# Patient Record
Sex: Male | Born: 1945 | Race: White | Hispanic: No | Marital: Married | Smoking: Never smoker
Health system: Southern US, Community
[De-identification: ages and names within clinical notes are randomized; demographics above are authoritative.]

## PROBLEM LIST (undated history)

## (undated) DIAGNOSIS — G4730 Sleep apnea, unspecified: Secondary | ICD-10-CM

## (undated) DIAGNOSIS — J61 Pneumoconiosis due to asbestos and other mineral fibers: Secondary | ICD-10-CM

## (undated) DIAGNOSIS — I2510 Atherosclerotic heart disease of native coronary artery without angina pectoris: Secondary | ICD-10-CM

## (undated) DIAGNOSIS — M1990 Unspecified osteoarthritis, unspecified site: Secondary | ICD-10-CM

## (undated) DIAGNOSIS — I10 Essential (primary) hypertension: Secondary | ICD-10-CM

## (undated) DIAGNOSIS — N400 Enlarged prostate without lower urinary tract symptoms: Secondary | ICD-10-CM

## (undated) DIAGNOSIS — K219 Gastro-esophageal reflux disease without esophagitis: Secondary | ICD-10-CM

## (undated) DIAGNOSIS — C4359 Malignant melanoma of other part of trunk: Secondary | ICD-10-CM

## (undated) HISTORY — PX: KNEE CARTILAGE SURGERY: SHX688

## (undated) HISTORY — PX: MELANOMA EXCISION: SHX5266

## (undated) HISTORY — DX: Atherosclerotic heart disease of native coronary artery without angina pectoris: I25.10

## (undated) HISTORY — PX: ANTERIOR CERVICAL DECOMP/DISCECTOMY FUSION: SHX1161

## (undated) HISTORY — PX: KNEE ARTHROSCOPY: SHX127

## (undated) HISTORY — DX: Essential (primary) hypertension: I10

## (undated) HISTORY — DX: Benign prostatic hyperplasia without lower urinary tract symptoms: N40.0

## (undated) HISTORY — PX: SHOULDER OPEN ROTATOR CUFF REPAIR: SHX2407

---

## 2001-08-25 ENCOUNTER — Encounter: Payer: Self-pay | Admitting: Neurological Surgery

## 2001-08-25 ENCOUNTER — Ambulatory Visit (HOSPITAL_COMMUNITY): Admission: RE | Admit: 2001-08-25 | Discharge: 2001-08-25 | Payer: Self-pay | Admitting: Neurological Surgery

## 2002-01-30 ENCOUNTER — Encounter: Payer: Self-pay | Admitting: Neurological Surgery

## 2002-02-03 ENCOUNTER — Encounter: Payer: Self-pay | Admitting: Neurological Surgery

## 2002-02-03 ENCOUNTER — Inpatient Hospital Stay (HOSPITAL_COMMUNITY): Admission: RE | Admit: 2002-02-03 | Discharge: 2002-02-04 | Payer: Self-pay | Admitting: Neurological Surgery

## 2004-03-28 ENCOUNTER — Ambulatory Visit (HOSPITAL_COMMUNITY): Admission: RE | Admit: 2004-03-28 | Discharge: 2004-03-28 | Payer: Self-pay | Admitting: Family Medicine

## 2010-11-18 ENCOUNTER — Ambulatory Visit (HOSPITAL_COMMUNITY)
Admission: RE | Admit: 2010-11-18 | Discharge: 2010-11-18 | Payer: Self-pay | Source: Home / Self Care | Attending: Internal Medicine | Admitting: Internal Medicine

## 2010-12-02 ENCOUNTER — Ambulatory Visit (HOSPITAL_COMMUNITY)
Admission: RE | Admit: 2010-12-02 | Discharge: 2010-12-02 | Disposition: A | Discharge status: Home / Self Care | Payer: 59 | Source: Ambulatory Visit | Attending: Internal Medicine | Admitting: Internal Medicine

## 2010-12-02 DIAGNOSIS — R0989 Other specified symptoms and signs involving the circulatory and respiratory systems: Secondary | ICD-10-CM | POA: Insufficient documentation

## 2010-12-02 DIAGNOSIS — R0609 Other forms of dyspnea: Secondary | ICD-10-CM | POA: Insufficient documentation

## 2010-12-02 DIAGNOSIS — J61 Pneumoconiosis due to asbestos and other mineral fibers: Secondary | ICD-10-CM | POA: Insufficient documentation

## 2010-12-02 LAB — BLOOD GAS, ARTERIAL
Acid-Base Excess: 2.9 mmol/L — ABNORMAL HIGH (ref 0.0–2.0)
Bicarbonate: 27.3 mEq/L — ABNORMAL HIGH (ref 20.0–24.0)
O2 Saturation: 96.3 %
Patient temperature: 37
TCO2: 23.3 mmol/L (ref 0–100)
pCO2 arterial: 45.1 mmHg — ABNORMAL HIGH (ref 35.0–45.0)
pH, Arterial: 7.399 (ref 7.350–7.450)
pO2, Arterial: 83.4 mmHg (ref 80.0–100.0)

## 2010-12-30 ENCOUNTER — Ambulatory Visit (INDEPENDENT_AMBULATORY_CARE_PROVIDER_SITE_OTHER): Payer: 59 | Admitting: Urology

## 2010-12-30 DIAGNOSIS — E291 Testicular hypofunction: Secondary | ICD-10-CM

## 2010-12-30 DIAGNOSIS — N401 Enlarged prostate with lower urinary tract symptoms: Secondary | ICD-10-CM

## 2010-12-30 DIAGNOSIS — R609 Edema, unspecified: Secondary | ICD-10-CM

## 2011-02-17 ENCOUNTER — Ambulatory Visit (INDEPENDENT_AMBULATORY_CARE_PROVIDER_SITE_OTHER): Payer: 59 | Admitting: Urology

## 2011-02-17 DIAGNOSIS — N401 Enlarged prostate with lower urinary tract symptoms: Secondary | ICD-10-CM

## 2011-02-17 DIAGNOSIS — N529 Male erectile dysfunction, unspecified: Secondary | ICD-10-CM

## 2011-03-17 NOTE — Op Note (Signed)
[place]  Patient:    Devin [name], Devin [name] Visit Number: [id number] MRN: [id number]          Service Type: SUR Location: [facility] [id number] Attending Physician:  Jonne Ply Dictated by:   Stefani Dama, M.D. Proc. 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  OPERATION PERFORMED:  Anterior cervical diskectomy and arthrodesis, C3-4 and C4-5, structural allograft, Synthes fixation.  SURGEON:  Stefani Dama, M.D.  ASSISTANT:  Hewitt Shorts, M.D.  ANESTHESIA:  General endotracheal.  INDICATIONS FOR PROCEDURE:  The patient is a 65 year old individual who has had significant neck, shoulder and arm pain.  He has both Lhermittes phenomenon and dysesthetic sensation into the right shoulder and forearm.  He was found to have a soft disk herniation at C4-5 and severe spondylitic disease with bone ridge across the left side at C3-4.  He was advised regarding surgical decompression.  DESCRIPTION OF PROCEDURE:  The patient was brought to the operating room and placed on the table in supine position.  After smooth induction of general endotracheal anesthesia, he was placed in five pounds of halter traction.  The neck was prepped and draped with DuraPrep and then transverse incision was made in the superior, left-sided portion of the neck and carried down through the platysma.  The plane between the sternocleidomastoid and the strap muscles was dissected bluntly until the prevertebral space was reached.  The first identifiable disk space was noted to be that of C5-6 on a radiograph.  The dissection was then carried cephalad to expose C4-5 and C3-4.  The longus coli muscle was stripped on either side and the  self-retaining retractor caspar retractor was placed in the wound.  C3-4 was then opened ventrally removing some ventral osteophytic material.  The disk space was entered and a significant diskectomy was performed to the region of the posterior longitudinal ligament.  Here on the left side there was noted to be a large osteophytic growth both from the uncinate spur and from the inferior margin of the body of C3.  This was drilled down with a 2.3 mm dissecting tool and a high speed air drill.  On the right side a similar drilling was accomplished relieving an uncinate spur.  In the end, the posterior longitudinal ligament remained intact but the lateral recesses were widely dissected open. Hemostasis from some epidural veins which had poked through the lateral recesses was obtained with bipolar cautery and some small pledgets of Gelfoam soaked thrombin.  These were layer irrigated away and a 7 mm tricortical graft was placed with the cortical surface facing dorsally.  At C4-5 similar procedure was carried out; however, on the right side there was noted to be a fragment of disk that was herniated into the subligamentous space.  Removal of this disk fragment was accompanied by a significant epidural venous bleeding. This was controlled with pledgets of Gelfoam soaked in thrombin, again later irrigated away.  In the end decompression was ____________ to both lateral extremes.  7 mm tricuspid regurgitation cortical graft was placed in a similar fashion with a cortical surface facing dorsally.  The wound was irrigated copiously.  Traction was removed. The  neck was placed in slight flexion and then a 40 mm standard Synthes plate was contoured to the vertebral bodies anteriorly and affixed with six locking 4 x 14 mm screws.  Confirmation of the position of the plate and screws was obtained radiographically.  The wound was then irrigated copiously again and hemostasis in the soft tissues  was meticulously obtained.  The platysma was closed with 3-0 Vicryl in interrupted fashion.  3-0 Vicryl was used to close the subcuticular tissues.  The patient tolerated the procedure well and was returned to the recovery room in stable condition.Dictated by:   Stefani Dama, M.D. Attending Physician:  Jonne Ply DD:  02/03/02 TD:  02/03/02 Job: [id number]

## 2011-05-19 ENCOUNTER — Ambulatory Visit (INDEPENDENT_AMBULATORY_CARE_PROVIDER_SITE_OTHER): Payer: 59 | Admitting: Urology

## 2011-05-19 DIAGNOSIS — N529 Male erectile dysfunction, unspecified: Secondary | ICD-10-CM

## 2011-05-19 DIAGNOSIS — N401 Enlarged prostate with lower urinary tract symptoms: Secondary | ICD-10-CM

## 2011-05-19 DIAGNOSIS — E291 Testicular hypofunction: Secondary | ICD-10-CM

## 2011-08-25 ENCOUNTER — Ambulatory Visit (INDEPENDENT_AMBULATORY_CARE_PROVIDER_SITE_OTHER): Payer: Medicare Other | Admitting: Urology

## 2011-08-25 DIAGNOSIS — N401 Enlarged prostate with lower urinary tract symptoms: Secondary | ICD-10-CM

## 2011-11-24 ENCOUNTER — Ambulatory Visit (INDEPENDENT_AMBULATORY_CARE_PROVIDER_SITE_OTHER): Payer: Medicare Other | Admitting: Urology

## 2011-11-24 DIAGNOSIS — E291 Testicular hypofunction: Secondary | ICD-10-CM | POA: Diagnosis not present

## 2011-11-24 DIAGNOSIS — R609 Edema, unspecified: Secondary | ICD-10-CM | POA: Diagnosis not present

## 2011-11-24 DIAGNOSIS — N401 Enlarged prostate with lower urinary tract symptoms: Secondary | ICD-10-CM | POA: Diagnosis not present

## 2011-11-24 DIAGNOSIS — N529 Male erectile dysfunction, unspecified: Secondary | ICD-10-CM | POA: Diagnosis not present

## 2011-12-08 DIAGNOSIS — E291 Testicular hypofunction: Secondary | ICD-10-CM | POA: Diagnosis not present

## 2011-12-22 DIAGNOSIS — E291 Testicular hypofunction: Secondary | ICD-10-CM | POA: Diagnosis not present

## 2012-01-05 DIAGNOSIS — E291 Testicular hypofunction: Secondary | ICD-10-CM | POA: Diagnosis not present

## 2012-01-19 DIAGNOSIS — E291 Testicular hypofunction: Secondary | ICD-10-CM | POA: Diagnosis not present

## 2012-02-01 DIAGNOSIS — I10 Essential (primary) hypertension: Secondary | ICD-10-CM | POA: Diagnosis not present

## 2012-02-01 DIAGNOSIS — G5600 Carpal tunnel syndrome, unspecified upper limb: Secondary | ICD-10-CM | POA: Diagnosis not present

## 2012-02-09 DIAGNOSIS — E291 Testicular hypofunction: Secondary | ICD-10-CM | POA: Diagnosis not present

## 2012-02-23 DIAGNOSIS — E291 Testicular hypofunction: Secondary | ICD-10-CM | POA: Diagnosis not present

## 2012-03-04 DIAGNOSIS — J209 Acute bronchitis, unspecified: Secondary | ICD-10-CM | POA: Diagnosis not present

## 2012-03-04 DIAGNOSIS — R05 Cough: Secondary | ICD-10-CM | POA: Diagnosis not present

## 2012-03-04 DIAGNOSIS — Z6839 Body mass index (BMI) 39.0-39.9, adult: Secondary | ICD-10-CM | POA: Diagnosis not present

## 2012-03-04 DIAGNOSIS — Z719 Counseling, unspecified: Secondary | ICD-10-CM | POA: Diagnosis not present

## 2012-03-08 DIAGNOSIS — E291 Testicular hypofunction: Secondary | ICD-10-CM | POA: Diagnosis not present

## 2012-03-15 DIAGNOSIS — Z79899 Other long term (current) drug therapy: Secondary | ICD-10-CM | POA: Diagnosis not present

## 2012-03-15 DIAGNOSIS — I10 Essential (primary) hypertension: Secondary | ICD-10-CM | POA: Diagnosis not present

## 2012-03-20 DIAGNOSIS — I10 Essential (primary) hypertension: Secondary | ICD-10-CM | POA: Diagnosis not present

## 2012-03-22 DIAGNOSIS — E290 Testicular hyperfunction: Secondary | ICD-10-CM | POA: Diagnosis not present

## 2012-04-05 DIAGNOSIS — E291 Testicular hypofunction: Secondary | ICD-10-CM | POA: Diagnosis not present

## 2012-04-29 DIAGNOSIS — N529 Male erectile dysfunction, unspecified: Secondary | ICD-10-CM | POA: Diagnosis not present

## 2012-05-10 DIAGNOSIS — N529 Male erectile dysfunction, unspecified: Secondary | ICD-10-CM | POA: Diagnosis not present

## 2012-05-15 DIAGNOSIS — E291 Testicular hypofunction: Secondary | ICD-10-CM | POA: Diagnosis not present

## 2012-05-24 ENCOUNTER — Ambulatory Visit (INDEPENDENT_AMBULATORY_CARE_PROVIDER_SITE_OTHER): Payer: Medicare Other | Admitting: Urology

## 2012-05-24 DIAGNOSIS — N401 Enlarged prostate with lower urinary tract symptoms: Secondary | ICD-10-CM

## 2012-05-24 DIAGNOSIS — E291 Testicular hypofunction: Secondary | ICD-10-CM

## 2012-05-24 DIAGNOSIS — N529 Male erectile dysfunction, unspecified: Secondary | ICD-10-CM | POA: Diagnosis not present

## 2012-05-31 DIAGNOSIS — N529 Male erectile dysfunction, unspecified: Secondary | ICD-10-CM | POA: Diagnosis not present

## 2012-06-17 DIAGNOSIS — N529 Male erectile dysfunction, unspecified: Secondary | ICD-10-CM | POA: Diagnosis not present

## 2012-07-02 DIAGNOSIS — N529 Male erectile dysfunction, unspecified: Secondary | ICD-10-CM | POA: Diagnosis not present

## 2012-07-16 DIAGNOSIS — N529 Male erectile dysfunction, unspecified: Secondary | ICD-10-CM | POA: Diagnosis not present

## 2012-07-25 DIAGNOSIS — I10 Essential (primary) hypertension: Secondary | ICD-10-CM | POA: Diagnosis not present

## 2012-07-25 DIAGNOSIS — J209 Acute bronchitis, unspecified: Secondary | ICD-10-CM | POA: Diagnosis not present

## 2012-07-25 DIAGNOSIS — Z23 Encounter for immunization: Secondary | ICD-10-CM | POA: Diagnosis not present

## 2012-07-30 DIAGNOSIS — N529 Male erectile dysfunction, unspecified: Secondary | ICD-10-CM | POA: Diagnosis not present

## 2012-08-21 DIAGNOSIS — N529 Male erectile dysfunction, unspecified: Secondary | ICD-10-CM | POA: Diagnosis not present

## 2012-09-04 DIAGNOSIS — Z79899 Other long term (current) drug therapy: Secondary | ICD-10-CM | POA: Diagnosis not present

## 2012-09-11 DIAGNOSIS — J209 Acute bronchitis, unspecified: Secondary | ICD-10-CM | POA: Diagnosis not present

## 2012-09-19 DIAGNOSIS — N529 Male erectile dysfunction, unspecified: Secondary | ICD-10-CM | POA: Diagnosis not present

## 2012-10-03 DIAGNOSIS — N529 Male erectile dysfunction, unspecified: Secondary | ICD-10-CM | POA: Diagnosis not present

## 2012-10-17 DIAGNOSIS — N529 Male erectile dysfunction, unspecified: Secondary | ICD-10-CM | POA: Diagnosis not present

## 2012-10-31 DIAGNOSIS — H66009 Acute suppurative otitis media without spontaneous rupture of ear drum, unspecified ear: Secondary | ICD-10-CM | POA: Diagnosis not present

## 2012-11-06 DIAGNOSIS — E291 Testicular hypofunction: Secondary | ICD-10-CM | POA: Diagnosis not present

## 2012-11-07 DIAGNOSIS — N529 Male erectile dysfunction, unspecified: Secondary | ICD-10-CM | POA: Diagnosis not present

## 2012-11-21 DIAGNOSIS — N529 Male erectile dysfunction, unspecified: Secondary | ICD-10-CM | POA: Diagnosis not present

## 2012-11-25 DIAGNOSIS — I10 Essential (primary) hypertension: Secondary | ICD-10-CM | POA: Diagnosis not present

## 2012-11-25 DIAGNOSIS — Z79899 Other long term (current) drug therapy: Secondary | ICD-10-CM | POA: Diagnosis not present

## 2012-11-25 DIAGNOSIS — E559 Vitamin D deficiency, unspecified: Secondary | ICD-10-CM | POA: Diagnosis not present

## 2012-12-02 DIAGNOSIS — I10 Essential (primary) hypertension: Secondary | ICD-10-CM | POA: Diagnosis not present

## 2012-12-02 DIAGNOSIS — G4700 Insomnia, unspecified: Secondary | ICD-10-CM | POA: Diagnosis not present

## 2012-12-05 DIAGNOSIS — N529 Male erectile dysfunction, unspecified: Secondary | ICD-10-CM | POA: Diagnosis not present

## 2012-12-19 DIAGNOSIS — N529 Male erectile dysfunction, unspecified: Secondary | ICD-10-CM | POA: Diagnosis not present

## 2012-12-27 ENCOUNTER — Ambulatory Visit (INDEPENDENT_AMBULATORY_CARE_PROVIDER_SITE_OTHER): Payer: Medicare Other | Admitting: Urology

## 2012-12-27 DIAGNOSIS — N529 Male erectile dysfunction, unspecified: Secondary | ICD-10-CM

## 2012-12-27 DIAGNOSIS — N401 Enlarged prostate with lower urinary tract symptoms: Secondary | ICD-10-CM | POA: Diagnosis not present

## 2012-12-27 DIAGNOSIS — E291 Testicular hypofunction: Secondary | ICD-10-CM

## 2013-01-02 DIAGNOSIS — N529 Male erectile dysfunction, unspecified: Secondary | ICD-10-CM | POA: Diagnosis not present

## 2013-01-06 DIAGNOSIS — N529 Male erectile dysfunction, unspecified: Secondary | ICD-10-CM | POA: Diagnosis not present

## 2013-01-20 DIAGNOSIS — N529 Male erectile dysfunction, unspecified: Secondary | ICD-10-CM | POA: Diagnosis not present

## 2013-02-03 DIAGNOSIS — N529 Male erectile dysfunction, unspecified: Secondary | ICD-10-CM | POA: Diagnosis not present

## 2013-02-17 DIAGNOSIS — N529 Male erectile dysfunction, unspecified: Secondary | ICD-10-CM | POA: Diagnosis not present

## 2013-03-03 DIAGNOSIS — N529 Male erectile dysfunction, unspecified: Secondary | ICD-10-CM | POA: Diagnosis not present

## 2013-03-17 DIAGNOSIS — N529 Male erectile dysfunction, unspecified: Secondary | ICD-10-CM | POA: Diagnosis not present

## 2013-04-04 DIAGNOSIS — J209 Acute bronchitis, unspecified: Secondary | ICD-10-CM | POA: Diagnosis not present

## 2013-04-04 DIAGNOSIS — I10 Essential (primary) hypertension: Secondary | ICD-10-CM | POA: Diagnosis not present

## 2013-04-04 DIAGNOSIS — N529 Male erectile dysfunction, unspecified: Secondary | ICD-10-CM | POA: Diagnosis not present

## 2013-04-04 DIAGNOSIS — R609 Edema, unspecified: Secondary | ICD-10-CM | POA: Diagnosis not present

## 2013-04-18 DIAGNOSIS — N529 Male erectile dysfunction, unspecified: Secondary | ICD-10-CM | POA: Diagnosis not present

## 2013-05-26 DIAGNOSIS — N529 Male erectile dysfunction, unspecified: Secondary | ICD-10-CM | POA: Diagnosis not present

## 2013-06-09 DIAGNOSIS — N529 Male erectile dysfunction, unspecified: Secondary | ICD-10-CM | POA: Diagnosis not present

## 2013-06-13 DIAGNOSIS — N401 Enlarged prostate with lower urinary tract symptoms: Secondary | ICD-10-CM | POA: Diagnosis not present

## 2013-06-13 DIAGNOSIS — E291 Testicular hypofunction: Secondary | ICD-10-CM | POA: Diagnosis not present

## 2013-06-13 DIAGNOSIS — N529 Male erectile dysfunction, unspecified: Secondary | ICD-10-CM | POA: Diagnosis not present

## 2013-06-23 DIAGNOSIS — N401 Enlarged prostate with lower urinary tract symptoms: Secondary | ICD-10-CM | POA: Diagnosis not present

## 2013-06-23 DIAGNOSIS — N529 Male erectile dysfunction, unspecified: Secondary | ICD-10-CM | POA: Diagnosis not present

## 2013-06-27 ENCOUNTER — Ambulatory Visit (INDEPENDENT_AMBULATORY_CARE_PROVIDER_SITE_OTHER): Payer: Medicare Other | Admitting: Urology

## 2013-06-27 DIAGNOSIS — E291 Testicular hypofunction: Secondary | ICD-10-CM

## 2013-06-27 DIAGNOSIS — R6882 Decreased libido: Secondary | ICD-10-CM

## 2013-06-27 DIAGNOSIS — N529 Male erectile dysfunction, unspecified: Secondary | ICD-10-CM | POA: Diagnosis not present

## 2013-06-27 DIAGNOSIS — N401 Enlarged prostate with lower urinary tract symptoms: Secondary | ICD-10-CM | POA: Diagnosis not present

## 2013-07-07 DIAGNOSIS — N529 Male erectile dysfunction, unspecified: Secondary | ICD-10-CM | POA: Diagnosis not present

## 2013-07-21 DIAGNOSIS — N529 Male erectile dysfunction, unspecified: Secondary | ICD-10-CM | POA: Diagnosis not present

## 2013-08-08 DIAGNOSIS — N529 Male erectile dysfunction, unspecified: Secondary | ICD-10-CM | POA: Diagnosis not present

## 2013-08-08 DIAGNOSIS — I10 Essential (primary) hypertension: Secondary | ICD-10-CM | POA: Diagnosis not present

## 2013-08-08 DIAGNOSIS — R609 Edema, unspecified: Secondary | ICD-10-CM | POA: Diagnosis not present

## 2013-08-29 DIAGNOSIS — N529 Male erectile dysfunction, unspecified: Secondary | ICD-10-CM | POA: Diagnosis not present

## 2013-09-15 DIAGNOSIS — N529 Male erectile dysfunction, unspecified: Secondary | ICD-10-CM | POA: Diagnosis not present

## 2013-09-29 DIAGNOSIS — N529 Male erectile dysfunction, unspecified: Secondary | ICD-10-CM | POA: Diagnosis not present

## 2013-10-02 DIAGNOSIS — J441 Chronic obstructive pulmonary disease with (acute) exacerbation: Secondary | ICD-10-CM | POA: Diagnosis not present

## 2013-10-13 DIAGNOSIS — N529 Male erectile dysfunction, unspecified: Secondary | ICD-10-CM | POA: Diagnosis not present

## 2013-10-28 DIAGNOSIS — N529 Male erectile dysfunction, unspecified: Secondary | ICD-10-CM | POA: Diagnosis not present

## 2013-11-11 DIAGNOSIS — N529 Male erectile dysfunction, unspecified: Secondary | ICD-10-CM | POA: Diagnosis not present

## 2013-11-25 DIAGNOSIS — N529 Male erectile dysfunction, unspecified: Secondary | ICD-10-CM | POA: Diagnosis not present

## 2013-12-05 DIAGNOSIS — Z79899 Other long term (current) drug therapy: Secondary | ICD-10-CM | POA: Diagnosis not present

## 2013-12-05 DIAGNOSIS — E785 Hyperlipidemia, unspecified: Secondary | ICD-10-CM | POA: Diagnosis not present

## 2013-12-05 DIAGNOSIS — I10 Essential (primary) hypertension: Secondary | ICD-10-CM | POA: Diagnosis not present

## 2013-12-09 DIAGNOSIS — N529 Male erectile dysfunction, unspecified: Secondary | ICD-10-CM | POA: Diagnosis not present

## 2013-12-12 DIAGNOSIS — I10 Essential (primary) hypertension: Secondary | ICD-10-CM | POA: Diagnosis not present

## 2013-12-12 DIAGNOSIS — E785 Hyperlipidemia, unspecified: Secondary | ICD-10-CM | POA: Diagnosis not present

## 2013-12-12 DIAGNOSIS — J209 Acute bronchitis, unspecified: Secondary | ICD-10-CM | POA: Diagnosis not present

## 2013-12-23 DIAGNOSIS — N529 Male erectile dysfunction, unspecified: Secondary | ICD-10-CM | POA: Diagnosis not present

## 2014-01-08 DIAGNOSIS — N529 Male erectile dysfunction, unspecified: Secondary | ICD-10-CM | POA: Diagnosis not present

## 2014-01-22 DIAGNOSIS — N529 Male erectile dysfunction, unspecified: Secondary | ICD-10-CM | POA: Diagnosis not present

## 2014-02-05 DIAGNOSIS — N529 Male erectile dysfunction, unspecified: Secondary | ICD-10-CM | POA: Diagnosis not present

## 2014-02-23 DIAGNOSIS — N529 Male erectile dysfunction, unspecified: Secondary | ICD-10-CM | POA: Diagnosis not present

## 2014-03-16 DIAGNOSIS — N529 Male erectile dysfunction, unspecified: Secondary | ICD-10-CM | POA: Diagnosis not present

## 2014-03-30 DIAGNOSIS — N529 Male erectile dysfunction, unspecified: Secondary | ICD-10-CM | POA: Diagnosis not present

## 2014-04-13 DIAGNOSIS — N529 Male erectile dysfunction, unspecified: Secondary | ICD-10-CM | POA: Diagnosis not present

## 2014-04-14 DIAGNOSIS — J61 Pneumoconiosis due to asbestos and other mineral fibers: Secondary | ICD-10-CM | POA: Diagnosis not present

## 2014-04-14 DIAGNOSIS — I10 Essential (primary) hypertension: Secondary | ICD-10-CM | POA: Diagnosis not present

## 2014-04-14 DIAGNOSIS — Z23 Encounter for immunization: Secondary | ICD-10-CM | POA: Diagnosis not present

## 2014-04-27 DIAGNOSIS — N529 Male erectile dysfunction, unspecified: Secondary | ICD-10-CM | POA: Diagnosis not present

## 2014-05-12 DIAGNOSIS — N529 Male erectile dysfunction, unspecified: Secondary | ICD-10-CM | POA: Diagnosis not present

## 2014-05-26 DIAGNOSIS — N529 Male erectile dysfunction, unspecified: Secondary | ICD-10-CM | POA: Diagnosis not present

## 2014-06-09 DIAGNOSIS — R972 Elevated prostate specific antigen [PSA]: Secondary | ICD-10-CM | POA: Diagnosis not present

## 2014-06-09 DIAGNOSIS — E291 Testicular hypofunction: Secondary | ICD-10-CM | POA: Diagnosis not present

## 2014-06-09 DIAGNOSIS — N529 Male erectile dysfunction, unspecified: Secondary | ICD-10-CM | POA: Diagnosis not present

## 2014-06-12 ENCOUNTER — Ambulatory Visit (INDEPENDENT_AMBULATORY_CARE_PROVIDER_SITE_OTHER): Payer: Medicare Other | Admitting: Urology

## 2014-06-12 DIAGNOSIS — E291 Testicular hypofunction: Secondary | ICD-10-CM

## 2014-06-12 DIAGNOSIS — N529 Male erectile dysfunction, unspecified: Secondary | ICD-10-CM

## 2014-06-12 DIAGNOSIS — N401 Enlarged prostate with lower urinary tract symptoms: Secondary | ICD-10-CM

## 2014-06-23 DIAGNOSIS — N529 Male erectile dysfunction, unspecified: Secondary | ICD-10-CM | POA: Diagnosis not present

## 2014-07-07 DIAGNOSIS — N529 Male erectile dysfunction, unspecified: Secondary | ICD-10-CM | POA: Diagnosis not present

## 2014-07-21 DIAGNOSIS — N529 Male erectile dysfunction, unspecified: Secondary | ICD-10-CM | POA: Diagnosis not present

## 2014-08-10 DIAGNOSIS — N529 Male erectile dysfunction, unspecified: Secondary | ICD-10-CM | POA: Diagnosis not present

## 2014-08-10 DIAGNOSIS — J209 Acute bronchitis, unspecified: Secondary | ICD-10-CM | POA: Diagnosis not present

## 2014-08-10 DIAGNOSIS — Z23 Encounter for immunization: Secondary | ICD-10-CM | POA: Diagnosis not present

## 2014-08-14 ENCOUNTER — Ambulatory Visit: Payer: Medicare Other | Admitting: Urology

## 2014-08-21 DIAGNOSIS — I10 Essential (primary) hypertension: Secondary | ICD-10-CM | POA: Diagnosis not present

## 2014-08-24 DIAGNOSIS — N529 Male erectile dysfunction, unspecified: Secondary | ICD-10-CM | POA: Diagnosis not present

## 2014-09-10 DIAGNOSIS — N529 Male erectile dysfunction, unspecified: Secondary | ICD-10-CM | POA: Diagnosis not present

## 2014-09-28 DIAGNOSIS — N529 Male erectile dysfunction, unspecified: Secondary | ICD-10-CM | POA: Diagnosis not present

## 2014-10-02 DIAGNOSIS — E291 Testicular hypofunction: Secondary | ICD-10-CM | POA: Diagnosis not present

## 2014-10-09 ENCOUNTER — Ambulatory Visit (INDEPENDENT_AMBULATORY_CARE_PROVIDER_SITE_OTHER): Payer: Medicare Other | Admitting: Urology

## 2014-10-09 DIAGNOSIS — N5201 Erectile dysfunction due to arterial insufficiency: Secondary | ICD-10-CM | POA: Diagnosis not present

## 2014-10-09 DIAGNOSIS — N401 Enlarged prostate with lower urinary tract symptoms: Secondary | ICD-10-CM

## 2014-10-09 DIAGNOSIS — E291 Testicular hypofunction: Secondary | ICD-10-CM

## 2014-10-09 DIAGNOSIS — R3912 Poor urinary stream: Secondary | ICD-10-CM

## 2014-10-12 DIAGNOSIS — N529 Male erectile dysfunction, unspecified: Secondary | ICD-10-CM | POA: Diagnosis not present

## 2014-10-26 DIAGNOSIS — N529 Male erectile dysfunction, unspecified: Secondary | ICD-10-CM | POA: Diagnosis not present

## 2014-11-09 DIAGNOSIS — N529 Male erectile dysfunction, unspecified: Secondary | ICD-10-CM | POA: Diagnosis not present

## 2014-11-23 DIAGNOSIS — N529 Male erectile dysfunction, unspecified: Secondary | ICD-10-CM | POA: Diagnosis not present

## 2014-12-07 DIAGNOSIS — N529 Male erectile dysfunction, unspecified: Secondary | ICD-10-CM | POA: Diagnosis not present

## 2014-12-21 DIAGNOSIS — N529 Male erectile dysfunction, unspecified: Secondary | ICD-10-CM | POA: Diagnosis not present

## 2014-12-25 ENCOUNTER — Inpatient Hospital Stay (HOSPITAL_COMMUNITY)
Admission: EM | Admit: 2014-12-25 | Discharge: 2015-01-03 | DRG: 234 | Disposition: A | Discharge status: Home / Self Care | Payer: Medicare Other | Attending: Cardiothoracic Surgery | Admitting: Cardiothoracic Surgery

## 2014-12-25 ENCOUNTER — Encounter (HOSPITAL_COMMUNITY): Payer: Self-pay | Admitting: Emergency Medicine

## 2014-12-25 ENCOUNTER — Emergency Department (HOSPITAL_COMMUNITY): Payer: Medicare Other

## 2014-12-25 DIAGNOSIS — E8770 Fluid overload, unspecified: Secondary | ICD-10-CM | POA: Diagnosis present

## 2014-12-25 DIAGNOSIS — I2111 ST elevation (STEMI) myocardial infarction involving right coronary artery: Secondary | ICD-10-CM | POA: Diagnosis not present

## 2014-12-25 DIAGNOSIS — Z951 Presence of aortocoronary bypass graft: Secondary | ICD-10-CM

## 2014-12-25 DIAGNOSIS — I517 Cardiomegaly: Secondary | ICD-10-CM | POA: Diagnosis not present

## 2014-12-25 DIAGNOSIS — J90 Pleural effusion, not elsewhere classified: Secondary | ICD-10-CM | POA: Diagnosis not present

## 2014-12-25 DIAGNOSIS — E785 Hyperlipidemia, unspecified: Secondary | ICD-10-CM | POA: Diagnosis present

## 2014-12-25 DIAGNOSIS — M1990 Unspecified osteoarthritis, unspecified site: Secondary | ICD-10-CM | POA: Diagnosis present

## 2014-12-25 DIAGNOSIS — G4730 Sleep apnea, unspecified: Secondary | ICD-10-CM | POA: Diagnosis present

## 2014-12-25 DIAGNOSIS — Z79899 Other long term (current) drug therapy: Secondary | ICD-10-CM

## 2014-12-25 DIAGNOSIS — Z981 Arthrodesis status: Secondary | ICD-10-CM

## 2014-12-25 DIAGNOSIS — Z77090 Contact with and (suspected) exposure to asbestos: Secondary | ICD-10-CM | POA: Diagnosis present

## 2014-12-25 DIAGNOSIS — I214 Non-ST elevation (NSTEMI) myocardial infarction: Secondary | ICD-10-CM | POA: Diagnosis not present

## 2014-12-25 DIAGNOSIS — I4510 Unspecified right bundle-branch block: Secondary | ICD-10-CM | POA: Diagnosis present

## 2014-12-25 DIAGNOSIS — L259 Unspecified contact dermatitis, unspecified cause: Secondary | ICD-10-CM | POA: Diagnosis not present

## 2014-12-25 DIAGNOSIS — I25110 Atherosclerotic heart disease of native coronary artery with unstable angina pectoris: Secondary | ICD-10-CM | POA: Diagnosis present

## 2014-12-25 DIAGNOSIS — E669 Obesity, unspecified: Secondary | ICD-10-CM | POA: Diagnosis present

## 2014-12-25 DIAGNOSIS — I10 Essential (primary) hypertension: Secondary | ICD-10-CM | POA: Diagnosis present

## 2014-12-25 DIAGNOSIS — K219 Gastro-esophageal reflux disease without esophagitis: Secondary | ICD-10-CM | POA: Diagnosis present

## 2014-12-25 DIAGNOSIS — I2510 Atherosclerotic heart disease of native coronary artery without angina pectoris: Secondary | ICD-10-CM | POA: Diagnosis not present

## 2014-12-25 DIAGNOSIS — R079 Chest pain, unspecified: Secondary | ICD-10-CM | POA: Diagnosis not present

## 2014-12-25 DIAGNOSIS — J984 Other disorders of lung: Secondary | ICD-10-CM | POA: Diagnosis present

## 2014-12-25 DIAGNOSIS — Z4682 Encounter for fitting and adjustment of non-vascular catheter: Secondary | ICD-10-CM | POA: Diagnosis not present

## 2014-12-25 DIAGNOSIS — R0902 Hypoxemia: Secondary | ICD-10-CM | POA: Diagnosis not present

## 2014-12-25 DIAGNOSIS — R0989 Other specified symptoms and signs involving the circulatory and respiratory systems: Secondary | ICD-10-CM | POA: Diagnosis not present

## 2014-12-25 DIAGNOSIS — J9811 Atelectasis: Secondary | ICD-10-CM | POA: Diagnosis not present

## 2014-12-25 DIAGNOSIS — R072 Precordial pain: Secondary | ICD-10-CM | POA: Diagnosis not present

## 2014-12-25 DIAGNOSIS — I200 Unstable angina: Secondary | ICD-10-CM | POA: Diagnosis not present

## 2014-12-25 HISTORY — DX: Pneumoconiosis due to asbestos and other mineral fibers: J61

## 2014-12-25 HISTORY — DX: Gastro-esophageal reflux disease without esophagitis: K21.9

## 2014-12-25 HISTORY — DX: Sleep apnea, unspecified: G47.30

## 2014-12-25 HISTORY — DX: Malignant melanoma of other part of trunk: C43.59

## 2014-12-25 HISTORY — DX: Unspecified osteoarthritis, unspecified site: M19.90

## 2014-12-25 LAB — CBC WITH DIFFERENTIAL/PLATELET
BASOS PCT: 0 % (ref 0–1)
Basophils Absolute: 0 10*3/uL (ref 0.0–0.1)
EOS PCT: 3 % (ref 0–5)
Eosinophils Absolute: 0.2 10*3/uL (ref 0.0–0.7)
HCT: 47.9 % (ref 39.0–52.0)
Hemoglobin: 15.7 g/dL (ref 13.0–17.0)
LYMPHS ABS: 1.2 10*3/uL (ref 0.7–4.0)
Lymphocytes Relative: 24 % (ref 12–46)
MCH: 31.9 pg (ref 26.0–34.0)
MCHC: 32.8 g/dL (ref 30.0–36.0)
MCV: 97.4 fL (ref 78.0–100.0)
MONOS PCT: 8 % (ref 3–12)
Monocytes Absolute: 0.4 10*3/uL (ref 0.1–1.0)
Neutro Abs: 3.4 10*3/uL (ref 1.7–7.7)
Neutrophils Relative %: 65 % (ref 43–77)
Platelets: 157 10*3/uL (ref 150–400)
RBC: 4.92 MIL/uL (ref 4.22–5.81)
RDW: 13.5 % (ref 11.5–15.5)
WBC: 5.2 10*3/uL (ref 4.0–10.5)

## 2014-12-25 LAB — COMPREHENSIVE METABOLIC PANEL
ALT: 19 U/L (ref 0–53)
ANION GAP: 3 — AB (ref 5–15)
AST: 18 U/L (ref 0–37)
Albumin: 4.2 g/dL (ref 3.5–5.2)
Alkaline Phosphatase: 51 U/L (ref 39–117)
BUN: 16 mg/dL (ref 6–23)
CALCIUM: 8.9 mg/dL (ref 8.4–10.5)
CO2: 29 mmol/L (ref 19–32)
CREATININE: 1.01 mg/dL (ref 0.50–1.35)
Chloride: 105 mmol/L (ref 96–112)
GFR, EST AFRICAN AMERICAN: 86 mL/min — AB (ref >90)
GFR, EST NON AFRICAN AMERICAN: 74 mL/min — AB (ref >90)
GLUCOSE: 100 mg/dL — AB (ref 70–99)
Potassium: 3.9 mmol/L (ref 3.5–5.1)
Sodium: 137 mmol/L (ref 135–145)
TOTAL PROTEIN: 7.1 g/dL (ref 6.0–8.3)
Total Bilirubin: 0.7 mg/dL (ref 0.3–1.2)

## 2014-12-25 LAB — URINALYSIS, ROUTINE W REFLEX MICROSCOPIC
BILIRUBIN URINE: NEGATIVE
Glucose, UA: NEGATIVE mg/dL
Hgb urine dipstick: NEGATIVE
KETONES UR: NEGATIVE mg/dL
Leukocytes, UA: NEGATIVE
NITRITE: NEGATIVE
PH: 7.5 (ref 5.0–8.0)
Protein, ur: NEGATIVE mg/dL
Specific Gravity, Urine: 1.02 (ref 1.005–1.030)
Urobilinogen, UA: 0.2 mg/dL (ref 0.0–1.0)

## 2014-12-25 LAB — TROPONIN I
TROPONIN I: 0.07 ng/mL — AB (ref <0.031)
TROPONIN I: 0.28 ng/mL — AB (ref <0.031)
Troponin I: 1.16 ng/mL (ref <0.031)

## 2014-12-25 LAB — PROTIME-INR
INR: 1.01 (ref 0.00–1.49)
PROTHROMBIN TIME: 13.4 s (ref 11.6–15.2)

## 2014-12-25 LAB — APTT: APTT: 25 s (ref 24–37)

## 2014-12-25 LAB — D-DIMER, QUANTITATIVE: D-Dimer, Quant: 0.41 ug/mL-FEU (ref 0.00–0.48)

## 2014-12-25 MED ORDER — NITROGLYCERIN IN D5W 200-5 MCG/ML-% IV SOLN
5.0000 ug/min | Freq: Once | INTRAVENOUS | Status: DC
  Filled 2014-12-25: qty 250

## 2014-12-25 MED ORDER — HEPARIN (PORCINE) IN NACL 100-0.45 UNIT/ML-% IJ SOLN
1700.0000 [IU]/h | INTRAMUSCULAR | Status: DC
  Administered 2014-12-26: 1700 [IU]/h via INTRAVENOUS
  Administered 2014-12-26: 1250 [IU]/h via INTRAVENOUS
  Filled 2014-12-25 (×2): qty 250

## 2014-12-25 MED ORDER — ASPIRIN 81 MG PO CHEW
324.0000 mg | CHEWABLE_TABLET | Freq: Once | ORAL | Status: AC
  Administered 2014-12-25: 324 mg via ORAL
  Filled 2014-12-25: qty 4

## 2014-12-25 MED ORDER — NITROGLYCERIN 0.4 MG SL SUBL
0.4000 mg | SUBLINGUAL_TABLET | SUBLINGUAL | Status: DC | PRN
  Administered 2014-12-25: 0.4 mg via SUBLINGUAL
  Filled 2014-12-25: qty 1

## 2014-12-25 MED ORDER — HEPARIN BOLUS VIA INFUSION
4000.0000 [IU] | Freq: Once | INTRAVENOUS | Status: AC
  Administered 2014-12-25: 4000 [IU] via INTRAVENOUS

## 2014-12-25 NOTE — ED Notes (Signed)
Notifed carellink and EDP of troponin 1.16 prior to pt leaving for [facility].

## 2014-12-25 NOTE — ED Notes (Signed)
EDP back in to re eval

## 2014-12-25 NOTE — ED Notes (Signed)
Pt reports cp since last night. Pt reports intermittent nausea. nad noted. Pt reports sob at baseline. Mild dyspnea noted with exertion.

## 2014-12-25 NOTE — ED Provider Notes (Signed)
CSN: [id number]     Arrival date & time 12/25/14  1521 History   First MD Initiated Contact with Patient 12/25/14 1539     Chief Complaint  Patient presents with  . Chest Pain     (Consider location/radiation/quality/duration/timing/severity/associated sxs/prior Treatment) HPI Comments: History reports intermittent substernal chest pain since last night. It radiates to bilateral shoulders and neck. Associated with nausea and shortness of breath. 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 Episode last night improved with sitting up and worse with laying down. Last about one hour. Patient thought it was indigestion. Today had another episode while working on his tractor and exerting himself with worsening dizziness, lightheadedness, nausea and shortness of breath. Symptoms improved after about one hour. Reports negative stress test last in 2009. Denies previous coronary artery disease. Denies diabetes. History of hypertension, enlarged prostate and anxiety. No smoking. Pain has improved upon arrival to the ED.  The history is provided by the patient and the spouse.    Past Medical History  Diagnosis Date  . Hypertension   . Anxiety   . Enlarged prostate    Past Surgical History  Procedure Laterality Date  . Neck surgery    . Shoulder surgery    . Knee surgery Bilateral    History reviewed. No pertinent family history. History  Substance Use Topics  . Smoking status: Never Smoker   . Smokeless tobacco: Not on file  . Alcohol Use: No    Review of Systems  Constitutional: Negative for fever, activity change and appetite change.  HENT: Negative for congestion and rhinorrhea.   Eyes: Negative for visual disturbance.  Respiratory: Positive for chest tightness and shortness of breath.   Cardiovascular: Positive for chest pain.  Gastrointestinal: Positive for nausea. Negative for vomiting and abdominal pain.  Genitourinary: Negative for dysuria and  hematuria.  Musculoskeletal: Negative for myalgias and arthralgias.  Skin: Negative for wound.  Neurological: Positive for dizziness and light-headedness. Negative for weakness and headaches.  A complete 10 system review of systems was obtained and all systems are negative except as noted in the HPI and PMH.      Allergies  Review of patient's allergies indicates no known allergies.  Home Medications   Prior to Admission medications   Medication Sig Start Date End Date Taking? Authorizing Provider  albuterol (PROVENTIL HFA;VENTOLIN HFA) 108 (90 BASE) MCG/ACT inhaler Inhale 1-2 puffs into the lungs every 6 (six) hours as needed for wheezing or shortness of breath.   Yes Historical Provider, MD  amLODipine (NORVASC) 5 MG tablet Take 5 mg by mouth every evening. 11/12/14  Yes Historical Provider, MD  clonazePAM (KLONOPIN) 0.5 MG tablet Take 0.5 mg by mouth at bedtime. 11/12/14  Yes Historical Provider, MD  doxazosin (CARDURA) 4 MG tablet Take 4 mg by mouth every morning.   Yes Historical Provider, MD  losartan (COZAAR) 100 MG tablet Take 100 mg by mouth every evening. 11/14/14  Yes Historical Provider, MD  naproxen sodium (ANAPROX) 220 MG tablet Take 220 mg by mouth daily as needed (FOR PAIN).   Yes Historical Provider, MD  Testosterone Cypionate 200 MG/ML KIT Inject 400 mg into the muscle every 14 (fourteen) days.   Yes Historical Provider, MD   BP 173/98 mmHg  Pulse 88  Temp(Src) 98 F (36.7 C) (Oral)  Resp 15  Ht 6' (1.829 m)  Wt 270 lb (122.471 kg)  BMI 36.61 kg/m2  SpO2 95% Physical Exam  Constitutional: He is oriented to  person, place, and time. He appears well-developed and well-nourished. No distress.  HENT:  Head: Normocephalic and atraumatic.  Mouth/Throat: Oropharynx is clear and moist. No oropharyngeal exudate.  Eyes: Conjunctivae and EOM are normal. Pupils are equal, round, and reactive to light.  Neck: Normal range of motion. Neck supple.  No meningismus.   Cardiovascular: Normal rate, regular rhythm, normal heart sounds and intact distal pulses.   No murmur heard. Pulmonary/Chest: Effort normal and breath sounds normal. No respiratory distress.  Abdominal: Soft. There is no tenderness. There is no rebound and no guarding.  Musculoskeletal: Normal range of motion. He exhibits no edema or tenderness.  Neurological: He is alert and oriented to person, place, and time. No cranial nerve deficit. He exhibits normal muscle tone. Coordination normal.  No ataxia on finger to nose bilaterally. No pronator drift. 5/5 strength throughout. CN 2-12 intact. Negative Romberg. Equal grip strength. Sensation intact. Gait is normal.   Skin: Skin is warm.  Psychiatric: He has a normal mood and affect. His behavior is normal.  Nursing note and vitals reviewed.   ED Course  Procedures (including critical care time) Labs Review Labs Reviewed  COMPREHENSIVE METABOLIC PANEL - Abnormal; Notable for the following:    Glucose, Bld 100 (*)    GFR calc non Af Amer 74 (*)    GFR calc Af Amer 86 (*)    Anion gap 3 (*)    All other components within normal limits  TROPONIN I - Abnormal; Notable for the following:    Troponin I 0.07 (*)    All other components within normal limits  TROPONIN I - Abnormal; Notable for the following:    Troponin I 0.28 (*)    All other components within normal limits  CBC WITH DIFFERENTIAL/PLATELET  URINALYSIS, ROUTINE W REFLEX MICROSCOPIC  APTT  PROTIME-INR  D-DIMER, QUANTITATIVE  HEPARIN LEVEL (UNFRACTIONATED)  HEPARIN LEVEL (UNFRACTIONATED)  CBC    Imaging Review Dg Chest 2 View  12/25/2014   CLINICAL DATA:  Chest pain cough and congestion since last night  EXAM: CHEST  2 VIEW  COMPARISON:  CT scan of the chest of November 18, 2010  FINDINGS: The lungs are adequately inflated. The interstitial markings are increased bilaterally. There is no alveolar infiltrate or pleural effusion. The heart and pulmonary vascularity are normal.  The mediastinum is normal in width. The bony thorax exhibits no acute abnormality. There are degenerative changes of the left shoulder.  IMPRESSION: Increased pulmonary interstitial markings bilaterally. Given the patient's acute symptoms in this may reflect pneumonitis. Follow-up radiographs following anticipated antibiotic therapy are recommended to assure clearing.   Electronically Signed   By: David  Martinique   On: 12/25/2014 16:40     EKG Interpretation   Date/Time:  Friday December 25 2014 15:35:36 EST Ventricular Rate:  95 PR Interval:  174 QRS Duration: 148 QT Interval:  370 QTC Calculation: 465 R Axis:   -5 Text Interpretation:  Sinus rhythm Probable left atrial enlargement Right  bundle branch block Baseline wander in lead(s) II aVR V3 new RBBB  Confirmed by Wyvonnia Dusky  MD, Joshuwa Vecchio ([id number]) on 12/25/2014 3:58:34 PM      MDM   Final diagnoses:  NSTEMI (non-ST elevated myocardial infarction)   Intermittent chest pain since last night with SOB, nausea, dizziness.  Exertional component.  EKG with new right bundle branch block. Patient will be given aspirin, labs obtained, chest x-ray  Troponin 0.07. Patient already received aspirin. Will start heparin drip.  discuss with cardiology.  D/w Dr. Haroldine Laws who recommend d-dimer and repeat troponin and calling cardiology back.  Second troponin 0.28. D-dimer negative. Patient reports this chest tightness has improved.  Discussed with cardiology again. Patient will need transfer to [facility]. D/w Dr. Bronson Ing who accepts patient.  CRITICAL CARE Performed by: Ezequiel Essex Total critical care time: 35 Critical care time was exclusive of separately billable procedures and treating other patients. Critical care was necessary to treat or prevent imminent or life-threatening deterioration. Critical care was time spent personally by me on the following activities: development of treatment plan with patient and/or surrogate  as well as nursing, discussions with consultants, evaluation of patient's response to treatment, examination of patient, obtaining history from patient or surrogate, ordering and performing treatments and interventions, ordering and review of laboratory studies, ordering and review of radiographic studies, pulse oximetry and re-evaluation of patient's condition.     Ezequiel Essex, MD 12/25/14 [phone]

## 2014-12-25 NOTE — ED Notes (Signed)
EDP at bedside  

## 2014-12-25 NOTE — Progress Notes (Signed)
ANTICOAGULATION CONSULT NOTE - Initial Consult  Pharmacy Consult for Heparin Indication: chest pain/ACS  No Known Allergies  Patient Measurements: Height: 6' (182.9 cm) Weight: 270 lb (122.471 kg) IBW/kg (Calculated) : 77.6  Vital Signs: Temp: 98.5 F (36.9 C) (02/26 1537) Temp Source: Oral (02/26 1537) BP: 148/92 mmHg (02/26 1700) Pulse Rate: 83 (02/26 1700)  Labs:  Recent Labs  12/25/14 1543  HGB 15.7  HCT 47.9  PLT 157  CREATININE 1.01  TROPONINI 0.07*   Estimated Creatinine Clearance: 94.7 mL/min (by C-G formula based on Cr of 1.01).  Medical History: Past Medical History  Diagnosis Date  . Hypertension   . Anxiety   . Enlarged prostate    Assessment: 69yo morbidly obese male with c/o CP.  Asked to initiate Heparin for ACS.  Goal of Therapy:  Heparin level 0.3-0.7 units/ml w/in 24 hrs of initiation of Heparin Monitor platelets by anticoagulation protocol: Yes   Plan:  Heparin 4000 units IV bolus now Heparin infusion at 1250 units/hr Heparin level in 6-8 hrs then daily CBC daily while on Heparin  [place] Crane, Clella Mckeel A 12/25/2014,5:19 PM

## 2014-12-25 NOTE — ED Notes (Signed)
CRITICAL VALUE ALERT  Critical value received:  Trop 1.16  Date of notification:  12/25/14  Time of notification:  2318  Critical value read back:Yes.    Nurse who received alert:  Carolan Shiver  MD notified (1st page):  Lacinda Axon  Time of first page:  2318  MD notified (2nd page):  Time of second page:  Responding MD:  Lacinda Axon  Time MD responded:  2318

## 2014-12-25 NOTE — ED Notes (Signed)
Pt sitting on the side of bed. States he can breath better like that

## 2014-12-25 NOTE — ED Notes (Signed)
Pt ambulated to bathroom for BM against nurse advise. Pt refused to use bedpan or bedside comode. Pt stated he would "disconnect" himself from monitors and IV's if nurse did not do it for him to ambulate to the bathroom. Informed pt of risks and he was disconnected to ambulate to bathroom.  Pt then returned to room and was reconnected to monitors.

## 2014-12-26 ENCOUNTER — Encounter (HOSPITAL_COMMUNITY): Payer: Self-pay | Admitting: General Practice

## 2014-12-26 DIAGNOSIS — I200 Unstable angina: Secondary | ICD-10-CM

## 2014-12-26 DIAGNOSIS — I2510 Atherosclerotic heart disease of native coronary artery without angina pectoris: Secondary | ICD-10-CM | POA: Diagnosis present

## 2014-12-26 LAB — BASIC METABOLIC PANEL
Anion gap: 4 — ABNORMAL LOW (ref 5–15)
BUN: 12 mg/dL (ref 6–23)
CALCIUM: 8.8 mg/dL (ref 8.4–10.5)
CO2: 32 mmol/L (ref 19–32)
CREATININE: 0.98 mg/dL (ref 0.50–1.35)
Chloride: 101 mmol/L (ref 96–112)
GFR calc non Af Amer: 83 mL/min — ABNORMAL LOW (ref >90)
GLUCOSE: 92 mg/dL (ref 70–99)
POTASSIUM: 4.2 mmol/L (ref 3.5–5.1)
SODIUM: 137 mmol/L (ref 135–145)

## 2014-12-26 LAB — HEPARIN LEVEL (UNFRACTIONATED)
HEPARIN UNFRACTIONATED: 0.24 [IU]/mL — AB (ref 0.30–0.70)
HEPARIN UNFRACTIONATED: 0.51 [IU]/mL (ref 0.30–0.70)
Heparin Unfractionated: <0.1 IU/mL — ABNORMAL LOW (ref 0.30–0.70)

## 2014-12-26 LAB — MRSA PCR SCREENING: MRSA by PCR: NEGATIVE

## 2014-12-26 LAB — TSH: TSH: 5.443 u[IU]/mL — AB (ref 0.350–4.500)

## 2014-12-26 LAB — CBC
HCT: 46.2 % (ref 39.0–52.0)
Hemoglobin: 15.1 g/dL (ref 13.0–17.0)
MCH: 31.5 pg (ref 26.0–34.0)
MCHC: 32.7 g/dL (ref 30.0–36.0)
MCV: 96.5 fL (ref 78.0–100.0)
Platelets: 156 10*3/uL (ref 150–400)
RBC: 4.79 MIL/uL (ref 4.22–5.81)
RDW: 13.7 % (ref 11.5–15.5)
WBC: 5.6 10*3/uL (ref 4.0–10.5)

## 2014-12-26 LAB — LIPID PANEL
Cholesterol: 171 mg/dL (ref 0–200)
HDL: 31 mg/dL — AB (ref >39)
LDL CALC: 116 mg/dL — AB (ref 0–99)
Total CHOL/HDL Ratio: 5.5 RATIO
Triglycerides: 120 mg/dL (ref <150)
VLDL: 24 mg/dL (ref 0–40)

## 2014-12-26 LAB — TROPONIN I
Troponin I: 1.19 ng/mL (ref <0.031)
Troponin I: 1.01 ng/mL (ref <0.031)
Troponin I: 0.72 ng/mL (ref <0.031)

## 2014-12-26 MED ORDER — ATORVASTATIN CALCIUM 40 MG PO TABS
40.0000 mg | ORAL_TABLET | Freq: Every day | ORAL | Status: DC
  Administered 2014-12-26 – 2014-12-27 (×2): 40 mg via ORAL
  Filled 2014-12-26 (×2): qty 1

## 2014-12-26 MED ORDER — CLONAZEPAM 0.5 MG PO TABS
0.5000 mg | ORAL_TABLET | Freq: Every day | ORAL | Status: DC
  Administered 2014-12-26 – 2015-01-02 (×8): 0.5 mg via ORAL
  Filled 2014-12-26 (×8): qty 1

## 2014-12-26 MED ORDER — NITROGLYCERIN IN D5W 200-5 MCG/ML-% IV SOLN
3.0000 ug/min | INTRAVENOUS | Status: DC
  Administered 2014-12-26: 10 ug/min via INTRAVENOUS

## 2014-12-26 MED ORDER — LOSARTAN POTASSIUM 50 MG PO TABS
50.0000 mg | ORAL_TABLET | Freq: Every evening | ORAL | Status: DC
  Administered 2014-12-26 – 2014-12-30 (×4): 50 mg via ORAL
  Filled 2014-12-26 (×6): qty 1

## 2014-12-26 MED ORDER — HEPARIN BOLUS VIA INFUSION
3000.0000 [IU] | Freq: Once | INTRAVENOUS | Status: AC
  Administered 2014-12-26: 3000 [IU] via INTRAVENOUS
  Filled 2014-12-26: qty 3000

## 2014-12-26 MED ORDER — METOPROLOL TARTRATE 12.5 MG HALF TABLET: 12.5000 mg | ORAL_TABLET | Freq: Two times a day (BID) | ORAL | Status: DC

## 2014-12-26 MED ORDER — ALUM & MAG HYDROXIDE-SIMETH 200-200-20 MG/5ML PO SUSP
30.0000 mL | Freq: Once | ORAL | Status: AC
  Administered 2014-12-26: 30 mL via ORAL
  Filled 2014-12-26: qty 30

## 2014-12-26 MED ORDER — HEPARIN (PORCINE) IN NACL 100-0.45 UNIT/ML-% IJ SOLN
1900.0000 [IU]/h | INTRAMUSCULAR | Status: DC
  Administered 2014-12-26 (×2): 1900 [IU]/h via INTRAVENOUS
  Filled 2014-12-26: qty 250

## 2014-12-26 MED ORDER — ASPIRIN EC 81 MG PO TBEC
81.0000 mg | DELAYED_RELEASE_TABLET | Freq: Every day | ORAL | Status: DC
  Administered 2014-12-27: 81 mg via ORAL
  Filled 2014-12-26: qty 1

## 2014-12-26 MED ORDER — ASPIRIN 300 MG RE SUPP: 300.0000 mg | RECTAL | Status: DC

## 2014-12-26 MED ORDER — DOXAZOSIN MESYLATE 4 MG PO TABS
4.0000 mg | ORAL_TABLET | Freq: Every morning | ORAL | Status: DC
  Administered 2014-12-26 – 2015-01-03 (×8): 4 mg via ORAL
  Filled 2014-12-26 (×10): qty 1

## 2014-12-26 MED ORDER — SODIUM CHLORIDE 0.9 % IV SOLN: 250.0000 mL | INTRAVENOUS | Status: DC | PRN

## 2014-12-26 MED ORDER — AMLODIPINE BESYLATE 5 MG PO TABS: 5.0000 mg | ORAL_TABLET | Freq: Every evening | ORAL | Status: DC

## 2014-12-26 MED ORDER — METOPROLOL TARTRATE 1 MG/ML IV SOLN
5.0000 mg | Freq: Once | INTRAVENOUS | Status: AC
  Administered 2014-12-26: 5 mg via INTRAVENOUS
  Filled 2014-12-26: qty 5

## 2014-12-26 MED ORDER — ONDANSETRON HCL 4 MG/2ML IJ SOLN: 4.0000 mg | Freq: Four times a day (QID) | INTRAMUSCULAR | Status: DC | PRN

## 2014-12-26 MED ORDER — LOSARTAN POTASSIUM 50 MG PO TABS: 100.0000 mg | ORAL_TABLET | Freq: Every evening | ORAL | Status: DC

## 2014-12-26 MED ORDER — ASPIRIN 81 MG PO CHEW: 324.0000 mg | CHEWABLE_TABLET | ORAL | Status: DC

## 2014-12-26 MED ORDER — CETYLPYRIDINIUM CHLORIDE 0.05 % MT LIQD
7.0000 mL | Freq: Two times a day (BID) | OROMUCOSAL | Status: DC
  Administered 2014-12-26 – 2015-01-01 (×8): 7 mL via OROMUCOSAL

## 2014-12-26 MED ORDER — ALBUTEROL SULFATE (2.5 MG/3ML) 0.083% IN NEBU: 3.0000 mL | INHALATION_SOLUTION | Freq: Four times a day (QID) | RESPIRATORY_TRACT | Status: DC | PRN

## 2014-12-26 MED ORDER — SODIUM CHLORIDE 0.9 % IJ SOLN: 3.0000 mL | INTRAMUSCULAR | Status: DC | PRN

## 2014-12-26 MED ORDER — SODIUM CHLORIDE 0.9 % IJ SOLN
3.0000 mL | Freq: Two times a day (BID) | INTRAMUSCULAR | Status: DC
  Administered 2014-12-29 – 2014-12-30 (×2): 3 mL via INTRAVENOUS

## 2014-12-26 MED ORDER — ACETAMINOPHEN 325 MG PO TABS
650.0000 mg | ORAL_TABLET | ORAL | Status: DC | PRN
  Administered 2014-12-27 – 2014-12-28 (×2): 650 mg via ORAL
  Filled 2014-12-26 (×3): qty 2

## 2014-12-26 NOTE — Progress Notes (Signed)
RN called because pt BP has been soft and BB was stopped. He is to get Norvasc 5 mg, Cozaar 100 mg for BP, and Cardura 4 mg (for his prostate) at 10 am. His NTG is at 5 mcg/min, unable to go up due to BP issues.   Will d/c the Norvasc for now, decrease the Cozaar, follow BP, may have to decrease the Cozaar further in order to get BB on board.  Rosaria Ferries, PA-C 12/26/2014 9:07 AM Beeper [phone]

## 2014-12-26 NOTE — Progress Notes (Signed)
Patient was unable to tolerate lopressor IV push. Patient reported "I'm going to pass out" medication was immediately stopped. Vital signs remained stable. Dr. Percival Spanish was notified and orders received to D/C beta blocker. Will continue to monitor.

## 2014-12-26 NOTE — Progress Notes (Signed)
ANTICOAGULATION CONSULT NOTE - Follow Up Consult  Pharmacy Consult for heparin Indication: USAP   Labs:  Recent Labs  12/25/14 1543 12/25/14 1755 12/25/14 2155 12/26/14 0209  HGB 15.7  --   --   --   HCT 47.9  --   --   --   PLT 157  --   --   --   APTT 25  --   --   --   LABPROT 13.4  --   --   --   INR 1.01  --   --   --   HEPARINUNFRC  --   --   --  <0.10*  CREATININE 1.01  --   --   --   TROPONINI 0.07* 0.28* 1.16*  --      Assessment: 69yo male undetectable on heparin with initial dosing for USAP.  Goal of Therapy:  Heparin level 0.3-0.7 units/ml   Plan:  Will rebolus with heparin 3000 units and increase gtt by 4 units/kg/hr to 1700 units/hr and check level in 6hr.  Wynona Neat, PharmD, BCPS  12/26/2014,4:08 AM

## 2014-12-26 NOTE — Progress Notes (Addendum)
ANTICOAGULATION CONSULT NOTE - Follow Up Consult  Pharmacy Consult for Heparin Indication: Unstable angina  No Known Allergies  Patient Measurements: Height: 6' (182.9 cm) Weight: 284 lb 6.4 oz (129.003 kg) IBW/kg (Calculated) : 77.6 Heparin Dosing Weight: 107 kg  Vital Signs: Temp: 98.3 F (36.8 C) (02/27 0530) Temp Source: Oral (02/27 0530) BP: 124/79 mmHg (02/27 1007) Pulse Rate: 82 (02/27 0800)  Labs:  Recent Labs  12/25/14 1543 12/25/14 1755 12/25/14 2155 12/26/14 0209 12/26/14 0245 12/26/14 1130  HGB 15.7  --   --   --   --  15.1  HCT 47.9  --   --   --   --  46.2  PLT 157  --   --   --   --  156  APTT 25  --   --   --   --   --   LABPROT 13.4  --   --   --   --   --   INR 1.01  --   --   --   --   --   HEPARINUNFRC  --   --   --  <0.10*  --  0.24*  CREATININE 1.01  --   --   --   --   --   TROPONINI 0.07* 0.28* 1.16*  --  1.19*  --     Estimated Creatinine Clearance: 97.2 mL/min (by C-G formula based on Cr of 1.01).   Medications:  Scheduled:  . antiseptic oral rinse  7 mL Mouth Rinse BID  . aspirin  324 mg Oral NOW   Or  . aspirin  300 mg Rectal NOW  . [START ON 12/27/2014] aspirin EC  81 mg Oral Daily  . atorvastatin  40 mg Oral q1800  . clonazePAM  0.5 mg Oral QHS  . doxazosin  4 mg Oral q morning - 10a  . losartan  50 mg Oral QPM  . nitroGLYCERIN  5-200 mcg/min Intravenous Once  . sodium chloride  3 mL Intravenous Q12H   Infusions:  . heparin 1,700 Units/hr (12/26/14 1007)  . nitroGLYCERIN 5 mcg/min (12/26/14 0222)    Assessment: 68yo morbidly obese male transferred from [facility] on 12/25/2014 with c/o CP. Continues on heparin gtt for UA. HL remains SUBtherapeutic but has trended up to 0.24 after re-bolus and rate increase this am. Heparin bag ran dry for about 5 mins per nurse, not likely to have a great effect on current level. H/H remain wnl and stable with no reported significant s/s bleeding.   Goal of Therapy:  Heparin level 0.3-0.7  units/ml Monitor platelets by anticoagulation protocol: Yes   Plan:  - Increase heparin gtt to 1900 u/hr - F/u 6 hour HL - Daily HL/CBC - F/u plans for cath  [facility] K. Velva Harman, PharmD, [place] Clinical Pharmacist - Resident Pager: [phone] Pharmacy: [phone] 12/26/2014 12:22 PM   =========================================   Addendum: - HL 0.51, therapeutic - no bleeding reported   Plan: - Continue heparin gtt at 1900 units/hr - F/U AM labs    Charlette Hennings D. Mina Marble, PharmD, BCPS Pager:  [phone] 12/26/2014, 6:52 PM

## 2014-12-26 NOTE — H&P (Signed)
CARDIOLOGY ADMISSION NOTE  Patient ID: Devin [name] MRN: [id number] DOB/AGE: [birth year] 69 y.o.  Admit date: 12/25/2014 Primary Physician   Dr. Willey Blade Primary Cardiologist   New (lives in near [place]) Chief Complaint    Chest pain  HPI:  The patient has a history of chest pain that he has been told is related to his lung disease.  However, he had chest pain starting last night.  It was worse today when he was riding his tractor.  It is similar to other pain but much more severe.  It has been substernal and 10/10.  It is gripping or heavy.  There are not associated symptoms.  He has not had N/V, increased SOB or diaphoresis.   He has chronic DOE.  He does feel like he has some stomach upset which he gets at times.  He went to [facility].  In the ED his troponin was 1.16 trending up.  He had pain relief after morphine.   EKG shows a RBBB. There are no old EKGs to compare .     Past Medical History  Diagnosis Date  . Hypertension   . Enlarged prostate   . Melanoma of back   . Complication of anesthesia     "had a colonoscopy; whatever they gave me to knock me out didn't knock me out; I talked to them during it"  . Heart murmur     "valves are leaking"  . Asbestosis   . Sleep apnea     "can't wear masks" (12/26/2014)  . GERD (gastroesophageal reflux disease)   . Arthritis     "hands, knees" (12/26/2014)    Past Surgical History  Procedure Laterality Date  . Anterior cervical decomp/discectomy fusion    . Shoulder open rotator cuff repair Left   . Knee arthroscopy Right   . Knee cartilage surgery Left   . Melanoma excision      "back"    No Known Allergies No current facility-administered medications on file prior to encounter.   No current outpatient prescriptions on file prior to encounter.   Prior to Admission medications   Medication Sig Start Date End Date Taking? Authorizing Provider  albuterol (PROVENTIL HFA;VENTOLIN HFA) 108 (90 BASE) MCG/ACT inhaler Inhale 1-2 puffs  into the lungs every 6 (six) hours as needed for wheezing or shortness of breath.   Yes Historical Provider, MD  amLODipine (NORVASC) 5 MG tablet Take 5 mg by mouth every evening. 11/12/14  Yes Historical Provider, MD  clonazePAM (KLONOPIN) 0.5 MG tablet Take 0.5 mg by mouth at bedtime. 11/12/14  Yes Historical Provider, MD  doxazosin (CARDURA) 4 MG tablet Take 4 mg by mouth every morning.   Yes Historical Provider, MD  losartan (COZAAR) 100 MG tablet Take 100 mg by mouth every evening. 11/14/14  Yes Historical Provider, MD  naproxen sodium (ANAPROX) 220 MG tablet Take 220 mg by mouth daily as needed (FOR PAIN).   Yes Historical Provider, MD  Testosterone Cypionate 200 MG/ML KIT Inject 400 mg into the muscle every 14 (fourteen) days.   Yes Historical Provider, MD     History   Social History  . Marital Status: Married    Spouse Name: N/A  . Number of Children: 7  . Years of Education: N/A   Occupational History  . Not on file.   Social History Main Topics  . Smoking status: Never Smoker   . Smokeless tobacco: Never Used  . Alcohol Use: Yes     Comment:  12/26/2014 "I'll take a drink a couple times/yr"  . Drug Use: No  . Sexual Activity: Yes   Other Topics Concern  . Not on file   Social History Narrative   Lives at home with wife.  They have seven children.      Family History  Problem Relation Age of Onset  . Stroke Mother   . Hypertension Mother   . Heart disease Sister     Died from complications of RHD  . Heart disease Brother     Heart failure related to EtOH and smoking  . Stomach cancer Sister      ROS:  Constipation.  Otherwise as stated in the HPI and negative for all other systems.  Physical Exam: Blood pressure 133/87, pulse 84, temperature 98.3 F (36.8 C), temperature source Oral, resp. rate 18, height 6' (1.829 m), weight 284 lb 6.4 oz (129.003 kg), SpO2 95 %.  GENERAL:  Well appearing HEENT:  Pupils equal round and reactive, fundi not visualized, oral  mucosa unremarkable NECK:  No jugular venous distention, waveform within normal limits, carotid upstroke brisk and symmetric, no bruits, no thyromegaly LYMPHATICS:  No cervical, inguinal adenopathy LUNGS:  Clear to auscultation bilaterally BACK:  No CVA tenderness CHEST:  Unremarkable HEART:  PMI not displaced or sustained,S1 and S2 within normal limits, no S3, no S4, no clicks, no rubs, no murmurs ABD:  Flat, positive bowel sounds normal in frequency in pitch, no bruits, no rebound, no guarding, no midline pulsatile mass, no hepatomegaly, no splenomegaly, obese EXT:  2 plus pulses throughout, mild ankle edema, no cyanosis no clubbing SKIN:  No rashes no nodules NEURO:  Cranial nerves II through XII grossly intact, motor grossly intact throughout PSYCH:  Cognitively intact, oriented to person place and time  Labs: Lab Results  Component Value Date   BUN 16 12/25/2014   Lab Results  Component Value Date   CREATININE 1.01 12/25/2014   Lab Results  Component Value Date   NA 137 12/25/2014   K 3.9 12/25/2014   CL 105 12/25/2014   CO2 29 12/25/2014   Lab Results  Component Value Date   TROPONINI 1.16* 12/25/2014   Lab Results  Component Value Date   WBC 5.2 12/25/2014   HGB 15.7 12/25/2014   HCT 47.9 12/25/2014   MCV 97.4 12/25/2014   PLT 157 12/25/2014   No results found for: CHOL, HDL, LDLCALC, LDLDIRECT, TRIG, CHOLHDL Lab Results  Component Value Date   ALT 19 12/25/2014   AST 18 12/25/2014   ALKPHOS 51 12/25/2014   BILITOT 0.7 12/25/2014      Radiology:  CXR:  Increased pulmonary interstitial markings bilaterally. Given the patient's acute symptoms in this may reflect pneumonitis. Follow-up radiographs following anticipated antibiotic therapy are recommended to assure clearing.  EKG:  NSR, rate 95, axis WNL, no acute ST T wave changes.  12/26/2014  ASSESSMENT AND PLAN:    UNSTABLE ANGINA:  Plan cardiac cath.  The patient understands that risks included but  are not limited to stroke (1 in 1000), death (1 in 93), kidney failure [usually temporary] (1 in 500), bleeding (1 in 200), allergic reaction [possibly serious] (1 in 200).  The patient understands and agrees to proceed.     ABNORMAL CXR:  I do not suspect pneumonia.  I would defer follow up to Dr. Willey Blade.  He does have a history of chronic lung disease.    SignedBond Grieshop 12/26/2014, 1:12 AM

## 2014-12-27 DIAGNOSIS — I214 Non-ST elevation (NSTEMI) myocardial infarction: Secondary | ICD-10-CM

## 2014-12-27 LAB — HEPARIN LEVEL (UNFRACTIONATED)
HEPARIN UNFRACTIONATED: 0.77 [IU]/mL — AB (ref 0.30–0.70)
HEPARIN UNFRACTIONATED: 0.63 [IU]/mL (ref 0.30–0.70)
HEPARIN UNFRACTIONATED: 0.63 [IU]/mL (ref 0.30–0.70)

## 2014-12-27 LAB — CBC
HCT: 47.7 % (ref 39.0–52.0)
Hemoglobin: 15.7 g/dL (ref 13.0–17.0)
MCH: 32.2 pg (ref 26.0–34.0)
MCHC: 32.9 g/dL (ref 30.0–36.0)
MCV: 97.9 fL (ref 78.0–100.0)
PLATELETS: 138 10*3/uL — AB (ref 150–400)
RBC: 4.87 MIL/uL (ref 4.22–5.81)
RDW: 13.7 % (ref 11.5–15.5)
WBC: 4.4 10*3/uL (ref 4.0–10.5)

## 2014-12-27 MED ORDER — ASPIRIN 81 MG PO CHEW: 81.0000 mg | CHEWABLE_TABLET | ORAL | Status: DC

## 2014-12-27 MED ORDER — SODIUM CHLORIDE 0.9 % IV SOLN: 250.0000 mL | INTRAVENOUS | Status: DC | PRN

## 2014-12-27 MED ORDER — SODIUM CHLORIDE 0.9 % IJ SOLN: 3.0000 mL | INTRAMUSCULAR | Status: DC | PRN

## 2014-12-27 MED ORDER — ASPIRIN 81 MG PO CHEW
81.0000 mg | CHEWABLE_TABLET | ORAL | Status: AC
  Administered 2014-12-28: 81 mg via ORAL
  Filled 2014-12-27: qty 1

## 2014-12-27 MED ORDER — SODIUM CHLORIDE 0.9 % IV SOLN: 1.0000 mL/kg/h | INTRAVENOUS | Status: DC

## 2014-12-27 MED ORDER — SODIUM CHLORIDE 0.9 % IJ SOLN: 3.0000 mL | Freq: Two times a day (BID) | INTRAMUSCULAR | Status: DC

## 2014-12-27 MED ORDER — ASPIRIN EC 81 MG PO TBEC: 81.0000 mg | DELAYED_RELEASE_TABLET | Freq: Every day | ORAL | Status: DC

## 2014-12-27 MED ORDER — SODIUM CHLORIDE 0.9 % IV SOLN
INTRAVENOUS | Status: DC
  Administered 2014-12-28: 04:00:00 via INTRAVENOUS

## 2014-12-27 MED ORDER — METOPROLOL TARTRATE 25 MG PO TABS
25.0000 mg | ORAL_TABLET | Freq: Two times a day (BID) | ORAL | Status: DC
  Administered 2014-12-27 – 2014-12-28 (×4): 25 mg via ORAL
  Filled 2014-12-27 (×5): qty 1

## 2014-12-27 MED ORDER — HEPARIN (PORCINE) IN NACL 100-0.45 UNIT/ML-% IJ SOLN
1750.0000 [IU]/h | INTRAMUSCULAR | Status: DC
  Administered 2014-12-27: 1750 [IU]/h via INTRAVENOUS
  Filled 2014-12-27 (×2): qty 250

## 2014-12-27 NOTE — Progress Notes (Addendum)
ANTICOAGULATION CONSULT NOTE - Follow Up Consult  Pharmacy Consult for Heparin Indication: Unstable Angina  No Known Allergies  Patient Measurements: Height: 6' (182.9 cm) Weight: 280 lb 6.4 oz (127.189 kg) IBW/kg (Calculated) : 77.6 Heparin Dosing Weight: 106 kg  Vital Signs: Temp: 98.6 F (37 C) (02/28 1307) Temp Source: Oral (02/28 1307) BP: 117/71 mmHg (02/28 1307) Pulse Rate: 73 (02/28 1307)  Labs:  Recent Labs  12/25/14 1543  12/26/14 0245 12/26/14 1130 12/26/14 1824 12/27/14 0615 12/27/14 1520  HGB 15.7  --   --  15.1  --  15.7  --   HCT 47.9  --   --  46.2  --  47.7  --   PLT 157  --   --  156  --  138*  --   APTT 25  --   --   --   --   --   --   LABPROT 13.4  --   --   --   --   --   --   INR 1.01  --   --   --   --   --   --   HEPARINUNFRC  --   < >  --  0.24* 0.51 0.77* 0.63  CREATININE 1.01  --   --  0.98  --   --   --   TROPONINI 0.07*  < > 1.19* 1.01* 0.72*  --   --   < > = values in this interval not displayed.  Estimated Creatinine Clearance: 99.4 mL/min (by C-G formula based on Cr of 0.98).   Medications:  Heparin @ 1750 units/hr (17.5 ml/hr)  Assessment: 68 YOM transferred from [facility] on 12/25/2014 with c/o CP. The patient continues on heparin for unstable angina with a therapeutic heparin level this evening after a rate reduction earlier today (0.63 << 0.77, goal of 0.3-0.7). Note plans for cath on Monday, 2/29. No bleeding or issues with the drip noted per discussion with the RN.   Goal of Therapy:  Heparin level 0.3-0.7 units/ml Monitor platelets by anticoagulation protocol: Yes   Plan:  1. Continue heparin at the current rate of 1750 units/hr (17.5 ml/hr) 2. Will continue to monitor for any signs/symptoms of bleeding and will follow up with heparin level in 6 hours to confirm therapeutic  Alycia Rossetti, PharmD, BCPS Clinical Pharmacist Pager: [phone] 12/27/2014 4:42 PM

## 2014-12-27 NOTE — Progress Notes (Signed)
ANTICOAGULATION CONSULT NOTE - Follow Up Consult  Pharmacy Consult for Heparin Indication: Unstable angina  No Known Allergies  Patient Measurements: Height: 6' (182.9 cm) Weight: 280 lb 6.4 oz (127.189 kg) IBW/kg (Calculated) : 77.6 Heparin Dosing Weight: 107 kg  Vital Signs: Temp: 98.3 F (36.8 C) (02/28 0500) BP: 124/72 mmHg (02/28 0600) Pulse Rate: 75 (02/28 0600)  Labs:  Recent Labs  12/25/14 1543  12/26/14 0245 12/26/14 1130 12/26/14 1824 12/27/14 0615  HGB 15.7  --   --  15.1  --  15.7  HCT 47.9  --   --  46.2  --  47.7  PLT 157  --   --  156  --  138*  APTT 25  --   --   --   --   --   LABPROT 13.4  --   --   --   --   --   INR 1.01  --   --   --   --   --   HEPARINUNFRC  --   < >  --  0.24* 0.51 0.77*  CREATININE 1.01  --   --  0.98  --   --   TROPONINI 0.07*  < > 1.19* 1.01* 0.72*  --   < > = values in this interval not displayed.  Estimated Creatinine Clearance: 99.4 mL/min (by C-G formula based on Cr of 0.98).   Medications:  Scheduled:  . antiseptic oral rinse  7 mL Mouth Rinse BID  . aspirin EC  81 mg Oral Daily  . atorvastatin  40 mg Oral q1800  . clonazePAM  0.5 mg Oral QHS  . doxazosin  4 mg Oral q morning - 10a  . losartan  50 mg Oral QPM  . sodium chloride  3 mL Intravenous Q12H   Infusions:  . heparin    . nitroGLYCERIN 5 mcg/min (12/26/14 0222)    Assessment: 68yo morbidly obese male transferred from [facility] on 12/25/2014 with c/o CP. Continues on heparin gtt for UA. HL is now SUPRAtherapeutic after 1 therapeutic level. H/H remain wnl and stable with no reported significant s/s bleeding per nurse.   Goal of Therapy:  Heparin level 0.3-0.7 units/ml Monitor platelets by anticoagulation protocol: Yes   Plan:  - Decrease heparin gtt at 1750 u/hr - F/u 6 hour HL - Daily HL/CBC - F/u plans for cath  [facility] K. Velva Harman, PharmD, [place] Clinical Pharmacist - Resident Pager: [phone] Pharmacy: [phone] 12/27/2014 8:48 AM

## 2014-12-27 NOTE — Progress Notes (Signed)
ANTICOAGULATION CONSULT NOTE - Follow Up Consult  Pharmacy Consult for heparin Indication: USAP  Labs:  Recent Labs  12/25/14 1543  12/26/14 0245 12/26/14 1130 12/26/14 1824 12/27/14 0615 12/27/14 1520 12/27/14 2200  HGB 15.7  --   --  15.1  --  15.7  --   --   HCT 47.9  --   --  46.2  --  47.7  --   --   PLT 157  --   --  156  --  138*  --   --   APTT 25  --   --   --   --   --   --   --   LABPROT 13.4  --   --   --   --   --   --   --   INR 1.01  --   --   --   --   --   --   --   HEPARINUNFRC  --   < >  --  0.24* 0.51 0.77* 0.63 0.63  CREATININE 1.01  --   --  0.98  --   --   --   --   TROPONINI 0.07*  < > 1.19* 1.01* 0.72*  --   --   --   < > = values in this interval not displayed.   Assessment/Plan:  69yo male remains therapeutic on heparin. Will continue gtt at current rate and confirm stable with am labs.   Wynona Neat, PharmD, BCPS  12/27/2014,10:56 PM

## 2014-12-27 NOTE — Progress Notes (Signed)
Subjective:  No CP, no SOB. On IV NTG, Hep  Objective:  Vital Signs in the last 24 hours: Temp:  [97.9 F (36.6 C)-98.3 F (36.8 C)] 98.3 F (36.8 C) (02/28 0500) Pulse Rate:  [67-87] 75 (02/28 0600) Resp:  [6-21] 16 (02/28 0600) BP: (94-139)/(45-75) 124/72 mmHg (02/28 0600) SpO2:  [94 %-97 %] 94 % (02/28 0600) Weight:  [280 lb 6.4 oz (127.189 kg)] 280 lb 6.4 oz (127.189 kg) (02/28 0500)  Intake/Output from previous day: 02/27 0701 - 02/28 0700 In: 1139.5 [P.O.:900; I.V.:239.5] Out: 2475 [Urine:2475]   Physical Exam: General: Well developed, well nourished, in no acute distress. Head:  Normocephalic and atraumatic. Lungs: Clear to auscultation and percussion. Heart: Normal S1 and S2.  No murmur, rubs or gallops.  Abdomen: soft, non-tender, positive bowel sounds. Extremities: No clubbing or cyanosis. No edema. Neurologic: Alert and oriented x 3.    Lab Results:  Recent Labs  12/26/14 1130 12/27/14 0615  WBC 5.6 4.4  HGB 15.1 15.7  PLT 156 138*    Recent Labs  12/25/14 1543 12/26/14 1130  NA 137 137  K 3.9 4.2  CL 105 101  CO2 29 32  GLUCOSE 100* 92  BUN 16 12  CREATININE 1.01 0.98    Recent Labs  12/26/14 1130 12/26/14 1824  TROPONINI 1.01* 0.72*   Hepatic Function Panel  Recent Labs  12/25/14 1543  PROT 7.1  ALBUMIN 4.2  AST 18  ALT 19  ALKPHOS 51  BILITOT 0.7    Recent Labs  12/26/14 0245  CHOL 171   No results for input(s): PROTIME in the last 72 hours.  Imaging: Dg Chest 2 View  12/25/2014   CLINICAL DATA:  Chest pain cough and congestion since last night  EXAM: CHEST  2 VIEW  COMPARISON:  CT scan of the chest of November 18, 2010  FINDINGS: The lungs are adequately inflated. The interstitial markings are increased bilaterally. There is no alveolar infiltrate or pleural effusion. The heart and pulmonary vascularity are normal. The mediastinum is normal in width. The bony thorax exhibits no acute abnormality. There are  degenerative changes of the left shoulder.  IMPRESSION: Increased pulmonary interstitial markings bilaterally. Given the patient's acute symptoms in this may reflect pneumonitis. Follow-up radiographs following anticipated antibiotic therapy are recommended to assure clearing.   Electronically Signed   By: David  Martinique   On: 12/25/2014 16:40   Personally viewed.   Telemetry: No adverse arrhythmia Personally viewed.  Cardiac Studies:  Cath P  Scheduled Meds: . antiseptic oral rinse  7 mL Mouth Rinse BID  . aspirin EC  81 mg Oral Daily  . atorvastatin  40 mg Oral q1800  . clonazePAM  0.5 mg Oral QHS  . doxazosin  4 mg Oral q morning - 10a  . losartan  50 mg Oral QPM  . sodium chloride  3 mL Intravenous Q12H   Continuous Infusions: . heparin 1,750 Units/hr (12/27/14 0956)  . nitroGLYCERIN 5 mcg/min (12/26/14 0222)   PRN Meds:.sodium chloride, acetaminophen, albuterol, nitroGLYCERIN, ondansetron (ZOFRAN) IV, sodium chloride  Assessment/Plan:  Active Problems:   NSTEMI (non-ST elevated myocardial infarction)   Unstable angina  69 year old male, testosterone injections, with NSTEMI, chronic lung disease.  NSTMEI  - cath Monday  - Npo  - IV hydration  - IV NTG, IV Hep  - Will add low dose Bb.  - Continue high dose statin    SKAINS, MARK 12/27/2014, 10:19 AM

## 2014-12-28 ENCOUNTER — Encounter (HOSPITAL_COMMUNITY)
Admission: EM | Disposition: A | Discharge status: Home / Self Care | Payer: Self-pay | Source: Home / Self Care | Attending: Cardiothoracic Surgery

## 2014-12-28 ENCOUNTER — Other Ambulatory Visit: Payer: Self-pay | Admitting: *Deleted

## 2014-12-28 ENCOUNTER — Encounter (HOSPITAL_COMMUNITY): Payer: Self-pay | Admitting: Cardiothoracic Surgery

## 2014-12-28 ENCOUNTER — Inpatient Hospital Stay (HOSPITAL_COMMUNITY): Payer: Medicare Other

## 2014-12-28 DIAGNOSIS — I25110 Atherosclerotic heart disease of native coronary artery with unstable angina pectoris: Secondary | ICD-10-CM

## 2014-12-28 DIAGNOSIS — I214 Non-ST elevation (NSTEMI) myocardial infarction: Secondary | ICD-10-CM

## 2014-12-28 DIAGNOSIS — E785 Hyperlipidemia, unspecified: Secondary | ICD-10-CM

## 2014-12-28 DIAGNOSIS — I2510 Atherosclerotic heart disease of native coronary artery without angina pectoris: Secondary | ICD-10-CM

## 2014-12-28 HISTORY — PX: LEFT HEART CATHETERIZATION WITH CORONARY ANGIOGRAM: SHX5451

## 2014-12-28 LAB — PULMONARY FUNCTION TEST
DL/VA % pred: 153 %
DL/VA: 7.15 ml/min/mmHg/L
DLCO cor % pred: 125 %
DLCO cor: 42.28 ml/min/mmHg
DLCO unc % pred: 132 %
DLCO unc: 44.59 ml/min/mmHg
FEF 25-75 Post: 3.09 L/sec
FEF 25-75 Pre: 2.87 L/sec
FEF2575-%Change-Post: 7 %
FEF2575-%Pred-Post: 118 %
FEF2575-%Pred-Pre: 109 %
FEV1-%Change-Post: 1 %
FEV1-%Pred-Post: 86 %
FEV1-%Pred-Pre: 85 %
FEV1-Post: 2.97 L
FEV1-Pre: 2.94 L
FEV1FVC-%Change-Post: 7 %
FEV1FVC-%Pred-Pre: 108 %
FEV6-%Change-Post: -5 %
FEV6-%Pred-Post: 78 %
FEV6-%Pred-Pre: 82 %
FEV6-Post: 3.43 L
FEV6-Pre: 3.62 L
FEV6FVC-%Change-Post: 0 %
FEV6FVC-%Pred-Post: 105 %
FEV6FVC-%Pred-Pre: 104 %
FVC-%Change-Post: -5 %
FVC-%Pred-Post: 73 %
FVC-%Pred-Pre: 78 %
FVC-Post: 3.43 L
FVC-Pre: 3.65 L
Post FEV1/FVC ratio: 86 %
Post FEV6/FVC ratio: 100 %
Pre FEV1/FVC ratio: 80 %
Pre FEV6/FVC Ratio: 99 %
RV % pred: 128 %
RV: 3.19 L
TLC % pred: 94 %
TLC: 6.83 L

## 2014-12-28 LAB — BLOOD GAS, ARTERIAL
Acid-Base Excess: 1.8 mmol/L (ref 0.0–2.0)
Bicarbonate: 26 mEq/L — ABNORMAL HIGH (ref 20.0–24.0)
Drawn by: 24231
FIO2: 0.21 %
O2 Saturation: 94.4 %
Patient temperature: 98.6
TCO2: 27.3 mmol/L (ref 0–100)
pCO2 arterial: 41.8 mmHg (ref 35.0–45.0)
pH, Arterial: 7.41 (ref 7.350–7.450)
pO2, Arterial: 71.8 mmHg — ABNORMAL LOW (ref 80.0–100.0)

## 2014-12-28 LAB — TYPE AND SCREEN
ABO/RH(D): A POS
Antibody Screen: NEGATIVE

## 2014-12-28 LAB — COMPREHENSIVE METABOLIC PANEL
ALT: 27 U/L (ref 0–53)
AST: 25 U/L (ref 0–37)
Albumin: 3.6 g/dL (ref 3.5–5.2)
Alkaline Phosphatase: 45 U/L (ref 39–117)
Anion gap: 8 (ref 5–15)
BUN: 10 mg/dL (ref 6–23)
CO2: 28 mmol/L (ref 19–32)
Calcium: 8.5 mg/dL (ref 8.4–10.5)
Chloride: 100 mmol/L (ref 96–112)
Creatinine, Ser: 1.27 mg/dL (ref 0.50–1.35)
GFR calc Af Amer: 65 mL/min — ABNORMAL LOW (ref >90)
GFR calc non Af Amer: 56 mL/min — ABNORMAL LOW (ref >90)
Glucose, Bld: 200 mg/dL — ABNORMAL HIGH (ref 70–99)
Potassium: 3.8 mmol/L (ref 3.5–5.1)
Sodium: 136 mmol/L (ref 135–145)
Total Bilirubin: 0.8 mg/dL (ref 0.3–1.2)
Total Protein: 6.8 g/dL (ref 6.0–8.3)

## 2014-12-28 LAB — CBC
HCT: 46.7 % (ref 39.0–52.0)
Hemoglobin: 15.4 g/dL (ref 13.0–17.0)
MCH: 32.3 pg (ref 26.0–34.0)
MCHC: 33 g/dL (ref 30.0–36.0)
MCV: 97.9 fL (ref 78.0–100.0)
PLATELETS: 153 10*3/uL (ref 150–400)
RBC: 4.77 MIL/uL (ref 4.22–5.81)
RDW: 13.6 % (ref 11.5–15.5)
WBC: 5 10*3/uL (ref 4.0–10.5)

## 2014-12-28 LAB — CK TOTAL AND CKMB (NOT AT ARMC)
CK TOTAL: 150 U/L (ref 7–232)
CK, MB: 3.7 ng/mL (ref 0.3–4.0)
Relative Index: 2.5 (ref 0.0–2.5)

## 2014-12-28 LAB — ABO/RH: ABO/RH(D): A POS

## 2014-12-28 LAB — HEPARIN LEVEL (UNFRACTIONATED)
HEPARIN UNFRACTIONATED: 0.63 [IU]/mL (ref 0.30–0.70)
Heparin Unfractionated: 0.15 IU/mL — ABNORMAL LOW (ref 0.30–0.70)

## 2014-12-28 LAB — SURGICAL PCR SCREEN
MRSA, PCR: NEGATIVE
Staphylococcus aureus: NEGATIVE

## 2014-12-28 LAB — PROTIME-INR
INR: 1.07 (ref 0.00–1.49)
Prothrombin Time: 14 seconds (ref 11.6–15.2)

## 2014-12-28 LAB — APTT: aPTT: 37 seconds (ref 24–37)

## 2014-12-28 SURGERY — LEFT HEART CATHETERIZATION WITH CORONARY ANGIOGRAM
Anesthesia: LOCAL

## 2014-12-28 MED ORDER — LIDOCAINE HCL (PF) 1 % IJ SOLN
INTRAMUSCULAR | Status: AC
  Filled 2014-12-28: qty 30

## 2014-12-28 MED ORDER — CEFUROXIME SODIUM 1.5 G IJ SOLR
1.5000 g | INTRAMUSCULAR | Status: AC
  Administered 2014-12-29: .75 g via INTRAVENOUS
  Administered 2014-12-29: 1.5 g via INTRAVENOUS
  Filled 2014-12-28 (×2): qty 1.5

## 2014-12-28 MED ORDER — FENTANYL CITRATE 0.05 MG/ML IJ SOLN
INTRAMUSCULAR | Status: AC
  Filled 2014-12-28: qty 2

## 2014-12-28 MED ORDER — ONDANSETRON HCL 4 MG/2ML IJ SOLN: 4.0000 mg | Freq: Four times a day (QID) | INTRAMUSCULAR | Status: DC | PRN

## 2014-12-28 MED ORDER — PLASMA-LYTE 148 IV SOLN
INTRAVENOUS | Status: DC
  Filled 2014-12-28: qty 2.5

## 2014-12-28 MED ORDER — TEMAZEPAM 15 MG PO CAPS: 15.0000 mg | ORAL_CAPSULE | Freq: Once | ORAL | Status: AC | PRN

## 2014-12-28 MED ORDER — DEXTROSE 5 % IV SOLN
750.0000 mg | INTRAVENOUS | Status: DC
  Filled 2014-12-28: qty 750

## 2014-12-28 MED ORDER — MAGNESIUM SULFATE 50 % IJ SOLN
40.0000 meq | INTRAMUSCULAR | Status: DC
  Filled 2014-12-28: qty 10

## 2014-12-28 MED ORDER — DIAZEPAM 5 MG PO TABS
5.0000 mg | ORAL_TABLET | Freq: Once | ORAL | Status: AC
  Administered 2014-12-29: 5 mg via ORAL
  Filled 2014-12-28: qty 1

## 2014-12-28 MED ORDER — DOPAMINE-DEXTROSE 3.2-5 MG/ML-% IV SOLN
0.0000 ug/kg/min | INTRAVENOUS | Status: DC
  Filled 2014-12-28: qty 250

## 2014-12-28 MED ORDER — VANCOMYCIN HCL 10 G IV SOLR: 1250.0000 mg | INTRAVENOUS | Status: DC

## 2014-12-28 MED ORDER — CHLORHEXIDINE GLUCONATE 4 % EX LIQD: 60.0000 mL | Freq: Once | CUTANEOUS | Status: DC

## 2014-12-28 MED ORDER — MORPHINE SULFATE 2 MG/ML IJ SOLN: 2.0000 mg | INTRAMUSCULAR | Status: DC | PRN

## 2014-12-28 MED ORDER — ALPRAZOLAM 0.25 MG PO TABS: 0.2500 mg | ORAL_TABLET | ORAL | Status: DC | PRN

## 2014-12-28 MED ORDER — ALBUTEROL SULFATE (2.5 MG/3ML) 0.083% IN NEBU
2.5000 mg | INHALATION_SOLUTION | Freq: Once | RESPIRATORY_TRACT | Status: AC
  Administered 2014-12-28: 2.5 mg via RESPIRATORY_TRACT

## 2014-12-28 MED ORDER — DEXMEDETOMIDINE HCL IN NACL 400 MCG/100ML IV SOLN
0.1000 ug/kg/h | INTRAVENOUS | Status: DC
  Filled 2014-12-28: qty 100

## 2014-12-28 MED ORDER — ATORVASTATIN CALCIUM 80 MG PO TABS
80.0000 mg | ORAL_TABLET | Freq: Every day | ORAL | Status: DC
  Administered 2014-12-28 – 2015-01-02 (×5): 80 mg via ORAL
  Filled 2014-12-28 (×7): qty 1

## 2014-12-28 MED ORDER — DEXTROSE 5 % IV SOLN
0.0000 ug/min | INTRAVENOUS | Status: DC
  Filled 2014-12-28: qty 4

## 2014-12-28 MED ORDER — NITROGLYCERIN IN D5W 200-5 MCG/ML-% IV SOLN
2.0000 ug/min | INTRAVENOUS | Status: DC
  Filled 2014-12-28: qty 250

## 2014-12-28 MED ORDER — VERAPAMIL HCL 2.5 MG/ML IV SOLN
INTRAVENOUS | Status: AC
  Filled 2014-12-28: qty 2

## 2014-12-28 MED ORDER — PHENYLEPHRINE HCL 10 MG/ML IJ SOLN
30.0000 ug/min | INTRAVENOUS | Status: DC
  Filled 2014-12-28: qty 2

## 2014-12-28 MED ORDER — ASPIRIN 81 MG PO CHEW: 81.0000 mg | CHEWABLE_TABLET | Freq: Every day | ORAL | Status: DC

## 2014-12-28 MED ORDER — SODIUM CHLORIDE 0.9 % IV SOLN
INTRAVENOUS | Status: DC
  Filled 2014-12-28: qty 30

## 2014-12-28 MED ORDER — PAPAVERINE HCL 30 MG/ML IJ SOLN
INTRAMUSCULAR | Status: AC
  Administered 2014-12-29: 500 mL
  Filled 2014-12-28: qty 2.5

## 2014-12-28 MED ORDER — BISACODYL 5 MG PO TBEC
5.0000 mg | DELAYED_RELEASE_TABLET | Freq: Once | ORAL | Status: AC
  Administered 2014-12-28: 5 mg via ORAL
  Filled 2014-12-28: qty 1

## 2014-12-28 MED ORDER — EPINEPHRINE HCL 1 MG/ML IJ SOLN
0.0000 ug/min | INTRAVENOUS | Status: DC
  Filled 2014-12-28: qty 4

## 2014-12-28 MED ORDER — SODIUM CHLORIDE 0.9 % IV SOLN
INTRAVENOUS | Status: DC
  Filled 2014-12-28: qty 2.5

## 2014-12-28 MED ORDER — ACETAMINOPHEN 325 MG PO TABS: 650.0000 mg | ORAL_TABLET | ORAL | Status: DC | PRN

## 2014-12-28 MED ORDER — DEXTROSE 5 % IV SOLN: 1.5000 g | INTRAVENOUS | Status: DC

## 2014-12-28 MED ORDER — HEPARIN SODIUM (PORCINE) 1000 UNIT/ML IJ SOLN
INTRAMUSCULAR | Status: AC
  Filled 2014-12-28: qty 1

## 2014-12-28 MED ORDER — POTASSIUM CHLORIDE 2 MEQ/ML IV SOLN
80.0000 meq | INTRAVENOUS | Status: DC
  Filled 2014-12-28: qty 40

## 2014-12-28 MED ORDER — PHENYLEPHRINE HCL 10 MG/ML IJ SOLN
30.0000 ug/min | INTRAVENOUS | Status: DC
  Administered 2014-12-29: 15 ug/min via INTRAVENOUS
  Filled 2014-12-28: qty 2

## 2014-12-28 MED ORDER — CHLORHEXIDINE GLUCONATE 4 % EX LIQD
60.0000 mL | Freq: Once | CUTANEOUS | Status: AC
  Administered 2014-12-28: 4 via TOPICAL
  Filled 2014-12-28: qty 60

## 2014-12-28 MED ORDER — CEFUROXIME SODIUM 750 MG IJ SOLR
750.0000 mg | INTRAMUSCULAR | Status: DC
  Filled 2014-12-28 (×2): qty 750

## 2014-12-28 MED ORDER — NITROGLYCERIN 1 MG/10 ML FOR IR/CATH LAB
INTRA_ARTERIAL | Status: AC
  Filled 2014-12-28: qty 10

## 2014-12-28 MED ORDER — MIDAZOLAM HCL 2 MG/2ML IJ SOLN
INTRAMUSCULAR | Status: AC
  Filled 2014-12-28: qty 2

## 2014-12-28 MED ORDER — SODIUM CHLORIDE 0.9 % IV SOLN
INTRAVENOUS | Status: DC
  Filled 2014-12-28: qty 40

## 2014-12-28 MED ORDER — METOPROLOL TARTRATE 12.5 MG HALF TABLET
12.5000 mg | ORAL_TABLET | Freq: Once | ORAL | Status: AC
  Administered 2014-12-29: 12.5 mg via ORAL
  Filled 2014-12-28: qty 1

## 2014-12-28 MED ORDER — HEPARIN (PORCINE) IN NACL 100-0.45 UNIT/ML-% IJ SOLN
1700.0000 [IU]/h | INTRAMUSCULAR | Status: DC
  Administered 2014-12-28: 1700 [IU]/h via INTRAVENOUS
  Filled 2014-12-28 (×2): qty 250

## 2014-12-28 MED ORDER — VANCOMYCIN HCL 10 G IV SOLR
1500.0000 mg | INTRAVENOUS | Status: AC
  Administered 2014-12-29: 1500 mg via INTRAVENOUS
  Filled 2014-12-28 (×2): qty 1500

## 2014-12-28 MED ORDER — NITROGLYCERIN IN D5W 200-5 MCG/ML-% IV SOLN: 2.0000 ug/min | INTRAVENOUS | Status: DC

## 2014-12-28 MED ORDER — DOPAMINE-DEXTROSE 3.2-5 MG/ML-% IV SOLN: 0.0000 ug/kg/min | INTRAVENOUS | Status: DC

## 2014-12-28 MED ORDER — SODIUM CHLORIDE 0.9 % IV SOLN
INTRAVENOUS | Status: AC
  Administered 2014-12-28: 18:00:00 via INTRAVENOUS

## 2014-12-28 MED ORDER — HEPARIN (PORCINE) IN NACL 2-0.9 UNIT/ML-% IJ SOLN
INTRAMUSCULAR | Status: AC
  Filled 2014-12-28: qty 1500

## 2014-12-28 NOTE — Progress Notes (Signed)
Patient Profile: 69 y/o male transferred from [facility] for NSTEMI.   Subjective: No complaints. Currently CP free. No dyspnea.   Objective: Vital signs in last 24 hours: Temp:  [97.5 F (36.4 C)-98.7 F (37.1 C)] 98.7 F (37.1 C) (02/29 0732) Pulse Rate:  [71-79] 77 (02/29 0755) Resp:  [14-18] 18 (02/29 0732) BP: (117-130)/(68-76) 130/76 mmHg (02/29 0755) SpO2:  [94 %-97 %] 94 % (02/29 0732) Weight:  [280 lb (127.007 kg)] 280 lb (127.007 kg) (02/29 0500) Last BM Date: 12/27/14  Intake/Output from previous day: 02/28 0701 - 02/29 0700 In: 960 [P.O.:960] Out: 1425 [Urine:1425] Intake/Output this shift:    Medications Current Facility-Administered Medications  Medication Dose Route Frequency Provider Last Rate Last Dose  . 0.9 %  sodium chloride infusion  250 mL Intravenous PRN Minus Breeding, MD      . 0.9 %  sodium chloride infusion  250 mL Intravenous PRN Minus Breeding, MD      . 0.9 %  sodium chloride infusion  1 mL/kg/hr Intravenous Continuous Minus Breeding, MD   0 mL/kg/hr at 12/28/14 0602  . 0.9 %  sodium chloride infusion  250 mL Intravenous PRN Candee Furbish, MD      . 0.9 %  sodium chloride infusion   Intravenous Continuous Candee Furbish, MD 100 mL/hr at 12/28/14 0555    . acetaminophen (TYLENOL) tablet 650 mg  650 mg Oral Q4H PRN Minus Breeding, MD   650 mg at 12/27/14 0126  . albuterol (PROVENTIL) (2.5 MG/3ML) 0.083% nebulizer solution 3 mL  3 mL Inhalation Q6H PRN Minus Breeding, MD      . antiseptic oral rinse (CPC / CETYLPYRIDINIUM CHLORIDE 0.05%) solution 7 mL  7 mL Mouth Rinse BID Minus Breeding, MD   7 mL at 12/26/14 2111  . [START ON 12/29/2014] aspirin EC tablet 81 mg  81 mg Oral Daily Minus Breeding, MD      . atorvastatin (LIPITOR) tablet 40 mg  40 mg Oral q1800 Candee Furbish, MD   40 mg at 12/27/14 1718  . clonazePAM (KLONOPIN) tablet 0.5 mg  0.5 mg Oral QHS Minus Breeding, MD   0.5 mg at 12/27/14 2154  . doxazosin (CARDURA) tablet 4 mg  4 mg Oral q morning - 10a  Minus Breeding, MD   4 mg at 12/27/14 1021  . heparin ADULT infusion 100 units/mL (25000 units/250 mL)  1,750 Units/hr Intravenous Continuous Adora Fridge, RPH 17.5 mL/hr at 12/27/14 1107 1,750 Units/hr at 12/27/14 1107  . losartan (COZAAR) tablet 50 mg  50 mg Oral QPM Rhonda G Barrett, PA-C   50 mg at 12/27/14 1718  . metoprolol tartrate (LOPRESSOR) tablet 25 mg  25 mg Oral BID Candee Furbish, MD   25 mg at 12/28/14 0755  . nitroGLYCERIN (NITROSTAT) SL tablet 0.4 mg  0.4 mg Sublingual Q5 min PRN Ezequiel Essex, MD   0.4 mg at 12/25/14 1729  . nitroGLYCERIN 50 mg in dextrose 5 % 250 mL (0.2 mg/mL) infusion  3-30 mcg/min Intravenous Titrated Minus Breeding, MD 1.5 mL/hr at 12/26/14 0222 5 mcg/min at 12/26/14 0222  . ondansetron (ZOFRAN) injection 4 mg  4 mg Intravenous Q6H PRN Minus Breeding, MD      . sodium chloride 0.9 % injection 3 mL  3 mL Intravenous Q12H Minus Breeding, MD   3 mL at 12/26/14 0142  . sodium chloride 0.9 % injection 3 mL  3 mL Intravenous PRN Minus Breeding, MD      . sodium chloride 0.9 %  injection 3 mL  3 mL Intravenous Q12H Minus Breeding, MD   0 mL at 12/27/14 1145  . sodium chloride 0.9 % injection 3 mL  3 mL Intravenous PRN Minus Breeding, MD      . sodium chloride 0.9 % injection 3 mL  3 mL Intravenous Q12H Candee Furbish, MD   0 mL at 12/27/14 1145  . sodium chloride 0.9 % injection 3 mL  3 mL Intravenous PRN Candee Furbish, MD        PE: General appearance: alert, cooperative, no distress and moderately obese Neck: no carotid bruit and no JVD Lungs: clear to auscultation bilaterally Heart: regular rate and rhythm, S1, S2 normal, no murmur, click, rub or gallop Extremities: trace bilateral LEE Pulses: 2+ and symmetric Skin: warm and dry Neurologic: Grossly normal  Lab Results:   Recent Labs  12/26/14 1130 12/27/14 0615 12/28/14 0332  WBC 5.6 4.4 5.0  HGB 15.1 15.7 15.4  HCT 46.2 47.7 46.7  PLT 156 138* 153   BMET  Recent Labs  12/25/14 1543  12/26/14 1130  NA 137 137  K 3.9 4.2  CL 105 101  CO2 29 32  GLUCOSE 100* 92  BUN 16 12  CREATININE 1.01 0.98  CALCIUM 8.9 8.8   PT/INR  Recent Labs  12/25/14 1543  LABPROT 13.4  INR 1.01   Lipid Panel     Component Value Date/Time   CHOL 171 12/26/2014 0245   TRIG 120 12/26/2014 0245   HDL 31* 12/26/2014 0245   CHOLHDL 5.5 12/26/2014 0245   VLDL 24 12/26/2014 0245   LDLCALC 116* 12/26/2014 0245     Cardiac Enzymes Cardiac Panel (last 3 results)  Recent Labs  12/26/14 0245 12/26/14 1130 12/26/14 1824  TROPONINI 1.19* 1.01* 0.72*    Assessment/Plan  Active Problems:   NSTEMI (non-ST elevated myocardial infarction)   Unstable angina  1. NSTEMI: Troponins trending downward 1.19-->1.01 -->0.72. CP free. Plan for [facility] +/- PCI today. Continue ASA, statin, BB and ARB.  2. HLD: LDL elevated at 116. Continue statin therapy with lipitor. Recheck fasting lipid and LFTs in 6 weeks.    LOS: 3 days    Ewelina Naves M. Ladoris Gene 12/28/2014 8:44 AM

## 2014-12-28 NOTE — Progress Notes (Signed)
  Echocardiogram 2D Echocardiogram has been performed.  Devin [name] 12/28/2014, 4:21 PM

## 2014-12-28 NOTE — Progress Notes (Signed)
Pre-op Cardiac Surgery  Carotid Findings:  Findings suggest 1-39% internal carotid artery stenosis bilaterally. Vertebral arteries are patent with antegrade flow.  Upper Extremity Right Left  Brachial Pressures Triphasic-unable to obtain pressure due to recent catheterization 140-Triphasic  Radial Waveforms Triphasic Triphasic  Ulnar Waveforms Triphasic Triphasic  Palmar Arch (Allen's Test) Unable to assess due to recent catheterization Signal decreases >50% with radial compression, is unaffected with ulnar compression.    12/28/2014 3:35 PM Maudry Mayhew, RVT, RDCS, RDMS

## 2014-12-28 NOTE — Research (Signed)
BIOFLOW V  Informed Consent   Subject Name: Devin [name]  Subject met inclusion and exclusion criteria.  The informed consent form, study requirements and expectations were reviewed with the subject and questions and concerns were addressed prior to the signing of the consent form.  The subject verbalized understanding of the trail requirements.  The subject agreed to participate in the [place] and signed the informed consent on 12/28/14 at 0855.  The informed consent was obtained prior to performance of any protocol-specific procedures for the subject.  A copy of the signed informed consent was given to the subject and a copy was placed in the subject's medical record.  Blossom Hoops 12/28/2014, 9:14 AM

## 2014-12-28 NOTE — Progress Notes (Signed)
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, awaiting TCTS consult  No Known Allergies  Patient Measurements: Height: 6' (182.9 cm) Weight: 280 lb (127.007 kg) IBW/kg (Calculated) : 77.6 Heparin Dosing Weight: 106 kg  Vital Signs: Temp: 98.3 F (36.8 C) (02/29 1109) Temp Source: Axillary (02/29 1109) BP: 130/60 mmHg (02/29 1305) Pulse Rate: 74 (02/29 1305)  Labs:  Recent Labs  12/25/14 1543  12/26/14 0245 12/26/14 1130 12/26/14 1824 12/27/14 0615 12/27/14 1520 12/27/14 2200 12/28/14 0332 12/28/14 0945  HGB 15.7  --   --  15.1  --  15.7  --   --  15.4  --   HCT 47.9  --   --  46.2  --  47.7  --   --  46.7  --   PLT 157  --   --  156  --  138*  --   --  153  --   APTT 25  --   --   --   --   --   --   --   --   --   LABPROT 13.4  --   --   --   --   --   --   --   --   --   INR 1.01  --   --   --   --   --   --   --   --   --   HEPARINUNFRC  --   < >  --  0.24* 0.51 0.77* 0.63 0.63 0.63  --   CREATININE 1.01  --   --  0.98  --   --   --   --   --   --   CKTOTAL  --   --   --   --   --   --   --   --   --  150  CKMB  --   --   --   --   --   --   --   --   --  3.7  TROPONINI 0.07*  < > 1.19* 1.01* 0.72*  --   --   --   --   --   < > = values in this interval not displayed.  Estimated Creatinine Clearance: 99.4 mL/min (by C-G formula based on Cr of 0.98).   Assessment: Devin [name] transferred from [facility] on 12/25/2014 with c/o CP and started on IV heparin. 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 Pharmacy consulted to resume IV heparin 3 hrs post TR band removal - removed at 13:15. No bleeding noted, CBC is stable. Will decrease heparin drip rate slightly as previous levels were at the upper end of normal.  Goal of Therapy:  Heparin level 0.3-0.7 units/ml Monitor platelets by anticoagulation protocol: Yes   Plan:  - Resume heparin drip at 1700 units/hr with no bolus at  16:15 - 6 hr heparin level - Daily heparin level and CBC - Monitor for s/sx of bleeding  [facility], [place].D., BCPS Clinical Pharmacist Pager: [phone] 12/28/2014 1:17 PM

## 2014-12-28 NOTE — H&P (View-Only) (Signed)
Subjective:  No CP, no SOB. On IV NTG, Hep  Objective:  Vital Signs in the last 24 hours: Temp:  [97.9 F (36.6 C)-98.3 F (36.8 C)] 98.3 F (36.8 C) (02/28 0500) Pulse Rate:  [67-87] 75 (02/28 0600) Resp:  [6-21] 16 (02/28 0600) BP: (94-139)/(45-75) 124/72 mmHg (02/28 0600) SpO2:  [94 %-97 %] 94 % (02/28 0600) Weight:  [280 lb 6.4 oz (127.189 kg)] 280 lb 6.4 oz (127.189 kg) (02/28 0500)  Intake/Output from previous day: 02/27 0701 - 02/28 0700 In: 1139.5 [P.O.:900; I.V.:239.5] Out: 2475 [Urine:2475]   Physical Exam: General: Well developed, well nourished, in no acute distress. Head:  Normocephalic and atraumatic. Lungs: Clear to auscultation and percussion. Heart: Normal S1 and S2.  No murmur, rubs or gallops.  Abdomen: soft, non-tender, positive bowel sounds. Extremities: No clubbing or cyanosis. No edema. Neurologic: Alert and oriented x 3.    Lab Results:  Recent Labs  12/26/14 1130 12/27/14 0615  WBC 5.6 4.4  HGB 15.1 15.7  PLT 156 138*    Recent Labs  12/25/14 1543 12/26/14 1130  NA 137 137  K 3.9 4.2  CL 105 101  CO2 29 32  GLUCOSE 100* 92  BUN 16 12  CREATININE 1.01 0.98    Recent Labs  12/26/14 1130 12/26/14 1824  TROPONINI 1.01* 0.72*   Hepatic Function Panel  Recent Labs  12/25/14 1543  PROT 7.1  ALBUMIN 4.2  AST 18  ALT 19  ALKPHOS 51  BILITOT 0.7    Recent Labs  12/26/14 0245  CHOL 171   No results for input(s): PROTIME in the last 72 hours.  Imaging: Dg Chest 2 View  12/25/2014   CLINICAL DATA:  Chest pain cough and congestion since last night  EXAM: CHEST  2 VIEW  COMPARISON:  CT scan of the chest of November 18, 2010  FINDINGS: The lungs are adequately inflated. The interstitial markings are increased bilaterally. There is no alveolar infiltrate or pleural effusion. The heart and pulmonary vascularity are normal. The mediastinum is normal in width. The bony thorax exhibits no acute abnormality. There are  degenerative changes of the left shoulder.  IMPRESSION: Increased pulmonary interstitial markings bilaterally. Given the patient's acute symptoms in this may reflect pneumonitis. Follow-up radiographs following anticipated antibiotic therapy are recommended to assure clearing.   Electronically Signed   By: David  Martinique   On: 12/25/2014 16:40   Personally viewed.   Telemetry: No adverse arrhythmia Personally viewed.  Cardiac Studies:  Cath P  Scheduled Meds: . antiseptic oral rinse  7 mL Mouth Rinse BID  . aspirin EC  81 mg Oral Daily  . atorvastatin  40 mg Oral q1800  . clonazePAM  0.5 mg Oral QHS  . doxazosin  4 mg Oral q morning - 10a  . losartan  50 mg Oral QPM  . sodium chloride  3 mL Intravenous Q12H   Continuous Infusions: . heparin 1,750 Units/hr (12/27/14 0956)  . nitroGLYCERIN 5 mcg/min (12/26/14 0222)   PRN Meds:.sodium chloride, acetaminophen, albuterol, nitroGLYCERIN, ondansetron (ZOFRAN) IV, sodium chloride  Assessment/Plan:  Active Problems:   NSTEMI (non-ST elevated myocardial infarction)   Unstable angina  69 year old male, testosterone injections, with NSTEMI, chronic lung disease.  NSTMEI  - cath Monday  - Npo  - IV hydration  - IV NTG, IV Hep  - Will add low dose Bb.  - Continue high dose statin    Devin [name] 12/27/2014, 10:19 AM

## 2014-12-28 NOTE — Interval H&P Note (Signed)
Cath Lab Visit (complete for each Cath Lab visit)  Clinical Evaluation Leading to the Procedure:   ACS: Yes.    Non-ACS:    Anginal Classification: CCS IV  Anti-ischemic medical therapy: No Therapy  Non-Invasive Test Results: No non-invasive testing performed  Prior CABG: No previous CABG      History and Physical Interval Note:  12/28/2014 9:43 AM  Flonnie Hailstone  has presented today for surgery, with the diagnosis of unstable angina  The various methods of treatment have been discussed with the patient and family. After consideration of risks, benefits and other options for treatment, the patient has consented to  Procedure(s): LEFT HEART CATHETERIZATION WITH CORONARY ANGIOGRAM (N/A) as a surgical intervention .  The patient's history has been reviewed, patient examined, no change in status, stable for surgery.  I have reviewed the patient's chart and labs.  Questions were answered to the patient's satisfaction.     Lorretta Harp

## 2014-12-28 NOTE — Care Management (Signed)
Medicare Important Message given? YES   (If response is "NO", the following Medicare IM given date fields will be blank)   Date Medicare IM given:   Medicare IM given by: Graves-Bigelow, Kittie Krizan  

## 2014-12-28 NOTE — CV Procedure (Signed)
Devin [name] is a 69 y.o. male    [id number] LOCATION:  FACILITY: [facility]  PHYSICIAN: Quay Burow, M.D. 28-Dec-1945   DATE OF PROCEDURE:  12/28/2014  DATE OF DISCHARGE:     CARDIAC CATHETERIZATION     History obtained from chart review.Mr. Murdaugh is a 69 year old Caucasian male admitted on 12/25/14 with acute coronary syndrome. He had minimally elevated troponins. He presents now for diagnostic cardiac catheterization to define his anatomy.   PROCEDURE DESCRIPTION:   The patient was brought to the second floor [facility] cath lab in the postabsorptive state. He waspremedicated with Valium 5 mg by mouth, IV Versed and fentanyl. His right wristwas prepped and shaved in usual sterile fashion. Xylocaine 1% was used for local anesthesia. A 6 French sheath was inserted into the right radial artery using standard Seldinger technique. The patient received 6000 units  of heparin  intravenously.  A 5 [place] TIG catheter and pigtail catheters were used for selective coronary angiography and left ventriculography respectively. Visipaque dye was used for the entirety of the case. Retrograde aortic, left ventricular end pullback pressures were recorded.    HEMODYNAMICS:    AO SYSTOLIC/AO DIASTOLIC: 025/42   LV SYSTOLIC/LV DIASTOLIC: 706/2  ANGIOGRAPHIC RESULTS:   1. Left main; normal  2. LAD; 75% segmental tapered proximal, 95% tandem mid 3. Left circumflex; 75% distal AV groove.  4. Right coronary artery; this was a dominant vessel and was the infarct-related artery. There was a 95% mid stenosis as well as a 90% ostial PDA stenosis 7. Left ventriculography; RAO left ventriculogram was performed using  25 mL of Visipaque dye at 12 mL/second. The overall LVEF estimated  60 %  Without wall motion abnormalities  IMPRESSION:Mr. Okane has three-vessel disease with preserved LV function. He has significant segmental proximal LAD disease in addition to tandem mid LAD stenoses. His infarct  related artery was the RCA with a high-grade mid RCA stenosis. I believe the best therapy would be complete revascularization by coronary artery bypass grafting. The sheath was removed and a TR band was placed on the right wrist to achieve. Hemostasis. The patient left the lab in stable condition. TCTS was notified. Heparin will be restarted by pharmacy 3 hours after sheath removal.  Lorretta Harp. MD, [facility] 12/28/2014 10:30 AM

## 2014-12-28 NOTE — Progress Notes (Signed)
ANTICOAGULATION CONSULT NOTE - Follow Up Consult  Pharmacy Consult for heparin Indication: CAD awaiting CABG  Labs:  Recent Labs  12/26/14 0245  12/26/14 1130 12/26/14 1824 12/27/14 0615  12/27/14 2200 12/28/14 0332 12/28/14 0945 12/28/14 1929 12/28/14 2215  HGB  --   < > 15.1  --  15.7  --   --  15.4  --   --   --   HCT  --   --  46.2  --  47.7  --   --  46.7  --   --   --   PLT  --   --  156  --  138*  --   --  153  --   --   --   APTT  --   --   --   --   --   --   --   --   --  37  --   LABPROT  --   --   --   --   --   --   --   --   --  14.0  --   INR  --   --   --   --   --   --   --   --   --  1.07  --   HEPARINUNFRC  --   --  0.24* 0.51 0.77*  < > 0.63 0.63  --   --  0.15*  CREATININE  --   --  0.98  --   --   --   --   --   --  1.27  --   CKTOTAL  --   --   --   --   --   --   --   --  150  --   --   CKMB  --   --   --   --   --   --   --   --  3.7  --   --   TROPONINI 1.19*  --  1.01* 0.72*  --   --   --   --   --   --   --   < > = values in this interval not displayed.   Assessment/Plan:  69yo male subtherapeutic on heparin after resumed post-cath though was started late and lab represents only four hours on heparin gtt. Will continue gtt at current rate and recheck with am labs; plan for CABG in am.   Wynona Neat, PharmD, BCPS  12/28/2014,11:02 PM

## 2014-12-28 NOTE — Consult Note (Signed)
[address]       [place] [address]             [phone]        Cort E Antu Ingram Medical Record #[id number] Date of Birth: 12/28/1945  Referring: No ref. provider found Primary Care: [name], MD  Chief Complaint:    Chief Complaint  Patient presents with  . Chest Pain   patient examined, cardiac catheterization and echocardiogram reviewed  History of Present Illness:     I was asked to evaluate this 69 year old Caucasian nondiabetic obese male who presented to the emergency department with chest pain and was found to have positive cardiac enzymes. EKG showed nonspecific changes. The patient was treated conservatively and underwent diagnostic cardiac catheterization today via right radial artery. He was found have normal LV systolic function with severe multivessel coronary artery disease including high-grade greater than 90% stenosis of the RCA and the LAD. The patient's angina responded to IV heparin and nitroglycerin. Because of the patient's severe multivessel CAD surgical coronary revascularization was recommended. Echocardiogram showed good LV function without significant valvular disease. Final report is pending.  Current Activity/ Functional Status: Patient is retired. He worked over 37 years for [employer]. He states he was exposed to asbestos. He has chronic dyspnea on exertion, mild. He works on his farm part-time. He lives with his wife. He does not drink. He does not smoke tobacco.   Zubrod Score: At the time of surgery this patient's most appropriate activity status/level should be described as: []     0    Normal activity, no symptoms [x]     1    Restricted in physical strenuous activity but ambulatory, able to do out light work []     2    Ambulatory and capable of self care, unable to do work activities, up and about                 more than 50%  Of the time                            []     3    Only limited self care, in bed greater  than 50% of waking hours []     4    Completely disabled, no self care, confined to bed or chair []     5    Moribund  Past Medical History  Diagnosis Date  . Hypertension   . Enlarged prostate   . Melanoma of back   . Complication of anesthesia     "had a colonoscopy; whatever they gave me to knock me out didn't knock me out; I talked to them during it"  . Heart murmur     "valves are leaking"  . Asbestosis   . Sleep apnea     "can't wear masks" (12/26/2014)  . GERD (gastroesophageal reflux disease)   . Arthritis     "hands, knees" (12/26/2014)    Past Surgical History  Procedure Laterality Date  . Anterior cervical decomp/discectomy fusion    . Shoulder open rotator cuff repair Left   . Knee arthroscopy Right   . Knee cartilage surgery Left   . Melanoma excision      "back"    History  Smoking status  . Never Smoker   Smokeless tobacco  . Never Used    History  Alcohol Use  . Yes  Comment: 12/26/2014 "I'll take a drink a couple times/yr"    History   Social History  . Marital Status: Married    Spouse Name: N/A  . Number of Children: 7  . Years of Education: N/A   Occupational History  . Not on file.   Social History Main Topics  . Smoking status: Never Smoker   . Smokeless tobacco: Never Used  . Alcohol Use: Yes     Comment: 12/26/2014 "I'll take a drink a couple times/yr"  . Drug Use: No  . Sexual Activity: Yes   Other Topics Concern  . Not on file   Social History Narrative   Lives at home with wife.  They have seven children.      No Known Allergies  Current Facility-Administered Medications  Medication Dose Route Frequency Provider Last Rate Last Dose  . 0.9 %  sodium chloride infusion  250 mL Intravenous PRN Minus Breeding, MD      . 0.9 %  sodium chloride infusion   Intravenous Continuous Lorretta Harp, MD 75 mL/hr at 12/28/14 1041    . acetaminophen (TYLENOL) tablet 650 mg  650 mg Oral Q4H PRN Minus Breeding, MD   650 mg at 12/27/14  0126  . [COMPLETED] albuterol (PROVENTIL) (2.5 MG/3ML) 0.083% nebulizer solution 2.5 mg  2.5 mg Nebulization Once Ivin Poot, MD   2.5 mg at 12/28/14 1715  . albuterol (PROVENTIL) (2.5 MG/3ML) 0.083% nebulizer solution 3 mL  3 mL Inhalation Q6H PRN Minus Breeding, MD      . ALPRAZolam Duanne Moron) tablet 0.25-0.5 mg  0.25-0.5 mg Oral Q4H PRN Ivin Poot, MD      . Derrill Memo ON 12/29/2014] aminocaproic acid (AMICAR) 10 g in sodium chloride 0.9 % 100 mL infusion   Intravenous To OR Minus Breeding, MD      . antiseptic oral rinse (CPC / CETYLPYRIDINIUM CHLORIDE 0.05%) solution 7 mL  7 mL Mouth Rinse BID Minus Breeding, MD   7 mL at 12/26/14 2111  . [START ON 12/29/2014] aspirin EC tablet 81 mg  81 mg Oral Daily Minus Breeding, MD      . atorvastatin (LIPITOR) tablet 80 mg  80 mg Oral q1800 Lorretta Harp, MD      . bisacodyl (DULCOLAX) EC tablet 5 mg  5 mg Oral Once Ivin Poot, MD      . Derrill Memo ON 12/29/2014] cefUROXime (ZINACEF) 1.5 g in dextrose 5 % 50 mL IVPB  1.5 g Intravenous To OR Minus Breeding, MD      . Derrill Memo ON 12/29/2014] cefUROXime (ZINACEF) 750 mg in dextrose 5 % 50 mL IVPB  750 mg Intravenous To OR Minus Breeding, MD      . chlorhexidine (HIBICLENS) 4 % liquid 4 application  60 mL Topical Once Ivin Poot, MD       And  . Derrill Memo ON 12/29/2014] chlorhexidine (HIBICLENS) 4 % liquid 4 application  60 mL Topical Once Ivin Poot, MD      . clonazePAM Bobbye Charleston) tablet 0.5 mg  0.5 mg Oral QHS Minus Breeding, MD   0.5 mg at 12/27/14 2154  . [START ON 12/29/2014] dexmedetomidine (PRECEDEX) 400 MCG/100ML (4 mcg/mL) infusion  0.1-0.7 mcg/kg/hr Intravenous To OR Minus Breeding, MD      . Derrill Memo ON 12/29/2014] diazepam (VALIUM) tablet 5 mg  5 mg Oral Once Ivin Poot, MD      . Derrill Memo ON 12/29/2014] DOPamine (INTROPIN) 800 mg in dextrose 5 % 250 mL (  3.2 mg/mL) infusion  0-10 mcg/kg/min Intravenous To OR Minus Breeding, MD      . doxazosin (CARDURA) tablet 4 mg  4 mg Oral q morning - 10a Minus Breeding, MD   4 mg at 12/28/14 1050  . [START ON 12/29/2014] EPINEPHrine (ADRENALIN) 4 mg in dextrose 5 % 250 mL (0.016 mg/mL) infusion  0-10 mcg/min Intravenous To OR Minus Breeding, MD      . Derrill Memo ON 12/29/2014] heparin 2,500 Units, papaverine 30 mg in electrolyte-148 (PLASMALYTE-148) 500 mL irrigation   Irrigation To OR Minus Breeding, MD      . Derrill Memo ON 12/29/2014] heparin 30,000 units/NS 1000 mL solution for CELLSAVER   Other To OR Minus Breeding, MD      . heparin ADULT infusion 100 units/mL (25000 units/250 mL)  1,700 Units/hr Intravenous Continuous [place]      . [START ON 12/29/2014] insulin regular (NOVOLIN R,HUMULIN R) 250 Units in sodium chloride 0.9 % 250 mL (1 Units/mL) infusion   Intravenous To OR Minus Breeding, MD      . losartan (COZAAR) tablet 50 mg  50 mg Oral QPM Rhonda G Barrett, PA-C   50 mg at 12/27/14 1718  . [START ON 12/29/2014] magnesium sulfate (IV Push/IM) injection 40 mEq  40 mEq Other To OR Minus Breeding, MD      . Derrill Memo ON 12/29/2014] metoprolol tartrate (LOPRESSOR) tablet 12.5 mg  12.5 mg Oral Once Ivin Poot, MD      . metoprolol tartrate (LOPRESSOR) tablet 25 mg  25 mg Oral BID Candee Furbish, MD   25 mg at 12/28/14 0755  . morphine 2 MG/ML injection 2 mg  2 mg Intravenous Q1H PRN Lorretta Harp, MD      . nitroGLYCERIN (NITROSTAT) SL tablet 0.4 mg  0.4 mg Sublingual Q5 min PRN Ezequiel Essex, MD   0.4 mg at 12/25/14 1729  . nitroGLYCERIN 50 mg in dextrose 5 % 250 mL (0.2 mg/mL) infusion  3-30 mcg/min Intravenous Titrated Minus Breeding, MD 1.5 mL/hr at 12/26/14 0222 5 mcg/min at 12/26/14 0222  . [START ON 12/29/2014] nitroGLYCERIN 50 mg in dextrose 5 % 250 mL (0.2 mg/mL) infusion  2-200 mcg/min Intravenous To OR Minus Breeding, MD      . ondansetron [facility]) injection 4 mg  4 mg Intravenous Q6H PRN Minus Breeding, MD      . Derrill Memo ON 12/29/2014] phenylephrine (NEO-SYNEPHRINE) 20 mg in dextrose 5 % 250 mL (0.08 mg/mL) infusion  30-200 mcg/min  Intravenous To OR Minus Breeding, MD      . Derrill Memo ON 12/29/2014] potassium chloride injection 80 mEq  80 mEq Other To OR Minus Breeding, MD      . sodium chloride 0.9 % injection 3 mL  3 mL Intravenous Q12H Minus Breeding, MD   3 mL at 12/26/14 0142  . sodium chloride 0.9 % injection 3 mL  3 mL Intravenous PRN Minus Breeding, MD      . temazepam (RESTORIL) capsule 15 mg  15 mg Oral Once PRN Ivin Poot, MD      . Derrill Memo ON 12/29/2014] vancomycin (VANCOCIN) 1,500 mg in sodium chloride 0.9 % 250 mL IVPB  1,500 mg Intravenous To OR Minus Breeding, MD        Prescriptions prior to admission  Medication Sig Dispense Refill Last Dose  . albuterol (PROVENTIL HFA;VENTOLIN HFA) 108 (90 BASE) MCG/ACT inhaler Inhale 1-2 puffs into the lungs every 6 (six) hours as needed for wheezing or shortness  of breath.     Marland Kitchen amLODipine (NORVASC) 5 MG tablet Take 5 mg by mouth every evening.   12/24/2014 at Unknown time  . clonazePAM (KLONOPIN) 0.5 MG tablet Take 0.5 mg by mouth at bedtime.   12/24/2014 at Unknown time  . doxazosin (CARDURA) 4 MG tablet Take 4 mg by mouth every morning.   12/25/2014 at Unknown time  . losartan (COZAAR) 100 MG tablet Take 100 mg by mouth every evening.   12/24/2014 at Unknown time  . naproxen sodium (ANAPROX) 220 MG tablet Take 220 mg by mouth daily as needed (FOR PAIN).   12/25/2014 at Unknown time  . Testosterone Cypionate 200 MG/ML KIT Inject 400 mg into the muscle every 14 (fourteen) days.   12/21/2014    Family History  Problem Relation Age of Onset  . Stroke Mother   . Hypertension Mother   . Heart disease Sister     Died from complications of RHD  . Heart disease Brother     Heart failure related to EtOH and smoking  . Stomach cancer Sister      Review of Systems:  Patient is right-hand dominant He has had multiple orthopedic procedures in his knees and right shoulder without anesthetic problem He denies any bleeding diaphysis He denies known rib fractures or pneumothorax      Cardiac Review of Systems: Y or N  Chest Pain [ Y  ]  Resting SOB [ N  ] Exertional SOB  [ Y ]  Orthopnea [ no ]   Pedal Edema [ no]    Palpitations [no  ] Syncope  [no  ]   Presyncope [ no  ]  General Review of Systems: [Y] = yes [  ]=no Constitional: recent weight change [no  ]; anorexia [  ]; fatigue [  ]; nausea [  ]; night sweats [  ]; fever [  ]; or chills [  ]                                                               Dental: poor dentition[  ]; Last Dentist visit: 6 months  Eye : blurred vision [  ]; diplopia [   ]; vision changes [  ];  Amaurosis fugax[  ]; Resp: cough [  ];  wheezing[  ];  hemoptysis[  ]; shortness of breath[yes  ]; paroxysmal nocturnal dyspnea[  ]; dyspnea on exertion[  yes]; or orthopnea[  ];  GI:  gallstones[  ], vomiting[  ];  dysphagia[  ]; melena[  ];  hematochezia [  ]; heartburn[  ];   Hx of  Colonoscopy[yes ]; GU: kidney stones [  ]; hematuria[  ];   dysuria [  ];  nocturia[  ];  history of     obstruction [  ]; urinary frequency [  ]             Skin: rash, swelling[  ];, hair loss[  ];  peripheral edema[  ];  or itching[  ]; Musculosketetal: myalgias[  ];  joint swelling[  ];  joint erythema[  ];  joint pain[  ];  back pain[  ];  Heme/Lymph: bruising[  ];  bleeding[  ];  anemia[  ];  Neuro: TIA[  ];  headaches[  ];  stroke[no  ];  vertigo[  ];  seizures[  ];   paresthesias[  ];  difficulty walking[  ];  Psych:depression[  ]; anxiety[  ];  Endocrine: diabetes[no  ];  thyroid dysfunction[  ];  Immunizations: Flu [  ]; Pneumococcal[  ];  Other: No history of blood transfusions in the past  Physical Exam: BP 130/60 mmHg  Pulse 74  Temp(Src) 98.3 F (36.8 C) (Axillary)  Resp 22  Ht 6' (1.829 m)  Wt 280 lb (127.007 kg)  BMI 37.97 kg/m2  SpO2 90%    General: Obese Caucasian male no acute distress HEENT: Normocephalic pupils equal , dentition adequate Neck: Supple without JVD, adenopathy, or bruit Chest: Clear to auscultation, symmetrical  breath sounds, no rhonchi, no tenderness             or deformity Cardiovascular: Regular rate and rhythm, no murmur, no gallop, peripheral pulses             palpable in all extremities Abdomen:  Soft, obese, nontender, no palpable mass or organomegaly Extremities: Warm, well-perfused, no clubbing cyanosis edema or tenderness, compression dressing over right radial artery without hematoma              no venous stasis changes of the legs Rectal/GU: Deferred Neuro: Grossly non--focal and symmetrical throughout Skin: Clean and dry without rash or ulceration     Diagnostic Studies & Laboratory data:   Coronary angiogram and echocardiogram personally reviewed  Recent Radiology Findings:  Chest x-ray personally reviewed showing some interstitial coarse lung markings consistent with prior history of asbestos exposure  I have independently reviewed the above radiologic studies.  Recent Lab Findings: Lab Results  Component Value Date   WBC 5.0 12/28/2014   HGB 15.4 12/28/2014   HCT 46.7 12/28/2014   PLT 153 12/28/2014   GLUCOSE 92 12/26/2014   CHOL 171 12/26/2014   TRIG 120 12/26/2014   HDL 31* 12/26/2014   LDLCALC 116* 12/26/2014   ALT 19 12/25/2014   AST 18 12/25/2014   NA 137 12/26/2014   K 4.2 12/26/2014   CL 101 12/26/2014   CREATININE 0.98 12/26/2014   BUN 12 12/26/2014   CO2 32 12/26/2014   TSH 5.443* 12/26/2014   INR 1.01 12/25/2014      Assessment / Plan:     Severe three-vessel coronary artery disease Non-ST elevation MI with unstable angina Obesity Hypertension   69 year old obese hypertensive male with severe multivessel coronary disease unstable angina and a recent slight non-ST elevation MI. The best long-term therapy for his severe coronary disease would be multivessel coronary artery bypass grafting. I discussed the procedure in detail the patient and his wife. I've discussed the indications benefits alternatives and risks. He understands and agrees to  proceed with surgery in a.m.    @ME1 @ 12/28/2014 5:12 PM

## 2014-12-29 ENCOUNTER — Encounter (HOSPITAL_COMMUNITY)
Admission: EM | Disposition: A | Discharge status: Home / Self Care | Payer: Medicare Other | Source: Home / Self Care | Attending: Cardiothoracic Surgery

## 2014-12-29 ENCOUNTER — Inpatient Hospital Stay (HOSPITAL_COMMUNITY): Payer: Medicare Other | Admitting: Anesthesiology

## 2014-12-29 ENCOUNTER — Inpatient Hospital Stay (HOSPITAL_COMMUNITY): Payer: Medicare Other

## 2014-12-29 DIAGNOSIS — I25110 Atherosclerotic heart disease of native coronary artery with unstable angina pectoris: Secondary | ICD-10-CM

## 2014-12-29 DIAGNOSIS — Z951 Presence of aortocoronary bypass graft: Secondary | ICD-10-CM

## 2014-12-29 HISTORY — PX: CORONARY ARTERY BYPASS GRAFT: SHX141

## 2014-12-29 HISTORY — PX: TEE WITHOUT CARDIOVERSION: SHX5443

## 2014-12-29 LAB — HEMOGLOBIN AND HEMATOCRIT, BLOOD
HCT: 36.3 % — ABNORMAL LOW (ref 39.0–52.0)
Hemoglobin: 12.3 g/dL — ABNORMAL LOW (ref 13.0–17.0)

## 2014-12-29 LAB — POCT I-STAT, CHEM 8
BUN: 12 mg/dL (ref 6–23)
BUN: 10 mg/dL (ref 6–23)
BUN: 11 mg/dL (ref 6–23)
BUN: 11 mg/dL (ref 6–23)
BUN: 10 mg/dL (ref 6–23)
CALCIUM ION: 1.01 mmol/L — AB (ref 1.13–1.30)
CALCIUM ION: 1.09 mmol/L — AB (ref 1.13–1.30)
CHLORIDE: 101 mmol/L (ref 96–112)
CREATININE: 0.8 mg/dL (ref 0.50–1.35)
Calcium, Ion: 1.14 mmol/L (ref 1.13–1.30)
Calcium, Ion: 1.16 mmol/L (ref 1.13–1.30)
Calcium, Ion: 1.08 mmol/L — ABNORMAL LOW (ref 1.13–1.30)
Chloride: 101 mmol/L (ref 96–112)
Chloride: 96 mmol/L (ref 96–112)
Chloride: 100 mmol/L (ref 96–112)
Chloride: 98 mmol/L (ref 96–112)
Creatinine, Ser: 1 mg/dL (ref 0.50–1.35)
Creatinine, Ser: 0.9 mg/dL (ref 0.50–1.35)
Creatinine, Ser: 1 mg/dL (ref 0.50–1.35)
Creatinine, Ser: 0.9 mg/dL (ref 0.50–1.35)
GLUCOSE: 111 mg/dL — AB (ref 70–99)
Glucose, Bld: 114 mg/dL — ABNORMAL HIGH (ref 70–99)
Glucose, Bld: 116 mg/dL — ABNORMAL HIGH (ref 70–99)
Glucose, Bld: 152 mg/dL — ABNORMAL HIGH (ref 70–99)
Glucose, Bld: 165 mg/dL — ABNORMAL HIGH (ref 70–99)
HCT: 34 % — ABNORMAL LOW (ref 39.0–52.0)
HCT: 36 % — ABNORMAL LOW (ref 39.0–52.0)
HEMATOCRIT: 45 % (ref 39.0–52.0)
HEMATOCRIT: 42 % (ref 39.0–52.0)
HEMATOCRIT: 35 % — AB (ref 39.0–52.0)
HEMOGLOBIN: 15.3 g/dL (ref 13.0–17.0)
HEMOGLOBIN: 12.2 g/dL — AB (ref 13.0–17.0)
Hemoglobin: 11.6 g/dL — ABNORMAL LOW (ref 13.0–17.0)
Hemoglobin: 14.3 g/dL (ref 13.0–17.0)
Hemoglobin: 11.9 g/dL — ABNORMAL LOW (ref 13.0–17.0)
POTASSIUM: 4.1 mmol/L (ref 3.5–5.1)
Potassium: 4.3 mmol/L (ref 3.5–5.1)
Potassium: 4.2 mmol/L (ref 3.5–5.1)
Potassium: 5.1 mmol/L (ref 3.5–5.1)
Potassium: 4.4 mmol/L (ref 3.5–5.1)
SODIUM: 136 mmol/L (ref 135–145)
SODIUM: 138 mmol/L (ref 135–145)
SODIUM: 136 mmol/L (ref 135–145)
Sodium: 138 mmol/L (ref 135–145)
Sodium: 135 mmol/L (ref 135–145)
TCO2: 24 mmol/L (ref 0–100)
TCO2: 23 mmol/L (ref 0–100)
TCO2: 24 mmol/L (ref 0–100)
TCO2: 21 mmol/L (ref 0–100)
TCO2: 21 mmol/L (ref 0–100)

## 2014-12-29 LAB — POCT I-STAT GLUCOSE
Glucose, Bld: 143 mg/dL — ABNORMAL HIGH (ref 70–99)
OPERATOR ID: 3406

## 2014-12-29 LAB — CBC
HCT: 43 % (ref 39.0–52.0)
HCT: 42.6 % (ref 39.0–52.0)
HCT: 41.8 % (ref 39.0–52.0)
Hemoglobin: 14.3 g/dL (ref 13.0–17.0)
Hemoglobin: 14 g/dL (ref 13.0–17.0)
Hemoglobin: 13.9 g/dL (ref 13.0–17.0)
MCH: 31.9 pg (ref 26.0–34.0)
MCH: 31.8 pg (ref 26.0–34.0)
MCH: 32.1 pg (ref 26.0–34.0)
MCHC: 33.3 g/dL (ref 30.0–36.0)
MCHC: 32.9 g/dL (ref 30.0–36.0)
MCHC: 33.3 g/dL (ref 30.0–36.0)
MCV: 96 fL (ref 78.0–100.0)
MCV: 96.8 fL (ref 78.0–100.0)
MCV: 96.5 fL (ref 78.0–100.0)
Platelets: 158 10*3/uL (ref 150–400)
Platelets: 111 10*3/uL — ABNORMAL LOW (ref 150–400)
Platelets: 136 10*3/uL — ABNORMAL LOW (ref 150–400)
RBC: 4.48 MIL/uL (ref 4.22–5.81)
RBC: 4.4 MIL/uL (ref 4.22–5.81)
RBC: 4.33 MIL/uL (ref 4.22–5.81)
RDW: 13.7 % (ref 11.5–15.5)
RDW: 13.4 % (ref 11.5–15.5)
RDW: 13.5 % (ref 11.5–15.5)
WBC: 6.3 10*3/uL (ref 4.0–10.5)
WBC: 11 10*3/uL — ABNORMAL HIGH (ref 4.0–10.5)
WBC: 13.8 10*3/uL — ABNORMAL HIGH (ref 4.0–10.5)

## 2014-12-29 LAB — BASIC METABOLIC PANEL
Anion gap: 10 (ref 5–15)
BUN: 12 mg/dL (ref 6–23)
CO2: 27 mmol/L (ref 19–32)
Calcium: 8.5 mg/dL (ref 8.4–10.5)
Chloride: 102 mmol/L (ref 96–112)
Creatinine, Ser: 1.15 mg/dL (ref 0.50–1.35)
GFR calc Af Amer: 74 mL/min — ABNORMAL LOW (ref >90)
GFR calc non Af Amer: 64 mL/min — ABNORMAL LOW (ref >90)
Glucose, Bld: 91 mg/dL (ref 70–99)
Potassium: 3.9 mmol/L (ref 3.5–5.1)
Sodium: 139 mmol/L (ref 135–145)

## 2014-12-29 LAB — POCT I-STAT 3, ART BLOOD GAS (G3+)
ACID-BASE EXCESS: 2 mmol/L (ref 0.0–2.0)
Acid-base deficit: 1 mmol/L (ref 0.0–2.0)
Acid-base deficit: 1 mmol/L (ref 0.0–2.0)
BICARBONATE: 27.4 meq/L — AB (ref 20.0–24.0)
BICARBONATE: 24.3 meq/L — AB (ref 20.0–24.0)
Bicarbonate: 25.4 mEq/L — ABNORMAL HIGH (ref 20.0–24.0)
O2 Saturation: 100 %
O2 Saturation: 100 %
O2 Saturation: 99 %
PCO2 ART: 39.9 mmHg (ref 35.0–45.0)
PH ART: 7.373 (ref 7.350–7.450)
PH ART: 7.332 — AB (ref 7.350–7.450)
PO2 ART: 326 mmHg — AB (ref 80.0–100.0)
PO2 ART: 176 mmHg — AB (ref 80.0–100.0)
TCO2: 29 mmol/L (ref 0–100)
TCO2: 26 mmol/L (ref 0–100)
TCO2: 27 mmol/L (ref 0–100)
pCO2 arterial: 47 mmHg — ABNORMAL HIGH (ref 35.0–45.0)
pCO2 arterial: 47.6 mmHg — ABNORMAL HIGH (ref 35.0–45.0)
pH, Arterial: 7.393 (ref 7.350–7.450)
pO2, Arterial: 127 mmHg — ABNORMAL HIGH (ref 80.0–100.0)

## 2014-12-29 LAB — CREATININE, SERUM
Creatinine, Ser: 1.05 mg/dL (ref 0.50–1.35)
GFR calc Af Amer: 82 mL/min — ABNORMAL LOW (ref >90)
GFR calc non Af Amer: 71 mL/min — ABNORMAL LOW (ref >90)

## 2014-12-29 LAB — APTT: aPTT: 29 seconds (ref 24–37)

## 2014-12-29 LAB — PLATELET COUNT: Platelets: 117 10*3/uL — ABNORMAL LOW (ref 150–400)

## 2014-12-29 LAB — POCT I-STAT 4, (NA,K, GLUC, HGB,HCT)
Glucose, Bld: 113 mg/dL — ABNORMAL HIGH (ref 70–99)
HEMATOCRIT: 43 % (ref 39.0–52.0)
Hemoglobin: 14.6 g/dL (ref 13.0–17.0)
POTASSIUM: 4 mmol/L (ref 3.5–5.1)
SODIUM: 138 mmol/L (ref 135–145)

## 2014-12-29 LAB — MAGNESIUM: Magnesium: 2.8 mg/dL — ABNORMAL HIGH (ref 1.5–2.5)

## 2014-12-29 LAB — PROTIME-INR
INR: 1.33 (ref 0.00–1.49)
PROTHROMBIN TIME: 16.6 s — AB (ref 11.6–15.2)

## 2014-12-29 LAB — HEPARIN LEVEL (UNFRACTIONATED): HEPARIN UNFRACTIONATED: 0.3 [IU]/mL (ref 0.30–0.70)

## 2014-12-29 SURGERY — CORONARY ARTERY BYPASS GRAFTING (CABG)
Anesthesia: General | Site: Chest

## 2014-12-29 MED ORDER — DEXMEDETOMIDINE HCL IN NACL 200 MCG/50ML IV SOLN
0.0000 ug/kg/h | INTRAVENOUS | Status: DC
  Administered 2014-12-29: 0.016 ug/kg/h via INTRAVENOUS

## 2014-12-29 MED ORDER — SODIUM CHLORIDE 0.9 % IJ SOLN: 3.0000 mL | INTRAMUSCULAR | Status: DC | PRN

## 2014-12-29 MED ORDER — LACTATED RINGERS IV SOLN
INTRAVENOUS | Status: DC | PRN
  Administered 2014-12-29: 08:00:00 via INTRAVENOUS

## 2014-12-29 MED ORDER — VANCOMYCIN HCL IN DEXTROSE 1-5 GM/200ML-% IV SOLN
1000.0000 mg | Freq: Two times a day (BID) | INTRAVENOUS | Status: AC
  Administered 2014-12-29 – 2014-12-30 (×3): 1000 mg via INTRAVENOUS
  Filled 2014-12-29 (×3): qty 200

## 2014-12-29 MED ORDER — ONDANSETRON HCL 4 MG/2ML IJ SOLN
4.0000 mg | Freq: Four times a day (QID) | INTRAMUSCULAR | Status: DC | PRN
  Administered 2014-12-30 – 2014-12-31 (×2): 4 mg via INTRAVENOUS
  Filled 2014-12-29 (×4): qty 2

## 2014-12-29 MED ORDER — PHENYLEPHRINE HCL 10 MG/ML IJ SOLN
10.0000 mg | INTRAVENOUS | Status: DC | PRN
  Administered 2014-12-29: 40 ug/min via INTRAVENOUS

## 2014-12-29 MED ORDER — METOPROLOL TARTRATE 25 MG/10 ML ORAL SUSPENSION
12.5000 mg | Freq: Two times a day (BID) | ORAL | Status: DC
  Filled 2014-12-29 (×7): qty 5

## 2014-12-29 MED ORDER — BISACODYL 10 MG RE SUPP: 10.0000 mg | Freq: Every day | RECTAL | Status: DC

## 2014-12-29 MED ORDER — PANTOPRAZOLE SODIUM 40 MG PO TBEC
40.0000 mg | DELAYED_RELEASE_TABLET | Freq: Every day | ORAL | Status: DC
  Administered 2014-12-31 – 2015-01-03 (×4): 40 mg via ORAL
  Filled 2014-12-29 (×4): qty 1

## 2014-12-29 MED ORDER — ROCURONIUM BROMIDE 100 MG/10ML IV SOLN
INTRAVENOUS | Status: DC | PRN
  Administered 2014-12-29 (×7): 50 mg via INTRAVENOUS

## 2014-12-29 MED ORDER — FENTANYL CITRATE 0.05 MG/ML IJ SOLN
INTRAMUSCULAR | Status: AC
Start: 2014-12-29 — End: 2014-12-29
  Filled 2014-12-29: qty 5

## 2014-12-29 MED ORDER — FENTANYL CITRATE 0.05 MG/ML IJ SOLN
INTRAMUSCULAR | Status: AC
  Filled 2014-12-29: qty 5

## 2014-12-29 MED ORDER — ACETAMINOPHEN 500 MG PO TABS
1000.0000 mg | ORAL_TABLET | Freq: Four times a day (QID) | ORAL | Status: DC
Start: 2014-12-30 — End: 2015-01-03
  Administered 2014-12-29 – 2015-01-02 (×12): 1000 mg via ORAL
  Filled 2014-12-29 (×20): qty 2

## 2014-12-29 MED ORDER — SODIUM CHLORIDE 0.9 % IV SOLN: 250.0000 mL | INTRAVENOUS | Status: DC

## 2014-12-29 MED ORDER — TRAMADOL HCL 50 MG PO TABS
50.0000 mg | ORAL_TABLET | ORAL | Status: DC | PRN
  Administered 2015-01-01 (×2): 100 mg via ORAL
  Filled 2014-12-29 (×2): qty 2
  Filled 2014-12-29: qty 1

## 2014-12-29 MED ORDER — ACETAMINOPHEN 160 MG/5ML PO SOLN
1000.0000 mg | Freq: Four times a day (QID) | ORAL | Status: DC
Start: 2014-12-30 — End: 2015-01-03
  Filled 2014-12-29: qty 40

## 2014-12-29 MED ORDER — SODIUM CHLORIDE 0.9 % IJ SOLN
INTRAMUSCULAR | Status: DC | PRN
  Administered 2014-12-29 (×3): 4 mL via TOPICAL

## 2014-12-29 MED ORDER — HEPARIN SODIUM (PORCINE) 1000 UNIT/ML IJ SOLN
INTRAMUSCULAR | Status: DC | PRN
  Administered 2014-12-29 (×2): 2 mL via INTRAVENOUS
  Administered 2014-12-29: 29 mL via INTRAVENOUS
  Administered 2014-12-29: 5 mL via INTRAVENOUS

## 2014-12-29 MED ORDER — METOPROLOL TARTRATE 12.5 MG HALF TABLET
12.5000 mg | ORAL_TABLET | Freq: Two times a day (BID) | ORAL | Status: DC
  Filled 2014-12-29 (×7): qty 1

## 2014-12-29 MED ORDER — 0.9 % SODIUM CHLORIDE (POUR BTL) OPTIME
TOPICAL | Status: DC | PRN
  Administered 2014-12-29: 6000 mL

## 2014-12-29 MED ORDER — ASPIRIN EC 325 MG PO TBEC
325.0000 mg | DELAYED_RELEASE_TABLET | Freq: Every day | ORAL | Status: DC
  Administered 2014-12-30 – 2015-01-03 (×5): 325 mg via ORAL
  Filled 2014-12-29 (×5): qty 1

## 2014-12-29 MED ORDER — PROTAMINE SULFATE 10 MG/ML IV SOLN
INTRAVENOUS | Status: DC | PRN
  Administered 2014-12-29: 290 mg via INTRAVENOUS
  Administered 2014-12-29: 10 mg via INTRAVENOUS

## 2014-12-29 MED ORDER — PROMETHAZINE HCL 25 MG/ML IJ SOLN
6.2500 mg | INTRAMUSCULAR | Status: DC | PRN
Start: 2014-12-29 — End: 2014-12-31

## 2014-12-29 MED ORDER — OXYCODONE HCL 5 MG PO TABS
5.0000 mg | ORAL_TABLET | ORAL | Status: DC | PRN
  Administered 2014-12-30 – 2015-01-02 (×7): 10 mg via ORAL
  Filled 2014-12-29 (×7): qty 2

## 2014-12-29 MED ORDER — ROCURONIUM BROMIDE 50 MG/5ML IV SOLN
INTRAVENOUS | Status: AC
  Filled 2014-12-29: qty 2

## 2014-12-29 MED ORDER — SODIUM CHLORIDE 0.45 % IV SOLN: INTRAVENOUS | Status: DC

## 2014-12-29 MED ORDER — LIDOCAINE HCL (CARDIAC) 20 MG/ML IV SOLN
INTRAVENOUS | Status: AC
  Filled 2014-12-29: qty 5

## 2014-12-29 MED ORDER — VANCOMYCIN HCL IN DEXTROSE 1-5 GM/200ML-% IV SOLN
1000.0000 mg | Freq: Once | INTRAVENOUS | Status: DC
Start: 2014-12-29 — End: 2014-12-29
  Filled 2014-12-29: qty 200

## 2014-12-29 MED ORDER — FENTANYL CITRATE 0.05 MG/ML IJ SOLN
INTRAMUSCULAR | Status: DC | PRN
  Administered 2014-12-29: 50 ug via INTRAVENOUS
  Administered 2014-12-29 (×2): 250 ug via INTRAVENOUS
  Administered 2014-12-29: 50 ug via INTRAVENOUS
  Administered 2014-12-29: 150 ug via INTRAVENOUS
  Administered 2014-12-29 (×2): 250 ug via INTRAVENOUS

## 2014-12-29 MED ORDER — MORPHINE SULFATE 2 MG/ML IJ SOLN: 1.0000 mg | INTRAMUSCULAR | Status: DC | PRN

## 2014-12-29 MED ORDER — HYDROMORPHONE HCL 1 MG/ML IJ SOLN: 0.2500 mg | INTRAMUSCULAR | Status: DC | PRN

## 2014-12-29 MED ORDER — LACTATED RINGERS IV SOLN: 500.0000 mL | Freq: Once | INTRAVENOUS | Status: DC | PRN

## 2014-12-29 MED ORDER — MIDAZOLAM HCL 2 MG/2ML IJ SOLN
INTRAMUSCULAR | Status: AC
  Filled 2014-12-29: qty 2

## 2014-12-29 MED ORDER — ALBUMIN HUMAN 5 % IV SOLN
250.0000 mL | INTRAVENOUS | Status: AC | PRN
  Administered 2014-12-29 – 2014-12-30 (×3): 250 mL via INTRAVENOUS
  Filled 2014-12-29 (×3): qty 250

## 2014-12-29 MED ORDER — SODIUM CHLORIDE 0.9 % IJ SOLN
3.0000 mL | Freq: Two times a day (BID) | INTRAMUSCULAR | Status: DC
  Administered 2014-12-30 – 2014-12-31 (×3): 3 mL via INTRAVENOUS

## 2014-12-29 MED ORDER — PROPOFOL 10 MG/ML IV BOLUS
INTRAVENOUS | Status: AC
  Filled 2014-12-29: qty 20

## 2014-12-29 MED ORDER — NITROGLYCERIN IN D5W 200-5 MCG/ML-% IV SOLN: 0.0000 ug/min | INTRAVENOUS | Status: DC

## 2014-12-29 MED ORDER — PROTAMINE SULFATE 10 MG/ML IV SOLN
INTRAVENOUS | Status: AC
  Filled 2014-12-29: qty 25

## 2014-12-29 MED ORDER — MIDAZOLAM HCL 2 MG/2ML IJ SOLN: 2.0000 mg | INTRAMUSCULAR | Status: DC | PRN

## 2014-12-29 MED ORDER — INSULIN REGULAR HUMAN 100 UNIT/ML IJ SOLN
250.0000 [IU] | INTRAMUSCULAR | Status: DC | PRN
  Administered 2014-12-29: 1.5 [IU]/h via INTRAVENOUS

## 2014-12-29 MED ORDER — BISACODYL 5 MG PO TBEC
10.0000 mg | DELAYED_RELEASE_TABLET | Freq: Every day | ORAL | Status: DC
  Administered 2014-12-30 – 2015-01-02 (×4): 10 mg via ORAL
  Filled 2014-12-29 (×5): qty 2

## 2014-12-29 MED ORDER — DEXTROSE 5 % IV SOLN
1.5000 g | Freq: Two times a day (BID) | INTRAVENOUS | Status: AC
  Administered 2014-12-29 – 2014-12-31 (×4): 1.5 g via INTRAVENOUS
  Filled 2014-12-29 (×4): qty 1.5

## 2014-12-29 MED ORDER — ROCURONIUM BROMIDE 50 MG/5ML IV SOLN
INTRAVENOUS | Status: AC
  Filled 2014-12-29: qty 1

## 2014-12-29 MED ORDER — SODIUM CHLORIDE 0.9 % IV SOLN: INTRAVENOUS | Status: DC

## 2014-12-29 MED ORDER — SODIUM CHLORIDE 0.9 % IV SOLN
10.0000 g | INTRAVENOUS | Status: DC | PRN
  Administered 2014-12-29: 5 g/h via INTRAVENOUS

## 2014-12-29 MED ORDER — MIDAZOLAM HCL 5 MG/5ML IJ SOLN
INTRAMUSCULAR | Status: DC | PRN
  Administered 2014-12-29: 1 mg via INTRAVENOUS
  Administered 2014-12-29: 2 mg via INTRAVENOUS
  Administered 2014-12-29: 1 mg via INTRAVENOUS
  Administered 2014-12-29 (×2): 4 mg via INTRAVENOUS
  Administered 2014-12-29 (×2): 1 mg via INTRAVENOUS

## 2014-12-29 MED ORDER — PROTAMINE SULFATE 10 MG/ML IV SOLN
INTRAVENOUS | Status: AC
  Filled 2014-12-29: qty 5

## 2014-12-29 MED ORDER — ALBUMIN HUMAN 5 % IV SOLN: 250.0000 mL | INTRAVENOUS | Status: DC | PRN

## 2014-12-29 MED ORDER — LIDOCAINE HCL (CARDIAC) 20 MG/ML IV SOLN
INTRAVENOUS | Status: DC | PRN
  Administered 2014-12-29: 100 mg via INTRAVENOUS

## 2014-12-29 MED ORDER — ACETAMINOPHEN 650 MG RE SUPP: 650.0000 mg | Freq: Once | RECTAL | Status: DC

## 2014-12-29 MED ORDER — PROPOFOL 10 MG/ML IV BOLUS
INTRAVENOUS | Status: DC | PRN
  Administered 2014-12-29: 200 mg via INTRAVENOUS

## 2014-12-29 MED ORDER — SODIUM CHLORIDE 0.9 % IV SOLN
INTRAVENOUS | Status: DC
  Administered 2014-12-30: 01:00:00 via INTRAVENOUS
  Filled 2014-12-29 (×2): qty 2.5

## 2014-12-29 MED ORDER — DOCUSATE SODIUM 100 MG PO CAPS
200.0000 mg | ORAL_CAPSULE | Freq: Every day | ORAL | Status: DC
  Administered 2014-12-30 – 2015-01-03 (×5): 200 mg via ORAL
  Filled 2014-12-29 (×5): qty 2

## 2014-12-29 MED ORDER — HEPARIN SODIUM (PORCINE) 1000 UNIT/ML IJ SOLN
INTRAMUSCULAR | Status: AC
  Filled 2014-12-29: qty 1

## 2014-12-29 MED ORDER — SODIUM CHLORIDE 0.9 % IV SOLN
200.0000 ug | INTRAVENOUS | Status: DC | PRN
  Administered 2014-12-29: 0.3 ug/kg/h via INTRAVENOUS

## 2014-12-29 MED ORDER — ASPIRIN 81 MG PO CHEW
324.0000 mg | CHEWABLE_TABLET | Freq: Every day | ORAL | Status: DC
  Filled 2014-12-29: qty 4

## 2014-12-29 MED ORDER — ACETAMINOPHEN 160 MG/5ML PO SOLN: 650.0000 mg | Freq: Once | ORAL | Status: DC

## 2014-12-29 MED ORDER — MORPHINE SULFATE 2 MG/ML IJ SOLN
2.0000 mg | INTRAMUSCULAR | Status: DC | PRN
  Administered 2014-12-29 – 2014-12-30 (×4): 2 mg via INTRAVENOUS
  Filled 2014-12-29 (×4): qty 1

## 2014-12-29 MED ORDER — METOPROLOL TARTRATE 1 MG/ML IV SOLN: 2.5000 mg | INTRAVENOUS | Status: DC | PRN

## 2014-12-29 MED ORDER — LACTATED RINGERS IV SOLN
INTRAVENOUS | Status: DC | PRN
Start: 2014-12-29 — End: 2014-12-29
  Administered 2014-12-29: 07:00:00 via INTRAVENOUS

## 2014-12-29 MED ORDER — DEXTROSE 5 % IV SOLN
0.0000 ug/min | INTRAVENOUS | Status: DC
  Administered 2014-12-30: 100 ug/min via INTRAVENOUS
  Filled 2014-12-29 (×3): qty 2

## 2014-12-29 MED ORDER — POTASSIUM CHLORIDE 10 MEQ/50ML IV SOLN: 10.0000 meq | INTRAVENOUS | Status: AC

## 2014-12-29 MED ORDER — MIDAZOLAM HCL 10 MG/2ML IJ SOLN
INTRAMUSCULAR | Status: AC
  Filled 2014-12-29: qty 2

## 2014-12-29 MED ORDER — ALBUMIN HUMAN 5 % IV SOLN
INTRAVENOUS | Status: DC | PRN
  Administered 2014-12-29: 13:00:00 via INTRAVENOUS

## 2014-12-29 MED ORDER — EPHEDRINE SULFATE 50 MG/ML IJ SOLN
INTRAMUSCULAR | Status: DC | PRN
  Administered 2014-12-29: 10 mg via INTRAVENOUS
  Administered 2014-12-29: 15 mg via INTRAVENOUS

## 2014-12-29 MED ORDER — LACTATED RINGERS IV SOLN: INTRAVENOUS | Status: DC

## 2014-12-29 MED ORDER — INSULIN REGULAR BOLUS VIA INFUSION
0.0000 [IU] | Freq: Three times a day (TID) | INTRAVENOUS | Status: DC
  Administered 2014-12-30: 0.1 [IU] via INTRAVENOUS
  Filled 2014-12-29: qty 10

## 2014-12-29 MED ORDER — NITROGLYCERIN IN D5W 200-5 MCG/ML-% IV SOLN
INTRAVENOUS | Status: DC | PRN
  Administered 2014-12-29: 10 ug/min via INTRAVENOUS

## 2014-12-29 MED ORDER — FAMOTIDINE IN NACL 20-0.9 MG/50ML-% IV SOLN
20.0000 mg | Freq: Two times a day (BID) | INTRAVENOUS | Status: AC
  Administered 2014-12-29: 20 mg via INTRAVENOUS

## 2014-12-29 MED ORDER — MAGNESIUM SULFATE 4 GM/100ML IV SOLN
4.0000 g | Freq: Once | INTRAVENOUS | Status: DC
  Filled 2014-12-29: qty 100

## 2014-12-29 MED ORDER — HEMOSTATIC AGENTS (NO CHARGE) OPTIME
TOPICAL | Status: DC | PRN
  Administered 2014-12-29: 1 via TOPICAL

## 2014-12-29 MED ORDER — KETOROLAC TROMETHAMINE 15 MG/ML IJ SOLN
15.0000 mg | Freq: Four times a day (QID) | INTRAMUSCULAR | Status: AC
  Administered 2014-12-29 – 2014-12-30 (×2): 15 mg via INTRAVENOUS
  Filled 2014-12-29 (×2): qty 1

## 2014-12-29 MED ORDER — SODIUM CHLORIDE 0.9 % IJ SOLN
INTRAMUSCULAR | Status: AC
  Filled 2014-12-29: qty 10

## 2014-12-29 MED FILL — Electrolyte-R (PH 7.4) Solution: INTRAVENOUS | Qty: 4000 | Status: AC

## 2014-12-29 MED FILL — Sodium Chloride IV Soln 0.9%: INTRAVENOUS | Qty: 3000 | Status: AC

## 2014-12-29 MED FILL — Mannitol IV Soln 20%: INTRAVENOUS | Qty: 500 | Status: AC

## 2014-12-29 MED FILL — Lidocaine HCl IV Inj 20 MG/ML: INTRAVENOUS | Qty: 5 | Status: AC

## 2014-12-29 MED FILL — Sodium Bicarbonate IV Soln 8.4%: INTRAVENOUS | Qty: 50 | Status: AC

## 2014-12-29 MED FILL — Heparin Sodium (Porcine) Inj 1000 Unit/ML: INTRAMUSCULAR | Qty: 20 | Status: AC

## 2014-12-29 SURGICAL SUPPLY — 99 items
ADAPTER CARDIO PERF ANTE/RETRO (ADAPTER) ×4 IMPLANT
ADPR PRFSN 84XANTGRD RTRGD (ADAPTER) ×2
APL SKNCLS STERI-STRIP NONHPOA (GAUZE/BANDAGES/DRESSINGS) ×1
ATTRACTOMAT 16X20 MAGNETIC DRP (DRAPES) ×4 IMPLANT
BAG DECANTER FOR FLEXI CONT (MISCELLANEOUS) ×4 IMPLANT
BANDAGE ELASTIC 4 VELCRO ST LF (GAUZE/BANDAGES/DRESSINGS) ×4 IMPLANT
BANDAGE ELASTIC 6 VELCRO ST LF (GAUZE/BANDAGES/DRESSINGS) ×4 IMPLANT
BASKET HEART  (ORDER IN 25'S) (MISCELLANEOUS) ×1
BASKET HEART (ORDER IN 25'S) (MISCELLANEOUS) ×1
BASKET HEART (ORDER IN 25S) (MISCELLANEOUS) ×2 IMPLANT
BENZOIN TINCTURE PRP APPL 2/3 (GAUZE/BANDAGES/DRESSINGS) ×4 IMPLANT
BLADE STERNUM SYSTEM 6 (BLADE) ×4 IMPLANT
BLADE SURG 11 STRL SS (BLADE) ×4 IMPLANT
BLADE SURG 12 STRL SS (BLADE) ×4 IMPLANT
BLADE SURG ROTATE 9660 (MISCELLANEOUS) IMPLANT
BNDG GAUZE ELAST 4 BULKY (GAUZE/BANDAGES/DRESSINGS) ×4 IMPLANT
CABLE PACING FASLOC BLUE (MISCELLANEOUS) ×4 IMPLANT
CANISTER SUCTION 2500CC (MISCELLANEOUS) ×4 IMPLANT
CANNULA ARTERIAL NVNT 3/8 22FR (MISCELLANEOUS) ×4 IMPLANT
CANNULA GUNDRY RCSP 15FR (MISCELLANEOUS) ×4 IMPLANT
CATH CPB KIT VANTRIGT (MISCELLANEOUS) ×4 IMPLANT
CATH ROBINSON RED A/P 18FR (CATHETERS) ×16 IMPLANT
CATH THORACIC 36FR RT ANG (CATHETERS) ×4 IMPLANT
CLIP FOGARTY SPRING 6M (CLIP) ×4 IMPLANT
CLIP TI WIDE RED SMALL 24 (CLIP) ×16 IMPLANT
CLOSURE WOUND 1/2 X4 (GAUZE/BANDAGES/DRESSINGS) ×1
COVER SURGICAL LIGHT HANDLE (MISCELLANEOUS) ×4 IMPLANT
CRADLE DONUT ADULT HEAD (MISCELLANEOUS) ×4 IMPLANT
DRAIN CHANNEL 32F RND 10.7 FF (WOUND CARE) ×4 IMPLANT
DRAPE CARDIOVASCULAR INCISE (DRAPES) ×4
DRAPE SLUSH/WARMER DISC (DRAPES) ×4 IMPLANT
DRAPE SRG 135X102X78XABS (DRAPES) ×2 IMPLANT
DRSG AQUACEL AG ADV 3.5X14 (GAUZE/BANDAGES/DRESSINGS) ×4 IMPLANT
ELECT BLADE 4.0 EZ CLEAN MEGAD (MISCELLANEOUS) ×4
ELECT BLADE 6.5 EXT (BLADE) ×4 IMPLANT
ELECT CAUTERY BLADE 6.4 (BLADE) ×4 IMPLANT
ELECT REM PT RETURN 9FT ADLT (ELECTROSURGICAL) ×8
ELECTRODE BLDE 4.0 EZ CLN MEGD (MISCELLANEOUS) ×2 IMPLANT
ELECTRODE REM PT RTRN 9FT ADLT (ELECTROSURGICAL) ×4 IMPLANT
GAUZE SPONGE 4X4 12PLY STRL (GAUZE/BANDAGES/DRESSINGS) ×8 IMPLANT
GLOVE BIO SURGEON STRL SZ 6.5 (GLOVE) ×6 IMPLANT
GLOVE BIO SURGEON STRL SZ7.5 (GLOVE) ×12 IMPLANT
GLOVE BIO SURGEONS STRL SZ 6.5 (GLOVE) ×2
GOWN STRL REUS W/ TWL LRG LVL3 (GOWN DISPOSABLE) ×8 IMPLANT
GOWN STRL REUS W/TWL LRG LVL3 (GOWN DISPOSABLE) ×16
HEMOSTAT POWDER SURGIFOAM 1G (HEMOSTASIS) ×12 IMPLANT
HEMOSTAT SURGICEL 2X14 (HEMOSTASIS) ×4 IMPLANT
INSERT FOGARTY XLG (MISCELLANEOUS) ×4 IMPLANT
KIT BASIN OR (CUSTOM PROCEDURE TRAY) ×4 IMPLANT
KIT ROOM TURNOVER OR (KITS) ×4 IMPLANT
KIT SUCTION CATH 14FR (SUCTIONS) ×4 IMPLANT
KIT VASOVIEW W/TROCAR VH 2000 (KITS) ×4 IMPLANT
LEAD PACING MYOCARDI (MISCELLANEOUS) ×4 IMPLANT
MARKER GRAFT CORONARY BYPASS (MISCELLANEOUS) ×12 IMPLANT
NS IRRIG 1000ML POUR BTL (IV SOLUTION) ×20 IMPLANT
PACK OPEN HEART (CUSTOM PROCEDURE TRAY) ×4 IMPLANT
PAD ARMBOARD 7.5X6 YLW CONV (MISCELLANEOUS) ×8 IMPLANT
PAD ELECT DEFIB RADIOL ZOLL (MISCELLANEOUS) ×4 IMPLANT
PENCIL BUTTON HOLSTER BLD 10FT (ELECTRODE) ×4 IMPLANT
PUNCH AORTIC ROTATE  4.5MM 8IN (MISCELLANEOUS) ×4 IMPLANT
PUNCH AORTIC ROTATE 4.0MM (MISCELLANEOUS) IMPLANT
PUNCH AORTIC ROTATE 4.5MM 8IN (MISCELLANEOUS) IMPLANT
PUNCH AORTIC ROTATE 5MM 8IN (MISCELLANEOUS) IMPLANT
SET CARDIOPLEGIA MPS 5001102 (MISCELLANEOUS) ×4 IMPLANT
STRIP CLOSURE SKIN 1/2X4 (GAUZE/BANDAGES/DRESSINGS) ×3 IMPLANT
SURGIFLO W/THROMBIN 8M KIT (HEMOSTASIS) ×4 IMPLANT
SUT BONE WAX W31G (SUTURE) ×4 IMPLANT
SUT MNCRL AB 4-0 PS2 18 (SUTURE) ×4 IMPLANT
SUT PROLENE 3 0 SH DA (SUTURE) IMPLANT
SUT PROLENE 3 0 SH1 36 (SUTURE) IMPLANT
SUT PROLENE 4 0 RB 1 (SUTURE) ×4
SUT PROLENE 4 0 SH DA (SUTURE) ×4 IMPLANT
SUT PROLENE 4-0 RB1 .5 CRCL 36 (SUTURE) ×2 IMPLANT
SUT PROLENE 5 0 C 1 36 (SUTURE) ×4 IMPLANT
SUT PROLENE 6 0 C 1 30 (SUTURE) ×8 IMPLANT
SUT PROLENE 6 0 CC (SUTURE) ×36 IMPLANT
SUT PROLENE 8 0 BV175 6 (SUTURE) IMPLANT
SUT PROLENE BLUE 7 0 (SUTURE) ×8 IMPLANT
SUT SILK  1 MH (SUTURE)
SUT SILK 1 MH (SUTURE) IMPLANT
SUT SILK 2 0 SH CR/8 (SUTURE) IMPLANT
SUT SILK 3 0 SH CR/8 (SUTURE) IMPLANT
SUT STEEL 6MS V (SUTURE) ×8 IMPLANT
SUT STEEL SZ 6 DBL 3X14 BALL (SUTURE) ×4 IMPLANT
SUT VIC AB 1 CTX 36 (SUTURE) ×8
SUT VIC AB 1 CTX36XBRD ANBCTR (SUTURE) ×4 IMPLANT
SUT VIC AB 2-0 CT1 27 (SUTURE) ×4
SUT VIC AB 2-0 CT1 TAPERPNT 27 (SUTURE) ×2 IMPLANT
SUT VIC AB 2-0 CTX 27 (SUTURE) IMPLANT
SUT VIC AB 3-0 X1 27 (SUTURE) IMPLANT
SUTURE E-PAK OPEN HEART (SUTURE) ×4 IMPLANT
SYSTEM SAHARA CHEST DRAIN ATS (WOUND CARE) ×4 IMPLANT
TAPE CLOTH SURG 4X10 WHT LF (GAUZE/BANDAGES/DRESSINGS) ×4 IMPLANT
TOWEL OR 17X24 6PK STRL BLUE (TOWEL DISPOSABLE) ×8 IMPLANT
TOWEL OR 17X26 10 PK STRL BLUE (TOWEL DISPOSABLE) ×8 IMPLANT
TRAY FOLEY IC TEMP SENS 16FR (CATHETERS) ×4 IMPLANT
TUBING INSUFFLATION (TUBING) ×4 IMPLANT
UNDERPAD 30X30 INCONTINENT (UNDERPADS AND DIAPERS) ×4 IMPLANT
WATER STERILE IRR 1000ML POUR (IV SOLUTION) ×8 IMPLANT

## 2014-12-29 NOTE — OR Nursing (Signed)
First call to SICU at 1240.

## 2014-12-29 NOTE — Brief Op Note (Signed)
12/25/2014 - 12/29/2014  11:51 AM  PATIENT:  Flonnie Hailstone  69 y.o. male  PRE-OPERATIVE DIAGNOSIS:  CORONARY ARTERY DISEASE  POST-OPERATIVE DIAGNOSIS:  CORONARY ARTERY DISEASE  PROCEDURE:   CORONARY ARTERY BYPASS GRAFTING x 4 (LIMA-LAD, SVG-OM, SVG-Ramus, SVG-PD) ENDOSCOPIC VEIN HARVEST RIGHT LEG  SURGEON:  Ivin Poot, MD  ASSISTANT: Suzzanne Cloud, PA-C  ANESTHESIA:   general  PATIENT CONDITION:  ICU - intubated and hemodynamically stable.  PRE-OPERATIVE WEIGHT: 126 kg

## 2014-12-29 NOTE — OR Nursing (Signed)
Second call to SICU at 1308.

## 2014-12-29 NOTE — Progress Notes (Signed)
  Echocardiogram Echocardiogram Transesophageal has been performed.  Devin [name] 12/29/2014, 8:42 AM

## 2014-12-29 NOTE — Anesthesia Preprocedure Evaluation (Signed)
Anesthesia Evaluation  Patient identified by MRN, date of birth, ID band Patient awake    Reviewed: Allergy & Precautions, NPO status , Patient's Chart, lab work & pertinent test results  Airway Mallampati: II  TM Distance: >3 FB Neck ROM: Full    Dental no notable dental hx.    Pulmonary sleep apnea ,  breath sounds clear to auscultation  Pulmonary exam normal       Cardiovascular hypertension, Pt. on medications + Past MI Rhythm:Regular Rate:Normal     Neuro/Psych negative neurological ROS  negative psych ROS   GI/Hepatic negative GI ROS, Neg liver ROS,   Endo/Other  negative endocrine ROS  Renal/GU negative Renal ROS  negative genitourinary   Musculoskeletal negative musculoskeletal ROS (+)   Abdominal   Peds negative pediatric ROS (+)  Hematology negative hematology ROS (+)   Anesthesia Other Findings   Reproductive/Obstetrics negative OB ROS                             Anesthesia Physical Anesthesia Plan  ASA: III  Anesthesia Plan: General   Post-op Pain Management:    Induction: Intravenous  Airway Management Planned: Oral ETT  Additional Equipment: Arterial line, Ultrasound Guidance Line Placement, TEE and PA Cath  Intra-op Plan:   Post-operative Plan: Extubation in OR  Informed Consent: I have reviewed the patients History and Physical, chart, labs and discussed the procedure including the risks, benefits and alternatives for the proposed anesthesia with the patient or authorized representative who has indicated his/her understanding and acceptance.   Dental advisory given  Plan Discussed with: CRNA and Surgeon  Anesthesia Plan Comments:         Anesthesia Quick Evaluation

## 2014-12-29 NOTE — Procedures (Signed)
Extubation Procedure Note  Patient Details:   Name: ROCKWELL ZENTZ DOB: January 12, 1946 MRN: [id number]   Airway Documentation:     Evaluation  O2 sats: stable throughout Complications: No apparent complications Patient did tolerate procedure well. Bilateral Breath Sounds: Clear, Diminished   Yes  NIF-60 FVC-927ml IS instructed 762ml  Revonda Standard 12/29/2014, 6:02 PM

## 2014-12-29 NOTE — Transfer of Care (Signed)
Immediate Anesthesia Transfer of Care Note  Patient: Devin [name]  Procedure(s) Performed: Procedure(s): CORONARY ARTERY BYPASS GRAFTING (CABG), ON PUMP, TIMES FOUR, USING LEFT INTERNAL MAMMARY ARTERY, RIGHT GREATER SAPHENOUS VEIN HARVESTED ENDOSCOPICALLY (N/A) TRANSESOPHAGEAL ECHOCARDIOGRAM (TEE) (N/A)  Patient Location: SICU  Anesthesia Type:General  Level of Consciousness: sedated and Patient remains intubated per anesthesia plan  Airway & Oxygen Therapy: Patient remains intubated per anesthesia plan and Patient placed on Ventilator (see vital sign flow sheet for setting)  Post-op Assessment: Report given to RN and Post -op Vital signs reviewed and stable  Post vital signs: Reviewed and stable  Last Vitals:  Filed Vitals:   12/29/14 0440  BP: 134/71  Pulse:   Temp:   Resp: 13    Complications: No apparent anesthesia complications

## 2014-12-29 NOTE — Progress Notes (Signed)
The patient was examined and preop studies reviewed. There has been no change from the prior exam and the patient is ready for surgery.   Plan CABG on Devin [name] today

## 2014-12-29 NOTE — Anesthesia Procedure Notes (Signed)
Procedure Name: Intubation Date/Time: 12/29/2014 7:59 AM Performed by: Trixie Deis A Pre-anesthesia Checklist: Patient identified, Emergency Drugs available, Timeout performed, Suction available and Patient being monitored Patient Re-evaluated:Patient Re-evaluated prior to inductionOxygen Delivery Method: Circle system utilized Preoxygenation: Pre-oxygenation with 100% oxygen Intubation Type: IV induction Ventilation: Two handed mask ventilation required and Oral airway inserted - appropriate to patient size Grade View: Grade I Tube type: Oral Tube size: 8.0 mm Number of attempts: 1 Airway Equipment and Method: Video-laryngoscopy and Rigid stylet Placement Confirmation: ETT inserted through vocal cords under direct vision,  breath sounds checked- equal and bilateral and positive ETCO2 Secured at: 23 cm Tube secured with: Tape Dental Injury: Teeth and Oropharynx as per pre-operative assessment  Difficulty Due To: Difficulty was anticipated Comments: Hx cervical fusion, large tongue, short neck- 1st attempt with glidescope successful.

## 2014-12-29 NOTE — Progress Notes (Signed)
CT surgery p.m. Rounds  Status post CABG 4  - non-ST elevation MI Starting to wean from ventilator Atrially pacing with stable hemodynamics Minimal chest tube drainage Urine output adequate Continue current postop  care

## 2014-12-30 ENCOUNTER — Inpatient Hospital Stay (HOSPITAL_COMMUNITY): Payer: Medicare Other

## 2014-12-30 ENCOUNTER — Encounter (HOSPITAL_COMMUNITY): Payer: Self-pay | Admitting: Cardiothoracic Surgery

## 2014-12-30 LAB — CARBOXYHEMOGLOBIN
Carboxyhemoglobin: 1.5 % (ref 0.5–1.5)
Carboxyhemoglobin: 1.6 % — ABNORMAL HIGH (ref 0.5–1.5)
Methemoglobin: 1 % (ref 0.0–1.5)
Methemoglobin: 1.2 % (ref 0.0–1.5)
O2 Saturation: 71.6 %
O2 Saturation: 83.8 %
Total hemoglobin: 11.6 g/dL — ABNORMAL LOW (ref 13.5–18.0)
Total hemoglobin: 12.5 g/dL — ABNORMAL LOW (ref 13.5–18.0)

## 2014-12-30 LAB — HEMOGLOBIN A1C
Hgb A1c MFr Bld: 5.7 % — ABNORMAL HIGH (ref 4.8–5.6)
Mean Plasma Glucose: 117 mg/dL

## 2014-12-30 LAB — POCT I-STAT, CHEM 8
BUN: 17 mg/dL (ref 6–23)
Calcium, Ion: 1.2 mmol/L (ref 1.13–1.30)
Chloride: 99 mmol/L (ref 96–112)
Creatinine, Ser: 1.2 mg/dL (ref 0.50–1.35)
Glucose, Bld: 145 mg/dL — ABNORMAL HIGH (ref 70–99)
HEMATOCRIT: 39 % (ref 39.0–52.0)
Hemoglobin: 13.3 g/dL (ref 13.0–17.0)
POTASSIUM: 4.3 mmol/L (ref 3.5–5.1)
Sodium: 136 mmol/L (ref 135–145)
TCO2: 23 mmol/L (ref 0–100)

## 2014-12-30 LAB — GLUCOSE, CAPILLARY
GLUCOSE-CAPILLARY: 115 mg/dL — AB (ref 70–99)
GLUCOSE-CAPILLARY: 118 mg/dL — AB (ref 70–99)
GLUCOSE-CAPILLARY: 120 mg/dL — AB (ref 70–99)
GLUCOSE-CAPILLARY: 122 mg/dL — AB (ref 70–99)
GLUCOSE-CAPILLARY: 123 mg/dL — AB (ref 70–99)
GLUCOSE-CAPILLARY: 124 mg/dL — AB (ref 70–99)
GLUCOSE-CAPILLARY: 125 mg/dL — AB (ref 70–99)
GLUCOSE-CAPILLARY: 129 mg/dL — AB (ref 70–99)
GLUCOSE-CAPILLARY: 133 mg/dL — AB (ref 70–99)
GLUCOSE-CAPILLARY: 137 mg/dL — AB (ref 70–99)
GLUCOSE-CAPILLARY: 149 mg/dL — AB (ref 70–99)
GLUCOSE-CAPILLARY: 84 mg/dL (ref 70–99)
GLUCOSE-CAPILLARY: 84 mg/dL (ref 70–99)
Glucose-Capillary: 101 mg/dL — ABNORMAL HIGH (ref 70–99)
Glucose-Capillary: 108 mg/dL — ABNORMAL HIGH (ref 70–99)
Glucose-Capillary: 109 mg/dL — ABNORMAL HIGH (ref 70–99)
Glucose-Capillary: 114 mg/dL — ABNORMAL HIGH (ref 70–99)
Glucose-Capillary: 128 mg/dL — ABNORMAL HIGH (ref 70–99)
Glucose-Capillary: 142 mg/dL — ABNORMAL HIGH (ref 70–99)
Glucose-Capillary: 103 mg/dL — ABNORMAL HIGH (ref 70–99)
Glucose-Capillary: 110 mg/dL — ABNORMAL HIGH (ref 70–99)
Glucose-Capillary: 112 mg/dL — ABNORMAL HIGH (ref 70–99)
Glucose-Capillary: 117 mg/dL — ABNORMAL HIGH (ref 70–99)
Glucose-Capillary: 124 mg/dL — ABNORMAL HIGH (ref 70–99)
Glucose-Capillary: 143 mg/dL — ABNORMAL HIGH (ref 70–99)

## 2014-12-30 LAB — CBC
HCT: 36.7 % — ABNORMAL LOW (ref 39.0–52.0)
HEMATOCRIT: 37.6 % — AB (ref 39.0–52.0)
Hemoglobin: 12.3 g/dL — ABNORMAL LOW (ref 13.0–17.0)
Hemoglobin: 12.3 g/dL — ABNORMAL LOW (ref 13.0–17.0)
MCH: 32.5 pg (ref 26.0–34.0)
MCH: 32 pg (ref 26.0–34.0)
MCHC: 33.5 g/dL (ref 30.0–36.0)
MCHC: 32.7 g/dL (ref 30.0–36.0)
MCV: 97.1 fL (ref 78.0–100.0)
MCV: 97.9 fL (ref 78.0–100.0)
Platelets: 128 10*3/uL — ABNORMAL LOW (ref 150–400)
Platelets: 123 10*3/uL — ABNORMAL LOW (ref 150–400)
RBC: 3.78 MIL/uL — AB (ref 4.22–5.81)
RBC: 3.84 MIL/uL — AB (ref 4.22–5.81)
RDW: 13.8 % (ref 11.5–15.5)
RDW: 14 % (ref 11.5–15.5)
WBC: 9.5 10*3/uL (ref 4.0–10.5)
WBC: 8.9 10*3/uL (ref 4.0–10.5)

## 2014-12-30 LAB — POCT I-STAT 3, ART BLOOD GAS (G3+)
ACID-BASE DEFICIT: 1 mmol/L (ref 0.0–2.0)
ACID-BASE DEFICIT: 2 mmol/L (ref 0.0–2.0)
Acid-base deficit: 2 mmol/L (ref 0.0–2.0)
Acid-base deficit: 3 mmol/L — ABNORMAL HIGH (ref 0.0–2.0)
BICARBONATE: 24.5 meq/L — AB (ref 20.0–24.0)
BICARBONATE: 25.1 meq/L — AB (ref 20.0–24.0)
BICARBONATE: 23.5 meq/L (ref 20.0–24.0)
Bicarbonate: 24.6 mEq/L — ABNORMAL HIGH (ref 20.0–24.0)
O2 SAT: 94 %
O2 SAT: 93 %
O2 Saturation: 94 %
O2 Saturation: 97 %
PO2 ART: 72 mmHg — AB (ref 80.0–100.0)
PO2 ART: 76 mmHg — AB (ref 80.0–100.0)
PO2 ART: 78 mmHg — AB (ref 80.0–100.0)
Patient temperature: 36.6
Patient temperature: 37
Patient temperature: 37.2
Patient temperature: 98.4
TCO2: 25 mmol/L (ref 0–100)
TCO2: 26 mmol/L (ref 0–100)
TCO2: 26 mmol/L (ref 0–100)
TCO2: 27 mmol/L (ref 0–100)
pCO2 arterial: 44.7 mmHg (ref 35.0–45.0)
pCO2 arterial: 44.8 mmHg (ref 35.0–45.0)
pCO2 arterial: 45.9 mmHg — ABNORMAL HIGH (ref 35.0–45.0)
pCO2 arterial: 50 mmHg — ABNORMAL HIGH (ref 35.0–45.0)
pH, Arterial: 7.309 — ABNORMAL LOW (ref 7.350–7.450)
pH, Arterial: 7.327 — ABNORMAL LOW (ref 7.350–7.450)
pH, Arterial: 7.335 — ABNORMAL LOW (ref 7.350–7.450)
pH, Arterial: 7.349 — ABNORMAL LOW (ref 7.350–7.450)
pO2, Arterial: 91 mmHg (ref 80.0–100.0)

## 2014-12-30 LAB — BASIC METABOLIC PANEL
Anion gap: 4 — ABNORMAL LOW (ref 5–15)
BUN: 13 mg/dL (ref 6–23)
CO2: 25 mmol/L (ref 19–32)
Calcium: 7.8 mg/dL — ABNORMAL LOW (ref 8.4–10.5)
Chloride: 104 mmol/L (ref 96–112)
Creatinine, Ser: 1.2 mg/dL (ref 0.50–1.35)
GFR calc Af Amer: 70 mL/min — ABNORMAL LOW (ref >90)
GFR, EST NON AFRICAN AMERICAN: 60 mL/min — AB (ref >90)
GLUCOSE: 125 mg/dL — AB (ref 70–99)
POTASSIUM: 4 mmol/L (ref 3.5–5.1)
Sodium: 133 mmol/L — ABNORMAL LOW (ref 135–145)

## 2014-12-30 LAB — CREATININE, SERUM
Creatinine, Ser: 1.27 mg/dL (ref 0.50–1.35)
GFR calc non Af Amer: 56 mL/min — ABNORMAL LOW (ref >90)
GFR, EST AFRICAN AMERICAN: 65 mL/min — AB (ref >90)

## 2014-12-30 LAB — MAGNESIUM
MAGNESIUM: 2.4 mg/dL (ref 1.5–2.5)
Magnesium: 2.5 mg/dL (ref 1.5–2.5)

## 2014-12-30 MED ORDER — NOREPINEPHRINE BITARTRATE 1 MG/ML IV SOLN
0.0000 ug/min | INTRAVENOUS | Status: DC
  Administered 2014-12-30: 2 ug/min via INTRAVENOUS
  Filled 2014-12-30: qty 16

## 2014-12-30 MED ORDER — INSULIN DETEMIR 100 UNIT/ML ~~LOC~~ SOLN
10.0000 [IU] | Freq: Two times a day (BID) | SUBCUTANEOUS | Status: DC
  Administered 2014-12-30 (×2): 10 [IU] via SUBCUTANEOUS
  Filled 2014-12-30 (×3): qty 0.1

## 2014-12-30 MED ORDER — METOCLOPRAMIDE HCL 5 MG/ML IJ SOLN
10.0000 mg | Freq: Four times a day (QID) | INTRAMUSCULAR | Status: AC
  Administered 2014-12-30 (×4): 10 mg via INTRAVENOUS
  Filled 2014-12-30 (×4): qty 2

## 2014-12-30 MED ORDER — DOPAMINE-DEXTROSE 3.2-5 MG/ML-% IV SOLN
INTRAVENOUS | Status: AC
  Administered 2014-12-30: 2.5 ug/kg/min via INTRAVENOUS
  Filled 2014-12-30: qty 250

## 2014-12-30 MED ORDER — FENTANYL CITRATE 0.05 MG/ML IJ SOLN
50.0000 ug | INTRAMUSCULAR | Status: DC | PRN
  Administered 2014-12-30 (×2): 50 ug via INTRAVENOUS
  Filled 2014-12-30 (×2): qty 2

## 2014-12-30 MED ORDER — CALCIUM CHLORIDE 10 % IV SOLN
1.0000 g | Freq: Once | INTRAVENOUS | Status: AC
  Administered 2014-12-30: 1 g via INTRAVENOUS
  Filled 2014-12-30: qty 10

## 2014-12-30 MED ORDER — ALBUMIN HUMAN 5 % IV SOLN: 12.5000 g | Freq: Once | INTRAVENOUS | Status: DC

## 2014-12-30 MED ORDER — INSULIN ASPART 100 UNIT/ML ~~LOC~~ SOLN
0.0000 [IU] | SUBCUTANEOUS | Status: DC
  Administered 2014-12-30 – 2014-12-31 (×4): 2 [IU] via SUBCUTANEOUS

## 2014-12-30 MED ORDER — FUROSEMIDE 10 MG/ML IJ SOLN
20.0000 mg | Freq: Once | INTRAMUSCULAR | Status: AC
  Administered 2014-12-30: 20 mg via INTRAVENOUS
  Filled 2014-12-30: qty 2

## 2014-12-30 MED ORDER — DOPAMINE-DEXTROSE 3.2-5 MG/ML-% IV SOLN
2.5000 ug/kg/min | INTRAVENOUS | Status: DC
  Administered 2014-12-30 (×2): 2.5 ug/kg/min via INTRAVENOUS

## 2014-12-30 NOTE — Progress Notes (Signed)
Spoke with Dr. Prescott Gum regarding SBP in uppers 70s-mid 80s with a MAP in the upper 40s-mid 50s. Updated on drip rates, urine output, chest tube output, indexes, volume given, EKG changes (EKG done,) and overall status. Orders received for albumin with calcium, start Dopamine at 2.34mcg, coox, ABG, and to start Levophed 5-47mcg if needed. Also updated on swan inconsistent waveform and numbers. Order received to pull swan back 2cm. No change noted to waveform. Order received to discontinue swan. Will continue to closely monitor. Richarda Blade RN

## 2014-12-30 NOTE — Progress Notes (Signed)
T[facility] BRIEF SICU PROGRESS NOTE  1 Day Post-Op  S/P Procedure(s) (LRB): CORONARY ARTERY BYPASS GRAFTING (CABG), ON PUMP, TIMES FOUR, USING LEFT INTERNAL MAMMARY ARTERY, RIGHT GREATER SAPHENOUS VEIN HARVESTED ENDOSCOPICALLY (N/A) TRANSESOPHAGEAL ECHOCARDIOGRAM (TEE) (N/A)   Stable day NSR w/ stable BP w/ levophed drip down to 3 mcg/min O2 sats 93-94% on 4 L/min UOP adequate  Plan: Continue current plan  [name] 12/30/2014 6:32 PM

## 2014-12-30 NOTE — Progress Notes (Signed)
EKG CRITICAL VALUE     12 lead EKG performed.  Critical value noted.  Richarda Blade, RN notified.   Melissa Montane, CCT 12/30/2014 6:51 AM

## 2014-12-30 NOTE — Op Note (Signed)
NAME:  Devin [name], Devin [name] NO.:  [id number]  MEDICAL RECORD NO.:  [id number]  LOCATION:  2S02C                        FACILITY:  [facility]  PHYSICIAN:  Ivin Poot, M.D.  DATE OF BIRTH:  01-14-1946  DATE OF PROCEDURE: DATE OF DISCHARGE:                              OPERATIVE REPORT   OPERATION: 1. Coronary artery bypass grafting x4 (left internal mammary artery to     LAD, saphenous vein graft to OM1, saphenous vein graft to OM2,     saphenous vein graft to the posterior descending artery). 2. Endoscopic harvest right leg greater saphenous vein.  SURGEON:  Ivin Poot, M.D.  ASSISTANT:  Suzzanne Cloud, PA-C.  PREOPERATIVE DIAGNOSES:  Class 4 unstable angina, non-ST elevation myocardial infarction, severe three-vessel coronary artery disease.  POSTOPERATIVE DIAGNOSES:  Class 4 unstable angina, non-ST elevation myocardial infarction, severe three-vessel coronary artery disease.  ANESTHESIA:  General by Dr. Kalman Shan.  INDICATION:  The patient is a 69 year old gentleman who presented to the emergency department with chest pain and positive cardiac enzymes.  He was admitted to the hospital.  He was treated with IV heparin and nitroglycerin.  He underwent cardiac catheterization.  This showed severe 3-vessel coronary disease with high-grade stenosis of a dominant right coronary, high-grade stenosis of the LAD, and moderate to high- grade stenosis of the circumflex.  Surgical coronary revascularization was recommended.  I discussed CABG with the patient and his family in great detail with respect to the indications, expected benefits, alternatives, and risks.  We discussed the major aspects of surgery including use of general anesthesia, cardiopulmonary bypass, and expected postoperative hospital recovery.  I reviewed the potential risks of surgery including risks of stroke, MI, bleeding requiring transfusion, postoperative pulmonary problems including  pleural effusion, arrhythmias, infection, and death.  After reviewing these issues, he demonstrated his understanding and agreed to proceed with surgery under what I felt was an informed consent.  FINDINGS: 1. Obese body habitus made exposure of the heart difficult. 2. Adequate conduit with some dilatation of the thigh vein. 3. No blood products required for the surgery. 4. No inotropes required for the operation and LV function was normal     after separation from cardiopulmonary bypass.  OPERATIVE PROCEDURE:  The patient was brought to the operating room, placed supine on the operating table.  General anesthesia was induced under invasive hemodynamic monitoring.  The chest, abdomen, and legs were prepped with Betadine and draped as a sterile field.  A proper time- out was performed.  A sternal incision was made as the saphenous vein was harvested endoscopically from the right leg.  The left internal mammary artery was harvested as a pedicle graft from its origin at the subclavian vessels.  The sternal retractor was placed using the deep blades.  The pericardium was opened and suspended.  Aorta was inspected and palpated and had no significant calcium or plaque.  Pursestrings were placed in the ascending aorta and right atrium and heparin was administered.  When the ACT was documented as being therapeutic, the patient was cannulated and placed on cardiopulmonary bypass.  The coronary arteries were identified for grafting and adequate targets were found.  Cardioplegia  cannulas were placed for both antegrade and retrograde cold blood cardioplegia.  The patient was cooled to 32 degrees.  The aortic crossclamp was applied.  One liter of cold blood cardioplegia was delivered in split doses between the antegrade aortic and retrograde coronary sinus catheters.  There was good cardioplegic arrest and septal temperature dropped less than 14 degrees. Cardioplegia was delivered every 20 minutes  or less while the crossclamp was in place.  The distal coronary anastomoses were performed.  First distal anastomosis was the posterior descending branch of the right coronary. This had a proximal 95% stenosis.  A reverse saphenous vein was sewn end- to-side with running 7-0 Prolene with good flow through graft. Cardioplegia was redosed.  The second distal anastomosis was the OM2 branch of the circumflex. This had diffuse calcified plaque.  It was a 1.75-mm vessel.  A reverse saphenous vein was sewn end-to-side with a running 7-0 Prolene with excellent flow through the graft.  Cardioplegia was redosed.  The third distal anastomosis was to the 1st marginal branch of the circumflex.  This was a smaller 1.5-mm vessel, proximal 90% stenosis.  A reverse saphenous vein was sewn end-to-side with a running 7-0 Prolene with good flow through graft.  Cardioplegia was redosed.  The fourth distal anastomosis was to the mid distal third of the LAD. The LAD had a tight 95% proximal stenosis and tandem.  There is also some diffuse distal disease close to the apex.  The optimal place of the anastomosis was selected and the mammary artery was brought through an opening in the left lateral pericardium, was brought down onto the LAD and sewn end-to-side with running 8-0 Prolene.  There was excellent flow through the anastomosis after briefly releasing the pedicle bulldog on the mammary artery.  The bulldog was reapplied and the pedicle was secured at the epicardium.  Cardioplegia was redosed.  The cross-clamp still in place.  3 proximal vein anastomoses were performed on the ascending aorta with a 4.5 mm punch and running 6-0 Prolene.  Prior to tying down the final proximal anastomosis, air was vented from the coronaries with a dose of retrograde warm blood cardioplegia.  The crossclamp was removed.  The heart resumed a spontaneous rhythm.  The vein grafts were de-aired and opened and each had good  flow.  Hemostasis was documented at the proximal distal anastomoses.  The cardioplegia cannulas were removed. The patient was rewarmed and reperfused.  Temporary pacing wires were applied.  The lungs were expanded and ventilator was resumed.  The patient was weaned from cardiopulmonary bypass without difficulty. Cardiac output was normal.  Echo showed good LV function.  Protamine was administered without adverse reaction.  The cannula was removed.  There was adequate hemostasis.  The superior pericardial fat was closed over the aorta and vein grafts.  The anterior mediastinal and left pleural chest tube were placed and brought out through separate incisions.  The sternum was closed with wire.  The pectoralis fascia was closed with a running #1 Vicryl.  The subcutaneous and skin layers were closed with a running Vicryl and sterile dressings were applied.  Total cardiopulmonary bypass time was 131 minutes.  Prior to prepping the patient, a pulmonary artery Swan-Ganz catheter was placed under sterile prep and drape, and after appropriate time-out in the right subclavian vein.  It was floated into the pulmonary artery, secured to the skin, and covered with sterile dressing.     Ivin Poot, M.D.     PV/MEDQ  D:  12/29/2014  T:  12/30/2014  Job:  [id number]  cc:   Quay Burow, M.D.

## 2014-12-30 NOTE — Progress Notes (Signed)
Utilization Review Completed.  

## 2014-12-30 NOTE — Anesthesia Postprocedure Evaluation (Signed)
  Anesthesia Post-op Note  Patient: Devin [name]  Procedure(s) Performed: Procedure(s) (LRB): CORONARY ARTERY BYPASS GRAFTING (CABG), ON PUMP, TIMES FOUR, USING LEFT INTERNAL MAMMARY ARTERY, RIGHT GREATER SAPHENOUS VEIN HARVESTED ENDOSCOPICALLY (N/A) TRANSESOPHAGEAL ECHOCARDIOGRAM (TEE) (N/A)  Patient Location: ICU  Anesthesia Type: General  Level of Consciousness: sedated  Airway and Oxygen Therapy: Patient intubated  Post-op Pain: mild  Post-op Assessment: Post-op Vital signs reviewed, Patient's Cardiovascular Status Stable, Respiratory Function Stable, Patent Airway and No signs of Nausea or vomiting  Last Vitals:  Filed Vitals:   12/30/14 1000  BP: 123/58  Pulse: 93  Temp:   Resp: 9    Post-op Vital Signs: stable   Complications: No apparent anesthesia complications

## 2014-12-31 ENCOUNTER — Inpatient Hospital Stay (HOSPITAL_COMMUNITY): Payer: Medicare Other

## 2014-12-31 LAB — BASIC METABOLIC PANEL
Anion gap: 6 (ref 5–15)
Anion gap: 3 — ABNORMAL LOW (ref 5–15)
BUN: 21 mg/dL (ref 6–23)
BUN: 25 mg/dL — ABNORMAL HIGH (ref 6–23)
CO2: 27 mmol/L (ref 19–32)
CO2: 30 mmol/L (ref 19–32)
Calcium: 7.5 mg/dL — ABNORMAL LOW (ref 8.4–10.5)
Calcium: 7.6 mg/dL — ABNORMAL LOW (ref 8.4–10.5)
Chloride: 100 mmol/L (ref 96–112)
Chloride: 99 mmol/L (ref 96–112)
Creatinine, Ser: 1.64 mg/dL — ABNORMAL HIGH (ref 0.50–1.35)
Creatinine, Ser: 1.46 mg/dL — ABNORMAL HIGH (ref 0.50–1.35)
GFR calc Af Amer: 48 mL/min — ABNORMAL LOW (ref >90)
GFR calc Af Amer: 55 mL/min — ABNORMAL LOW (ref >90)
GFR calc non Af Amer: 41 mL/min — ABNORMAL LOW (ref >90)
GFR calc non Af Amer: 48 mL/min — ABNORMAL LOW (ref >90)
Glucose, Bld: 125 mg/dL — ABNORMAL HIGH (ref 70–99)
Glucose, Bld: 132 mg/dL — ABNORMAL HIGH (ref 70–99)
Potassium: 4.3 mmol/L (ref 3.5–5.1)
Potassium: 4.4 mmol/L (ref 3.5–5.1)
Sodium: 133 mmol/L — ABNORMAL LOW (ref 135–145)
Sodium: 132 mmol/L — ABNORMAL LOW (ref 135–145)

## 2014-12-31 LAB — GLUCOSE, CAPILLARY
GLUCOSE-CAPILLARY: 144 mg/dL — AB (ref 70–99)
Glucose-Capillary: 125 mg/dL — ABNORMAL HIGH (ref 70–99)
Glucose-Capillary: 119 mg/dL — ABNORMAL HIGH (ref 70–99)
Glucose-Capillary: 116 mg/dL — ABNORMAL HIGH (ref 70–99)

## 2014-12-31 LAB — CBC
HCT: 35.8 % — ABNORMAL LOW (ref 39.0–52.0)
Hemoglobin: 11.6 g/dL — ABNORMAL LOW (ref 13.0–17.0)
MCH: 31.7 pg (ref 26.0–34.0)
MCHC: 32.4 g/dL (ref 30.0–36.0)
MCV: 97.8 fL (ref 78.0–100.0)
Platelets: 119 10*3/uL — ABNORMAL LOW (ref 150–400)
RBC: 3.66 MIL/uL — ABNORMAL LOW (ref 4.22–5.81)
RDW: 14.2 % (ref 11.5–15.5)
WBC: 8.9 10*3/uL (ref 4.0–10.5)

## 2014-12-31 MED ORDER — MAGNESIUM HYDROXIDE 400 MG/5ML PO SUSP: 30.0000 mL | Freq: Every day | ORAL | Status: DC | PRN

## 2014-12-31 MED ORDER — SODIUM CHLORIDE 0.9 % IJ SOLN: 3.0000 mL | INTRAMUSCULAR | Status: DC | PRN

## 2014-12-31 MED ORDER — FUROSEMIDE 10 MG/ML IJ SOLN
40.0000 mg | Freq: Every day | INTRAMUSCULAR | Status: DC
  Administered 2014-12-31 – 2015-01-01 (×2): 40 mg via INTRAVENOUS
  Filled 2014-12-31 (×4): qty 4

## 2014-12-31 MED ORDER — MOVING RIGHT ALONG BOOK
Freq: Once | Status: DC
  Filled 2014-12-31: qty 1

## 2014-12-31 MED ORDER — DOPAMINE-DEXTROSE 3.2-5 MG/ML-% IV SOLN
2.5000 ug/kg/min | INTRAVENOUS | Status: DC
Start: 2014-12-31 — End: 2015-01-01

## 2014-12-31 MED ORDER — INSULIN ASPART 100 UNIT/ML ~~LOC~~ SOLN
0.0000 [IU] | Freq: Three times a day (TID) | SUBCUTANEOUS | Status: DC
  Administered 2015-01-01: 2 [IU] via SUBCUTANEOUS
  Administered 2015-01-01: 4 [IU] via SUBCUTANEOUS

## 2014-12-31 MED ORDER — SODIUM CHLORIDE 0.9 % IV SOLN: 250.0000 mL | INTRAVENOUS | Status: DC | PRN

## 2014-12-31 MED ORDER — INSULIN DETEMIR 100 UNIT/ML ~~LOC~~ SOLN
12.0000 [IU] | Freq: Every day | SUBCUTANEOUS | Status: DC
  Administered 2014-12-31: 12 [IU] via SUBCUTANEOUS
  Filled 2014-12-31 (×3): qty 0.12

## 2014-12-31 MED ORDER — SODIUM CHLORIDE 0.9 % IJ SOLN
3.0000 mL | Freq: Two times a day (BID) | INTRAMUSCULAR | Status: DC
Start: 2014-12-31 — End: 2015-01-02
  Administered 2014-12-31 – 2015-01-01 (×2): 3 mL via INTRAVENOUS

## 2014-12-31 MED ORDER — DOPAMINE-DEXTROSE 3.2-5 MG/ML-% IV SOLN: 2.5000 ug/kg/min | INTRAVENOUS | Status: DC

## 2014-12-31 NOTE — Progress Notes (Signed)
2 Days Post-Op Procedure(s) (LRB): CORONARY ARTERY BYPASS GRAFTING (CABG), ON PUMP, TIMES FOUR, USING LEFT INTERNAL MAMMARY ARTERY, RIGHT GREATER SAPHENOUS VEIN HARVESTED ENDOSCOPICALLY (N/A) TRANSESOPHAGEAL ECHOCARDIOGRAM (TEE) (N/A) Subjective: Off pressors walking in ICU CXR clear NSR BP 108/60 Creat 1.6- wean renal dopamine, leave foley Will DC L PT and transfer to stepdown- check creat this pm   Objective: Vital signs in last 24 hours: Temp:  [97.9 F (36.6 C)-98.4 F (36.9 C)] 98.4 F (36.9 C) (03/03 0727) Pulse Rate:  [87-108] 98 (03/03 0700) Cardiac Rhythm:  [-] Atrial paced (03/03 0400) Resp:  [0-26] 10 (03/03 0700) BP: (90-159)/(44-71) 95/47 mmHg (03/03 0700) SpO2:  [90 %-98 %] 92 % (03/03 0700) Arterial Line BP: (92-134)/(41-65) 134/49 mmHg (03/02 1530) Weight:  [290 lb 12.6 oz (131.9 kg)] 290 lb 12.6 oz (131.9 kg) (03/03 0500)  Hemodynamic parameters for last 24 hours:  stable  Intake/Output from previous day: 03/02 0701 - 03/03 0700 In: 866.6 [I.V.:366.6; IV Piggyback:500] Out: 1330 [Urine:970; Chest Tube:360] Intake/Output this shift:    Lungs clear 2+ edema Neuro intact   Lab Results:  Recent Labs  12/30/14 1845 12/31/14 0430  WBC 8.9 8.9  HGB 12.3* 11.6*  HCT 37.6* 35.8*  PLT 123* 119*   BMET:  Recent Labs  12/30/14 0440 12/30/14 1837 12/30/14 1845 12/31/14 0430  NA 133* 136  --  133*  K 4.0 4.3  --  4.3  CL 104 99  --  100  CO2 25  --   --  27  GLUCOSE 125* 145*  --  125*  BUN 13 17  --  21  CREATININE 1.20 1.20 1.27 1.64*  CALCIUM 7.8*  --   --  7.5*    PT/INR:  Recent Labs  12/29/14 1300  LABPROT 16.6*  INR 1.33   ABG    Component Value Date/Time   PHART 7.327* 12/30/2014 0437   HCO3 23.5 12/30/2014 0437   TCO2 23 12/30/2014 1837   ACIDBASEDEF 3.0* 12/30/2014 0437   O2SAT 83.8 12/30/2014 0442   CBG (last 3)   Recent Labs  12/30/14 2326 12/31/14 0348 12/31/14 0725  GLUCAP 115* 125* 119*     Assessment/Plan: S/P Procedure(s) (LRB): CORONARY ARTERY BYPASS GRAFTING (CABG), ON PUMP, TIMES FOUR, USING LEFT INTERNAL MAMMARY ARTERY, RIGHT GREATER SAPHENOUS VEIN HARVESTED ENDOSCOPICALLY (N/A) TRANSESOPHAGEAL ECHOCARDIOGRAM (TEE) (N/A) Diuresis Diabetes control Continue foley due to urinary output monitoring Plan for transfer to step-down: see transfer orders   LOS: 6 days    VAN TRIGT III,PETER 12/31/2014

## 2014-12-31 NOTE — Progress Notes (Signed)
Patient ID: Devin [name], male   DOB: 02-18-1946, 69 y.o.   MRN: [id number]  SICU Evening Rounds:  Stable day. Urine output good BMET    Component Value Date/Time   NA 132* 12/31/2014 1715   K 4.4 12/31/2014 1715   CL 99 12/31/2014 1715   CO2 30 12/31/2014 1715   GLUCOSE 132* 12/31/2014 1715   BUN 25* 12/31/2014 1715   CREATININE 1.46* 12/31/2014 1715   CALCIUM 7.6* 12/31/2014 1715   GFRNONAA 48* 12/31/2014 1715   GFRAA 55* 12/31/2014 1715    Still has foley in. Hx of BPH on [place]. Awaiting transfer to [place].

## 2014-12-31 NOTE — Plan of Care (Signed)
Problem: Phase II - Intermediate Post-Op Goal: Maintain Hemodynamic Stability Outcome: Progressing Continues on dopamine

## 2014-12-31 NOTE — Plan of Care (Signed)
Problem: Phase II - Intermediate Post-Op Goal: Maintain Hemodynamic Stability Outcome: Completed/Met Date Met:  12/31/14 Pacer off. Dopamine weaned off.

## 2015-01-01 ENCOUNTER — Inpatient Hospital Stay (HOSPITAL_COMMUNITY): Payer: Medicare Other

## 2015-01-01 LAB — GLUCOSE, CAPILLARY
Glucose-Capillary: 113 mg/dL — ABNORMAL HIGH (ref 70–99)
Glucose-Capillary: 139 mg/dL — ABNORMAL HIGH (ref 70–99)
Glucose-Capillary: 96 mg/dL (ref 70–99)
Glucose-Capillary: 96 mg/dL (ref 70–99)

## 2015-01-01 LAB — CBC
HCT: 33.7 % — ABNORMAL LOW (ref 39.0–52.0)
Hemoglobin: 11 g/dL — ABNORMAL LOW (ref 13.0–17.0)
MCH: 31.9 pg (ref 26.0–34.0)
MCHC: 32.6 g/dL (ref 30.0–36.0)
MCV: 97.7 fL (ref 78.0–100.0)
Platelets: 121 10*3/uL — ABNORMAL LOW (ref 150–400)
RBC: 3.45 MIL/uL — ABNORMAL LOW (ref 4.22–5.81)
RDW: 14.2 % (ref 11.5–15.5)
WBC: 6.7 10*3/uL (ref 4.0–10.5)

## 2015-01-01 LAB — BASIC METABOLIC PANEL
Anion gap: 5 (ref 5–15)
BUN: 25 mg/dL — ABNORMAL HIGH (ref 6–23)
CO2: 28 mmol/L (ref 19–32)
Calcium: 7.6 mg/dL — ABNORMAL LOW (ref 8.4–10.5)
Chloride: 101 mmol/L (ref 96–112)
Creatinine, Ser: 1.3 mg/dL (ref 0.50–1.35)
GFR calc Af Amer: 64 mL/min — ABNORMAL LOW (ref >90)
GFR calc non Af Amer: 55 mL/min — ABNORMAL LOW (ref >90)
Glucose, Bld: 121 mg/dL — ABNORMAL HIGH (ref 70–99)
Potassium: 4.5 mmol/L (ref 3.5–5.1)
Sodium: 134 mmol/L — ABNORMAL LOW (ref 135–145)

## 2015-01-01 MED ORDER — METOPROLOL TARTRATE 25 MG PO TABS
25.0000 mg | ORAL_TABLET | Freq: Two times a day (BID) | ORAL | Status: DC
  Administered 2015-01-01 – 2015-01-03 (×5): 25 mg via ORAL
  Filled 2015-01-01 (×6): qty 1

## 2015-01-01 MED ORDER — LOSARTAN POTASSIUM 50 MG PO TABS
50.0000 mg | ORAL_TABLET | Freq: Every day | ORAL | Status: DC
  Administered 2015-01-01 – 2015-01-03 (×3): 50 mg via ORAL
  Filled 2015-01-01 (×3): qty 1

## 2015-01-01 MED ORDER — METOLAZONE 5 MG PO TABS
5.0000 mg | ORAL_TABLET | Freq: Every day | ORAL | Status: AC
  Administered 2015-01-01 – 2015-01-02 (×2): 5 mg via ORAL
  Filled 2015-01-01 (×2): qty 1

## 2015-01-01 NOTE — Progress Notes (Signed)
3 Days Post-Op Procedure(s) (LRB): CORONARY ARTERY BYPASS GRAFTING (CABG), ON PUMP, TIMES FOUR, USING LEFT INTERNAL MAMMARY ARTERY, RIGHT GREATER SAPHENOUS VEIN HARVESTED ENDOSCOPICALLY (N/A) TRANSESOPHAGEAL ECHOCARDIOGRAM (TEE) (N/A) Subjective: BP better, renal fx better NSR CXR clear  Objective: Vital signs in last 24 hours: Temp:  [98.1 F (36.7 C)-98.8 F (37.1 C)] 98.8 F (37.1 C) (03/04 0729) Pulse Rate:  [91-114] 98 (03/04 0700) Cardiac Rhythm:  [-] Normal sinus rhythm (03/04 0400) Resp:  [0-27] 14 (03/04 0700) BP: (102-144)/(55-73) 112/60 mmHg (03/04 0700) SpO2:  [90 %-99 %] 92 % (03/04 0700) Weight:  [288 lb 9.3 oz (130.9 kg)] 288 lb 9.3 oz (130.9 kg) (03/04 0630)  Hemodynamic parameters for last 24 hours:  stable  Intake/Output from previous day: 03/03 0701 - 03/04 0700 In: 1114.5 [P.O.:780; I.V.:284.5; IV Piggyback:50] Out: 2055 [Urine:1985; Chest Tube:70] Intake/Output this shift:    OOB to chair Lungs clear 2+ edema  Lab Results:  Recent Labs  12/31/14 0430 01/01/15 0358  WBC 8.9 6.7  HGB 11.6* 11.0*  HCT 35.8* 33.7*  PLT 119* 121*   BMET:  Recent Labs  12/31/14 1715 01/01/15 0358  NA 132* 134*  K 4.4 4.5  CL 99 101  CO2 30 28  GLUCOSE 132* 121*  BUN 25* 25*  CREATININE 1.46* 1.30  CALCIUM 7.6* 7.6*    PT/INR:  Recent Labs  12/29/14 1300  LABPROT 16.6*  INR 1.33   ABG    Component Value Date/Time   PHART 7.327* 12/30/2014 0437   HCO3 23.5 12/30/2014 0437   TCO2 23 12/30/2014 1837   ACIDBASEDEF 3.0* 12/30/2014 0437   O2SAT 83.8 12/30/2014 0442   CBG (last 3)   Recent Labs  12/31/14 1129 12/31/14 1626 12/31/14 2141  GLUCAP 144* 113* 116*    Assessment/Plan: S/P Procedure(s) (LRB): CORONARY ARTERY BYPASS GRAFTING (CABG), ON PUMP, TIMES FOUR, USING LEFT INTERNAL MAMMARY ARTERY, RIGHT GREATER SAPHENOUS VEIN HARVESTED ENDOSCOPICALLY (N/A) TRANSESOPHAGEAL ECHOCARDIOGRAM (TEE) (N/A) Diuresis Plan for transfer to  step-down: see transfer orders Resume losartan but at half of home dose  LOS: 7 days    VAN TRIGT III,Stephanne Greeley 01/01/2015

## 2015-01-01 NOTE — Progress Notes (Signed)
Report called to 2W RN, Theadora Rama. Pt transferred to 2W-22 via ambulation. Pt family and belongings at bedside. Meds in chart. No current questions or complaints at this time. Will cont to monitor.  Schwenke, Navi Erber L

## 2015-01-01 NOTE — Progress Notes (Signed)
CARDIAC REHAB PHASE I   PRE:  Rate/Rhythm: 92 SR  BP:  Supine:   Sitting: 112/60  Standing:    SaO2: 94 2L  MODE:  Ambulation: 550 ft   POST:  Rate/Rhythm: 101 ST  BP:  Supine:   Sitting: 110/56  Standing:    SaO2: 94 RA 1425-1510 Assisted X 1 and sued walker to ambulate. Gait steady with walker. Pt able to walk 550 feet without c/o. He is DOE, but states that his breathing is better since having the surgery. RA sat after walk 94%, O2 left off.  [name] RN 01/01/2015 3:10 PM

## 2015-01-01 NOTE — Discharge Summary (Signed)
Physician Discharge Summary  Patient ID: Devin [name] MRN: [id number] DOB/AGE: June 02, 1946 69 y.o.  Admit date: 12/25/2014 Discharge date: 01/03/2015  Admission Diagnoses:  Patient Active Problem List   Diagnosis Date Noted  . Unstable angina 12/26/2014  . NSTEMI (non-ST elevated myocardial infarction) 12/25/2014   Discharge Diagnoses:   Patient Active Problem List   Diagnosis Date Noted  . 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 Unstable angina 12/26/2014  . NSTEMI (non-ST elevated myocardial infarction) 12/25/2014   Discharged Condition: good  History of Present Illness:   Devin [name] is a 69 yo obese white male who presented to the Emergency Department with complaints of chest pain.  Workup in the ED showed non-specific EKG changes, but troponin levels were elevated.  He was taken for cardiac catheterization which showed normal LV function and severe multivessel CAD.  He was treated with IV heparin and NTG which resolved his angina symptoms.  It was felt he would best be treated with Coronary Bypass Grafting procedure and TCTS was consulted.    Hospital Course:   During his hospitalization he remained chest pain free.  He was evaluated by Dr. Prescott Gum on 12/28/2014 who was in agreement that coronary bypass grafting would be indicated.  The risks and benefits of the procedure were explained to the patient and he was agreeable to proceed.  He was taken to the operating room on 12/29/2014.  He underwent CABG x 4 utilizing LIMA to LAD, SVG to OM, SVG to OM2, and SVG to PDA.  He also underwent Endoscopic saphenous vein harvest of his right leg.  He tolerated the procedure well and was taken the SICU in stable condition.  He was extubated the evening of surgery without difficulty.  During his stay in the SICU the patient was weaned off Levophed and Dopamine as tolerated.  His creatinine was mildly elevated but overall remained stable.  His chest tubes and arterial lines were removed without difficulty.  His  chest tubes and arterial lines were removed without difficulty.  He was ambulating in the SICU and maintaining NSR.  He was felt medically stable for transfer to the step down unit.  The patient continued to progress.  He was treated with Lasix for hypervolemia.  He was restarted on home Cozaar at a reduced dose for Hypertension.  He continued to maintain NSR and his pacing wires were removed without difficulty.  He is ambulating with minimal assistance.  He is tolerating a cardiac diet.  Should no issues arise he will be discharged home 01/03/15.          Significant Diagnostic Studies: angiography:   HEMODYNAMICS:   AO SYSTOLIC/AO DIASTOLIC: 789/38  LV SYSTOLIC/LV DIASTOLIC: 101/7  ANGIOGRAPHIC RESULTS:   1. Left main; normal  2. LAD; 75% segmental tapered proximal, 95% tandem mid 3. Left circumflex; 75% distal AV groove.  4. Right coronary artery; this was a dominant vessel and was the infarct-related artery. There was a 95% mid stenosis as well as a 90% ostial PDA stenosis 7. Left ventriculography; RAO left ventriculogram was performed using  25 mL of Visipaque dye at 12 mL/second. The overall LVEF estimated  60 % Without wall motion abnormalities  Treatments: surgery:   1. Coronary artery bypass grafting x4 (left internal mammary artery to  LAD, saphenous vein graft to OM1, saphenous vein graft to OM2,  saphenous vein graft to the posterior descending artery). 2. Endoscopic harvest right leg greater saphenous vein.  Disposition:   Home  Discharge  Medications:    The patient has been discharged on:   1.Beta Blocker:  Yes [ x  ]                              No   [   ]                              If No, reason:  2.Ace Inhibitor/ARB: Yes [  x ]                                     No  [    ]                                     If No, reason:  3.Statin:   Yes [ x  ]                  No  [   ]                  If No, reason:  4.Ecasa:  Yes  [ x  ]                   No   [   ]                  If No, reason:    Medication List    STOP taking these medications        amLODipine 5 MG tablet  Commonly known as:  NORVASC      TAKE these medications        albuterol 108 (90 BASE) MCG/ACT inhaler  Commonly known as:  PROVENTIL HFA;VENTOLIN HFA  Inhale 1-2 puffs into the lungs every 6 (six) hours as needed for wheezing or shortness of breath.     aspirin 325 MG EC tablet  Take 1 tablet (325 mg total) by mouth daily.     atorvastatin 80 MG tablet  Commonly known as:  LIPITOR  Take 1 tablet (80 mg total) by mouth daily at 6 PM.     clonazePAM 0.5 MG tablet  Commonly known as:  KLONOPIN  Take 0.5 mg by mouth at bedtime.     doxazosin 4 MG tablet  Commonly known as:  CARDURA  Take 4 mg by mouth every morning.     furosemide 40 MG tablet  Commonly known as:  LASIX  Take 1 tablet (40 mg total) by mouth daily. For 7 Days     losartan 50 MG tablet  Commonly known as:  COZAAR  Take 1 tablet (50 mg total) by mouth daily.     metoprolol tartrate 25 MG tablet  Commonly known as:  LOPRESSOR  Take 1 tablet (25 mg total) by mouth 2 (two) times daily.     naproxen sodium 220 MG tablet  Commonly known as:  ANAPROX  Take 220 mg by mouth daily as needed (FOR PAIN).     Testosterone Cypionate 200 MG/ML Kit  Inject 400 mg into the muscle every 14 (fourteen) days.     traMADol 50 MG tablet  Commonly known as:  ULTRAM  Take 1-2 tablets (50-100 mg total) by mouth every  4 (four) hours as needed for moderate pain.        Follow-up Information    Follow up with VAN Wilber Oliphant, MD On 02/03/2015.   Specialty:  Cardiothoracic Surgery   Why:  Appoitnment is at 9:45   Contact information:   [address] [place] [address] [phone]       Follow up with [facility] On 02/03/2015.   Why:  Please get CXR at 8:45   Contact information:   [facility]       Follow up with Jory Sims, NP On 01/15/2015.    Specialty:  Nurse Practitioner   Why:  Appointment is at 1:10   Contact information:   [place] [address] [phone]       Signed: Ellwood Handler 01/03/2015, 7:55 AM

## 2015-01-01 NOTE — Discharge Instructions (Signed)
Coronary Artery Bypass Grafting, Care After °Refer to this sheet in the next few weeks. These instructions provide you with information on caring for yourself after your procedure. Your health care provider may also give you more specific instructions. Your treatment has been planned according to current medical practices, but problems sometimes occur. Call your health care provider if you have any problems or questions after your procedure. °WHAT TO EXPECT AFTER THE PROCEDURE °Recovery from surgery will be different for everyone. Some people feel well after 3 or 4 weeks, while for others it takes longer. After your procedure, it is typical to have the following: °· Nausea and a lack of appetite.   °· Constipation. °· Weakness and fatigue.   °· Depression or irritability.   °· Pain or discomfort at your incision site. °HOME CARE INSTRUCTIONS °· Take medicines only as directed by your health care provider. Do not stop taking medicines or start any new medicines without first checking with your health care provider. °· Take your pulse as directed by your health care provider. °· Perform deep breathing as directed by your health care provider. If you were given a device called an incentive spirometer, use it to practice deep breathing several times a day. Support your chest with a pillow or your arms when you take deep breaths or cough. °· Keep incision areas clean, dry, and protected. Remove or change any bandages (dressings) only as directed by your health care provider. You may have skin adhesive strips over the incision areas. Do not take the strips off. They will fall off on their own. °· Check incision areas daily for any swelling, redness, or drainage. °· If incisions were made in your legs, do the following: °¨ Avoid crossing your legs.   °¨ Avoid sitting for long periods of time. Change positions every 30 minutes.   °¨ Elevate your legs when you are sitting. °· Wear compression stockings as directed by your  health care provider. These stockings help keep blood clots from forming in your legs. °· Take showers once your health care provider approves. Until then, only take sponge baths. Pat incisions dry. Do not rub incisions with a washcloth or towel. Do not take baths, swim, or use a hot tub until your health care provider approves. °· Eat foods that are high in fiber, such as raw fruits and vegetables, whole grains, beans, and nuts. Meats should be lean cut. Avoid canned, processed, and fried foods. °· Drink enough fluid to keep your urine clear or pale yellow. °· Weigh yourself every day. This helps identify if you are retaining fluid that may make your heart and lungs work harder. °· Rest and limit activity as directed by your health care provider. You may be instructed to: °¨ Stop any activity at once if you have chest pain, shortness of breath, irregular heartbeats, or dizziness. Get help right away if you have any of these symptoms. °¨ Move around frequently for short periods or take short walks as directed by your health care provider. Increase your activities gradually. You may need physical therapy or cardiac rehabilitation to help strengthen your muscles and build your endurance. °¨ Avoid lifting, pushing, or pulling anything heavier than 10 lb (4.5 kg) for at least 6 weeks after surgery. °· Do not drive until your health care provider approves.  °· Ask your health care provider when you may return to work. °· Ask your health care provider when you may resume sexual activity. °· Keep all follow-up visits as directed by your health care   provider. This is important. °SEEK MEDICAL CARE IF: °· You have swelling, redness, increasing pain, or drainage at the site of an incision. °· You have a fever. °· You have swelling in your ankles or legs. °· You have pain in your legs.   °· You gain 2 or more pounds (0.9 kg) a day. °· You are nauseous or vomit. °· You have diarrhea.  °SEEK IMMEDIATE MEDICAL CARE IF: °· You have  chest pain that goes to your jaw or arms. °· You have shortness of breath.   °· You have a fast or irregular heartbeat.   °· You notice a "clicking" in your breastbone (sternum) when you move.   °· You have numbness or weakness in your arms or legs. °· You feel dizzy or light-headed.   °MAKE SURE YOU: °· Understand these instructions. °· Will watch your condition. °· Will get help right away if you are not doing well or get worse. °Document Released: 05/05/2005 Document Revised: 03/02/2014 Document Reviewed: 03/25/2013 °ExitCare® Patient Information ©2015 [employer]. This information is not intended to replace advice given to you by your health care provider. Make sure you discuss any questions you have with your health care provider. ° °Endoscopic Saphenous Vein Harvesting °Care After °Refer to this sheet in the next few weeks. These instructions provide you with information on caring for yourself after your procedure. Your health care provider may also give you more specific instructions. Your treatment has been planned according to current medical practices, but problems sometimes occur. Call your health care provider if you have any problems or questions after your procedure. °HOME CARE INSTRUCTIONS °Medicine °· Take whatever pain medicine your surgeon prescribes. Follow the directions carefully. Do not take over-the-counter pain medicine unless your surgeon says it is okay. Some pain medicine can cause bleeding problems for several weeks after surgery. °· Follow your surgeon's instructions about driving. You will probably not be permitted to drive after heart surgery. °· Take any medicines your surgeon prescribes. Any medicines you took before your heart surgery should be checked with your health care provider before you start taking them again. °Wound care °· If your surgeon has prescribed an elastic bandage or stocking, ask how long you should wear it. °· Check the area around your surgical cuts  (incisions) whenever your bandages (dressings) are changed. Look for any redness or swelling. °· You will need to return to have the stitches (sutures) or staples taken out. Ask your surgeon when to do that. °· Ask your surgeon when you can shower or bathe. °Activity °· Try to keep your legs raised when you are sitting. °· Do any exercises your health care providers have given you. These may include deep breathing exercises, coughing, walking, or other exercises. °SEEK MEDICAL CARE IF: °· You have any questions about your medicines. °· You have more leg pain, especially if your pain medicine stops working. °· New or growing bruises develop on your leg. °· Your leg swells, feels tight, or becomes red. °· You have numbness in your leg. °SEEK IMMEDIATE MEDICAL CARE IF: °· Your pain gets much worse. °· Blood or fluid leaks from any of the incisions. °· Your incisions become warm, swollen, or red. °· You have chest pain. °· You have trouble breathing. °· You have a fever. °· You have more pain near your leg incision. °MAKE SURE YOU: °· Understand these instructions. °· Will watch your condition. °· Will get help right away if you are not doing well or get worse. °Document Released: 06/28/2011   Document Revised: 10/21/2013 Document Reviewed: 06/28/2011 °ExitCare® Patient Information ©2015 [employer]. This information is not intended to replace advice given to you by your health care provider. Make sure you discuss any questions you have with your health care provider. ° ° °

## 2015-01-02 ENCOUNTER — Other Ambulatory Visit: Payer: Self-pay

## 2015-01-02 LAB — BASIC METABOLIC PANEL
Anion gap: 9 (ref 5–15)
BUN: 29 mg/dL — ABNORMAL HIGH (ref 6–23)
CO2: 30 mmol/L (ref 19–32)
Calcium: 7.7 mg/dL — ABNORMAL LOW (ref 8.4–10.5)
Chloride: 96 mmol/L (ref 96–112)
Creatinine, Ser: 1.3 mg/dL (ref 0.50–1.35)
GFR calc Af Amer: 64 mL/min — ABNORMAL LOW (ref >90)
GFR calc non Af Amer: 55 mL/min — ABNORMAL LOW (ref >90)
Glucose, Bld: 107 mg/dL — ABNORMAL HIGH (ref 70–99)
Potassium: 3.7 mmol/L (ref 3.5–5.1)
Sodium: 135 mmol/L (ref 135–145)

## 2015-01-02 LAB — GLUCOSE, CAPILLARY
GLUCOSE-CAPILLARY: 114 mg/dL — AB (ref 70–99)
Glucose-Capillary: 108 mg/dL — ABNORMAL HIGH (ref 70–99)
Glucose-Capillary: 121 mg/dL — ABNORMAL HIGH (ref 70–99)

## 2015-01-02 LAB — CBC
HCT: 32.2 % — ABNORMAL LOW (ref 39.0–52.0)
Hemoglobin: 10.7 g/dL — ABNORMAL LOW (ref 13.0–17.0)
MCH: 32.3 pg (ref 26.0–34.0)
MCHC: 33.2 g/dL (ref 30.0–36.0)
MCV: 97.3 fL (ref 78.0–100.0)
Platelets: 132 10*3/uL — ABNORMAL LOW (ref 150–400)
RBC: 3.31 MIL/uL — ABNORMAL LOW (ref 4.22–5.81)
RDW: 14 % (ref 11.5–15.5)
WBC: 4.9 10*3/uL (ref 4.0–10.5)

## 2015-01-02 MED ORDER — ONDANSETRON HCL 4 MG PO TABS
4.0000 mg | ORAL_TABLET | Freq: Three times a day (TID) | ORAL | Status: DC | PRN
Start: 2015-01-02 — End: 2015-01-03
  Administered 2015-01-03: 4 mg via ORAL
  Filled 2015-01-02: qty 1

## 2015-01-02 MED ORDER — FUROSEMIDE 40 MG PO TABS
40.0000 mg | ORAL_TABLET | Freq: Every day | ORAL | Status: DC
  Administered 2015-01-02 – 2015-01-03 (×2): 40 mg via ORAL
  Filled 2015-01-02 (×2): qty 1

## 2015-01-02 MED ORDER — LACTULOSE 10 GM/15ML PO SOLN
20.0000 g | Freq: Every day | ORAL | Status: DC | PRN
  Administered 2015-01-02: 20 g via ORAL
  Filled 2015-01-02 (×2): qty 30

## 2015-01-02 NOTE — Progress Notes (Signed)
CARDIAC REHAB PHASE I   PRE:  Rate/Rhythm: Sinus 87  BP:    Sitting: 113/58     SaO2: 93% Room Air  MODE:  Ambulation: 550 ft   POST:  Rate/Rhythem: 92% Room Air  BP:    Sitting: 121/59    SaO2: 92% Room Air  1430-1330  Patient ambulated independently in the hallway without complaints or symptoms. Talked with the patient about using his incentive spirometer at home. Reviewed exercise instructions, temperature precautions and endpoints of exercise. Devin [name] is interested in Phase 2 cardiac rehab at [facility].. Heart healthy diet reviewed with the patient. Set up discharge OHS video for patient and family to view.    Whitaker, Christa See RN, BSN

## 2015-01-02 NOTE — Progress Notes (Addendum)
      [address]       [place] [address]             [phone]      4 Days Post-Op Procedure(s) (LRB): CORONARY ARTERY BYPASS GRAFTING (CABG), ON PUMP, TIMES FOUR, USING LEFT INTERNAL MAMMARY ARTERY, RIGHT GREATER SAPHENOUS VEIN HARVESTED ENDOSCOPICALLY (N/A) TRANSESOPHAGEAL ECHOCARDIOGRAM (TEE) (N/A)   Subjective:  [name] complains of nausea with use if IV saline to flush line.  He states its immediate and request that any medication that can be given orally be changed if possible.  He is ambulating.  No BM  Objective: Vital signs in last 24 hours: Temp:  [98 F (36.7 C)-98.7 F (37.1 C)] 98 F (36.7 C) (03/05 0629) Pulse Rate:  [89-106] 106 (03/05 0629) Cardiac Rhythm:  [-] Normal sinus rhythm (03/04 2002) Resp:  [14-18] 18 (03/05 0629) BP: (100-136)/(55-68) 123/68 mmHg (03/05 0629) SpO2:  [93 %-96 %] 96 % (03/05 0629) Weight:  [283 lb 4.7 oz (128.5 kg)] 283 lb 4.7 oz (128.5 kg) (03/05 0629)  Intake/Output from previous day: 03/04 0701 - 03/05 0700 In: 400 [P.O.:360; I.V.:40] Out: 2675 [Urine:2675]  General appearance: alert, cooperative and no distress Heart: regular rate and rhythm Lungs: clear to auscultation bilaterally Abdomen: soft, non-tender; bowel sounds normal; no masses,  no organomegaly Extremities: edema trace Wound: clean and dry  Lab Results:  Recent Labs  01/01/15 0358 01/02/15 0335  WBC 6.7 4.9  HGB 11.0* 10.7*  HCT 33.7* 32.2*  PLT 121* 132*   BMET:  Recent Labs  01/01/15 0358 01/02/15 0335  NA 134* 135  K 4.5 3.7  CL 101 96  CO2 28 30  GLUCOSE 121* 107*  BUN 25* 29*  CREATININE 1.30 1.30  CALCIUM 7.6* 7.7*    PT/INR: No results for input(s): LABPROT, INR in the last 72 hours. ABG    Component Value Date/Time   PHART 7.327* 12/30/2014 0437   HCO3 23.5 12/30/2014 0437   TCO2 23 12/30/2014 1837   ACIDBASEDEF 3.0* 12/30/2014 0437   O2SAT 83.8 12/30/2014 0442   CBG (last 3)   Recent Labs   01/01/15 1613 01/01/15 2128 01/02/15 0604  GLUCAP 96 96 108*    Assessment/Plan: S/P Procedure(s) (LRB): CORONARY ARTERY BYPASS GRAFTING (CABG), ON PUMP, TIMES FOUR, USING LEFT INTERNAL MAMMARY ARTERY, RIGHT GREATER SAPHENOUS VEIN HARVESTED ENDOSCOPICALLY (N/A) TRANSESOPHAGEAL ECHOCARDIOGRAM (TEE) (N/A)  1. CV- Hemodynamically stable, tachycardic this morning-continue Lopressor, Cozaar 2. Pulm- no acute issues, continue IS 3. Renal- creatinine is stable, weight is elevated about 13 lbs since admission, minimal LE edema- on Lasix and Zaroxolyn 4. CBGs- controlled, patient is not a diabetic will d/c insulin 5. Dispo- patient stable, will d/c EPW, changed IV medications to oral, watch HR could be getting dry despite elevated weight there is minimal edema on exam  BARRETT, ERIN 01/02/2015  Patient examined Agree with above assessment and plan Discharge instructions reviewed with patient and wife including activity, wound care, medications, and diet Home on Lasix 40 mg daily for 1 week patient examined and medical record reviewed,agree with above note. VAN TRIGT III,Azreal Stthomas 01/02/2015

## 2015-01-02 NOTE — Progress Notes (Signed)
EPW removed. Berger

## 2015-01-03 MED ORDER — TRAMADOL HCL 50 MG PO TABS: 50.0000 mg | ORAL_TABLET | ORAL | Status: DC | PRN

## 2015-01-03 MED ORDER — LOSARTAN POTASSIUM 50 MG PO TABS: 50.0000 mg | ORAL_TABLET | Freq: Every day | ORAL | Status: DC

## 2015-01-03 MED ORDER — FUROSEMIDE 40 MG PO TABS: 40.0000 mg | ORAL_TABLET | Freq: Every day | ORAL | Status: DC

## 2015-01-03 MED ORDER — ATORVASTATIN CALCIUM 80 MG PO TABS: 80.0000 mg | ORAL_TABLET | Freq: Every day | ORAL | Status: DC

## 2015-01-03 MED ORDER — METOPROLOL TARTRATE 25 MG PO TABS: 25.0000 mg | ORAL_TABLET | Freq: Two times a day (BID) | ORAL | Status: DC

## 2015-01-03 MED ORDER — ASPIRIN 325 MG PO TBEC: 325.0000 mg | DELAYED_RELEASE_TABLET | Freq: Every day | ORAL | Status: DC

## 2015-01-03 NOTE — Progress Notes (Signed)
      [address]       [place] [address]             [phone]      5 Days Post-Op Procedure(s) (LRB): CORONARY ARTERY BYPASS GRAFTING (CABG), ON PUMP, TIMES FOUR, USING LEFT INTERNAL MAMMARY ARTERY, RIGHT GREATER SAPHENOUS VEIN HARVESTED ENDOSCOPICALLY (N/A) TRANSESOPHAGEAL ECHOCARDIOGRAM (TEE) (N/A)   Subjective:  Devin [name] complains of rash and itching along upper back this morning.  He has had no further nausea.  + ambulation  + BM  Objective: Vital signs in last 24 hours: Temp:  [98.4 F (36.9 C)-98.8 F (37.1 C)] 98.8 F (37.1 C) (03/06 0511) Pulse Rate:  [87-101] 101 (03/06 0511) Cardiac Rhythm:  [-] Normal sinus rhythm (03/05 2015) Resp:  [16-20] 16 (03/06 0511) BP: (102-133)/(50-72) 127/68 mmHg (03/06 0511) SpO2:  [93 %-94 %] 93 % (03/06 0511) Weight:  [277 lb 12.5 oz (126 kg)] 277 lb 12.5 oz (126 kg) (03/06 0511)  Intake/Output from previous day: 03/05 0701 - 03/06 0700 In: 360 [P.O.:360] Out: 1125 [Urine:1125]  General appearance: alert, cooperative and no distress Heart: regular rate and rhythm Lungs: clear to auscultation bilaterally Abdomen: soft, non-tender; bowel sounds normal; no masses,  no organomegaly Extremities: edema 1+ Wound: clean and dry  Back:  Mild red rash along upper back  Lab Results:  Recent Labs  01/01/15 0358 01/02/15 0335  WBC 6.7 4.9  HGB 11.0* 10.7*  HCT 33.7* 32.2*  PLT 121* 132*   BMET:  Recent Labs  01/01/15 0358 01/02/15 0335  NA 134* 135  K 4.5 3.7  CL 101 96  CO2 28 30  GLUCOSE 121* 107*  BUN 25* 29*  CREATININE 1.30 1.30  CALCIUM 7.6* 7.7*    PT/INR: No results for input(s): LABPROT, INR in the last 72 hours. ABG    Component Value Date/Time   PHART 7.327* 12/30/2014 0437   HCO3 23.5 12/30/2014 0437   TCO2 23 12/30/2014 1837   ACIDBASEDEF 3.0* 12/30/2014 0437   O2SAT 83.8 12/30/2014 0442   CBG (last 3)   Recent Labs  01/02/15 0604 01/02/15 1144 01/02/15 1620  GLUCAP  108* 121* 114*    Assessment/Plan: S/P Procedure(s) (LRB): CORONARY ARTERY BYPASS GRAFTING (CABG), ON PUMP, TIMES FOUR, USING LEFT INTERNAL MAMMARY ARTERY, RIGHT GREATER SAPHENOUS VEIN HARVESTED ENDOSCOPICALLY (N/A) TRANSESOPHAGEAL ECHOCARDIOGRAM (TEE) (N/A)  1. CV- Hemodynamically stable, continue Lopressor, Cozaar 2. Pulm- no acute issues, continue IS 3. Renal- weight trending down, will continue Lasix 4. Contact Dermatitis- along upper back, likely due to sweating and the detergent used on sheets- Hydrocortisone cream, Benadryl prn 5. Dispo- patient stable will d/c home today   LOS: 9 days    Devin [name], Devin [name] 01/03/2015

## 2015-01-03 NOTE — Progress Notes (Signed)
Pt discharged home with wife Discharge instructions given & reviewed Education discussed  IV dc'd  Tele dc'd  Sutures from CT removed, steri strips applied Pt discharged via wheelchair with RN All patient belongs at side.   Kathleen Argue S 11:56 AM

## 2015-01-03 NOTE — Progress Notes (Signed)
Patient ambulated 150 ft unassisted on room air.  Patient tolerated well.  RN will continue to monitor.

## 2015-01-04 LAB — GLUCOSE, CAPILLARY: Glucose-Capillary: 162 mg/dL — ABNORMAL HIGH (ref 70–99)

## 2015-01-06 ENCOUNTER — Telehealth: Payer: Self-pay | Admitting: *Deleted

## 2015-01-06 MED ORDER — METOPROLOL TARTRATE 25 MG PO TABS: 50.0000 mg | ORAL_TABLET | Freq: Two times a day (BID) | ORAL | Status: DC

## 2015-01-06 NOTE — Telephone Encounter (Signed)
I talked with Mrs. Tanney concerning her husband, Devin [name], who had been discharged from the hospital on Sunday, s/p CABG.  They had only taken his blood pressure three times but she said the readings were high in the morning on two occasions...150/69 and 158/76 with a pulse ranging from 95-98.  His home dose of Losartan had been decreased from 100 mg to 50 mg on d/c.  Also he is waking up nauseated every morning, but it subsides later in the day. She said he had a red area below his EVH incision, but not as red today and a little sore to touch. He is still a little constipated.  I discussed the med issues with Dr. Prescott Gum and relayed his new orders to her.  Continue the Losartan at 50 mg.  Increase the Metoprolol to 50 mg twice a day.  Regarding his nausea, I suggested he take medication for his reflux.  She said he had Pepcid which he used to take.  I said to start that.  Also, take laxatives till he is regular again.  Notify us if the red area becomes larger, more red or he gets chills or fever.  She agreed with all these new instrucitons.

## 2015-01-08 ENCOUNTER — Emergency Department (HOSPITAL_COMMUNITY)
Admission: EM | Admit: 2015-01-08 | Discharge: 2015-01-08 | Disposition: A | Discharge status: Home / Self Care | Payer: Medicare Other | Attending: Emergency Medicine | Admitting: Emergency Medicine

## 2015-01-08 ENCOUNTER — Encounter (HOSPITAL_COMMUNITY): Payer: Self-pay | Admitting: *Deleted

## 2015-01-08 ENCOUNTER — Other Ambulatory Visit: Payer: Self-pay | Admitting: *Deleted

## 2015-01-08 ENCOUNTER — Telehealth: Payer: Self-pay | Admitting: *Deleted

## 2015-01-08 DIAGNOSIS — M1990 Unspecified osteoarthritis, unspecified site: Secondary | ICD-10-CM | POA: Diagnosis not present

## 2015-01-08 DIAGNOSIS — Z85820 Personal history of malignant melanoma of skin: Secondary | ICD-10-CM | POA: Insufficient documentation

## 2015-01-08 DIAGNOSIS — I10 Essential (primary) hypertension: Secondary | ICD-10-CM | POA: Diagnosis not present

## 2015-01-08 DIAGNOSIS — Z79899 Other long term (current) drug therapy: Secondary | ICD-10-CM | POA: Insufficient documentation

## 2015-01-08 DIAGNOSIS — L03115 Cellulitis of right lower limb: Secondary | ICD-10-CM | POA: Insufficient documentation

## 2015-01-08 DIAGNOSIS — Z7982 Long term (current) use of aspirin: Secondary | ICD-10-CM | POA: Insufficient documentation

## 2015-01-08 DIAGNOSIS — I2510 Atherosclerotic heart disease of native coronary artery without angina pectoris: Secondary | ICD-10-CM | POA: Insufficient documentation

## 2015-01-08 DIAGNOSIS — Z87448 Personal history of other diseases of urinary system: Secondary | ICD-10-CM | POA: Diagnosis not present

## 2015-01-08 DIAGNOSIS — R2241 Localized swelling, mass and lump, right lower limb: Secondary | ICD-10-CM | POA: Diagnosis not present

## 2015-01-08 DIAGNOSIS — Z4733 Aftercare following explantation of knee joint prosthesis: Secondary | ICD-10-CM | POA: Insufficient documentation

## 2015-01-08 DIAGNOSIS — Z9981 Dependence on supplemental oxygen: Secondary | ICD-10-CM | POA: Diagnosis not present

## 2015-01-08 DIAGNOSIS — Z791 Long term (current) use of non-steroidal anti-inflammatories (NSAID): Secondary | ICD-10-CM | POA: Insufficient documentation

## 2015-01-08 DIAGNOSIS — R110 Nausea: Secondary | ICD-10-CM

## 2015-01-08 DIAGNOSIS — Z8719 Personal history of other diseases of the digestive system: Secondary | ICD-10-CM | POA: Insufficient documentation

## 2015-01-08 DIAGNOSIS — M7989 Other specified soft tissue disorders: Secondary | ICD-10-CM

## 2015-01-08 LAB — URINALYSIS, ROUTINE W REFLEX MICROSCOPIC
BILIRUBIN URINE: NEGATIVE
Glucose, UA: NEGATIVE mg/dL
Hgb urine dipstick: NEGATIVE
Ketones, ur: NEGATIVE mg/dL
Leukocytes, UA: NEGATIVE
Nitrite: NEGATIVE
Protein, ur: NEGATIVE mg/dL
Specific Gravity, Urine: 1.025 (ref 1.005–1.030)
UROBILINOGEN UA: 0.2 mg/dL (ref 0.0–1.0)
pH: 5.5 (ref 5.0–8.0)

## 2015-01-08 LAB — CBC WITH DIFFERENTIAL/PLATELET
Basophils Absolute: 0 10*3/uL (ref 0.0–0.1)
Basophils Relative: 0 % (ref 0–1)
EOS ABS: 0.2 10*3/uL (ref 0.0–0.7)
Eosinophils Relative: 3 % (ref 0–5)
HCT: 35.6 % — ABNORMAL LOW (ref 39.0–52.0)
Hemoglobin: 11.6 g/dL — ABNORMAL LOW (ref 13.0–17.0)
LYMPHS ABS: 1 10*3/uL (ref 0.7–4.0)
LYMPHS PCT: 16 % (ref 12–46)
MCH: 31.9 pg (ref 26.0–34.0)
MCHC: 32.6 g/dL (ref 30.0–36.0)
MCV: 97.8 fL (ref 78.0–100.0)
MONO ABS: 0.6 10*3/uL (ref 0.1–1.0)
MONOS PCT: 10 % (ref 3–12)
Neutro Abs: 4.6 10*3/uL (ref 1.7–7.7)
Neutrophils Relative %: 71 % (ref 43–77)
PLATELETS: 282 10*3/uL (ref 150–400)
RBC: 3.64 MIL/uL — AB (ref 4.22–5.81)
RDW: 13.9 % (ref 11.5–15.5)
WBC: 6.4 10*3/uL (ref 4.0–10.5)

## 2015-01-08 LAB — BASIC METABOLIC PANEL
Anion gap: 7 (ref 5–15)
BUN: 19 mg/dL (ref 6–23)
CHLORIDE: 100 mmol/L (ref 96–112)
CO2: 31 mmol/L (ref 19–32)
CREATININE: 1.3 mg/dL (ref 0.50–1.35)
Calcium: 8.2 mg/dL — ABNORMAL LOW (ref 8.4–10.5)
GFR calc Af Amer: 64 mL/min — ABNORMAL LOW (ref >90)
GFR calc non Af Amer: 55 mL/min — ABNORMAL LOW (ref >90)
GLUCOSE: 106 mg/dL — AB (ref 70–99)
Potassium: 4.1 mmol/L (ref 3.5–5.1)
Sodium: 138 mmol/L (ref 135–145)

## 2015-01-08 MED ORDER — CEPHALEXIN 500 MG PO CAPS: 500.0000 mg | ORAL_CAPSULE | Freq: Three times a day (TID) | ORAL | Status: DC

## 2015-01-08 MED ORDER — ENOXAPARIN SODIUM 120 MG/0.8ML ~~LOC~~ SOLN
1.0000 mg/kg | Freq: Once | SUBCUTANEOUS | Status: AC
  Administered 2015-01-08: 120 mg via SUBCUTANEOUS
  Filled 2015-01-08: qty 0.8

## 2015-01-08 MED ORDER — ONDANSETRON HCL 4 MG/2ML IJ SOLN
4.0000 mg | Freq: Once | INTRAMUSCULAR | Status: AC
  Administered 2015-01-08: 4 mg via INTRAVENOUS
  Filled 2015-01-08: qty 2

## 2015-01-08 MED ORDER — MORPHINE SULFATE 4 MG/ML IJ SOLN
4.0000 mg | Freq: Once | INTRAMUSCULAR | Status: AC
  Administered 2015-01-08: 4 mg via INTRAVENOUS
  Filled 2015-01-08: qty 1

## 2015-01-08 MED ORDER — PROMETHAZINE HCL 12.5 MG PO TABS: 12.5000 mg | ORAL_TABLET | Freq: Four times a day (QID) | ORAL | Status: DC | PRN

## 2015-01-08 MED ORDER — SODIUM CHLORIDE 0.9 % IV BOLUS (SEPSIS)
1000.0000 mL | Freq: Once | INTRAVENOUS | Status: AC
  Administered 2015-01-08: 1000 mL via INTRAVENOUS

## 2015-01-08 NOTE — ED Notes (Signed)
Notified Dr Reather Converse that [place] would not come back in to do doppler study.

## 2015-01-08 NOTE — ED Provider Notes (Signed)
CSN: [id number]     Arrival date & time 01/08/15  1552 History   First MD Initiated Contact with Patient 01/08/15 1717     Chief Complaint  Patient presents with  . Post-op Problem     (Consider location/radiation/quality/duration/timing/severity/associated sxs/prior Treatment) HPI Comments: 69 year old male with recent admission to [facility] for nSTEMI followed by four-way bypass using right saphenous vein presents with worsening leg pain and swelling with redness to the right surgical area on the leg. Patient denies fevers or chills however has had decreased by mouth intake and nausea. Patient on baby aspirin a day. No blood clot history known, patient has mild chest discomfort around surgical site however no concern for infection and similar to postop. No shortness of breath or cough. Decreased urinary frequency, patient has prostate history. No current antibiotics. Symptoms worse with walking on the right leg.  The history is provided by the patient.    Past Medical History  Diagnosis Date  . Essential hypertension   . Prostatic hypertrophy   . Melanoma of back   . Asbestosis   . Sleep apnea     Intolerant of CPAP  . GERD (gastroesophageal reflux disease)   . Arthritis   . 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   Past Surgical History  Procedure Laterality Date  . Anterior cervical decomp/discectomy fusion    . Shoulder open rotator cuff repair Left   . Knee arthroscopy Right   . Knee cartilage surgery Left   . Melanoma excision      "back"  . Left heart catheterization with coronary angiogram N/A 12/28/2014    Procedure: LEFT HEART CATHETERIZATION WITH CORONARY ANGIOGRAM;  Surgeon: Lorretta Harp, MD;  Location: [facility] CATH LAB;  Service: Cardiovascular;  Laterality: N/A;  . Coronary artery bypass graft N/A 12/29/2014    Procedure: CORONARY ARTERY BYPASS GRAFTING (CABG), ON PUMP, TIMES  FOUR, USING LEFT INTERNAL MAMMARY ARTERY, RIGHT GREATER SAPHENOUS VEIN HARVESTED ENDOSCOPICALLY;  Surgeon: Ivin Poot, MD;  Location: [place];  Service: Open Heart Surgery;  Laterality: N/A;  . Tee without cardioversion N/A 12/29/2014    Procedure: TRANSESOPHAGEAL ECHOCARDIOGRAM (TEE);  Surgeon: Ivin Poot, MD;  Location: [place];  Service: Open Heart Surgery;  Laterality: N/A;   Family History  Problem Relation Age of Onset  . Stroke Mother   . Hypertension Mother   . Heart disease Sister     Died from complications of RHD  . Heart disease Brother     Heart failure related to EtOH and smoking  . Stomach cancer Sister    History  Substance Use Topics  . Smoking status: Never Smoker   . Smokeless tobacco: Never Used  . Alcohol Use: 0.0 oz/week    0 Standard drinks or equivalent per week     Comment: Occasional    Review of Systems  Constitutional: Positive for appetite change. Negative for fever and chills.  HENT: Negative for congestion.   Eyes: Negative for visual disturbance.  Respiratory: Negative for shortness of breath.   Cardiovascular: Positive for leg swelling.  Gastrointestinal: Positive for nausea. Negative for vomiting and abdominal pain.  Genitourinary: Negative for dysuria and flank pain.  Musculoskeletal: Negative for back pain, neck pain and neck stiffness.  Skin: Negative for rash.  Neurological: Negative for light-headedness and headaches.      Allergies  Lasix  Home  Medications   Prior to Admission medications   Medication Sig Start Date End Date Taking? Authorizing Provider  albuterol (PROVENTIL HFA;VENTOLIN HFA) 108 (90 BASE) MCG/ACT inhaler Inhale 1-2 puffs into the lungs every 6 (six) hours as needed for wheezing or shortness of breath.   Yes Historical Provider, MD  aspirin EC 325 MG EC tablet Take 1 tablet (325 mg total) by mouth daily. 01/03/15  Yes Erin R Barrett, PA-C  atorvastatin (LIPITOR) 80 MG tablet Take 1 tablet (80 mg total) by mouth  daily at 6 PM. Patient taking differently: Take 80 mg by mouth every morning.  01/03/15  Yes Erin R Barrett, PA-C  clonazePAM (KLONOPIN) 0.5 MG tablet Take 0.5 mg by mouth at bedtime. 11/12/14  Yes Historical Provider, MD  ibuprofen (ADVIL,MOTRIN) 200 MG tablet Take 400 mg by mouth every 6 (six) hours as needed for mild pain or moderate pain.   Yes Historical Provider, MD  losartan (COZAAR) 50 MG tablet Take 1 tablet (50 mg total) by mouth daily. 01/03/15  Yes Erin R Barrett, PA-C  metoprolol tartrate (LOPRESSOR) 25 MG tablet Take 2 tablets (50 mg total) by mouth 2 (two) times daily. Patient taking differently: Take 25-50 mg by mouth 2 (two) times daily. Takes 1 tablet each morning and 2 tablets each evening. 01/06/15  Yes Ivin Poot, MD  Testosterone Cypionate 200 MG/ML KIT Inject 400 mg into the muscle every 14 (fourteen) days.   Yes Historical Provider, MD  traMADol (ULTRAM) 50 MG tablet Take 1-2 tablets (50-100 mg total) by mouth every 4 (four) hours as needed for moderate pain. 01/03/15  Yes Erin R Barrett, PA-C  doxazosin (CARDURA) 4 MG tablet Take 1.5 tablets (6 mg total) by mouth daily. 01/18/15   Satira Sark, MD  hydroxypropyl methylcellulose / hypromellose (ISOPTO TEARS / GONIOVISC) 2.5 % ophthalmic solution Place 1 drop into both eyes as needed for dry eyes.    Historical Provider, MD  naproxen sodium (ANAPROX) 220 MG tablet Take 220 mg by mouth daily as needed (FOR PAIN).    Historical Provider, MD  promethazine (PHENERGAN) 12.5 MG tablet Take 1 tablet (12.5 mg total) by mouth every 6 (six) hours as needed for nausea or vomiting. 01/08/15   Ivin Poot, MD   BP 123/65 mmHg  Pulse 94  Temp(Src) 98.3 F (36.8 C) (Oral)  Resp 21  Ht 6' (1.829 m)  Wt 269 lb (122.018 kg)  BMI 36.48 kg/m2  SpO2 92% Physical Exam  Constitutional: He is oriented to person, place, and time. He appears well-developed and well-nourished.  HENT:  Head: Normocephalic and atraumatic.  Eyes: Conjunctivae  are normal. Right eye exhibits no discharge. Left eye exhibits no discharge.  Neck: Normal range of motion. Neck supple. No tracheal deviation present.  Cardiovascular: Normal rate and regular rhythm.   Pulmonary/Chest: Effort normal and breath sounds normal.  Abdominal: Soft. He exhibits no distension. There is no tenderness. There is no guarding.  Musculoskeletal: He exhibits edema and tenderness.  Patient has 2+ edema right lower extremity, tender medial aspect and calf, mild erythema anterior mid tibia, no streaking up the leg, mild tenderness to incision distal saphenous vein, ecchymosis medial right thigh without tenderness, 2+ distal pulses in the right lower extremity.  Neurological: He is alert and oriented to person, place, and time.  Skin: Skin is warm. No rash noted.  Psychiatric: He has a normal mood and affect.  Nursing note and vitals reviewed.   ED Course  Procedures (including  critical care time) Emergency Ultrasound Study:   Angiocath insertion Performed by: Mariea Clonts  Consent: Verbal consent obtained. Risks and benefits: risks, benefits and alternatives were discussed Immediately prior to procedure the correct patient, procedure, equipment, support staff and site/side marked as needed.  Indication: difficult IV access Preparation: Patient was prepped and draped in the usual sterile fashion. Vein Location: right ac vein was visualized during assessment for potential access sites and was found to be patent/ easily compressed with linear ultrasound.  The needle was visualized with real-time ultrasound and guided into the vein. Gauge: 20 g  Image saved and stored.  Normal blood return.  Patient tolerance: Patient tolerated the procedure well with no immediate complications.     Labs Review Labs Reviewed  BASIC METABOLIC PANEL - Abnormal; Notable for the following:    Glucose, Bld 106 (*)    Calcium 8.2 (*)    GFR calc non Af Amer 55 (*)    GFR calc Af  Amer 64 (*)    All other components within normal limits  CBC WITH DIFFERENTIAL/PLATELET - Abnormal; Notable for the following:    RBC 3.64 (*)    Hemoglobin 11.6 (*)    HCT 35.6 (*)    All other components within normal limits  URINALYSIS, ROUTINE W REFLEX MICROSCOPIC    Imaging Review No results found.   EKG Interpretation None      MDM   Final diagnoses:  Right leg swelling  Cellulitis of right leg   Concern for cellulitis versus blood clot in the right lower extremity. Afebrile and overall well-appearing, plan for IV fluids, nausea meds, pain meds as needed ultrasound of the leg and blood work.  Pt to fup tomorrow am for DVT study, lovenox given.  Results and differential diagnosis were discussed with the patient/parent/guardian. Close follow up outpatient was discussed, comfortable with the plan.   Medications  sodium chloride 0.9 % bolus 1,000 mL (0 mLs Intravenous Stopped 01/08/15 2116)  ondansetron (ZOFRAN) injection 4 mg (4 mg Intravenous Given 01/08/15 1902)  morphine 4 MG/ML injection 4 mg (4 mg Intravenous Given 01/08/15 1903)  enoxaparin (LOVENOX) injection 120 mg (120 mg Subcutaneous Given 01/08/15 2114)    Filed Vitals:   01/08/15 2015 01/08/15 2030 01/08/15 2045 01/08/15 2100  BP:  107/71  123/65  Pulse: 98 96 92 94  Temp:      TempSrc:      Resp:      Height:      Weight:      SpO2: 92% 91% 91% 92%    Final diagnoses:  Right leg swelling  Cellulitis of right leg      Elnora Morrison, MD 01/18/15 1301

## 2015-01-08 NOTE — Telephone Encounter (Signed)
Per a previous phone call, Mrs.Achenbach says that he has been nauseated since discharge and it has persisted since that conversation and the restarting of his Pepcid.  Also, the reddened area on the [facility] leg discussed before is more red and slightly swollen. I had called in Phenergan po for his nausea from an earlier conversation today we had  about his nausea. He had received it in the hospital with good results. I suggested he come to the office so we could check his leg.  It was 3 pm. Due to the hour I also suggested he could go to the nearest ED.  He chose that solution.

## 2015-01-08 NOTE — Discharge Instructions (Signed)
Come to [facility] hospital to have the ultrasound of your right lower extremity done tomorrow to look for blood clot.  If you were given medicines take as directed.  If you are on coumadin or contraceptives realize their levels and effectiveness is altered by many different medicines.  If you have any reaction (rash, tongues swelling, other) to the medicines stop taking and see a physician.   Please follow up as directed and return to the ER or see a physician for new or worsening symptoms.  Thank you. Filed Vitals:   01/08/15 1603 01/08/15 1845 01/08/15 1900  BP: 121/54  147/77  Pulse: 92 92 97  Temp: 98.3 F (36.8 C)    TempSrc: Oral    Resp: 21    Height: 6' (1.829 m)    Weight: 269 lb (122.018 kg)    SpO2: 95% 93% 93%

## 2015-01-08 NOTE — ED Notes (Signed)
Pt states pain and redness near incision site from having a quadruple bypass last week. Swelling noted also.

## 2015-01-09 ENCOUNTER — Inpatient Hospital Stay (HOSPITAL_COMMUNITY): Admit: 2015-01-09 | Payer: Medicare Other

## 2015-01-09 ENCOUNTER — Ambulatory Visit (HOSPITAL_COMMUNITY)
Admission: RE | Admit: 2015-01-09 | Discharge: 2015-01-09 | Disposition: A | Discharge status: Home / Self Care | Payer: Medicare Other | Source: Ambulatory Visit | Attending: Emergency Medicine | Admitting: Emergency Medicine

## 2015-01-09 DIAGNOSIS — Z951 Presence of aortocoronary bypass graft: Secondary | ICD-10-CM | POA: Diagnosis not present

## 2015-01-09 DIAGNOSIS — M79604 Pain in right leg: Secondary | ICD-10-CM | POA: Diagnosis not present

## 2015-01-09 DIAGNOSIS — M7989 Other specified soft tissue disorders: Secondary | ICD-10-CM | POA: Insufficient documentation

## 2015-01-09 NOTE — ED Provider Notes (Signed)
10:25 am  Patient returned this morning for venous US of the right LE.  Report indicated no evidence of DVT. Discussed results with the patient.  Pain to extremity improves when elevated.   Patient reports that he has close f/u and will get his antibiotic filled today.    Patrice Paradise, PA-C 01/09/15 1031  Nat Christen, MD 01/09/15 1531

## 2015-01-14 ENCOUNTER — Encounter (HOSPITAL_COMMUNITY): Payer: Self-pay

## 2015-01-14 ENCOUNTER — Encounter (HOSPITAL_COMMUNITY)
Admission: RE | Admit: 2015-01-14 | Discharge: 2015-01-14 | Disposition: A | Discharge status: Home / Self Care | Payer: Medicare Other | Source: Ambulatory Visit | Attending: Cardiovascular Disease | Admitting: Cardiovascular Disease

## 2015-01-14 VITALS — BP 98/52 | HR 81 | Ht 72.0 in | Wt 273.6 lb

## 2015-01-14 DIAGNOSIS — Z951 Presence of aortocoronary bypass graft: Secondary | ICD-10-CM

## 2015-01-14 NOTE — Progress Notes (Signed)
Pt has finished orientation and is scheduled to start CR on February 01, 2015 at 1100. Pt has been instructed to arrive to class 15 minutes early for scheduled class. Pt has been instructed to wear comfortable clothing and shoes with rubber soles. Pt has been told to take their medications 1 hour prior to coming to class.  If the patient is not going to attend class, he has been instructed to call.

## 2015-01-14 NOTE — Progress Notes (Signed)
Cardiac/Pulmonary Rehab Medication Review by a Pharmacist  Does the patient  feel that his/her medications are working for him/her?  yes  Has the patient been experiencing any side effects to the medications prescribed?  no  Does the patient measure his/her own blood pressure or blood glucose at home?  yes   Does the patient have any problems obtaining medications due to transportation or finances?   no  Understanding of regimen: excellent Understanding of indications: excellent Potential of compliance: excellent  Questions asked to Determine Patient Understanding of Medication Regimen:  1. What is the name of the medication?  2. What is the medication used for?  3. When should it be taken?  4. How much should be taken?  5. How will you take it?  6. What side effects should you report?  Understanding Defined as: Excellent: All questions above are correct Good: Questions 1-4 are correct Fair: Questions 1-2 are correct  Poor: 1 or none of the above questions are correct   Pharmacist comments: Good understanding of medications.  Patient does state that doxazosin 4mg  tablet is not working as well as desired on certain dates.   Pricilla Larsson 01/14/2015 10:18 AM

## 2015-01-14 NOTE — Progress Notes (Signed)
Patient referred to Cardiac Rehab by Dr. Bronson Ing due to s/p CABG x 4, Z 95.1.  Dr. Bronson Ing is his cardiologist and Dr. Willey Blade is his PCP.  During orientation advised patient on arrival and appointment times what to wear, what to do before, during and after exercise.  Reviewed attendance and class policy.  Talked about inclement weather and class consultation policy. Patient is scheduled to start cardiac Rehab on Monday, February 01, 2015 at 1100.  Patient was advised to come to class 15 minutes before class starts.  He was also given instructions on meeting with the dietician and attending the Family Structure classes. Pt is eager to get started.  Pt completed 6 min walk test.

## 2015-01-15 ENCOUNTER — Ambulatory Visit (HOSPITAL_COMMUNITY)
Admission: RE | Admit: 2015-01-15 | Discharge: 2015-01-15 | Disposition: A | Discharge status: Home / Self Care | Payer: Medicare Other | Source: Ambulatory Visit | Attending: Adult Health | Admitting: Adult Health

## 2015-01-15 ENCOUNTER — Ambulatory Visit (INDEPENDENT_AMBULATORY_CARE_PROVIDER_SITE_OTHER): Payer: Medicare Other | Admitting: Adult Health

## 2015-01-15 ENCOUNTER — Encounter: Payer: Self-pay | Admitting: Adult Health

## 2015-01-15 VITALS — BP 118/64 | HR 94 | Ht 72.0 in | Wt 272.0 lb

## 2015-01-15 DIAGNOSIS — Z9889 Other specified postprocedural states: Secondary | ICD-10-CM | POA: Diagnosis not present

## 2015-01-15 DIAGNOSIS — Z951 Presence of aortocoronary bypass graft: Secondary | ICD-10-CM | POA: Insufficient documentation

## 2015-01-15 DIAGNOSIS — J90 Pleural effusion, not elsewhere classified: Secondary | ICD-10-CM | POA: Insufficient documentation

## 2015-01-15 DIAGNOSIS — R079 Chest pain, unspecified: Secondary | ICD-10-CM | POA: Diagnosis present

## 2015-01-15 DIAGNOSIS — R0602 Shortness of breath: Secondary | ICD-10-CM | POA: Diagnosis not present

## 2015-01-15 DIAGNOSIS — I2510 Atherosclerotic heart disease of native coronary artery without angina pectoris: Secondary | ICD-10-CM

## 2015-01-15 NOTE — Progress Notes (Deleted)
Name: Devin [name]    DOB: Mar 25, 1946  Age: 69 y.o.  MR#: [id number]       PCP:  [name], MD      Insurance: Payor: MEDICARE / Plan: MEDICARE PART A AND B / Product Type: *No Product type* /   CC:    Chief Complaint  Patient presents with  . Coronary Artery Disease    CABG  . Hypertension    VS Filed Vitals:   01/15/15 1253  BP: 118/64  Pulse: 94  Height: 6' (1.829 m)  Weight: 272 lb (123.378 kg)  SpO2: 95%    Weights Current Weight  01/15/15 272 lb (123.378 kg)  01/08/15 269 lb (122.018 kg)  01/03/15 277 lb 12.5 oz (126 kg)    Blood Pressure  BP Readings from Last 3 Encounters:  01/15/15 118/64  01/08/15 123/65  01/03/15 130/70     Admit date:  (Not on file) Last encounter with RMR:  Visit date not found   Allergy Review of patient's allergies indicates no known allergies.  Current Outpatient Prescriptions  Medication Sig Dispense Refill  . albuterol (PROVENTIL HFA;VENTOLIN HFA) 108 (90 BASE) MCG/ACT inhaler Inhale 1-2 puffs into the lungs every 6 (six) hours as needed for wheezing or shortness of breath.    Marland Kitchen aspirin EC 325 MG EC tablet Take 1 tablet (325 mg total) by mouth daily. 30 tablet 0  . atorvastatin (LIPITOR) 80 MG tablet Take 1 tablet (80 mg total) by mouth daily at 6 PM. (Patient taking differently: Take 80 mg by mouth every morning. ) 30 tablet 3  . cephALEXin (KEFLEX) 500 MG capsule Take 1 capsule (500 mg total) by mouth 3 (three) times daily. (Patient taking differently: Take 500 mg by mouth 3 (three) times daily. 7 day course started 01/10/2015.) 21 capsule 0  . clonazePAM (KLONOPIN) 0.5 MG tablet Take 0.5 mg by mouth at bedtime.    Marland Kitchen doxazosin (CARDURA) 4 MG tablet Take 4 mg by mouth every morning.    . hydroxypropyl methylcellulose / hypromellose (ISOPTO TEARS / GONIOVISC) 2.5 % ophthalmic solution Place 1 drop into both eyes as needed for dry eyes.    Marland Kitchen ibuprofen (ADVIL,MOTRIN) 200 MG tablet Take 400 mg by mouth every 6 (six) hours as needed for  mild pain or moderate pain.    Marland Kitchen losartan (COZAAR) 50 MG tablet Take 1 tablet (50 mg total) by mouth daily. 30 tablet 3  . metoprolol tartrate (LOPRESSOR) 25 MG tablet Take 2 tablets (50 mg total) by mouth 2 (two) times daily. (Patient taking differently: Take 25-50 mg by mouth 2 (two) times daily. Takes 1 tablet each morning and 2 tablets each evening.) 120 tablet 3  . naproxen sodium (ANAPROX) 220 MG tablet Take 220 mg by mouth daily as needed (FOR PAIN).    Marland Kitchen promethazine (PHENERGAN) 12.5 MG tablet Take 1 tablet (12.5 mg total) by mouth every 6 (six) hours as needed for nausea or vomiting. 15 tablet 0  . Testosterone Cypionate 200 MG/ML KIT Inject 400 mg into the muscle every 14 (fourteen) days.    . traMADol (ULTRAM) 50 MG tablet Take 1-2 tablets (50-100 mg total) by mouth every 4 (four) hours as needed for moderate pain. 30 tablet 0   No current facility-administered medications for this visit.    Discontinued Meds:   There are no discontinued medications.  Patient Active Problem List   Diagnosis Date Noted  . 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 Unstable angina 12/26/2014  .  NSTEMI (non-ST elevated myocardial infarction) 12/25/2014    LABS    Component Value Date/Time   NA 138 01/08/2015 1840   NA 135 01/02/2015 0335   NA 134* 01/01/2015 0358   K 4.1 01/08/2015 1840   K 3.7 01/02/2015 0335   K 4.5 01/01/2015 0358   CL 100 01/08/2015 1840   CL 96 01/02/2015 0335   CL 101 01/01/2015 0358   CO2 31 01/08/2015 1840   CO2 30 01/02/2015 0335   CO2 28 01/01/2015 0358   GLUCOSE 106* 01/08/2015 1840   GLUCOSE 107* 01/02/2015 0335   GLUCOSE 121* 01/01/2015 0358   BUN 19 01/08/2015 1840   BUN 29* 01/02/2015 0335   BUN 25* 01/01/2015 0358   CREATININE 1.30 01/08/2015 1840   CREATININE 1.30 01/02/2015 0335   CREATININE 1.30 01/01/2015 0358   CALCIUM 8.2* 01/08/2015 1840   CALCIUM 7.7* 01/02/2015 0335   CALCIUM 7.6* 01/01/2015 0358   GFRNONAA 55* 01/08/2015 1840   GFRNONAA 55*  01/02/2015 0335   GFRNONAA 55* 01/01/2015 0358   GFRAA 64* 01/08/2015 1840   GFRAA 64* 01/02/2015 0335   GFRAA 64* 01/01/2015 0358   CMP     Component Value Date/Time   NA 138 01/08/2015 1840   K 4.1 01/08/2015 1840   CL 100 01/08/2015 1840   CO2 31 01/08/2015 1840   GLUCOSE 106* 01/08/2015 1840   BUN 19 01/08/2015 1840   CREATININE 1.30 01/08/2015 1840   CALCIUM 8.2* 01/08/2015 1840   PROT 6.8 12/28/2014 1929   ALBUMIN 3.6 12/28/2014 1929   AST 25 12/28/2014 1929   ALT 27 12/28/2014 1929   ALKPHOS 45 12/28/2014 1929   BILITOT 0.8 12/28/2014 1929   GFRNONAA 55* 01/08/2015 1840   GFRAA 64* 01/08/2015 1840       Component Value Date/Time   WBC 6.4 01/08/2015 1840   WBC 4.9 01/02/2015 0335   WBC 6.7 01/01/2015 0358   HGB 11.6* 01/08/2015 1840   HGB 10.7* 01/02/2015 0335   HGB 11.0* 01/01/2015 0358   HCT 35.6* 01/08/2015 1840   HCT 32.2* 01/02/2015 0335   HCT 33.7* 01/01/2015 0358   MCV 97.8 01/08/2015 1840   MCV 97.3 01/02/2015 0335   MCV 97.7 01/01/2015 0358    Lipid Panel     Component Value Date/Time   CHOL 171 12/26/2014 0245   TRIG 120 12/26/2014 0245   HDL 31* 12/26/2014 0245   CHOLHDL 5.5 12/26/2014 0245   VLDL 24 12/26/2014 0245   LDLCALC 116* 12/26/2014 0245    ABG    Component Value Date/Time   PHART 7.327* 12/30/2014 0437   PCO2ART 44.8 12/30/2014 0437   PO2ART 72.0* 12/30/2014 0437   HCO3 23.5 12/30/2014 0437   TCO2 23 12/30/2014 1837   ACIDBASEDEF 3.0* 12/30/2014 0437   O2SAT 83.8 12/30/2014 0442     Lab Results  Component Value Date   TSH 5.443* 12/26/2014   BNP (last 3 results) No results for input(s): BNP in the last 8760 hours.  ProBNP (last 3 results) No results for input(s): PROBNP in the last 8760 hours.  Cardiac Panel (last 3 results) No results for input(s): CKTOTAL, CKMB, TROPONINI, RELINDX in the last 72 hours.  Iron/TIBC/Ferritin/ %Sat No results found for: IRON, TIBC, FERRITIN, IRONPCTSAT   EKG Orders placed or  performed during the hospital encounter of 12/25/14  . EKG 12-Lead  . EKG 12-Lead  . EKG 12-Lead  . EKG 12-Lead  . EKG 12-Lead  . EKG 12-Lead  .  EKG 12-Lead  . EKG 12-Lead  . EKG 12-Lead  . EKG 12-Lead  . EKG 12-Lead  . EKG 12-Lead  . EKG 12-Lead  . EKG 12-Lead  . EKG 12-Lead  . EKG 12-Lead  . EKG 12-Lead  . EKG 12-Lead  . EKG 12-Lead  . EKG     Prior Assessment and Plan Problem List as of 01/15/2015      Cardiovascular and Mediastinum   NSTEMI (non-ST elevated myocardial infarction)   Unstable angina     Other   S/P CABG x 4       Imaging: Dg Chest 2 View  01/01/2015   CLINICAL DATA:  Post CABG.  EXAM: CHEST  2 VIEW  COMPARISON:  12/31/2014  FINDINGS: Again noted is a right subclavian central line. Tip of the catheter is near the junction of the right innominate vein and right subclavian vein. Negative for pneumothorax. Heart size is mildly enlarged. Left chest tube has been removed. Surgical plate in the lower cervical spine. Mild blunting at the lung bases. Large bridging osteophytes in the lower thoracic spine. Epicardial wires are present.  IMPRESSION: Removal of chest tube without pneumothorax.  Basilar densities suggest tiny effusions and/or atelectasis.   Electronically Signed   By: Markus Daft M.D.   On: 01/01/2015 07:50   Dg Chest 2 View  12/25/2014   CLINICAL DATA:  Chest pain cough and congestion since last night  EXAM: CHEST  2 VIEW  COMPARISON:  CT scan of the chest of November 18, 2010  FINDINGS: The lungs are adequately inflated. The interstitial markings are increased bilaterally. There is no alveolar infiltrate or pleural effusion. The heart and pulmonary vascularity are normal. The mediastinum is normal in width. The bony thorax exhibits no acute abnormality. There are degenerative changes of the left shoulder.  IMPRESSION: Increased pulmonary interstitial markings bilaterally. Given the patient's acute symptoms in this may reflect pneumonitis. Follow-up  radiographs following anticipated antibiotic therapy are recommended to assure clearing.   Electronically Signed   By: David  Martinique   On: 12/25/2014 16:40   US Venous Img Lower Unilateral Right  01/09/2015   CLINICAL DATA:  Postop CABG (12/29/2014) with right greater saphenous vein harvesting, now with right lower extremity pain and swelling. Evaluate for DVT.  EXAM: RIGHT LOWER EXTREMITY VENOUS DOPPLER ULTRASOUND  TECHNIQUE: Gray-scale sonography with graded compression, as well as color Doppler and duplex ultrasound were performed to evaluate the lower extremity deep venous systems from the level of the common femoral vein and including the common femoral, femoral, profunda femoral, popliteal and calf veins including the posterior tibial, peroneal and gastrocnemius veins when visible. The superficial great saphenous vein was also interrogated. Spectral Doppler was utilized to evaluate flow at rest and with distal augmentation maneuvers in the common femoral, femoral and popliteal veins.  COMPARISON:  None.  FINDINGS: Contralateral Common Femoral Vein: Respiratory phasicity is normal and symmetric with the symptomatic side. No evidence of thrombus. Normal compressibility.  Common Femoral Vein: No evidence of thrombus. Normal compressibility, respiratory phasicity and response to augmentation.  Saphenofemoral Junction: No evidence of thrombus. Normal compressibility and flow on color Doppler imaging.  Profunda Femoral Vein: No evidence of thrombus. Normal compressibility and flow on color Doppler imaging.  Femoral Vein: No evidence of thrombus. Normal compressibility, respiratory phasicity and response to augmentation.  Popliteal Vein: No evidence of thrombus. Normal compressibility, respiratory phasicity and response to augmentation.  Calf Veins: No evidence of thrombus. Normal compressibility and flow on color  Doppler imaging.  Superficial Great Saphenous Vein: Surgically absent.  Venous Reflux:  None.  Other  Findings: There is a minimal amount of serpiginous anechoic fluid within the medial thigh and calf at the location of the greater saphenous vein harvesting site with the collection at the medial side measuring approximately 3.2 x 0.9 cm (image 31) and the collection within the calf measuring approximately 1.7 x 0.9 (image 38).  IMPRESSION: 1. No evidence of DVT within the right lower extremity. 2. Minimal amount of serpiginous anechoic fluid within both the medial thigh and calf at the location of the greater saphenous vein harvesting sites - nonspecific with differential considerations including hematoma, seroma and abscess. Clinical correlation is advised.   Electronically Signed   By: Sandi Mariscal M.D.   On: 01/09/2015 09:47   Dg Chest Port 1 View  12/31/2014   CLINICAL DATA:  Coronary artery bypass grafting. Postoperative radiograph.  EXAM: PORTABLE CHEST - 1 VIEW  COMPARISON:  12/30/2014.  FINDINGS: Support apparatus: Mediastinal drain has been removed. LEFT thoracostomy tube remains present. Monitoring leads project over the chest.  Cardiomediastinal Silhouette: Mildly enlarged. Size accentuated by low volumes.  Lungs: Basilar atelectasis. No edema or consolidation. No pneumothorax.  Effusions:  Probable small LEFT pleural effusion.  Other:  None.  IMPRESSION: 1. Interval removal of mediastinal drain. LEFT thoracostomy tube remains present. 2. Low volumes with basilar atelectasis. 3. Expected appearance of the chest following CABG.   Electronically Signed   By: Dereck Ligas M.D.   On: 12/31/2014 07:27   Dg Chest Port 1 View  12/30/2014   CLINICAL DATA:  Status post CABG  EXAM: PORTABLE CHEST - 1 VIEW  COMPARISON:  Portable chest of December 29, 2014  FINDINGS: There has been interval extubation of the trachea and esophagus. The Swan-Ganz catheter has been removed. The right subclavian Cordis sheath tip projects over the medial aspect of the right subclavian vein. A left-sided mediastinal drain and a left  lower chest tube remain. The lungs are adequately inflated. There is no focal infiltrate, pleural effusion, or pneumothorax. The cardiopericardial silhouette remains enlarged. The pulmonary vascularity is less engorged today.  IMPRESSION: Interval improvement in the appearance of the pulmonary interstitium consistent with resolving interstitial edema. Stable cardiomegaly. There is no pneumothorax or pleural effusion. The remaining support tubes and lines are in appropriate position radiographically.   Electronically Signed   By: David  Martinique   On: 12/30/2014 07:40   Dg Chest Port 1 View  12/29/2014   CLINICAL DATA:  Hypoxia  EXAM: PORTABLE CHEST - 1 VIEW  COMPARISON:  December 25, 2013  FINDINGS: Endotracheal tube tip is 3.9 cm above the carina. There is a Swan-Ganz catheter with the tip extending into the periphery of the right interlobar pulmonary artery. There is a chest tube on the left. Nasogastric tube tip and side port are below the diaphragm. There is a mediastinal drain. No pneumothorax. There is mild atelectasis in the left base. Lungs elsewhere clear. Heart is mildly enlarged with pulmonary vascularity within normal limits. No adenopathy.  IMPRESSION: Tube and catheter positions as described without pneumothorax. Note that the Swan-Ganz catheter tip is rather peripheral in location and may wish to be withdrawn approximately 6 cm. No edema or consolidation. Mild atelectasis left base.  Critical Value/emergent results were called by telephone at the time of interpretation on 12/29/2014 at 3:28 pm to [facility] , who verbally acknowledged these results.   Electronically Signed   By: Lowella Grip  III M.D.   On: 12/29/2014 15:28

## 2015-01-15 NOTE — Progress Notes (Signed)
Cardiology Office Note   Date:  01/15/2015   ID:  Devin [name], DOB [birth year], MRN [id number]  PCP:  [name], MD  Cardiologist:   Jory Sims, NP   Chief Complaint  Patient presents with  . Coronary Artery Disease    CABG  . Hypertension      History of Present Illness: Devin [name] is a 69 y.o. male who presents for assessment and management of CAD, recent hospitalization December 25 2014 2 01/03/2015 in the setting of unstable angina and non-ST elevation MI.  The patient had a cardiac catheterization, which showed normal LV function, but severe, multivessel CAD. Patient was referred to cardiothoracic surgery and underwent CABG (LIMA to LAD, SVG to OM1, SVG to OM 2, SVG to posterior descending artery,).  During hospitalization, Cozaar, dose was reduced, he was discontinued off of amlodipine.  He was continued on aspirin, statin, Lasix, 40 mg daily, losartan, reduced to 50 mg daily, metoprolol 25 mg twice a day.  He is here for post hospitalization followup and has not yet been seen on post hospitalization.  Surgical followup, which is due in April.  He was recently seen in the emergency room on 01/08/2015, with complaints of worsening leg pain and swelling and redness to the recent vein harvesting site of his leg.  He was found to have 2+ edema in the right lower extremity and was tender in the medial aspect of the calf.  The patient was admitted with concern for saline.  This versus DVT.  Followup ultrasound determined.  No evidence of DVT.  He was started on antibiotics.  The patient states he has been having more swelling and DOE for the last 24 hours. He was gaining fluid wt a few days ago and took extra dose of doxazosin which helped him to urinate more. He had been on lasix 40 mg daily for 5 days and finished it. He continues on antibiotic therapy. He has soreness in his chest from the surgery. Has occasional palpitations.    Past Medical History  Diagnosis Date  .  Hypertension   . Enlarged prostate   . Melanoma of back   . Complication of anesthesia     "had a colonoscopy; whatever they gave me to knock me out didn't knock me out; I talked to them during it"  . Heart murmur     "valves are leaking"  . Asbestosis   . Sleep apnea     "can't wear masks" (12/26/2014)  . GERD (gastroesophageal reflux disease)   . Arthritis     "hands, knees" (12/26/2014)    Past Surgical History  Procedure Laterality Date  . Anterior cervical decomp/discectomy fusion    . Shoulder open rotator cuff repair Left   . Knee arthroscopy Right   . Knee cartilage surgery Left   . Melanoma excision      "back"  . Left heart catheterization with coronary angiogram N/A 12/28/2014    Procedure: LEFT HEART CATHETERIZATION WITH CORONARY ANGIOGRAM;  Surgeon: Lorretta Harp, MD;  Location: [facility] CATH LAB;  Service: Cardiovascular;  Laterality: N/A;  . Coronary artery bypass graft N/A 12/29/2014    Procedure: CORONARY ARTERY BYPASS GRAFTING (CABG), ON PUMP, TIMES FOUR, USING LEFT INTERNAL MAMMARY ARTERY, RIGHT GREATER SAPHENOUS VEIN HARVESTED ENDOSCOPICALLY;  Surgeon: Ivin Poot, MD;  Location: [facility];  Service: Open Heart Surgery;  Laterality: N/A;  . Tee without cardioversion N/A 12/29/2014    Procedure: TRANSESOPHAGEAL ECHOCARDIOGRAM (TEE);  Surgeon: Ivin Poot, MD;  Location: [facility] OR;  Service: Open Heart Surgery;  Laterality: N/A;     Current Outpatient Prescriptions  Medication Sig Dispense Refill  . albuterol (PROVENTIL HFA;VENTOLIN HFA) 108 (90 BASE) MCG/ACT inhaler Inhale 1-2 puffs into the lungs every 6 (six) hours as needed for wheezing or shortness of breath.    Marland Kitchen aspirin EC 325 MG EC tablet Take 1 tablet (325 mg total) by mouth daily. 30 tablet 0  . atorvastatin (LIPITOR) 80 MG tablet Take 1 tablet (80 mg total) by mouth daily at 6 PM. (Patient taking differently: Take 80 mg by mouth every morning. ) 30 tablet 3  . cephALEXin (KEFLEX) 500 MG capsule Take 1 capsule  (500 mg total) by mouth 3 (three) times daily. (Patient taking differently: Take 500 mg by mouth 3 (three) times daily. 7 day course started 01/10/2015.) 21 capsule 0  . clonazePAM (KLONOPIN) 0.5 MG tablet Take 0.5 mg by mouth at bedtime.    Marland Kitchen doxazosin (CARDURA) 4 MG tablet Take 4 mg by mouth every morning.    . hydroxypropyl methylcellulose / hypromellose (ISOPTO TEARS / GONIOVISC) 2.5 % ophthalmic solution Place 1 drop into both eyes as needed for dry eyes.    Marland Kitchen ibuprofen (ADVIL,MOTRIN) 200 MG tablet Take 400 mg by mouth every 6 (six) hours as needed for mild pain or moderate pain.    Marland Kitchen losartan (COZAAR) 50 MG tablet Take 1 tablet (50 mg total) by mouth daily. 30 tablet 3  . metoprolol tartrate (LOPRESSOR) 25 MG tablet Take 2 tablets (50 mg total) by mouth 2 (two) times daily. (Patient taking differently: Take 25-50 mg by mouth 2 (two) times daily. Takes 1 tablet each morning and 2 tablets each evening.) 120 tablet 3  . naproxen sodium (ANAPROX) 220 MG tablet Take 220 mg by mouth daily as needed (FOR PAIN).    Marland Kitchen promethazine (PHENERGAN) 12.5 MG tablet Take 1 tablet (12.5 mg total) by mouth every 6 (six) hours as needed for nausea or vomiting. 15 tablet 0  . Testosterone Cypionate 200 MG/ML KIT Inject 400 mg into the muscle every 14 (fourteen) days.    . traMADol (ULTRAM) 50 MG tablet Take 1-2 tablets (50-100 mg total) by mouth every 4 (four) hours as needed for moderate pain. 30 tablet 0   No current facility-administered medications for this visit.    Allergies:   Review of patient's allergies indicates no known allergies.    Social History:  The patient  reports that he has never smoked. He has never used smokeless tobacco. He reports that he drinks alcohol. He reports that he does not use illicit drugs.   Family History:  The patient's family history includes Heart disease in his brother and sister; Hypertension in his mother; Stomach cancer in his sister; Stroke in his mother.    ROS: .    All other systems are reviewed and negative.Unless otherwise mentioned in H&P above.   PHYSICAL EXAM: VS:  BP 118/64 mmHg  Pulse 94  Ht 6' (1.829 m)  Wt 272 lb (123.378 kg)  BMI 36.88 kg/m2  SpO2 95% , BMI Body mass index is 36.88 kg/(m^2). GEN: Well nourished, well developed, in no acute distress HEENT: normal Neck: no JVD, carotid bruits, or masses Cardiac: RRR, 1/6 systolic murmur distant heart sounds, mild crackles; no murmurs, rubs, or gallops. Respiratory:  Some crackles noted laterally. No wheezes GI: soft, nontender, nondistended, + BS MS: no deformity or atrophyMidsteranl wound is healing well. CT and PMK insertion sites have steri  strips.   Skin: warm and dry, redness on the lower right leg below the knee. Pt states this is improved. Neuro:  Strength and sensation are intact Psych: euthymic mood, full affect   Recent Labs: 12/26/2014: TSH 5.443* 12/28/2014: ALT 27 12/30/2014: Magnesium 2.5 01/08/2015: BUN 19; Creatinine 1.30; Hemoglobin 11.6*; Platelets 282; Potassium 4.1; Sodium 138    Lipid Panel    Component Value Date/Time   CHOL 171 12/26/2014 0245   TRIG 120 12/26/2014 0245   HDL 31* 12/26/2014 0245   CHOLHDL 5.5 12/26/2014 0245   VLDL 24 12/26/2014 0245   LDLCALC 116* 12/26/2014 0245      Wt Readings from Last 3 Encounters:  01/15/15 272 lb (123.378 kg)  01/08/15 269 lb (122.018 kg)  01/03/15 277 lb 12.5 oz (126 kg)      Other studies Reviewed: Additional studies/ records that were reviewed today include: Discharge labs.  Review of the above records demonstrates: No anemia or renal failure.    ASSESSMENT AND PLAN:  1. CAD: S/P 4 Vessel CABG: Complained of worsening DOE, and some LEE that got better taking extra dose of doxazosin. Still having bilateral LE 1+. Some DOE today. I am concerned about CHF post surgery, anemia and renal status.. I will have a CXR, BMET, CBC and Pro-BNP to evaluate need to restart lasix. He will see Dr. Domenic Polite on Monday  to be established with cardiologist in [place]  for further recommendations. Hopefully will have results back before we leave today to make recommendations.   2. Hypertension: He is low normal currently. EF is normal per echo. Will not change any medications at this time or add lasix until labs and CXR are available. Amlodipine was discontinued at [facility] post surgery.   Current medicines are reviewed at length with the patient today.    Labs/ tests ordered today include: BMET, CBC, Pro-BNP and CXR  Orders Placed This Encounter  Procedures  . DG Chest 2 View  . Pro b natriuretic peptide  . CBC with Differential  . Basic Metabolic Panel (BMET)     Disposition:   FU with Dr. Domenic Polite to reassess his symptoms and to be established with local cardiologist s/p CABG.  Signed, Jory Sims, NP  01/15/2015 1:48 PM    [facility] [address] [place], [place] [address] Phone: [phone]; Fax: [phone]

## 2015-01-15 NOTE — Patient Instructions (Signed)
Your physician recommends that you schedule a follow-up appointment on Mon. With Dr. Domenic Polite  Your physician recommends that you continue on your current medications as directed. Please refer to the Current Medication list given to you today.  Your physician recommends that you return for lab work in: Today  Thank you for choosing [facility]!

## 2015-01-16 LAB — BASIC METABOLIC PANEL
BUN: 22 mg/dL (ref 6–23)
CO2: 27 meq/L (ref 19–32)
Calcium: 8.7 mg/dL (ref 8.4–10.5)
Chloride: 103 mEq/L (ref 96–112)
Creat: 0.98 mg/dL (ref 0.50–1.35)
Glucose, Bld: 96 mg/dL (ref 70–99)
Potassium: 4.8 mEq/L (ref 3.5–5.3)
Sodium: 138 mEq/L (ref 135–145)

## 2015-01-16 LAB — CBC WITH DIFFERENTIAL/PLATELET
Basophils Absolute: 0 10*3/uL (ref 0.0–0.1)
Basophils Relative: 0 % (ref 0–1)
Eosinophils Absolute: 0.1 10*3/uL (ref 0.0–0.7)
Eosinophils Relative: 2 % (ref 0–5)
HCT: 38 % — ABNORMAL LOW (ref 39.0–52.0)
Hemoglobin: 12.1 g/dL — ABNORMAL LOW (ref 13.0–17.0)
Lymphocytes Relative: 18 % (ref 12–46)
Lymphs Abs: 0.9 10*3/uL (ref 0.7–4.0)
MCH: 30.4 pg (ref 26.0–34.0)
MCHC: 31.8 g/dL (ref 30.0–36.0)
MCV: 95.5 fL (ref 78.0–100.0)
MONO ABS: 0.4 10*3/uL (ref 0.1–1.0)
MPV: 8.9 fL (ref 8.6–12.4)
Monocytes Relative: 8 % (ref 3–12)
NEUTROS ABS: 3.6 10*3/uL (ref 1.7–7.7)
Neutrophils Relative %: 72 % (ref 43–77)
Platelets: 337 10*3/uL (ref 150–400)
RBC: 3.98 MIL/uL — ABNORMAL LOW (ref 4.22–5.81)
RDW: 13.9 % (ref 11.5–15.5)
WBC: 5 10*3/uL (ref 4.0–10.5)

## 2015-01-16 LAB — PRO B NATRIURETIC PEPTIDE: Pro B Natriuretic peptide (BNP): 525.9 pg/mL — ABNORMAL HIGH (ref <126)

## 2015-01-18 ENCOUNTER — Ambulatory Visit (INDEPENDENT_AMBULATORY_CARE_PROVIDER_SITE_OTHER): Payer: Medicare Other | Admitting: Cardiology

## 2015-01-18 ENCOUNTER — Encounter: Payer: Self-pay | Admitting: Cardiology

## 2015-01-18 VITALS — BP 114/74 | HR 88 | Ht 72.0 in | Wt 268.6 lb

## 2015-01-18 DIAGNOSIS — I10 Essential (primary) hypertension: Secondary | ICD-10-CM

## 2015-01-18 DIAGNOSIS — E782 Mixed hyperlipidemia: Secondary | ICD-10-CM

## 2015-01-18 DIAGNOSIS — N400 Enlarged prostate without lower urinary tract symptoms: Secondary | ICD-10-CM

## 2015-01-18 DIAGNOSIS — R600 Localized edema: Secondary | ICD-10-CM | POA: Insufficient documentation

## 2015-01-18 DIAGNOSIS — I2510 Atherosclerotic heart disease of native coronary artery without angina pectoris: Secondary | ICD-10-CM

## 2015-01-18 DIAGNOSIS — G4733 Obstructive sleep apnea (adult) (pediatric): Secondary | ICD-10-CM | POA: Insufficient documentation

## 2015-01-18 MED ORDER — DOXAZOSIN MESYLATE 4 MG PO TABS: 6.0000 mg | ORAL_TABLET | Freq: Every day | ORAL | Status: DC

## 2015-01-18 NOTE — Patient Instructions (Addendum)
Your physician recommends that you schedule a follow-up appointment in: 1 month   INCREASE Cardura to 6 mg daily-  ( 1 1/2 tablets)     Please follow up with [facility] Dr Jeffie Pollock

## 2015-01-18 NOTE — Progress Notes (Signed)
Cardiology Office Note  Date: 01/18/2015   ID: [name], DOB 22-Aug-1946, MRN [id number]  PCP: [name], MD  Primary Cardiologist: Rozann Lesches, MD   Chief Complaint  Patient presents with  . CAD status post CABG    History of Present Illness: Devin [name] is a 69 y.o. male seen in clinic this past Friday by Ms. Lawrence NP and placed on my schedule today, this is our first meeting. I reviewed extensive records including his recent hospital stay with NSTEMI and diagnosis of multivessel CAD status post CABG by Dr.Van Trigt. He was also seen in the ER recently with concerns about leg edema and erythema (R > L), ruled out for right lower extremity DVT by ultrasound, and was placed on Keflex by Dr. Lacinda Axon. Ms. Purcell Nails NP sent the patient for chest x-ray and lab work this past Friday. Results are outlined below. It was recommended that he go back on Lasix with potassium supplements.   He is here today with his wife. He looks comfortable, denies any progressing shortness of breath or chest pain. He has intermittent postsurgical thoracic discomfort, appetite has been somewhat limited. He denies any fevers or chills. He tells me that he did not start back on the Lasix, states that taking diuretics makes him "sick." He states that he gets nauseated when he takes diuretics and did not want to go back on them. On his own, he decided to take extra Cardura due to difficulty urinating which has been a chronic problem for him with BPH. With this intervention, he states that he has been passing more urine and that his leg edema and erythema have been improving. He completed the course of Keflex as well. He has seen Dr. Jeffie Pollock for urological consultation.  He has a visit scheduled to see Dr. Prescott Gum in the next few weeks, also Dr. Willey Blade in about a month. He will begin cardiac rehabilitation in early April.   Past Medical History  Diagnosis Date  . Essential hypertension   . Prostatic hypertrophy    . Melanoma of back   . Asbestosis   . Sleep apnea     Intolerant of CPAP  . GERD (gastroesophageal reflux disease)   . Arthritis   . 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    Past Surgical History  Procedure Laterality Date  . Anterior cervical decomp/discectomy fusion    . Shoulder open rotator cuff repair Left   . Knee arthroscopy Right   . Knee cartilage surgery Left   . Melanoma excision      "back"  . Left heart catheterization with coronary angiogram N/A 12/28/2014    Procedure: LEFT HEART CATHETERIZATION WITH CORONARY ANGIOGRAM;  Surgeon: Lorretta Harp, MD;  Location: [facility] LAB;  Service: Cardiovascular;  Laterality: N/A;  . Coronary artery bypass graft N/A 12/29/2014    Procedure: CORONARY ARTERY BYPASS GRAFTING (CABG), ON PUMP, TIMES FOUR, USING LEFT INTERNAL MAMMARY ARTERY, RIGHT GREATER SAPHENOUS VEIN HARVESTED ENDOSCOPICALLY;  Surgeon: Ivin Poot, MD;  Location: [facility];  Service: Open Heart Surgery;  Laterality: N/A;  . Tee without cardioversion N/A 12/29/2014    Procedure: TRANSESOPHAGEAL ECHOCARDIOGRAM (TEE);  Surgeon: Ivin Poot, MD;  Location: [place];  Service: Open Heart Surgery;  Laterality: N/A;    Current Outpatient Prescriptions  Medication Sig Dispense Refill  . albuterol (PROVENTIL HFA;VENTOLIN  HFA) 108 (90 BASE) MCG/ACT inhaler Inhale 1-2 puffs into the lungs every 6 (six) hours as needed for wheezing or shortness of breath.    Marland Kitchen aspirin EC 325 MG EC tablet Take 1 tablet (325 mg total) by mouth daily. 30 tablet 0  . atorvastatin (LIPITOR) 80 MG tablet Take 1 tablet (80 mg total) by mouth daily at 6 PM. (Patient taking differently: Take 80 mg by mouth every morning. ) 30 tablet 3  . clonazePAM (KLONOPIN) 0.5 MG tablet Take 0.5 mg by mouth at bedtime.    . hydroxypropyl methylcellulose / hypromellose (ISOPTO TEARS / GONIOVISC) 2.5 % ophthalmic solution Place 1 drop  into both eyes as needed for dry eyes.    Marland Kitchen ibuprofen (ADVIL,MOTRIN) 200 MG tablet Take 400 mg by mouth every 6 (six) hours as needed for mild pain or moderate pain.    Marland Kitchen losartan (COZAAR) 50 MG tablet Take 1 tablet (50 mg total) by mouth daily. 30 tablet 3  . metoprolol tartrate (LOPRESSOR) 25 MG tablet Take 2 tablets (50 mg total) by mouth 2 (two) times daily. (Patient taking differently: Take 25-50 mg by mouth 2 (two) times daily. Takes 1 tablet each morning and 2 tablets each evening.) 120 tablet 3  . naproxen sodium (ANAPROX) 220 MG tablet Take 220 mg by mouth daily as needed (FOR PAIN).    Marland Kitchen promethazine (PHENERGAN) 12.5 MG tablet Take 1 tablet (12.5 mg total) by mouth every 6 (six) hours as needed for nausea or vomiting. 15 tablet 0  . Testosterone Cypionate 200 MG/ML KIT Inject 400 mg into the muscle every 14 (fourteen) days.    . traMADol (ULTRAM) 50 MG tablet Take 1-2 tablets (50-100 mg total) by mouth every 4 (four) hours as needed for moderate pain. 30 tablet 0  . doxazosin (CARDURA) 4 MG tablet Take 1.5 tablets (6 mg total) by mouth daily. 45 tablet 6   No current facility-administered medications for this visit.    Allergies:  Lasix   Social History: The patient  reports that he has never smoked. He has never used smokeless tobacco. He reports that he drinks alcohol. He reports that he does not use illicit drugs.   ROS:  Please see the history of present illness. Otherwise, complete review of systems is positive for limited appetite.  All other systems are reviewed and negative.    Physical Exam: VS:  BP 114/74 mmHg  Pulse 88  Ht 6' (1.829 m)  Wt 268 lb 9.6 oz (121.836 kg)  BMI 36.42 kg/m2  SpO2 97%, BMI Body mass index is 36.42 kg/(m^2).  Wt Readings from Last 3 Encounters:  01/18/15 268 lb 9.6 oz (121.836 kg)  01/15/15 272 lb (123.378 kg)  01/08/15 269 lb (122.018 kg)     General: Obese male, appears comfortable at rest. HEENT: Conjunctiva and lids normal,  oropharynx clear with moist mucosa. Neck: Supple, no elevated JVP or carotid bruits, no thyromegaly. Lungs: Decreased breath sounds at the bases, nonlabored breathing at rest. Thorax: Well-healing sternal incision without erythema or drainage. Cardiac: Regular rate and rhythm, no S3 or significant systolic murmur, no pericardial rub. Abdomen: Soft, nontender, bowel sounds present, no guarding or rebound. Extremities: 2+ right leg edema below the knee with mild erythema anteriorly (patient reports that this is much better), distal pulses 1-2+. Skin: Warm and dry. Musculoskeletal: No kyphosis. Neuropsychiatric: Alert and oriented x3, affect grossly appropriate.   ECG: ECG is not ordered today.  Recent Labwork: 12/26/2014: TSH 5.443* 12/28/2014: ALT 27;  AST 25 12/30/2014: Magnesium 2.5 01/15/2015: BUN 22; Creatinine 0.98; Hemoglobin 12.1*; Platelets 337; Potassium 4.8; Pro B Natriuretic peptide (BNP) 525.90*; Sodium 138     Component Value Date/Time   CHOL 171 12/26/2014 0245   TRIG 120 12/26/2014 0245   HDL 31* 12/26/2014 0245   CHOLHDL 5.5 12/26/2014 0245   VLDL 24 12/26/2014 0245   LDLCALC 116* 12/26/2014 0245    Other Studies Reviewed Today:  1. Echocardiogram 12/28/2014: Study Conclusions  - Left ventricle: The cavity size was normal. Wall thickness was normal. Systolic function was normal. The estimated ejection fraction was in the range of 60% to 65%. Wall motion was normal; there were no regional wall motion abnormalities. Features are consistent with a pseudonormal left ventricular filling pattern, with concomitant abnormal relaxation and increased filling pressure (grade 2 diastolic dysfunction). - Aortic valve: There was trivial regurgitation. - Right ventricle: The cavity size was mildly dilated. Systolic function was mildly reduced. - Right atrium: The atrium was mildly dilated. - Pulmonary arteries: Systolic pressure was mildly increased. PA peak  pressure: 38 mm Hg (S).   2. Chest x-ray 01/15/2015: CHEST 2 VIEW   COMPARISON: PA and lateral chest x-ray of January 01, 2015  FINDINGS: The lungs are well-expanded. The left pleural effusion has increased slightly in size and is now small to moderate. The pulmonary interstitial markings are mildly increased but not significantly changed from the previous study. Fluid in the right minor fissure has resolved. The cardiac silhouette is top-normal in size. The pulmonary vascularity is not engorged. There are 6 intact sternal wires. The trachea is midline. There is stable mild loss of height of approximately T8.  IMPRESSION: 1. There is no evidence of pneumonia nor pneumothorax. The pulmonary interstitial markings remain increased but the pulmonary vascularity is more distinct today. 2. Slight interval increase in the size of the left pleural effusion.   ASSESSMENT AND PLAN:  1. Multivessel CAD status post recent NSTEMI and CABG as outlined. LVEF 60-65% by echocardiogram. Continue current medical therapy which includes aspirin, Lopressor, Cozaar, and Lipitor. Follow-up with Dr. Prescott Gum as scheduled, we will see him back in one month.  2. Leg edema, right worse than left. Status post vein harvest on the right. Improving erythema per patient, he completed a course of Keflex which was provided after ER visit with Dr. Lacinda Axon, negative DVT on that side by ultrasound. He does not want to take any diuretics, states that they make him "sick." He has had improving leg edema spontaneously with concurrent increase in Cardura, reporting improved urine stream with BPH.  3. Symptomatic BPH, we will refer him back to see Dr. Jeffie Pollock soon (next visit was to be June) in case other medication adjustments or interventions are needed. He reports problems with urine stream, worse recently after Foley catheter.  4. Follow-up lipid status in the next 6-8 weeks. He continues on Lipitor. LDL around ACS was  116.   Current medicines are reviewed at length with the patient today. Patient states that he has had chronic problems with nausea using diuretics, does not want to take Lasix at this time. I did discuss with him the pleural effusions on chest x-ray and his leg edema.   Disposition: FU with me in 1 month.   Signed, Satira Sark, MD, [facility] 01/18/2015 9:27 AM    [place] [facility] [place]. [address] [place], [place] [address] Phone: [phone]; Fax: [phone]

## 2015-01-19 DIAGNOSIS — R339 Retention of urine, unspecified: Secondary | ICD-10-CM | POA: Diagnosis not present

## 2015-01-19 DIAGNOSIS — R3912 Poor urinary stream: Secondary | ICD-10-CM | POA: Diagnosis not present

## 2015-01-19 DIAGNOSIS — N401 Enlarged prostate with lower urinary tract symptoms: Secondary | ICD-10-CM | POA: Diagnosis not present

## 2015-01-23 ENCOUNTER — Inpatient Hospital Stay (HOSPITAL_COMMUNITY)
Admission: EM | Admit: 2015-01-23 | Discharge: 2015-01-29 | DRG: 309 | Disposition: A | Discharge status: Home / Self Care | Payer: Medicare Other | Attending: Internal Medicine | Admitting: Internal Medicine

## 2015-01-23 ENCOUNTER — Encounter (HOSPITAL_COMMUNITY): Payer: Self-pay | Admitting: Emergency Medicine

## 2015-01-23 ENCOUNTER — Emergency Department (HOSPITAL_COMMUNITY): Payer: Medicare Other

## 2015-01-23 DIAGNOSIS — I2510 Atherosclerotic heart disease of native coronary artery without angina pectoris: Secondary | ICD-10-CM | POA: Diagnosis present

## 2015-01-23 DIAGNOSIS — Z791 Long term (current) use of non-steroidal anti-inflammatories (NSAID): Secondary | ICD-10-CM | POA: Diagnosis not present

## 2015-01-23 DIAGNOSIS — J9811 Atelectasis: Secondary | ICD-10-CM | POA: Diagnosis not present

## 2015-01-23 DIAGNOSIS — I4892 Unspecified atrial flutter: Secondary | ICD-10-CM | POA: Diagnosis present

## 2015-01-23 DIAGNOSIS — Z7982 Long term (current) use of aspirin: Secondary | ICD-10-CM

## 2015-01-23 DIAGNOSIS — M1990 Unspecified osteoarthritis, unspecified site: Secondary | ICD-10-CM | POA: Diagnosis present

## 2015-01-23 DIAGNOSIS — Z85820 Personal history of malignant melanoma of skin: Secondary | ICD-10-CM

## 2015-01-23 DIAGNOSIS — Z823 Family history of stroke: Secondary | ICD-10-CM

## 2015-01-23 DIAGNOSIS — I10 Essential (primary) hypertension: Secondary | ICD-10-CM | POA: Diagnosis present

## 2015-01-23 DIAGNOSIS — K219 Gastro-esophageal reflux disease without esophagitis: Secondary | ICD-10-CM | POA: Diagnosis present

## 2015-01-23 DIAGNOSIS — K5900 Constipation, unspecified: Secondary | ICD-10-CM | POA: Diagnosis present

## 2015-01-23 DIAGNOSIS — R002 Palpitations: Secondary | ICD-10-CM | POA: Diagnosis not present

## 2015-01-23 DIAGNOSIS — N400 Enlarged prostate without lower urinary tract symptoms: Secondary | ICD-10-CM | POA: Diagnosis present

## 2015-01-23 DIAGNOSIS — Z79899 Other long term (current) drug therapy: Secondary | ICD-10-CM | POA: Diagnosis not present

## 2015-01-23 DIAGNOSIS — Z9119 Patient's noncompliance with other medical treatment and regimen: Secondary | ICD-10-CM | POA: Diagnosis present

## 2015-01-23 DIAGNOSIS — Z8249 Family history of ischemic heart disease and other diseases of the circulatory system: Secondary | ICD-10-CM | POA: Diagnosis not present

## 2015-01-23 DIAGNOSIS — I5032 Chronic diastolic (congestive) heart failure: Secondary | ICD-10-CM | POA: Diagnosis not present

## 2015-01-23 DIAGNOSIS — R0602 Shortness of breath: Secondary | ICD-10-CM | POA: Diagnosis not present

## 2015-01-23 DIAGNOSIS — Z951 Presence of aortocoronary bypass graft: Secondary | ICD-10-CM | POA: Diagnosis not present

## 2015-01-23 DIAGNOSIS — I480 Paroxysmal atrial fibrillation: Secondary | ICD-10-CM | POA: Diagnosis not present

## 2015-01-23 DIAGNOSIS — Z800 Family history of malignant neoplasm of digestive organs: Secondary | ICD-10-CM

## 2015-01-23 DIAGNOSIS — I4891 Unspecified atrial fibrillation: Principal | ICD-10-CM | POA: Diagnosis present

## 2015-01-23 DIAGNOSIS — G4733 Obstructive sleep apnea (adult) (pediatric): Secondary | ICD-10-CM | POA: Diagnosis present

## 2015-01-23 DIAGNOSIS — J90 Pleural effusion, not elsewhere classified: Secondary | ICD-10-CM | POA: Diagnosis not present

## 2015-01-23 DIAGNOSIS — E785 Hyperlipidemia, unspecified: Secondary | ICD-10-CM | POA: Diagnosis not present

## 2015-01-23 DIAGNOSIS — I959 Hypotension, unspecified: Secondary | ICD-10-CM | POA: Diagnosis not present

## 2015-01-23 DIAGNOSIS — N401 Enlarged prostate with lower urinary tract symptoms: Secondary | ICD-10-CM | POA: Diagnosis present

## 2015-01-23 LAB — COMPREHENSIVE METABOLIC PANEL
ALT: 18 U/L (ref 0–53)
AST: 13 U/L (ref 0–37)
Albumin: 3.6 g/dL (ref 3.5–5.2)
Alkaline Phosphatase: 88 U/L (ref 39–117)
Anion gap: 6 (ref 5–15)
BUN: 20 mg/dL (ref 6–23)
CO2: 26 mmol/L (ref 19–32)
Calcium: 8.6 mg/dL (ref 8.4–10.5)
Chloride: 105 mmol/L (ref 96–112)
Creatinine, Ser: 1.18 mg/dL (ref 0.50–1.35)
GFR calc Af Amer: 71 mL/min — ABNORMAL LOW (ref >90)
GFR, EST NON AFRICAN AMERICAN: 62 mL/min — AB (ref >90)
Glucose, Bld: 167 mg/dL — ABNORMAL HIGH (ref 70–99)
Potassium: 4 mmol/L (ref 3.5–5.1)
SODIUM: 137 mmol/L (ref 135–145)
TOTAL PROTEIN: 7 g/dL (ref 6.0–8.3)
Total Bilirubin: 0.7 mg/dL (ref 0.3–1.2)

## 2015-01-23 LAB — CBC WITH DIFFERENTIAL/PLATELET
BASOS PCT: 0 % (ref 0–1)
Basophils Absolute: 0 10*3/uL (ref 0.0–0.1)
EOS PCT: 3 % (ref 0–5)
Eosinophils Absolute: 0.1 10*3/uL (ref 0.0–0.7)
HEMATOCRIT: 40.3 % (ref 39.0–52.0)
Hemoglobin: 13.1 g/dL (ref 13.0–17.0)
LYMPHS ABS: 0.9 10*3/uL (ref 0.7–4.0)
Lymphocytes Relative: 21 % (ref 12–46)
MCH: 31 pg (ref 26.0–34.0)
MCHC: 32.5 g/dL (ref 30.0–36.0)
MCV: 95.5 fL (ref 78.0–100.0)
Monocytes Absolute: 0.3 10*3/uL (ref 0.1–1.0)
Monocytes Relative: 7 % (ref 3–12)
NEUTROS ABS: 3 10*3/uL (ref 1.7–7.7)
NEUTROS PCT: 69 % (ref 43–77)
PLATELETS: 204 10*3/uL (ref 150–400)
RBC: 4.22 MIL/uL (ref 4.22–5.81)
RDW: 13.6 % (ref 11.5–15.5)
WBC: 4.3 10*3/uL (ref 4.0–10.5)

## 2015-01-23 LAB — MRSA PCR SCREENING: MRSA BY PCR: NEGATIVE

## 2015-01-23 LAB — TSH: TSH: 1.608 u[IU]/mL (ref 0.350–4.500)

## 2015-01-23 LAB — TROPONIN I
TROPONIN I: 0.03 ng/mL (ref <0.031)
TROPONIN I: 0.05 ng/mL — AB (ref <0.031)

## 2015-01-23 MED ORDER — DILTIAZEM HCL 25 MG/5ML IV SOLN
20.0000 mg | Freq: Once | INTRAVENOUS | Status: AC
  Administered 2015-01-23: 20 mg via INTRAVENOUS

## 2015-01-23 MED ORDER — APIXABAN 5 MG PO TABS
5.0000 mg | ORAL_TABLET | Freq: Two times a day (BID) | ORAL | Status: AC
  Administered 2015-01-23 – 2015-01-27 (×9): 5 mg via ORAL
  Filled 2015-01-23 (×9): qty 1

## 2015-01-23 MED ORDER — METOPROLOL TARTRATE 50 MG PO TABS
50.0000 mg | ORAL_TABLET | Freq: Two times a day (BID) | ORAL | Status: DC
  Administered 2015-01-23 – 2015-01-24 (×2): 50 mg via ORAL
  Filled 2015-01-23 (×2): qty 1

## 2015-01-23 MED ORDER — DILTIAZEM HCL 100 MG IV SOLR
5.0000 mg/h | INTRAVENOUS | Status: DC
  Administered 2015-01-23: 5 mg/h via INTRAVENOUS

## 2015-01-23 MED ORDER — ASPIRIN EC 325 MG PO TBEC
325.0000 mg | DELAYED_RELEASE_TABLET | Freq: Every day | ORAL | Status: DC
  Administered 2015-01-23 – 2015-01-25 (×3): 325 mg via ORAL
  Filled 2015-01-23 (×3): qty 1

## 2015-01-23 MED ORDER — LOSARTAN POTASSIUM 50 MG PO TABS
50.0000 mg | ORAL_TABLET | Freq: Every day | ORAL | Status: DC
  Administered 2015-01-23 – 2015-01-25 (×2): 50 mg via ORAL
  Filled 2015-01-23 (×3): qty 1

## 2015-01-23 MED ORDER — DOXAZOSIN MESYLATE 2 MG PO TABS
4.0000 mg | ORAL_TABLET | Freq: Two times a day (BID) | ORAL | Status: DC
  Administered 2015-01-23 – 2015-01-25 (×4): 4 mg via ORAL
  Filled 2015-01-23 (×5): qty 2

## 2015-01-23 MED ORDER — ACETAMINOPHEN 325 MG PO TABS: 650.0000 mg | ORAL_TABLET | ORAL | Status: DC | PRN

## 2015-01-23 MED ORDER — ATORVASTATIN CALCIUM 40 MG PO TABS
80.0000 mg | ORAL_TABLET | Freq: Every morning | ORAL | Status: DC
  Administered 2015-01-24 – 2015-01-25 (×2): 80 mg via ORAL
  Filled 2015-01-23 (×2): qty 2

## 2015-01-23 MED ORDER — TRAMADOL HCL 50 MG PO TABS: 50.0000 mg | ORAL_TABLET | ORAL | Status: DC | PRN

## 2015-01-23 MED ORDER — ONDANSETRON HCL 4 MG/2ML IJ SOLN
4.0000 mg | Freq: Four times a day (QID) | INTRAMUSCULAR | Status: DC | PRN
  Administered 2015-01-23 – 2015-01-27 (×2): 4 mg via INTRAVENOUS
  Filled 2015-01-23 (×2): qty 2

## 2015-01-23 MED ORDER — DILTIAZEM HCL 100 MG IV SOLR
5.0000 mg/h | Freq: Once | INTRAVENOUS | Status: AC
  Administered 2015-01-23: 5 mg/h via INTRAVENOUS

## 2015-01-23 MED ORDER — ONDANSETRON HCL 4 MG/2ML IJ SOLN: 4.0000 mg | Freq: Three times a day (TID) | INTRAMUSCULAR | Status: DC | PRN

## 2015-01-23 MED ORDER — CLONAZEPAM 0.5 MG PO TABS
0.5000 mg | ORAL_TABLET | Freq: Every day | ORAL | Status: DC
  Administered 2015-01-23 – 2015-01-28 (×6): 0.5 mg via ORAL
  Filled 2015-01-23 (×6): qty 1

## 2015-01-23 NOTE — H&P (Signed)
[facility] History and Physical  PERSEUS WESTALL YIR:[id number] DOB: 11-Oct-1946 DOA: 01/23/2015  Referring physician: Dr. Audie Pinto PCP: [name], MD   Chief Complaint: palpitations  HPI: Devin [name] is a 69 y.o. male with history of coronary artery disease who recently underwent CABG 4 approximately 3-4 weeks ago. Patient reports that his postoperative course has been fairly unremarkable. He's had some residual chest tenderness on incision site as well as some tenderness in his left chest which has been present since his surgery. He has been able to walk without any recurrence of chest pain or shortness of breath. He reports intermittent episodes of palpitations which usually resolve on their own. Overnight, he developed persistent palpitations with associated shortness of breath. He did not have any chest pain. In the morning, he tried to "walk off" his palpitations. His symptoms persisted he presented to the emergency room where he was noted to be in rapid atrial fibrillation with a heart rate of 140. Denies any fever, nausea, vomiting, diarrhea, dysuria. He does have benign prostatic hypertrophy and difficulty passing his urine. He has chronic right lower extremity edema since his surgery. Venous ultrasound has ruled out DVT in this leg. He admitted for further treatments for atrial fibrillation.   Review of Systems:  Pertinent positives as per HPI, otherwise negative   Past Medical History  Diagnosis Date  . Essential hypertension   . Prostatic hypertrophy   . Melanoma of back   . Asbestosis   . Sleep apnea     Intolerant of CPAP  . GERD (gastroesophageal reflux disease)   . Arthritis   . 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   Past Surgical History  Procedure Laterality Date  . Anterior cervical decomp/discectomy fusion    . Shoulder open rotator cuff repair Left   . Knee  arthroscopy Right   . Knee cartilage surgery Left   . Melanoma excision      "back"  . Left heart catheterization with coronary angiogram N/A 12/28/2014    Procedure: LEFT HEART CATHETERIZATION WITH CORONARY ANGIOGRAM;  Surgeon: Lorretta Harp, MD;  Location[facility] C[facility] LAB;  Service: Cardiovascular;  Laterality: N/A;  . Coronary artery bypass graft N/A 12/29/2014    Procedure: CORONARY ARTERY BYPASS GRAFTING (CABG), ON PUMP, TIMES FOUR, USING LEFT INTERNAL MAMMARY ARTERY, RIGHT GREATER SAPHENOUS VEIN HARVESTED ENDOSCOPICALLY;  Surgeon: Ivin Poot, MD;  Location: [place];  Service: Open Heart Surgery;  Laterality: N/A;  . Tee without cardioversion N/A 12/29/2014    Procedure: TRANSESOPHAGEAL ECHOCARDIOGRAM (TEE);  Surgeon: Ivin Poot, MD;  Location: [place];  Service: Open Heart Surgery;  Laterality: N/A;   Social History:  reports that he has never smoked. He has never used smokeless tobacco. He reports that he drinks alcohol. He reports that he does not use illicit drugs.  Allergies  Allergen Reactions  . Lasix [Furosemide] Nausea Only    Family History  Problem Relation Age of Onset  . Stroke Mother   . Hypertension Mother   . Heart disease Sister     Died from complications of RHD  . Heart disease Brother     Heart failure related to EtOH and smoking  . Stomach cancer Sister      Prior to Admission medications   Medication Sig Start Date End Date Taking? Authorizing Provider  albuterol (PROVENTIL HFA;VENTOLIN HFA) 108 (90  BASE) MCG/ACT inhaler Inhale 1-2 puffs into the lungs every 6 (six) hours as needed for wheezing or shortness of breath.   Yes Historical Provider, MD  aspirin EC 325 MG EC tablet Take 1 tablet (325 mg total) by mouth daily. 01/03/15  Yes Erin R Barrett, PA-C  atorvastatin (LIPITOR) 80 MG tablet Take 1 tablet (80 mg total) by mouth daily at 6 PM. Patient taking differently: Take 80 mg by mouth every morning.  01/03/15  Yes Erin R Barrett, PA-C  clonazePAM  (KLONOPIN) 0.5 MG tablet Take 0.5 mg by mouth at bedtime. 11/12/14  Yes Historical Provider, MD  doxazosin (CARDURA) 4 MG tablet Take 1.5 tablets (6 mg total) by mouth daily. Patient taking differently: Take 4 mg by mouth 2 (two) times daily.  01/18/15  Yes Satira Sark, MD  ibuprofen (ADVIL,MOTRIN) 200 MG tablet Take 400 mg by mouth every 6 (six) hours as needed for mild pain or moderate pain.   Yes Historical Provider, MD  losartan (COZAAR) 50 MG tablet Take 1 tablet (50 mg total) by mouth daily. 01/03/15  Yes Erin R Barrett, PA-C  metoprolol tartrate (LOPRESSOR) 25 MG tablet Take 2 tablets (50 mg total) by mouth 2 (two) times daily. Patient taking differently: Take 25-50 mg by mouth 2 (two) times daily. Takes 1 tablet each morning and 2 tablets each evening. 01/06/15  Yes Ivin Poot, MD  promethazine (PHENERGAN) 12.5 MG tablet Take 1 tablet (12.5 mg total) by mouth every 6 (six) hours as needed for nausea or vomiting. 01/08/15  Yes Ivin Poot, MD  Testosterone Cypionate 200 MG/ML KIT Inject 400 mg into the muscle every 14 (fourteen) days.   Yes Historical Provider, MD  traMADol (ULTRAM) 50 MG tablet Take 1-2 tablets (50-100 mg total) by mouth every 4 (four) hours as needed for moderate pain. 01/03/15  Yes Freddrick March, PA-C   Physical Exam: Filed Vitals:   01/23/15 1445 01/23/15 1500 01/23/15 1515 01/23/15 1530  BP: 129/70 140/64 120/62 128/99  Pulse: 87 74 64 64  Temp:      TempSrc:      Resp: _0 Height:      Weight:      SpO2: 94% 93% 95% 95%    Wt Readings from Last 3 Encounters:  01/23/15 117.028 kg (258 lb)  01/18/15 121.836 kg (268 lb 9.6 oz)  01/15/15 123.378 kg (272 lb)    General:  Appears calm and comfortable Eyes: PERRL, normal lids, irises & conjunctiva ENT: grossly normal hearing, lips & tongue Neck: no LAD, masses or thyromegaly Cardiovascular: irregular,  1+ LE edema. Respiratory: CTA bilaterally, no w/r/r. Normal respiratory effort. Abdomen:  soft, ntnd Skin: bruising noted over medial aspect of right thigh Musculoskeletal: grossly normal tone BUE/BLE Psychiatric: grossly normal mood and affect, speech fluent and appropriate Neurologic: grossly non-focal.          Labs on Admission:  Basic Metabolic Panel:  Recent Labs Lab 01/23/15 1012  NA 137  K 4.0  CL 105  CO2 26  GLUCOSE 167*  BUN 20  CREATININE 1.18  CALCIUM 8.6   Liver Function Tests:  Recent Labs Lab 01/23/15 1012  AST 13  ALT 18  ALKPHOS 88  BILITOT 0.7  PROT 7.0  ALBUMIN 3.6   No results for input(s): LIPASE, AMYLASE in the last 168 hours. No results for input(s): AMMONIA in the last 168 hours. CBC:  Recent Labs Lab 01/23/15 1012  WBC 4.3  NEUTROABS  3.0  HGB 13.1  HCT 40.3  MCV 95.5  PLT 204   Cardiac Enzymes:  Recent Labs Lab 01/23/15 1012  TROPONINI 0.03    BNP (last 3 results) No results for input(s): BNP in the last 8760 hours.  ProBNP (last 3 results)  Recent Labs  01/15/15 1454  PROBNP 525.90*    CBG: No results for input(s): GLUCAP in the last 168 hours.  Radiological Exams on Admission: Dg Chest 1 View  01/23/2015   CLINICAL DATA:  Cardiac arrhythmia. Hypotension. Coronary artery bypass grafting 3.5 weeks prior  EXAM: CHEST  1 VIEW  COMPARISON:  January 15, 2015  FINDINGS: There is a persistent small left effusion with left base atelectasis. Lungs elsewhere clear. Heart is mildly enlarged with pulmonary vascularity within normal limits. No adenopathy. Patient is status post coronary artery bypass grafting. No pneumothorax. No bone lesions.  IMPRESSION: Persistent small left effusion with mild left base atelectasis. Lungs otherwise clear. Heart mildly enlarged with pulmonary vascularity within normal limits.   Electronically Signed   By: Lowella Grip III M.D.   On: 01/23/2015 10:40    EKG: Independently reviewed. Atrial fibrillation  Assessment/Plan Active Problems:   CAD (coronary artery disease), native  coronary artery   S/P CABG x 4   Essential hypertension   Atrial fibrillation and flutter   Palpitations   Hyperlipidemia   BPH (benign prostatic hyperplasia)   Chronic diastolic CHF (congestive heart failure)   Atrial fibrillation with RVR   1. Atrial fibrillation with rapid ventricular response. Patient was started on a Cardizem infusion with improvement of his heart rate. Will increase his baseline Lopressor at this time in the hopes of controlling his heart rate. His CHADSVASC score is 4. He'll be started on anticoagulation with Eliquis. Will check thyroid studies, cardiac markers. Echocardiogram was recently done does not need to be repeated. 2. Coronary artery disease status post CABG. Continue aspirin. He does not have any chest pain cardiac enzymes thus far are negative. 3. Chronic diastolic congestive heart failure. Appears relatively compensated at this time. Patient does not wish to take any Lasix at this time. He is not having shortness of breath. Continue to monitor 4. BPH. Continue Cardura 5. Hyperlipidemia. Continue statin    Code Status: full code DVT Prophylaxis: Eliquis Family Communication: discussed with patient and wife at the bedside Disposition Plan: discharge home once improved  Time spent: 61mns  Fenix Ruppe [facility] Pager [phone]

## 2015-01-23 NOTE — ED Notes (Signed)
Pt had CABG on March 1, c/o irregular heartbeat with SOB since yesterday. Pt reports low BP.

## 2015-01-23 NOTE — ED Provider Notes (Signed)
CSN: [id number]     Arrival date & time 01/23/15  8937 History  This chart was scribed for Devin Noble, MD by Edison Simon, ED Scribe. This patient was seen in room APA05/APA05 and the patient's care was started at 9:47 AM.    Chief Complaint  Patient presents with  . Hypotension  . Irregular Heart Beat   The history is provided by the patient. No language interpreter was used.    HPI Comments: Devin [name] is a 69 y.o. male who presents to the Emergency Department complaining of fast and irregular heartbeat with onset at 0100 this morning, increasing progressively in speed. He states his heart has skipped beats since CABG on March 1 by Dr. Prescott Gum. He reports associated hypotension and SOB. He states he measured his heart rate in 342A and systolic blood pressure in 90s today. He notes he has been using testosterone shots every 2 weeks, but has not had one in over a month; he states he sometimes has palpitations when he misses these injections. He tried to get one yesterday but his doctor's office was closed. He denies chest pain but states it is still tender from the surgery. He denies previous MI. He denies history of atrial fibrillation and states he does not use a blood thinner.  Cardiologist: Dr. Domenic Polite  Past Medical History  Diagnosis Date  . Essential hypertension   . Prostatic hypertrophy   . Melanoma of back   . Asbestosis   . Sleep apnea     Intolerant of CPAP  . GERD (gastroesophageal reflux disease)   . Arthritis   . 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   Past Surgical History  Procedure Laterality Date  . Anterior cervical decomp/discectomy fusion    . Shoulder open rotator cuff repair Left   . Knee arthroscopy Right   . Knee cartilage surgery Left   . Melanoma excision      "back"  . Left heart catheterization with coronary angiogram N/A 12/28/2014    Procedure: LEFT HEART  CATHETERIZATION WITH CORONARY ANGIOGRAM;  Surgeon: Lorretta Harp, MD;  Location: [facility] CATH LAB;  Service: Cardiovascular;  Laterality: N/A;  . Coronary artery bypass graft N/A 12/29/2014    Procedure: CORONARY ARTERY BYPASS GRAFTING (CABG), ON PUMP, TIMES FOUR, USING LEFT INTERNAL MAMMARY ARTERY, RIGHT GREATER SAPHENOUS VEIN HARVESTED ENDOSCOPICALLY;  Surgeon: Ivin Poot, MD;  Location: [place];  Service: Open Heart Surgery;  Laterality: N/A;  . Tee without cardioversion N/A 12/29/2014    Procedure: TRANSESOPHAGEAL ECHOCARDIOGRAM (TEE);  Surgeon: Ivin Poot, MD;  Location: [place];  Service: Open Heart Surgery;  Laterality: N/A;   Family History  Problem Relation Age of Onset  . Stroke Mother   . Hypertension Mother   . Heart disease Sister     Died from complications of RHD  . Heart disease Brother     Heart failure related to EtOH and smoking  . Stomach cancer Sister    History  Substance Use Topics  . Smoking status: Never Smoker   . Smokeless tobacco: Never Used  . Alcohol Use: 0.0 oz/week    0 Standard drinks or equivalent per week     Comment: Occasional    Review of Systems  Respiratory: Positive for shortness of breath.   Cardiovascular: Positive for palpitations.  All other systems reviewed and are negative.  Allergies  Lasix  Home Medications   Prior to Admission medications   Medication Sig Start Date End Date Taking? Authorizing Provider  albuterol (PROVENTIL HFA;VENTOLIN HFA) 108 (90 BASE) MCG/ACT inhaler Inhale 1-2 puffs into the lungs every 6 (six) hours as needed for wheezing or shortness of breath.   Yes Historical Provider, MD  aspirin EC 325 MG EC tablet Take 1 tablet (325 mg total) by mouth daily. 01/03/15  Yes Erin R Barrett, PA-C  atorvastatin (LIPITOR) 80 MG tablet Take 1 tablet (80 mg total) by mouth daily at 6 PM. Patient taking differently: Take 80 mg by mouth every morning.  01/03/15  Yes Erin R Barrett, PA-C  clonazePAM (KLONOPIN) 0.5 MG  tablet Take 0.5 mg by mouth at bedtime. 11/12/14  Yes Historical Provider, MD  doxazosin (CARDURA) 4 MG tablet Take 1.5 tablets (6 mg total) by mouth daily. Patient taking differently: Take 4 mg by mouth 2 (two) times daily.  01/18/15  Yes Satira Sark, MD  ibuprofen (ADVIL,MOTRIN) 200 MG tablet Take 400 mg by mouth every 6 (six) hours as needed for mild pain or moderate pain.   Yes Historical Provider, MD  losartan (COZAAR) 50 MG tablet Take 1 tablet (50 mg total) by mouth daily. 01/03/15  Yes Erin R Barrett, PA-C  metoprolol tartrate (LOPRESSOR) 25 MG tablet Take 2 tablets (50 mg total) by mouth 2 (two) times daily. Patient taking differently: Take 25-50 mg by mouth 2 (two) times daily. Takes 1 tablet each morning and 2 tablets each evening. 01/06/15  Yes Ivin Poot, MD  promethazine (PHENERGAN) 12.5 MG tablet Take 1 tablet (12.5 mg total) by mouth every 6 (six) hours as needed for nausea or vomiting. 01/08/15  Yes Ivin Poot, MD  Testosterone Cypionate 200 MG/ML KIT Inject 400 mg into the muscle every 14 (fourteen) days.   Yes Historical Provider, MD  traMADol (ULTRAM) 50 MG tablet Take 1-2 tablets (50-100 mg total) by mouth every 4 (four) hours as needed for moderate pain. 01/03/15  Yes Erin R Barrett, PA-C   BP 116/70 mmHg  Pulse 83  Temp(Src) 97.8 F (36.6 C) (Oral)  Resp 16  Ht 6' (1.829 m)  Wt 258 lb (117.028 kg)  BMI 34.98 kg/m2  SpO2 96% Physical Exam Physical Exam  Nursing note and vitals reviewed. Constitutional: He is oriented to person, place, and time. He appears well-developed and well-nourished. No distress.  HENT:  Head: Normocephalic and atraumatic.  Eyes: Pupils are equal, round, and reactive to light.  Neck: Normal range of motion.  Cardiovascular: Rapid rate irregular rhythm .   Pulmonary/Chest: No reNormal rate and intact distal pulsesspiratory distress.  Abdominal: Normal appearance. He exhibits no distension.  Musculoskeletal: Normal range of motion.   Neurological: He is alert and oriented to person, place, and time. No cranial nerve deficit.  Skin: Skin is warm and dry. No rash noted.  Psychiatric: He has a normal mood and affect. His behavior is normal.   ED Course  Procedures (including critical care time) CRITICAL CARE Performed by: Leonard Schwartz L Total critical care time: 45 min Critical care time was exclusive of separately billable procedures and treating other patients. Critical care was necessary to treat or prevent imminent or life-threatening deterioration. Critical care was time spent personally by me on the following activities: development of treatment plan with patient and/or surrogate as well as nursing, discussions with consultants, evaluation of patient's response to treatment, examination of patient, obtaining history from patient or surrogate, ordering  and performing treatments and interventions, ordering and review of laboratory studies, ordering and review of radiographic studies, pulse oximetry and re-evaluation of patient's condition.   Medications  diltiazem (CARDIZEM) 100 mg in dextrose 5 % 100 mL (1 mg/mL) infusion (5 mg/hr Intravenous New Bag/Given 01/23/15 1001)  diltiazem (CARDIZEM) injection 20 mg (0 mg Intravenous Stopped 01/23/15 1110)     DIAGNOSTIC STUDIES: Oxygen Saturation is 97% on room air, normal by my interpretation.    COORDINATION OF CARE: 9:58 AM Discussed treatment plan with patient at beside, the patient agrees with the plan and has no further questions at this time.   Labs Review Labs Reviewed  COMPREHENSIVE METABOLIC PANEL - Abnormal; Notable for the following:    Glucose, Bld 167 (*)    GFR calc non Af Amer 62 (*)    GFR calc Af Amer 71 (*)    All other components within normal limits  CBC WITH DIFFERENTIAL/PLATELET  TROPONIN I    Imaging Review Dg Chest 1 View  01/23/2015   CLINICAL DATA:  Cardiac arrhythmia. Hypotension. Coronary artery bypass grafting 3.5 weeks prior   EXAM: CHEST  1 VIEW  COMPARISON:  January 15, 2015  FINDINGS: There is a persistent small left effusion with left base atelectasis. Lungs elsewhere clear. Heart is mildly enlarged with pulmonary vascularity within normal limits. No adenopathy. Patient is status post coronary artery bypass grafting. No pneumothorax. No bone lesions.  IMPRESSION: Persistent small left effusion with mild left base atelectasis. Lungs otherwise clear. Heart mildly enlarged with pulmonary vascularity within normal limits.   Electronically Signed   By: Lowella Grip III M.D.   On: 01/23/2015 10:40     Date: 01/23/2015  Rate: 143  Rhythm: Atrial fibrillation with rapid ventricular response  QRS Axis: Undetermined  Intervals: normal  ST/T Wave abnormalities: Nonspecific  Conduction Disutrbances: Bundle-branch block  Narrative Interpretation: Abnormal EKG      MDM   Final diagnoses:  Atrial fibrillation with RVR    I personally performed the services described in this documentation, which was scribed in my presence. The recorded information has been reviewed and considered.   Leonard Schwartz, MD 01/23/15 1130

## 2015-01-23 NOTE — ED Notes (Signed)
MD at bedside. 

## 2015-01-24 LAB — TROPONIN I
Troponin I: 0.04 ng/mL — ABNORMAL HIGH (ref <0.031)
Troponin I: 0.04 ng/mL — ABNORMAL HIGH (ref <0.031)

## 2015-01-24 LAB — BASIC METABOLIC PANEL
ANION GAP: 6 (ref 5–15)
BUN: 20 mg/dL (ref 6–23)
CALCIUM: 8.4 mg/dL (ref 8.4–10.5)
CO2: 25 mmol/L (ref 19–32)
CREATININE: 1.07 mg/dL (ref 0.50–1.35)
Chloride: 107 mmol/L (ref 96–112)
GFR, EST AFRICAN AMERICAN: 80 mL/min — AB (ref >90)
GFR, EST NON AFRICAN AMERICAN: 69 mL/min — AB (ref >90)
Glucose, Bld: 107 mg/dL — ABNORMAL HIGH (ref 70–99)
POTASSIUM: 4.2 mmol/L (ref 3.5–5.1)
SODIUM: 138 mmol/L (ref 135–145)

## 2015-01-24 LAB — CBC
HEMATOCRIT: 40.2 % (ref 39.0–52.0)
Hemoglobin: 12.7 g/dL — ABNORMAL LOW (ref 13.0–17.0)
MCH: 30.2 pg (ref 26.0–34.0)
MCHC: 31.6 g/dL (ref 30.0–36.0)
MCV: 95.5 fL (ref 78.0–100.0)
Platelets: 161 10*3/uL (ref 150–400)
RBC: 4.21 MIL/uL — AB (ref 4.22–5.81)
RDW: 13.7 % (ref 11.5–15.5)
WBC: 4.3 10*3/uL (ref 4.0–10.5)

## 2015-01-24 MED ORDER — AMIODARONE HCL IN DEXTROSE 360-4.14 MG/200ML-% IV SOLN: 30.0000 mg/h | INTRAVENOUS | Status: DC

## 2015-01-24 MED ORDER — AMIODARONE HCL IN DEXTROSE 360-4.14 MG/200ML-% IV SOLN
30.0000 mg/h | INTRAVENOUS | Status: DC
  Filled 2015-01-24: qty 200
  Filled 2015-01-24: qty 400

## 2015-01-24 MED ORDER — AMIODARONE HCL IN DEXTROSE 360-4.14 MG/200ML-% IV SOLN: 60.0000 mg/h | INTRAVENOUS | Status: DC

## 2015-01-24 MED ORDER — AMIODARONE HCL IN DEXTROSE 360-4.14 MG/200ML-% IV SOLN: 60.0000 mg/h | INTRAVENOUS | Status: AC

## 2015-01-24 MED ORDER — AMIODARONE LOAD VIA INFUSION
150.0000 mg | Freq: Once | INTRAVENOUS | Status: AC
  Administered 2015-01-24: 150 mg via INTRAVENOUS
  Filled 2015-01-24: qty 83.34

## 2015-01-24 MED ORDER — AMIODARONE LOAD VIA INFUSION
150.0000 mg | Freq: Once | INTRAVENOUS | Status: DC
  Filled 2015-01-24: qty 83.34

## 2015-01-24 MED ORDER — AMIODARONE HCL IN DEXTROSE 360-4.14 MG/200ML-% IV SOLN
60.0000 mg/h | INTRAVENOUS | Status: DC
  Administered 2015-01-24 (×2): 60 mg/h via INTRAVENOUS

## 2015-01-24 MED ORDER — DILTIAZEM HCL ER COATED BEADS 240 MG PO CP24
240.0000 mg | ORAL_CAPSULE | Freq: Every day | ORAL | Status: DC
  Administered 2015-01-24 – 2015-01-25 (×2): 240 mg via ORAL
  Filled 2015-01-24 (×2): qty 1

## 2015-01-24 MED ORDER — METOPROLOL TARTRATE 50 MG PO TABS
50.0000 mg | ORAL_TABLET | Freq: Two times a day (BID) | ORAL | Status: DC
  Administered 2015-01-24 – 2015-01-25 (×2): 50 mg via ORAL
  Filled 2015-01-24 (×2): qty 1

## 2015-01-24 MED ORDER — AMIODARONE HCL IN DEXTROSE 360-4.14 MG/200ML-% IV SOLN
30.0000 mg/h | INTRAVENOUS | Status: DC
  Administered 2015-01-25: 30 mg/h via INTRAVENOUS
  Filled 2015-01-24: qty 200

## 2015-01-24 NOTE — Progress Notes (Signed)
Patient 4 weeks post CABG uneventful with normal systolic function has been in atrial fibrillation flutter for 36-48 hours current rate 110 bpm on IV infusion of Cardizem and chronic Lopressor 50 twice a day BURNETTE SAUTTER ZOX:[id number] DOB: September 25, 1946 DOA: 01/23/2015 PCP: [name], MD             Physical Exam: Blood pressure 106/60, pulse 58, temperature 97.5 F (36.4 C), temperature source Axillary, resp. rate 13, height 6' (1.829 m), weight 260 lb 2.3 oz (118 kg), SpO2 95 %. neck no JVD no carotid bruits no thyromegaly lungs clear to A&P no rales wheeze rhonchi heart irregular regular no S3 no heave thrills rubs   Investigations:  Recent Results (from the past 240 hour(s))  MRSA PCR Screening     Status: None   Collection Time: 01/23/15 12:26 PM  Result Value Ref Range Status   MRSA by PCR NEGATIVE NEGATIVE Final    Comment:        The GeneXpert MRSA Assay (FDA approved for NASAL specimens only), is one component of a comprehensive MRSA colonization surveillance program. It is not intended to diagnose MRSA infection nor to guide or monitor treatment for MRSA infections.      Basic Metabolic Panel:  Recent Labs  01/23/15 1012 01/24/15 0425  NA 137 138  K 4.0 4.2  CL 105 107  CO2 26 25  GLUCOSE 167* 107*  BUN 20 20  CREATININE 1.18 1.07  CALCIUM 8.6 8.4   Liver Function Tests:  Recent Labs  01/23/15 1012  AST 13  ALT 18  ALKPHOS 88  BILITOT 0.7  PROT 7.0  ALBUMIN 3.6     CBC:  Recent Labs  01/23/15 1012 01/24/15 0425  WBC 4.3 4.3  NEUTROABS 3.0  --   HGB 13.1 12.7*  HCT 40.3 40.2  MCV 95.5 95.5  PLT 204 161    Dg Chest 1 View  01/23/2015   CLINICAL DATA:  Cardiac arrhythmia. Hypotension. Coronary artery bypass grafting 3.5 weeks prior  EXAM: CHEST  1 VIEW  COMPARISON:  January 15, 2015  FINDINGS: There is a persistent small left effusion with left base atelectasis. Lungs elsewhere clear. Heart is mildly enlarged with pulmonary  vascularity within normal limits. No adenopathy. Patient is status post coronary artery bypass grafting. No pneumothorax. No bone lesions.  IMPRESSION: Persistent small left effusion with mild left base atelectasis. Lungs otherwise clear. Heart mildly enlarged with pulmonary vascularity within normal limits.   Electronically Signed   By: Lowella Grip III M.D.   On: 01/23/2015 10:40      Medications:   Impression: Active Problems:   CAD (coronary artery disease), native coronary artery   S/P CABG x 4   Essential hypertension   Atrial fibrillation and flutter   Palpitations   Hyperlipidemia   BPH (benign prostatic hyperplasia)   Chronic diastolic CHF (congestive heart failure)   Atrial fibrillation with RVR     Plan: We'll obtain cardiology consultation. Load with IV amiodarone in hopes of conversion to sinus rhythm  Consultants: Cardiology requested   Procedures   Antibiotics:                   Code Status: Full  Family Communication:  Lengthy discussion with patient about a host of health issues reassured him these were anticipated dysrhythmic events post CABG  Disposition Plan amiodarone loading dose and infusion in hopes to convert continue Lopressor 50 twice a day Eliquis 5 by mouth twice a day  cardiology consultation requested  Time spent: 40 minutes   LOS: 1 day   Keary Waterson M   01/24/2015, 10:55 AM

## 2015-01-24 NOTE — Progress Notes (Addendum)
Pt's HR is controlled, but he remains in atrial flutter verified by EKG. MD is aware and asked that pt be started on amiodarone in an attempt to convert pt back to a sinus rhythm.

## 2015-01-24 NOTE — Progress Notes (Signed)
MEDICATION RELATED CONSULT NOTE - INITIAL   Pharmacy Consult for Amiodarone Drug Interactions Indication: amiodarone added to medication regimen  Allergies  Allergen Reactions  . Lasix [Furosemide] Nausea Only   Patient Measurements: Height: 6' (182.9 cm) Weight: 260 lb 2.3 oz (118 kg) IBW/kg (Calculated) : 77.6  Vital Signs: Temp: 98.7 F (37.1 C) (03/27 1647) Temp Source: Oral (03/27 1647) BP: 107/64 mmHg (03/27 1645) Pulse Rate: 68 (03/27 1645) Intake/Output from previous day: 03/26 0701 - 03/27 0700 In: 327.1 [P.O.:240; I.V.:87.1] Out: 1325 [Urine:1325] Intake/Output from this shift: Total I/O In: 15.4 [I.V.:15.4] Out: 375 [Urine:375]  Labs:  Recent Labs  01/23/15 1012 01/24/15 0425  WBC 4.3 4.3  HGB 13.1 12.7*  HCT 40.3 40.2  PLT 204 161  CREATININE 1.18 1.07  ALBUMIN 3.6  --   PROT 7.0  --   AST 13  --   ALT 18  --   ALKPHOS 88  --   BILITOT 0.7  --    Estimated Creatinine Clearance: 87.7 mL/min (by C-G formula based on Cr of 1.07).  Microbiology: Recent Results (from the past 720 hour(s))  MRSA PCR Screening     Status: None   Collection Time: 12/26/14 12:09 AM  Result Value Ref Range Status   MRSA by PCR NEGATIVE NEGATIVE Final    Comment:        The GeneXpert MRSA Assay (FDA approved for NASAL specimens only), is one component of a comprehensive MRSA colonization surveillance program. It is not intended to diagnose MRSA infection nor to guide or monitor treatment for MRSA infections.   Surgical pcr screen     Status: None   Collection Time: 12/28/14  6:17 PM  Result Value Ref Range Status   MRSA, PCR NEGATIVE NEGATIVE Final   Staphylococcus aureus NEGATIVE NEGATIVE Final    Comment:        The Xpert SA Assay (FDA approved for NASAL specimens in patients over 7 years of age), is one component of a comprehensive surveillance program.  Test performance has been validated by [facility] for patients greater than or equal to 65 year  old. It is not intended to diagnose infection nor to guide or monitor treatment.   MRSA PCR Screening     Status: None   Collection Time: 01/23/15 12:26 PM  Result Value Ref Range Status   MRSA by PCR NEGATIVE NEGATIVE Final    Comment:        The GeneXpert MRSA Assay (FDA approved for NASAL specimens only), is one component of a comprehensive MRSA colonization surveillance program. It is not intended to diagnose MRSA infection nor to guide or monitor treatment for MRSA infections.    Medical History: Past Medical History  Diagnosis Date  . Essential hypertension   . Prostatic hypertrophy   . Melanoma of back   . Asbestosis   . Sleep apnea     Intolerant of CPAP  . GERD (gastroesophageal reflux disease)   . Arthritis   . 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   Medications:  Scheduled:  . apixaban  5 mg Oral BID  . aspirin EC  325 mg Oral Daily  . atorvastatin  80 mg Oral q morning - 10a  . clonazePAM  0.5 mg Oral QHS  . diltiazem  240 mg Oral Daily  . doxazosin  4 mg Oral BID  .  losartan  50 mg Oral Daily  . metoprolol tartrate  50 mg Oral BID   Assessment: 69yo male with atrial flutter and amiodarone added per MD.  Asked to review for possible drug interactions as listed below:  Atorvastatin: Consider decrease atorvastatin dose.  Combo may increase statin levels and risk of myopathy and rhabdomyolysis.  Diltiazem:  Monitor BP and ECG.  May increase levels of both drugs and risk of hypotension, bradycardia, AV block, and cardiac arrhythmias.  Metoprolol:  Monitor BP and HR.  Consider lower dose of metoprolol.  May increase risk of hypotension, bradycardia, or AV block.  Apixaban:  Caution advised.  May increase apixaban levels and risk of bleeding.  Clonazepam:  Caution advised.  May increase risk of CNS depression and psychomotor impairment.    Doxazosin:  Combo may  increase levels and risk of adverse effects. Consider lower dose of doxazosin.  Goal of Therapy:  Reduce risk of adverse effects  Plan:  Monitor BP, HR and ECG closely Monitor for any s/sx's of adverse effects Defer any dosage adjustments to MD  Hart Robinsons A 01/24/2015,5:34 PM

## 2015-01-25 DIAGNOSIS — I2510 Atherosclerotic heart disease of native coronary artery without angina pectoris: Secondary | ICD-10-CM

## 2015-01-25 LAB — BASIC METABOLIC PANEL
Anion gap: 6 (ref 5–15)
BUN: 19 mg/dL (ref 6–23)
CHLORIDE: 102 mmol/L (ref 96–112)
CO2: 26 mmol/L (ref 19–32)
CREATININE: 1.1 mg/dL (ref 0.50–1.35)
Calcium: 8.3 mg/dL — ABNORMAL LOW (ref 8.4–10.5)
GFR calc Af Amer: 78 mL/min — ABNORMAL LOW (ref >90)
GFR, EST NON AFRICAN AMERICAN: 67 mL/min — AB (ref >90)
Glucose, Bld: 115 mg/dL — ABNORMAL HIGH (ref 70–99)
Potassium: 4.1 mmol/L (ref 3.5–5.1)
Sodium: 134 mmol/L — ABNORMAL LOW (ref 135–145)

## 2015-01-25 LAB — HEMOGLOBIN A1C
Hgb A1c MFr Bld: 5.6 % (ref 4.8–5.6)
Mean Plasma Glucose: 114 mg/dL

## 2015-01-25 LAB — MAGNESIUM: Magnesium: 2.1 mg/dL (ref 1.5–2.5)

## 2015-01-25 MED ORDER — METOPROLOL TARTRATE 50 MG PO TABS: 50.0000 mg | ORAL_TABLET | Freq: Two times a day (BID) | ORAL | Status: DC

## 2015-01-25 MED ORDER — SODIUM CHLORIDE 0.9 % IV BOLUS (SEPSIS)
500.0000 mL | Freq: Once | INTRAVENOUS | Status: AC
  Administered 2015-01-25: 500 mL via INTRAVENOUS

## 2015-01-25 MED ORDER — METOPROLOL TARTRATE 50 MG PO TABS: 50.0000 mg | ORAL_TABLET | Freq: Three times a day (TID) | ORAL | Status: DC

## 2015-01-25 MED ORDER — ASPIRIN EC 81 MG PO TBEC
81.0000 mg | DELAYED_RELEASE_TABLET | Freq: Every day | ORAL | Status: DC
  Administered 2015-01-26 – 2015-01-29 (×4): 81 mg via ORAL
  Filled 2015-01-25 (×4): qty 1

## 2015-01-25 NOTE — Care Management Note (Addendum)
    Page 1 of 1   01/29/2015     9:19:56 AM CARE MANAGEMENT NOTE 01/29/2015  Patient:  Devin [name], Devin [name]   Account Number:  [id number]  Date Initiated:  01/25/2015  Documentation initiated by:  Jolene Provost  Subjective/Objective Assessment:   Pt is from home, lives with wife and independent at baseline. Pt recently underwent CABG. Pt uses a cane to ambulate. Pt has no other DME's or [place] services prior to admission. Pt to start cardiac rehab soon. Pt started on Eliquis this admiss     Action/Plan:   Pt plans to discharge home with self care. Will do benefits check for Eliquis. Pt has used CPAP in the past and is interested in sleep study to get another one. Will discuss with PCP. Will cont to follow.   Anticipated DC Date:  01/28/2015   Anticipated DC Plan:  [place]  CM consult      Choice offered to / List presented to:             Status of service:  Completed, signed off Medicare Important Message given?  YES (If response is "NO", the following Medicare IM given date fields will be blank) Date Medicare IM given:  01/29/2015 Medicare IM given by:  Jolene Provost Date Additional Medicare IM given:   Additional Medicare IM given by:    Discharge Disposition:  HOME/SELF CARE  Per UR Regulation:  Reviewed for med. necessity/level of care/duration of stay  If discussed at [place] of Stay Meetings, dates discussed:   01/28/2015    Comments:  01/29/2015 0900 Jolene Provost, RN, MSN, CM Pt discharging home with self care. No CM needs.  01/26/2015 [place], RN, MSN, CM Benefits check done for Xeralto and Eliquis, copay will be $334/month. Pt unable to afford this, dicussed with MD and RN in Cardiology office. Pt given 30-day free discount card for Alen Blew and pt given application for [place] assistance. Pt instructed to fill out and return to cardiology office. Pt very concerned about price of medication. Pt instructed to discuss  options furter with MD.  01/25/2015 [place], RN, MSN, CM

## 2015-01-25 NOTE — Care Management Utilization Note (Signed)
UR completed 

## 2015-01-25 NOTE — Progress Notes (Signed)
Nutrition Brief Note  Patient identified on the Malnutrition Screening Tool (MST) Report  Wt Readings from Last 15 Encounters:  01/25/15 261 lb 0.4 oz (118.4 kg)  01/18/15 268 lb 9.6 oz (121.836 kg)  01/15/15 272 lb (123.378 kg)  01/08/15 269 lb (122.018 kg)  01/03/15 277 lb 12.5 oz (126 kg)    Body mass index is 35.39 kg/(m^2). 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 His weight decrease is desirable given his diuretic therapy in addition to very good meal intake.  Current diet order is Heart Healthy. His breakfast and lunch tray observed (>75%) consumed. Patient endorses good appetite.  Labs and medications reviewed.   No nutrition interventions warranted at this time. If nutrition issues arise, please consult RD.   Colman Cater MS,RD,CSG,LDN Office: [phone] Pager: [phone]

## 2015-01-25 NOTE — Progress Notes (Signed)
Contacted by nursing staff. Patient with some low blood pressures 80/40s, asymptomatic. Heart rates 60s. Patient received dilt 204 this AM, along with losartan 50, and lopressor 50mg , doxazosin 4mg . Likely related to AM meds, will stop all except continue metoprolol with old parameters. Will hold amio gtt for now, if rates increase would start back at 30 mg/hr. Will given NS 542mL bolus now, asked nursing if bp's don't improve to contact primary team.   Wyline Mood MD

## 2015-01-25 NOTE — Progress Notes (Signed)
Subjective: Devin [name] was admitted this weekend with rapid atrial fibrillation. He is status post bypass surgery. His postoperative course had been complicated by left pleural effusion. He denies chest pain or shortness of breath today. He has been treated with amiodarone. He has had atrial flutter.   Objective: Vital signs in last 24 hours: Filed Vitals:   01/25/15 0600 01/25/15 0615 01/25/15 0630 01/25/15 0700  BP: 106/66 108/65 119/68 119/70  Pulse: 71 53 69 68  Temp:      TempSrc:      Resp: 15 17 11 19   Height:      Weight:      SpO2: 95% 97% 98% 86%   Weight change: 3 lb 0.4 oz (1.372 kg)  Intake/Output Summary (Last 24 hours) at 01/25/15 0742 Last data filed at 01/25/15 0600  Gross per 24 hour  Intake 2300.19 ml  Output    875 ml  Net 1425.19 ml    Physical Exam: Alert. No distress. Lungs clear. Heart irregularly irregular. Right leg reveals persistent swelling and bruising. Abdomen soft and nontender.  Lab Results:    Results for orders placed or performed during the hospital encounter of 01/23/15 (from the past 24 hour(s))  Basic metabolic panel     Status: Abnormal   Collection Time: 01/25/15  5:23 AM  Result Value Ref Range   Sodium 134 (L) 135 - 145 mmol/L   Potassium 4.1 3.5 - 5.1 mmol/L   Chloride 102 96 - 112 mmol/L   CO2 26 19 - 32 mmol/L   Glucose, Bld 115 (H) 70 - 99 mg/dL   BUN 19 6 - 23 mg/dL   Creatinine, Ser 1.10 0.50 - 1.35 mg/dL   Calcium 8.3 (L) 8.4 - 10.5 mg/dL   GFR calc non Af Amer 67 (L) >90 mL/min   GFR calc Af Amer 78 (L) >90 mL/min   Anion gap 6 5 - 15     ABGS No results for input(s): PHART, PO2ART, TCO2, HCO3 in the last 72 hours.  Invalid input(s): PCO2 CULTURES Recent Results (from the past 240 hour(s))  MRSA PCR Screening     Status: None   Collection Time: 01/23/15 12:26 PM  Result Value Ref Range Status   MRSA by PCR NEGATIVE NEGATIVE Final    Comment:        The GeneXpert MRSA Assay (FDA approved for NASAL  specimens only), is one component of a comprehensive MRSA colonization surveillance program. It is not intended to diagnose MRSA infection nor to guide or monitor treatment for MRSA infections.    Studies/Results: Dg Chest 1 View  01/23/2015   CLINICAL DATA:  Cardiac arrhythmia. Hypotension. Coronary artery bypass grafting 3.5 weeks prior  EXAM: CHEST  1 VIEW  COMPARISON:  January 15, 2015  FINDINGS: There is a persistent small left effusion with left base atelectasis. Lungs elsewhere clear. Heart is mildly enlarged with pulmonary vascularity within normal limits. No adenopathy. Patient is status post coronary artery bypass grafting. No pneumothorax. No bone lesions.  IMPRESSION: Persistent small left effusion with mild left base atelectasis. Lungs otherwise clear. Heart mildly enlarged with pulmonary vascularity within normal limits.   Electronically Signed   By: Lowella Grip III M.D.   On: 01/23/2015 10:40   Micro Results: Recent Results (from the past 240 hour(s))  MRSA PCR Screening     Status: None   Collection Time: 01/23/15 12:26 PM  Result Value Ref Range Status   MRSA by PCR NEGATIVE NEGATIVE Final  Comment:        The GeneXpert MRSA Assay (FDA approved for NASAL specimens only), is one component of a comprehensive MRSA colonization surveillance program. It is not intended to diagnose MRSA infection nor to guide or monitor treatment for MRSA infections.    Studies/Results: Dg Chest 1 View  01/23/2015   CLINICAL DATA:  Cardiac arrhythmia. Hypotension. Coronary artery bypass grafting 3.5 weeks prior  EXAM: CHEST  1 VIEW  COMPARISON:  January 15, 2015  FINDINGS: There is a persistent small left effusion with left base atelectasis. Lungs elsewhere clear. Heart is mildly enlarged with pulmonary vascularity within normal limits. No adenopathy. Patient is status post coronary artery bypass grafting. No pneumothorax. No bone lesions.  IMPRESSION: Persistent small left effusion  with mild left base atelectasis. Lungs otherwise clear. Heart mildly enlarged with pulmonary vascularity within normal limits.   Electronically Signed   By: Lowella Grip III M.D.   On: 01/23/2015 10:40   Medications:  I have reviewed the patient's current medications Scheduled Meds: . apixaban  5 mg Oral BID  . aspirin EC  325 mg Oral Daily  . atorvastatin  80 mg Oral q morning - 10a  . clonazePAM  0.5 mg Oral QHS  . diltiazem  240 mg Oral Daily  . doxazosin  4 mg Oral BID  . losartan  50 mg Oral Daily  . metoprolol tartrate  50 mg Oral BID   Continuous Infusions: . amiodarone 30 mg/hr (01/25/15 0517)   PRN Meds:.acetaminophen, ondansetron (ZOFRAN) IV, traMADol   Assessment/Plan: #1. Atrial fibrillation/atrial flutter. Consult cardiology. #2. Small left pleural effusion. #3. Coronary artery disease status post bypass surgery. #4. BPH. Continue doxazosin. Active Problems:   CAD (coronary artery disease), native coronary artery   S/P CABG x 4   Essential hypertension   Atrial fibrillation and flutter   Palpitations   Hyperlipidemia   BPH (benign prostatic hyperplasia)   Chronic diastolic CHF (congestive heart failure)   Atrial fibrillation with RVR     LOS: 2 days   Klair Leising 01/25/2015, 7:42 AM

## 2015-01-25 NOTE — Progress Notes (Signed)
Dr Cindie Laroche notified of pt BP 54/43 When rechecked BP 104/45 Pt receiving 500 ml bolus normal saline per protocol ok'd by Dr Cindie Laroche

## 2015-01-25 NOTE — Consult Note (Signed)
CARDIOLOGY CONSULT NOTE   [name] ID: Devin [name] MRN: [id number] DOB/AGE: 1946/04/17 69 y.o.  Admit Date: 01/23/2015 Referring Physician: [name] MD Primary Physician: [name], MD Consulting Cardiologist: Harl Bowie. Roderic Palau MD Primary Cardiologist : Rozann Lesches MD Reason for Consultation: Atrial fib, CAD s/p CABG  Clinical Summary Devin [name] is a 69 y.o.male with known history of CAD,CABG in 2016 after assessment for NSTEMI, receiving (LIMA to LAD, SVG to OM1, SVG to OM2, SVG to PDA). He was seen in our office twice within 5 days for complaints of LEE, ruled out for DVT but was treated for cellulitis with Keflex, occasional palpitations, and soreness in his chest from surgery. CXR showing slight interval increase of in size of Devin left pleural effusion. Lasix was recommended but he refused as it causes him to throw up. When he has fluid retention, he takes an extra dose of doxazosin and is now up to BID.  On day of admission presented to ER with palpitations. States that he awoke in Devin middle of Devin night with palpiaitons and diaphoresis. He sleeps in a recliner since having Devin CABG as he often feels palpitations when he lays in Devin bed. He says he sleeps better in Devin recliner, no problems, breathing.  He waited until Devin next morning, as he wife was in Devin ER with Devin [name]'s s on. He states he took his BP and it was "all over Devin place, but waited as he wanted his wife to rest. On arrival to Devin in atrial fib with RVR  HR of 140 bpm. BP 123/84. O2 SAt 97%.   He was not found to be anemic, Glucose was 167; troponin 0.03; CXR revealed persistent left pleural effusion, with mild left base atelectasis. He was given diltiazem bolus started on a gtt. He was given amiodarone by Dr. Lorriane Shire, Elilquis 5 mg BID, (CHADS VASC Score of 3). He has been transitioned to po diltiazem, 240 mg daily, continues on amiodarone gtt at 30 mg/hour, continued on metoprolol 50 mg BID, ASA. Heart rate  is still labile with rates as high as 120 bpm, with lowest in Devin 90's.   Other history includes hypertension, OSA but refuses CPAP as he states he cannot tolerate this,  hypercholesterolemia.    Allergies  Allergen Reactions  . Lasix [Furosemide] Nausea Only    Medications Scheduled Medications: . apixaban  5 mg Oral BID  . aspirin EC  325 mg Oral Daily  . atorvastatin  80 mg Oral q morning - 10a  . clonazePAM  0.5 mg Oral QHS  . diltiazem  240 mg Oral Daily  . doxazosin  4 mg Oral BID  . losartan  50 mg Oral Daily  . metoprolol tartrate  50 mg Oral BID    Infusions: . amiodarone 30 mg/hr (01/25/15 0517)    PRN Medications: acetaminophen, ondansetron (ZOFRAN) IV, traMADol   Past Medical History  Diagnosis Date  . Essential hypertension   . Prostatic hypertrophy   . Melanoma of back   . Asbestosis   . Sleep apnea     Intolerant of CPAP  . GERD (gastroesophageal reflux disease)   . Arthritis   . 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    Past Surgical History  Procedure Laterality Date  . Anterior cervical decomp/discectomy fusion    . Shoulder open rotator cuff repair  Left   . Knee arthroscopy Right   . Knee cartilage surgery Left   . Melanoma excision      "back"  . Left heart catheterization with coronary angiogram N/A 12/28/2014    Procedure: LEFT HEART CATHETERIZATION WITH CORONARY ANGIOGRAM;  Surgeon: Lorretta Harp, MD;  Location: [facility] CATH LAB;  Service: Cardiovascular;  Laterality: N/A;  . Coronary artery bypass graft N/A 12/29/2014    Procedure: CORONARY ARTERY BYPASS GRAFTING (CABG), ON PUMP, TIMES FOUR, USING LEFT INTERNAL MAMMARY ARTERY, RIGHT GREATER SAPHENOUS VEIN HARVESTED ENDOSCOPICALLY;  Surgeon: Ivin Poot, MD;  Location: [place];  Service: Open Heart Surgery;  Laterality: N/A;  . Tee without cardioversion N/A 12/29/2014    Procedure: TRANSESOPHAGEAL  ECHOCARDIOGRAM (TEE);  Surgeon: Ivin Poot, MD;  Location: [place];  Service: Open Heart Surgery;  Laterality: N/A;    Family History  Problem Relation Age of Onset  . Stroke Mother   . Hypertension Mother   . Heart disease Sister     Died from complications of RHD  . Heart disease Brother     Heart failure related to EtOH and smoking  . Stomach cancer Sister     Social History Mr. Buhl reports that he has never smoked. He has never used smokeless tobacco. Mr. Wiederholt reports that he drinks alcohol.  Review of Systems Complete review of systems are found to be negative unless outlined in H&P above.  Physical Examination Blood pressure 112/61, pulse 68, temperature 97.7 F (36.5 C), temperature source Oral, resp. rate 13, height 6' (1.829 m), weight 261 lb 0.4 oz (118.4 kg), SpO2 96 %.  Intake/Output Summary (Last 24 hours) at 01/25/15 0836 Last data filed at 01/25/15 0600  Gross per 24 hour  Intake 2297.69 ml  Output    875 ml  Net 1422.69 ml    Telemetry: Atrial fibrillation rates variable 90's-120's.  GEN: No acute distress.  HEENT: Conjunctiva and lids normal, oropharynx clear with moist mucosa. Neck: Supple, no elevated JVP or carotid bruits, no thyromegaly. Lungs: Crackles in Devin left base, no wheezes or coughing. Cardiac: Irregular rate and rhythm, tachycardic no S3 or significant systolic murmur, no pericardial rub. Abdomen: Soft, nontender, no hepatomegaly, bowel sounds present, no guarding or rebound. Extremities: No pitting edema, distal pulses 2+. Healing cellulitis of Devin right lower leg. Skin: Warm and dry. Musculoskeletal: No kyphosis. Neuropsychiatric: Alert and oriented x3, affect grossly appropriate.  Prior Cardiac Testing/Procedures  1.Echocardiogram: 12/28/2014  Left ventricle: Devin cavity size was normal. Wall thickness was normal. Systolic function was normal. Devin estimated ejection fraction was in Devin range of 60% to 65%. Wall motion was  normal; there were no regional wall motion abnormalities. Features are consistent with a pseudonormal left ventricular filling pattern, with concomitant abnormal relaxation and increased filling pressure (grade 2 diastolic dysfunction). - Aortic valve: There was trivial regurgitation. - Right ventricle: Devin cavity size was mildly dilated. Systolic function was mildly reduced. - Right atrium: Devin atrium was mildly dilated. - Pulmonary arteries: Systolic pressure was mildly increased. PA peak pressure: 38 mm Hg (S).  Cardiac Cath 12/28/2014 1. Left main; normal  2. LAD; 75% segmental tapered proximal, 95% tandem mid 3. Left circumflex; 75% distal AV groove.  4. Right coronary artery; this was a dominant vessel and was Devin infarct-related artery. There was a 95% mid stenosis as well as a 90% ostial PDA stenosis 7. Left ventriculography; RAO left ventriculogram was performed using  25 mL of Visipaque dye at 12 mL/second.  Devin overall LVEF estimated  60 % Without wall motion abnormalities  Lab Results  Basic Metabolic Panel:  Recent Labs Lab 01/23/15 1012 01/24/15 0425 01/25/15 0523  NA 137 138 134*  K 4.0 4.2 4.1  CL 105 107 102  CO2 26 25 26   GLUCOSE 167* 107* 115*  BUN 20 20 19   CREATININE 1.18 1.07 1.10  CALCIUM 8.6 8.4 8.3*    Liver Function Tests:  Recent Labs Lab 01/23/15 1012  AST 13  ALT 18  ALKPHOS 88  BILITOT 0.7  PROT 7.0  ALBUMIN 3.6    CBC:  Recent Labs Lab 01/23/15 1012 01/24/15 0425  WBC 4.3 4.3  NEUTROABS 3.0  --   HGB 13.1 12.7*  HCT 40.3 40.2  MCV 95.5 95.5  PLT 204 161    Cardiac Enzymes:  Recent Labs Lab 01/23/15 1012 01/23/15 1629 01/23/15 2253 01/24/15 0425  TROPONINI 0.03 0.05* 0.04* 0.04*    Radiology: Dg Chest 1 View  01/23/2015   CLINICAL DATA:  Cardiac arrhythmia. Hypotension. Coronary artery bypass grafting 3.5 weeks prior  EXAM: CHEST  1 VIEW  COMPARISON:  January 15, 2015  FINDINGS: There is a  persistent small left effusion with left base atelectasis. Lungs elsewhere clear. Heart is mildly enlarged with pulmonary vascularity within normal limits. No adenopathy. [name] is status post coronary artery bypass grafting. No pneumothorax. No bone lesions.  IMPRESSION: Persistent small left effusion with mild left base atelectasis. Lungs otherwise clear. Heart mildly enlarged with pulmonary vascularity within normal limits.   Electronically Signed   By: Lowella Grip III M.D.   On: 01/23/2015 10:40     ECG: Atrial fibrillation rate of 114 bpm.    Impression and Recommendations  1. Atrial fibrillation: Mulitfactorial, s/p CABG one month ago and hx of OSA with non-compliance with CPAP. Agree with use of Eliquis, creatinine of 1.10, GFR of 67. He is back on home doses of metoprolol 50 mg BID, but now diltiazem has been added at 240 mg daily,(hasn't received this yet this am), and he continues on amiodarone gtt (started at 4pm yesterday). HR remains elevated. BP remains stable. Will need to transition off of amiodarone gtt to po once full load is given in IV form until 1 gm is reached. Will discuss with Dr. Harl Bowie whether to continue this medication along with diltiazem and metoprolol, as HR is not currently optimal.   2. CAD: S/P CABG (4 vessel) Feb 2016: Currently on ASA, ARB, and statin. Along with BB as discussed above. Some soreness on Devin left chest from surgery, but no acute pain.Need better HR control.  3. BPH: [name] is on dozasosin BID and uses this also to assist with diuresis as he is not taking lasix causing nausea and vomiting.   Signed: Phill Myron. Lawrence NP [facility]  01/25/2015, 8:36 AM Co-Sign MD  [name] seen and discussed with NP Devin [name], I agree with her documentation above. 69 yo male hx of CAD with prior CABG 12/2014 after NSTEMI, HTN, OSA,    11/2014 echo: LVEF 60-65%, no WMAs, grade II diastolic dysfunction.  TSH 1.6, K 4, Cr 1.18,  Hgb 13.1, Plt 204, trop 0.05 to  0.04. CXR small left effusion, no edema EKG aflutter RBBB  He is currently on metoprolol 50mg  bid, diltiazem 240mg , and amio gtt. Started on eliquis 5mg  bid for stroke prevention in setting of CHADS2Vasc score of 3. To streamline his initial therapy will stop dilt (he got AM dose), increase lopressor to 50mg  tid with  hold parameters, continue amio gtt. Add Mg to AM labs.      Zandra Abts MD

## 2015-01-26 LAB — CBC
HCT: 41.1 % (ref 39.0–52.0)
HEMOGLOBIN: 13.5 g/dL (ref 13.0–17.0)
MCH: 31.2 pg (ref 26.0–34.0)
MCHC: 32.8 g/dL (ref 30.0–36.0)
MCV: 94.9 fL (ref 78.0–100.0)
PLATELETS: 154 10*3/uL (ref 150–400)
RBC: 4.33 MIL/uL (ref 4.22–5.81)
RDW: 13.4 % (ref 11.5–15.5)
WBC: 4.9 10*3/uL (ref 4.0–10.5)

## 2015-01-26 LAB — BASIC METABOLIC PANEL
ANION GAP: 6 (ref 5–15)
BUN: 19 mg/dL (ref 6–23)
CO2: 26 mmol/L (ref 19–32)
Calcium: 8.4 mg/dL (ref 8.4–10.5)
Chloride: 105 mmol/L (ref 96–112)
Creatinine, Ser: 1.05 mg/dL (ref 0.50–1.35)
GFR, EST AFRICAN AMERICAN: 82 mL/min — AB (ref >90)
GFR, EST NON AFRICAN AMERICAN: 70 mL/min — AB (ref >90)
Glucose, Bld: 126 mg/dL — ABNORMAL HIGH (ref 70–99)
Potassium: 4.3 mmol/L (ref 3.5–5.1)
Sodium: 137 mmol/L (ref 135–145)

## 2015-01-26 MED ORDER — DOXAZOSIN MESYLATE 2 MG PO TABS
4.0000 mg | ORAL_TABLET | Freq: Two times a day (BID) | ORAL | Status: DC
  Administered 2015-01-26 – 2015-01-29 (×7): 4 mg via ORAL
  Filled 2015-01-26 (×7): qty 2

## 2015-01-26 MED ORDER — AMIODARONE HCL IN DEXTROSE 360-4.14 MG/200ML-% IV SOLN
30.0000 mg/h | INTRAVENOUS | Status: DC
  Administered 2015-01-26 – 2015-01-27 (×3): 30 mg/h via INTRAVENOUS
  Filled 2015-01-26 (×2): qty 200

## 2015-01-26 MED ORDER — AMIODARONE HCL IN DEXTROSE 360-4.14 MG/200ML-% IV SOLN
60.0000 mg/h | INTRAVENOUS | Status: AC
  Administered 2015-01-26: 60 mg/h via INTRAVENOUS
  Filled 2015-01-26 (×2): qty 200

## 2015-01-26 MED ORDER — METOPROLOL TARTRATE 25 MG PO TABS
25.0000 mg | ORAL_TABLET | Freq: Four times a day (QID) | ORAL | Status: DC
  Administered 2015-01-26 (×4): 25 mg via ORAL
  Filled 2015-01-26 (×4): qty 1

## 2015-01-26 MED ORDER — AMIODARONE HCL 200 MG PO TABS
200.0000 mg | ORAL_TABLET | Freq: Two times a day (BID) | ORAL | Status: DC
  Administered 2015-01-26: 200 mg via ORAL
  Filled 2015-01-26: qty 1

## 2015-01-26 NOTE — Progress Notes (Signed)
Subjective: [name] has had low blood pressures yesterday afternoon and last night it responded to fluid boluses. He has remained in atrial flutter or atrial fibrillation. Antihypertensives and rate limiting agents were held late yesterday. Objective: Vital signs in last 24 hours: Filed Vitals:   01/26/15 0400 01/26/15 0500 01/26/15 0600 01/26/15 0630  BP: 110/58 93/47 122/71   Pulse: 66 89 141 87  Temp: 98.1 F (36.7 C)     TempSrc: Oral     Resp: 16 15 13 14   Height:      Weight:  264 lb 5.3 oz (119.9 kg)    SpO2: 93% 96% 94% 95%   Weight change: 3 lb 4.9 oz (1.5 kg)  Intake/Output Summary (Last 24 hours) at 01/26/15 0654 Last data filed at 01/26/15 0500  Gross per 24 hour  Intake    220 ml  Output   1275 ml  Net  -1055 ml    Physical Exam: Alert. No distress. Lungs clear. Heart irregularly irregular and tachycardic now in the 110 range after he had been in the 70 range overnight consistently. Abdomen soft and nontender.  Lab Results:   No results found for this or any previous visit (from the past 24 hour(s)).   ABGS No results for input(s): PHART, PO2ART, TCO2, HCO3 in the last 72 hours.  Invalid input(s): PCO2 CULTURES Recent Results (from the past 240 hour(s))  MRSA PCR Screening     Status: None   Collection Time: 01/23/15 12:26 PM  Result Value Ref Range Status   MRSA by PCR NEGATIVE NEGATIVE Final    Comment:        The GeneXpert MRSA Assay (FDA approved for NASAL specimens only), is one component of a comprehensive MRSA colonization surveillance program. It is not intended to diagnose MRSA infection nor to guide or monitor treatment for MRSA infections.    Studies/Results: No results found. Micro Results: Recent Results (from the past 240 hour(s))  MRSA PCR Screening     Status: None   Collection Time: 01/23/15 12:26 PM  Result Value Ref Range Status   MRSA by PCR NEGATIVE NEGATIVE Final    Comment:        The GeneXpert MRSA Assay  (FDA approved for NASAL specimens only), is one component of a comprehensive MRSA colonization surveillance program. It is not intended to diagnose MRSA infection nor to guide or monitor treatment for MRSA infections.    Studies/Results: No results found. Medications:  I have reviewed the patient's current medications Scheduled Meds: . apixaban  5 mg Oral BID  . aspirin EC  81 mg Oral Daily  . clonazePAM  0.5 mg Oral QHS  . metoprolol tartrate  50 mg Oral BID   Continuous Infusions:  PRN Meds:.acetaminophen, ondansetron (ZOFRAN) IV, traMADol   Assessment/Plan: #1. Atrial fibrillation/flutter. Await further adjustment of his rate limiting agents by cardiology this morning. Presently normotensive. #2. Status post CABG. #3. BPH. Restart doxazosin. Active Problems:   CAD (coronary artery disease), native coronary artery   S/P CABG x 4   Essential hypertension   Atrial fibrillation and flutter   Palpitations   Hyperlipidemia   BPH (benign prostatic hyperplasia)   Chronic diastolic CHF (congestive heart failure)   Atrial fibrillation with RVR     LOS: 3 days   Aurthur Wingerter 01/26/2015, 6:54 AM

## 2015-01-26 NOTE — Progress Notes (Signed)
Consulting cardiologist: Carlyle Dolly MD Primary Cardiologist:MCDowell, Mikeal Hawthorne MD  Cardiology Specific Problem List:  1. Atrial fibrillation 2. CAD S/P CABG  Subjective:    Feeling better today. Events of yesterday reviewed. No complaints currently.  Objective:   Temp:  [97.7 F (36.5 C)-98.5 F (36.9 C)] 97.9 F (36.6 C) (03/29 0735) Pulse Rate:  [47-141] 87 (03/29 0630) Resp:  [12-21] 14 (03/29 0630) BP: (54-122)/(37-76) 122/71 mmHg (03/29 0600) SpO2:  [92 %-98 %] 95 % (03/29 0630) Weight:  [264 lb 5.3 oz (119.9 kg)] 264 lb 5.3 oz (119.9 kg) (03/29 0500) Last BM Date: 01/22/15  Filed Weights   01/24/15 0449 01/25/15 0500 01/26/15 0500  Weight: 260 lb 2.3 oz (118 kg) 261 lb 0.4 oz (118.4 kg) 264 lb 5.3 oz (119.9 kg)    Intake/Output Summary (Last 24 hours) at 01/26/15 0745 Last data filed at 01/26/15 0500  Gross per 24 hour  Intake    220 ml  Output   1275 ml  Net  -1055 ml    Telemetry: Atrial fibrillation rates 80'-100 bpm.  Exam:  General: No acute distress.  HEENT: Conjunctiva and lids normal, oropharynx clear.  Lungs: Clear to auscultation, nonlabored.  Cardiac: No elevated JVP or bruits. RRR, no gallop or rub.   Abdomen: Normoactive bowel sounds, nontender, nondistended.  Extremities: No pitting edema, mild non-pitting edema in the right leg. distal pulses full.  Neuropsychiatric: Alert and oriented x3, affect appropriate.   Lab Results:  Basic Metabolic Panel:  Recent Labs Lab 01/23/15 1012 01/24/15 0425 01/25/15 0523  NA 137 138 134*  K 4.0 4.2 4.1  CL 105 107 102  CO2 26 25 26   GLUCOSE 167* 107* 115*  BUN 20 20 19   CREATININE 1.18 1.07 1.10  CALCIUM 8.6 8.4 8.3*  MG  --   --  2.1    Liver Function Tests:  Recent Labs Lab 01/23/15 1012  AST 13  ALT 18  ALKPHOS 88  BILITOT 0.7  PROT 7.0  ALBUMIN 3.6    CBC:  Recent Labs Lab 01/23/15 1012 01/24/15 0425  WBC 4.3 4.3  HGB 13.1 12.7*  HCT 40.3 40.2  MCV  95.5 95.5  PLT 204 161    Cardiac Enzymes:  Recent Labs Lab 01/23/15 1629 01/23/15 2253 01/24/15 0425  TROPONINI 0.05* 0.04* 0.04*    BNP:  Recent Labs  01/15/15 1454  PROBNP 525.90*     Medications:   Scheduled Medications: . apixaban  5 mg Oral BID  . aspirin EC  81 mg Oral Daily  . clonazePAM  0.5 mg Oral QHS  . doxazosin  4 mg Oral Q12H  . metoprolol tartrate  50 mg Oral BID    Infusions:    PRN Medications: acetaminophen, ondansetron (ZOFRAN) IV, traMADol   Assessment and Plan:   1. Atrial fibrillation: Review of telemetry revealed some atrial flutter as well. HR now in 80's to low 90's at rest, goes up with some anxiety. Diltiazem and amiodarone are discontinued.  Metoprolol has been decreased to 50 BID (home dose). He continues on Eliquis 5 mg BID. Will check BMET this am. He apparently got bradycardic and hypotensive after receiving his medications earlier in the day. Question cumulative affect. Doing better now. Has not received metoprolol yet this am.  Will try 25 mg QID to begin with with parameters, if he tolerates the 100 mg total for the day, can go back to 50 mg BID or long acting. Magnesium 2.1 this am.  2. CAD: S/P CABG Continue ASA. He denies chest pain. Troponin has been negative.      Phill Myron. Lawrence NP [place]  01/26/2015, 7:45 AM   Patient seen and discussed with NP Purcell Nails, I agree with her documentation above. Patient had some hypotension yesterday likely medication related. BP meds held, bp has improved this morning. Will not restart diltiazem. Rates increased this AM, will restart amio gtt and stop PO, he did not complete his 24 hour load yesterday. Continue eliuqis.    Zandra Abts MD

## 2015-01-27 LAB — BASIC METABOLIC PANEL
Anion gap: 5 (ref 5–15)
BUN: 15 mg/dL (ref 6–23)
CO2: 29 mmol/L (ref 19–32)
CREATININE: 1.08 mg/dL (ref 0.50–1.35)
Calcium: 8.4 mg/dL (ref 8.4–10.5)
Chloride: 103 mmol/L (ref 96–112)
GFR calc Af Amer: 79 mL/min — ABNORMAL LOW (ref >90)
GFR calc non Af Amer: 68 mL/min — ABNORMAL LOW (ref >90)
Glucose, Bld: 107 mg/dL — ABNORMAL HIGH (ref 70–99)
Potassium: 4.2 mmol/L (ref 3.5–5.1)
SODIUM: 137 mmol/L (ref 135–145)

## 2015-01-27 MED ORDER — SODIUM CHLORIDE 0.9 % IV BOLUS (SEPSIS)
500.0000 mL | Freq: Once | INTRAVENOUS | Status: AC
  Administered 2015-01-27: 500 mL via INTRAVENOUS

## 2015-01-27 MED ORDER — RIVAROXABAN 20 MG PO TABS: 20.0000 mg | ORAL_TABLET | Freq: Every day | ORAL | Status: DC

## 2015-01-27 MED ORDER — METOPROLOL TARTRATE 25 MG PO TABS
25.0000 mg | ORAL_TABLET | Freq: Two times a day (BID) | ORAL | Status: DC
  Administered 2015-01-27 (×2): 25 mg via ORAL
  Filled 2015-01-27 (×2): qty 1

## 2015-01-27 NOTE — Progress Notes (Signed)
Patient ID: Devin [name], male   DOB: [birth year], 69 y.o.   MRN: [id number]    Primary cardiologist:  Subjective:    No complaints. Some low bp's overnight that responded to IVF  Objective:   Temp:  [97.6 F (36.4 C)-98.4 F (36.9 C)] 98 F (36.7 C) (03/30 0804) Pulse Rate:  [42-143] 57 (03/30 0400) Resp:  [7-22] 16 (03/30 0400) BP: (71-134)/(35-72) 134/66 mmHg (03/30 0400) SpO2:  [90 %-99 %] 97 % (03/30 0400) Weight:  [266 lb 5.1 oz (120.8 kg)] 266 lb 5.1 oz (120.8 kg) (03/30 0400) Last BM Date: 01/26/15  Filed Weights   01/25/15 0500 01/26/15 0500 01/27/15 0400  Weight: 261 lb 0.4 oz (118.4 kg) 264 lb 5.3 oz (119.9 kg) 266 lb 5.1 oz (120.8 kg)    Intake/Output Summary (Last 24 hours) at 01/27/15 0847 Last data filed at 01/27/15 0612  Gross per 24 hour  Intake      0 ml  Output   1075 ml  Net  -1075 ml    Telemetry: aflutter variable rates  Exam:  General: NAD  Resp: CTAB  Cardiac: irreg, no m/r/g, no JVD  GI: abdomen soft, NT, ND  MSK:no LE edema  Neuro: no focal deficits  Psych: appropriate affect  Lab Results:  Basic Metabolic Panel:  Recent Labs Lab 01/25/15 0523 01/26/15 0851 01/27/15 0812  NA 134* 137 137  K 4.1 4.3 4.2  CL 102 105 103  CO2 26 26 29   GLUCOSE 115* 126* 107*  BUN 19 19 15   CREATININE 1.10 1.05 1.08  CALCIUM 8.3* 8.4 8.4  MG 2.1  --   --     Liver Function Tests:  Recent Labs Lab 01/23/15 1012  AST 13  ALT 18  ALKPHOS 88  BILITOT 0.7  PROT 7.0  ALBUMIN 3.6    CBC:  Recent Labs Lab 01/23/15 1012 01/24/15 0425 01/26/15 0851  WBC 4.3 4.3 4.9  HGB 13.1 12.7* 13.5  HCT 40.3 40.2 41.1  MCV 95.5 95.5 94.9  PLT 204 161 154    Cardiac Enzymes:  Recent Labs Lab 01/23/15 1629 01/23/15 2253 01/24/15 0425  TROPONINI 0.05* 0.04* 0.04*    BNP:  Recent Labs  01/15/15 1454  PROBNP 525.90*    Coagulation: No results for input(s): INR in the last 168 hours.  ECG:   Medications:   Scheduled  Medications: . apixaban  5 mg Oral BID  . aspirin EC  81 mg Oral Daily  . clonazePAM  0.5 mg Oral QHS  . doxazosin  4 mg Oral Q12H  . metoprolol tartrate  25 mg Oral QID     Infusions: . amiodarone 30 mg/hr (01/27/15 0612)     PRN Medications:  acetaminophen, ondansetron (ZOFRAN) IV, traMADol     Assessment/Plan    1. Aflutter - currently on lopressor 50mg  bid and amio gtt. Has had intermittent problems with low blood pressures, most recently early this AM systolis in 70s-80s. Yesterday he received cardura 4mg  bid, lopressor 25mg  q 6 hrs, and was on amio gtt. Pressure improved with IVFs overnight, systolics up to 546E. Heart rates 60-90s most of yesterday, with some movement today up to 120s - will continue amio gtt today, decrease lopressor to 25mg  bid. Pending rates will consider TEE/DCCV tomorrow. Will make NPO at midnight.  - he is on eliquis 5mg  bid, case management looking into home costs for either eliquis or xarelto, will need update prior to any discharge.   2. CAD - recent  CABG, has has no chest pain        Carlyle Dolly, M.D.

## 2015-01-27 NOTE — Progress Notes (Signed)
Subjective: He says he feels a little bit better. He is on amiodarone drip. He is still in atrial flutter/fibrillation. His heart rates about 80-90 now. He denies any chest pain. He has no other new symptoms.  Objective: Vital signs in last 24 hours: Temp:  [97.6 F (36.4 C)-98.4 F (36.9 C)] 97.6 F (36.4 C) (03/30 0400) Pulse Rate:  [42-143] 57 (03/30 0400) Resp:  [7-22] 16 (03/30 0400) BP: (71-134)/(35-72) 134/66 mmHg (03/30 0400) SpO2:  [90 %-99 %] 97 % (03/30 0400) Weight:  [120.8 kg (266 lb 5.1 oz)] 120.8 kg (266 lb 5.1 oz) (03/30 0400) Weight change: 0.9 kg (1 lb 15.8 oz) Last BM Date: 01/26/15  Intake/Output from previous day: 03/29 0701 - 03/30 0700 In: -  Out: 1075 [Urine:1075]  PHYSICAL EXAM General appearance: alert, cooperative and mild distress Resp: clear to auscultation bilaterally Cardio: irregularly irregular rhythm GI: soft, non-tender; bowel sounds normal; no masses,  no organomegaly Extremities: extremities normal, atraumatic, no cyanosis or edema  Lab Results:  Results for orders placed or performed during the hospital encounter of 01/23/15 (from the past 48 hour(s))  Basic metabolic panel     Status: Abnormal   Collection Time: 01/26/15  8:51 AM  Result Value Ref Range   Sodium 137 135 - 145 mmol/L   Potassium 4.3 3.5 - 5.1 mmol/L   Chloride 105 96 - 112 mmol/L   CO2 26 19 - 32 mmol/L   Glucose, Bld 126 (H) 70 - 99 mg/dL   BUN 19 6 - 23 mg/dL   Creatinine, Ser 1.05 0.50 - 1.35 mg/dL   Calcium 8.4 8.4 - 10.5 mg/dL   GFR calc non Af Amer 70 (L) >90 mL/min   GFR calc Af Amer 82 (L) >90 mL/min    Comment: (NOTE) The eGFR has been calculated using the CKD EPI equation. This calculation has not been validated in all clinical situations. eGFR's persistently <90 mL/min signify possible Chronic Kidney Disease.    Anion gap 6 5 - 15  CBC     Status: None   Collection Time: 01/26/15  8:51 AM  Result Value Ref Range   WBC 4.9 4.0 - 10.5 K/uL   RBC  4.33 4.22 - 5.81 MIL/uL   Hemoglobin 13.5 13.0 - 17.0 g/dL   HCT 41.1 39.0 - 52.0 %   MCV 94.9 78.0 - 100.0 fL   MCH 31.2 26.0 - 34.0 pg   MCHC 32.8 30.0 - 36.0 g/dL   RDW 13.4 11.5 - 15.5 %   Platelets 154 150 - 400 K/uL    ABGS No results for input(s): PHART, PO2ART, TCO2, HCO3 in the last 72 hours.  Invalid input(s): PCO2 CULTURES Recent Results (from the past 240 hour(s))  MRSA PCR Screening     Status: None   Collection Time: 01/23/15 12:26 PM  Result Value Ref Range Status   MRSA by PCR NEGATIVE NEGATIVE Final    Comment:        The GeneXpert MRSA Assay (FDA approved for NASAL specimens only), is one component of a comprehensive MRSA colonization surveillance program. It is not intended to diagnose MRSA infection nor to guide or monitor treatment for MRSA infections.    Studies/Results: No results found.  Medications:  Prior to Admission:  Prescriptions prior to admission  Medication Sig Dispense Refill Last Dose  . albuterol (PROVENTIL HFA;VENTOLIN HFA) 108 (90 BASE) MCG/ACT inhaler Inhale 1-2 puffs into the lungs every 6 (six) hours as needed for wheezing or shortness of  breath.   unknown  . aspirin EC 325 MG EC tablet Take 1 tablet (325 mg total) by mouth daily. 30 tablet 0 01/22/2015 at Unknown time  . atorvastatin (LIPITOR) 80 MG tablet Take 1 tablet (80 mg total) by mouth daily at 6 PM. (Patient taking differently: Take 80 mg by mouth every morning. ) 30 tablet 3 01/22/2015 at Unknown time  . clonazePAM (KLONOPIN) 0.5 MG tablet Take 0.5 mg by mouth at bedtime.   01/22/2015 at Unknown time  . doxazosin (CARDURA) 4 MG tablet Take 1.5 tablets (6 mg total) by mouth daily. (Patient taking differently: Take 4 mg by mouth 2 (two) times daily. ) 45 tablet 6 01/22/2015 at Unknown time  . ibuprofen (ADVIL,MOTRIN) 200 MG tablet Take 400 mg by mouth every 6 (six) hours as needed for mild pain or moderate pain.   Past Week at Unknown time  . losartan (COZAAR) 50 MG tablet Take  1 tablet (50 mg total) by mouth daily. 30 tablet 3 01/22/2015 at Unknown time  . metoprolol tartrate (LOPRESSOR) 25 MG tablet Take 2 tablets (50 mg total) by mouth 2 (two) times daily. (Patient taking differently: Take 25-50 mg by mouth 2 (two) times daily. Takes 1 tablet each morning and 2 tablets each evening.) 120 tablet 3 01/22/2015 at Unknown time  . promethazine (PHENERGAN) 12.5 MG tablet Take 1 tablet (12.5 mg total) by mouth every 6 (six) hours as needed for nausea or vomiting. 15 tablet 0 Past Week at Unknown time  . Testosterone Cypionate 200 MG/ML KIT Inject 400 mg into the muscle every 14 (fourteen) days.   Past Month at Unknown time  . traMADol (ULTRAM) 50 MG tablet Take 1-2 tablets (50-100 mg total) by mouth every 4 (four) hours as needed for moderate pain. 30 tablet 0 Past Week at Unknown time   Scheduled: . apixaban  5 mg Oral BID  . aspirin EC  81 mg Oral Daily  . clonazePAM  0.5 mg Oral QHS  . doxazosin  4 mg Oral Q12H  . metoprolol tartrate  25 mg Oral QID   Continuous: . amiodarone 30 mg/hr (01/27/15 0612)   [id number], ondansetron (ZOFRAN) IV, traMADol  Assesment: He was admitted with atrial fibrillation with rapid ventricular response. He is on amiodarone drip at this point and his heart rate is better but he is still in atrial fibrillation.  He has coronary artery disease and has had recent coronary artery bypass grafting 4. He denies any chest pain.  He has chronic diastolic heart failure and has no symptoms of that now.  He has hypertension and his blood pressure is well controlled continue medications for that  He has BPH but no symptoms now continue medications Active Problems:   CAD (coronary artery disease), native coronary artery   S/P CABG x 4   Essential hypertension   Atrial fibrillation and flutter   Palpitations   Hyperlipidemia   BPH (benign prostatic hyperplasia)   Chronic diastolic CHF (congestive heart failure)   Atrial fibrillation  with RVR    Plan: Continue amiodarone. Check basic metabolic profile this morning.    LOS: 4 days   Skye Plamondon L 01/27/2015, 8:03 AM

## 2015-01-27 NOTE — Care Management Utilization Note (Signed)
UR completed 

## 2015-01-27 NOTE — Progress Notes (Signed)
eLink Physician-Brief Progress Note Patient Name: Devin [name] DOB: Dec 19, 1945 MRN: [id number]   Date of Service  01/27/2015  HPI/Events of Note  BP = 86/40. SBP was as low as 72. LVEF 11/2014 = 60-65%.  eICU Interventions  Will fluid challenge with 0.9 NaCl 500 mL IV over 30 minutes.     Intervention Category Intermediate Interventions: Hypotension - evaluation and management  Maat Kafer Eugene 01/27/2015, 1:00 AM

## 2015-01-28 ENCOUNTER — Inpatient Hospital Stay (HOSPITAL_COMMUNITY): Payer: Medicare Other | Admitting: Anesthesiology

## 2015-01-28 ENCOUNTER — Encounter (HOSPITAL_COMMUNITY)
Admission: EM | Disposition: A | Discharge status: Home / Self Care | Payer: Self-pay | Source: Home / Self Care | Attending: Internal Medicine

## 2015-01-28 ENCOUNTER — Encounter (HOSPITAL_COMMUNITY): Payer: Self-pay | Admitting: Anesthesiology

## 2015-01-28 DIAGNOSIS — I4892 Unspecified atrial flutter: Secondary | ICD-10-CM

## 2015-01-28 HISTORY — PX: TEE WITHOUT CARDIOVERSION: SHX5443

## 2015-01-28 HISTORY — PX: CARDIOVERSION: SHX1299

## 2015-01-28 LAB — BASIC METABOLIC PANEL
ANION GAP: 5 (ref 5–15)
BUN: 16 mg/dL (ref 6–23)
CALCIUM: 8.4 mg/dL (ref 8.4–10.5)
CHLORIDE: 103 mmol/L (ref 96–112)
CO2: 30 mmol/L (ref 19–32)
Creatinine, Ser: 1.16 mg/dL (ref 0.50–1.35)
GFR calc Af Amer: 72 mL/min — ABNORMAL LOW (ref >90)
GFR calc non Af Amer: 62 mL/min — ABNORMAL LOW (ref >90)
Glucose, Bld: 114 mg/dL — ABNORMAL HIGH (ref 70–99)
POTASSIUM: 4.3 mmol/L (ref 3.5–5.1)
Sodium: 138 mmol/L (ref 135–145)

## 2015-01-28 SURGERY — ECHOCARDIOGRAM, TRANSESOPHAGEAL
Anesthesia: Monitor Anesthesia Care

## 2015-01-28 MED ORDER — BISACODYL 5 MG PO TBEC: 5.0000 mg | DELAYED_RELEASE_TABLET | Freq: Every day | ORAL | Status: DC | PRN

## 2015-01-28 MED ORDER — LIDOCAINE VISCOUS 2 % MT SOLN
OROMUCOSAL | Status: AC
  Filled 2015-01-28: qty 15

## 2015-01-28 MED ORDER — PROPOFOL INFUSION 10 MG/ML OPTIME
INTRAVENOUS | Status: DC | PRN
  Administered 2015-01-28: 150 ug/kg/min via INTRAVENOUS

## 2015-01-28 MED ORDER — SODIUM CHLORIDE 0.9 % IV SOLN
INTRAVENOUS | Status: DC | PRN
  Administered 2015-01-28: 10:00:00 via INTRAVENOUS

## 2015-01-28 MED ORDER — MIDAZOLAM HCL 2 MG/2ML IJ SOLN
INTRAMUSCULAR | Status: AC
  Filled 2015-01-28: qty 2

## 2015-01-28 MED ORDER — MIDAZOLAM HCL 5 MG/5ML IJ SOLN
INTRAMUSCULAR | Status: DC | PRN
Start: 2015-01-28 — End: 2015-01-28
  Administered 2015-01-28: 2 mg via INTRAVENOUS

## 2015-01-28 MED ORDER — RIVAROXABAN 20 MG PO TABS
20.0000 mg | ORAL_TABLET | Freq: Every day | ORAL | Status: DC
  Administered 2015-01-28 – 2015-01-29 (×2): 20 mg via ORAL
  Filled 2015-01-28 (×2): qty 1

## 2015-01-28 MED ORDER — AMIODARONE HCL 200 MG PO TABS
200.0000 mg | ORAL_TABLET | Freq: Two times a day (BID) | ORAL | Status: DC
  Administered 2015-01-28 – 2015-01-29 (×3): 200 mg via ORAL
  Filled 2015-01-28 (×3): qty 1

## 2015-01-28 MED ORDER — PROPOFOL 10 MG/ML IV BOLUS
INTRAVENOUS | Status: AC
  Filled 2015-01-28: qty 20

## 2015-01-28 MED ORDER — FENTANYL CITRATE 0.05 MG/ML IJ SOLN
INTRAMUSCULAR | Status: DC | PRN
  Administered 2015-01-28 (×4): 25 ug via INTRAVENOUS

## 2015-01-28 MED ORDER — PERFLUTREN LIPID MICROSPHERE
INTRAVENOUS | Status: AC
  Filled 2015-01-28: qty 10

## 2015-01-28 MED ORDER — FENTANYL CITRATE 0.05 MG/ML IJ SOLN
INTRAMUSCULAR | Status: AC
  Filled 2015-01-28: qty 2

## 2015-01-28 MED ORDER — DOCUSATE SODIUM 100 MG PO CAPS
100.0000 mg | ORAL_CAPSULE | Freq: Two times a day (BID) | ORAL | Status: DC
Start: 2015-01-28 — End: 2015-01-29
  Administered 2015-01-28 – 2015-01-29 (×3): 100 mg via ORAL
  Filled 2015-01-28 (×3): qty 1

## 2015-01-28 MED ORDER — PERFLUTREN LIPID MICROSPHERE
INTRAVENOUS | Status: DC | PRN
  Administered 2015-01-28: 2 mL via INTRAVENOUS

## 2015-01-28 NOTE — Progress Notes (Signed)
Subjective: He has no complaints except for constipation. He denies any chest pain.  Objective: Vital signs in last 24 hours: Temp:  [97.6 F (36.4 C)-98.5 F (36.9 C)] 98 F (36.7 C) (03/31 0753) Pulse Rate:  [39-134] 44 (03/31 0700) Resp:  [8-28] 15 (03/31 0700) BP: (72-138)/(42-86) 116/64 mmHg (03/31 0700) SpO2:  [90 %-100 %] 95 % (03/31 0700) Weight:  [120.5 kg (265 lb 10.5 oz)] 120.5 kg (265 lb 10.5 oz) (03/31 0500) Weight change: -0.3 kg (-10.6 oz) Last BM Date: 01/26/15  Intake/Output from previous day: 03/30 0701 - 03/31 0700 In: 1478 [P.O.:540; I.V.:938] Out: 1325 [Urine:1325]  PHYSICAL EXAM General appearance: alert, cooperative and no distress Resp: clear to auscultation bilaterally Cardio: irregularly irregular rhythm GI: soft, non-tender; bowel sounds normal; no masses,  no organomegaly Extremities: extremities normal, atraumatic, no cyanosis or edema  Lab Results:  Results for orders placed or performed during the hospital encounter of 01/23/15 (from the past 48 hour(s))  Basic metabolic panel     Status: Abnormal   Collection Time: 01/27/15  8:12 AM  Result Value Ref Range   Sodium 137 135 - 145 mmol/[name]   Potassium 4.2 3.5 - 5.1 mmol/[name]   Chloride 103 96 - 112 mmol/[name]   CO2 29 19 - 32 mmol/[name]   Glucose, Bld 107 (H) 70 - 99 mg/dL   BUN 15 6 - 23 mg/dL   Creatinine, Ser 1.08 0.50 - 1.35 mg/dL   Calcium 8.4 8.4 - 10.5 mg/dL   GFR calc non Af Amer 68 ([name]) >90 mL/min   GFR calc Af Amer 79 ([name]) >90 mL/min    Comment: (NOTE) The eGFR has been calculated using the CKD EPI equation. This calculation has not been validated in all clinical situations. eGFR's persistently <90 mL/min signify possible Chronic Kidney Disease.    Anion gap 5 5 - 15  Basic metabolic panel     Status: Abnormal   Collection Time: 01/28/15  7:50 AM  Result Value Ref Range   Sodium 138 135 - 145 mmol/[name]   Potassium 4.3 3.5 - 5.1 mmol/[name]   Chloride 103 96 - 112 mmol/[name]   CO2 30 19 - 32  mmol/[name]   Glucose, Bld 114 (H) 70 - 99 mg/dL   BUN 16 6 - 23 mg/dL   Creatinine, Ser 1.16 0.50 - 1.35 mg/dL   Calcium 8.4 8.4 - 10.5 mg/dL   GFR calc non Af Amer 62 ([name]) >90 mL/min   GFR calc Af Amer 72 ([name]) >90 mL/min    Comment: (NOTE) The eGFR has been calculated using the CKD EPI equation. This calculation has not been validated in all clinical situations. eGFR's persistently <90 mL/min signify possible Chronic Kidney Disease.    Anion gap 5 5 - 15    ABGS No results for input(s): PHART, PO2ART, TCO2, HCO3 in the last 72 hours.  Invalid input(s): PCO2 CULTURES Recent Results (from the past 240 hour(s))  MRSA PCR Screening     Status: None   Collection Time: 01/23/15 12:26 PM  Result Value Ref Range Status   MRSA by PCR NEGATIVE NEGATIVE Final    Comment:        The GeneXpert MRSA Assay (FDA approved for NASAL specimens only), is one component of a comprehensive MRSA colonization surveillance program. It is not intended to diagnose MRSA infection nor to guide or monitor treatment for MRSA infections.    Studies/Results: No results found.  Medications:  Prior to Admission:  Prescriptions prior to  admission  Medication Sig Dispense Refill Last Dose  . albuterol (PROVENTIL HFA;VENTOLIN HFA) 108 (90 BASE) MCG/ACT inhaler Inhale 1-2 puffs into the lungs every 6 (six) hours as needed for wheezing or shortness of breath.   unknown  . aspirin EC 325 MG EC tablet Take 1 tablet (325 mg total) by mouth daily. 30 tablet 0 01/22/2015 at Unknown time  . atorvastatin (LIPITOR) 80 MG tablet Take 1 tablet (80 mg total) by mouth daily at 6 PM. (Patient taking differently: Take 80 mg by mouth every morning. ) 30 tablet 3 01/22/2015 at Unknown time  . clonazePAM (KLONOPIN) 0.5 MG tablet Take 0.5 mg by mouth at bedtime.   01/22/2015 at Unknown time  . doxazosin (CARDURA) 4 MG tablet Take 1.5 tablets (6 mg total) by mouth daily. (Patient taking differently: Take 4 mg by mouth 2 (two) times  daily. ) 45 tablet 6 01/22/2015 at Unknown time  . ibuprofen (ADVIL,MOTRIN) 200 MG tablet Take 400 mg by mouth every 6 (six) hours as needed for mild pain or moderate pain.   Past Week at Unknown time  . losartan (COZAAR) 50 MG tablet Take 1 tablet (50 mg total) by mouth daily. 30 tablet 3 01/22/2015 at Unknown time  . metoprolol tartrate (LOPRESSOR) 25 MG tablet Take 2 tablets (50 mg total) by mouth 2 (two) times daily. (Patient taking differently: Take 25-50 mg by mouth 2 (two) times daily. Takes 1 tablet each morning and 2 tablets each evening.) 120 tablet 3 01/22/2015 at Unknown time  . promethazine (PHENERGAN) 12.5 MG tablet Take 1 tablet (12.5 mg total) by mouth every 6 (six) hours as needed for nausea or vomiting. 15 tablet 0 Past Week at Unknown time  . Testosterone Cypionate 200 MG/ML KIT Inject 400 mg into the muscle every 14 (fourteen) days.   Past Month at Unknown time  . traMADol (ULTRAM) 50 MG tablet Take 1-2 tablets (50-100 mg total) by mouth every 4 (four) hours as needed for moderate pain. 30 tablet 0 Past Week at Unknown time   Scheduled: . aspirin EC  81 mg Oral Daily  . clonazePAM  0.5 mg Oral QHS  . docusate sodium  100 mg Oral BID  . doxazosin  4 mg Oral Q12H  . rivaroxaban  20 mg Oral Daily   Continuous:  IFO:YDXAJOINOMVEH, bisacodyl, ondansetron (ZOFRAN) IV, traMADol  Assesment: He was admitted with atrial fibrillation and flutter. He has had recent coronary artery bypass grafting and he is doing pretty well as far as any chest discomfort. He is scheduled for TEE and possible cardioversion today. He is complaining of constipation. Active Problems:   CAD (coronary artery disease), native coronary artery   S/P CABG x 4   Essential hypertension   Atrial fibrillation and flutter   Palpitations   Hyperlipidemia   BPH (benign prostatic hyperplasia)   Chronic diastolic CHF (congestive heart failure)   Atrial fibrillation with RVR    Plan: TEE and possible cardioversion  today. Add stool softener and laxative as needed.    LOS: 5 days   Devin [name] 01/28/2015, 8:59 AM

## 2015-01-28 NOTE — Anesthesia Postprocedure Evaluation (Signed)
  Anesthesia Post-op Note  Patient: Devin [name]  Procedure(s) Performed: Procedure(s): TRANSESOPHAGEAL ECHOCARDIOGRAM (TEE) (N/A) CARDIOVERSION (N/A)  Patient Location: PACU  Anesthesia Type:MAC  Level of Consciousness: awake, alert , oriented and patient cooperative  Airway and Oxygen Therapy: Patient Spontanous Breathing and Patient connected to nasal cannula oxygen  Post-op Pain: none  Post-op Assessment: Post-op Vital signs reviewed, Patient's Cardiovascular Status Stable, Respiratory Function Stable, Patent Airway, No signs of Nausea or vomiting and Pain level controlled  Post-op Vital Signs: Reviewed and stable  Last Vitals:  Filed Vitals:   01/28/15 0930  BP:   Pulse: 65  Temp:   Resp: 17    Complications: No apparent anesthesia complications

## 2015-01-28 NOTE — Addendum Note (Signed)
Addendum  created 01/28/15 1104 by Mickel Baas, CRNA   Modules edited: Anesthesia Medication Administration

## 2015-01-28 NOTE — Progress Notes (Signed)
  Echocardiogram Echocardiogram Transesophageal has been performed.  [name] 01/28/2015, 11:00 AM

## 2015-01-28 NOTE — Anesthesia Preprocedure Evaluation (Signed)
Anesthesia Evaluation  Patient identified by MRN, date of birth, ID band Patient awake    Reviewed: Allergy & Precautions, NPO status , Patient's Chart, lab work & pertinent test results  Airway Mallampati: II  TM Distance: >3 FB Neck ROM: Full    Dental no notable dental hx.    Pulmonary sleep apnea ,  breath sounds clear to auscultation  Pulmonary exam normal       Cardiovascular hypertension, Pt. on medications + CAD, + Past MI, + CABG and +CHF + dysrhythmias Atrial Fibrillation Rhythm:Regular Rate:Normal     Neuro/Psych negative neurological ROS  negative psych ROS   GI/Hepatic negative GI ROS, Neg liver ROS, GERD-  Medicated and Controlled,  Endo/Other  negative endocrine ROS  Renal/GU negative Renal ROS  negative genitourinary   Musculoskeletal negative musculoskeletal ROS (+)   Abdominal   Peds negative pediatric ROS (+)  Hematology negative hematology ROS (+)   Anesthesia Other Findings   Reproductive/Obstetrics negative OB ROS                             Anesthesia Physical Anesthesia Plan  ASA: III  Anesthesia Plan: MAC   Post-op Pain Management:    Induction: Intravenous  Airway Management Planned: Simple Face Mask  Additional Equipment:   Intra-op Plan:   Post-operative Plan:   Informed Consent: I have reviewed the patients History and Physical, chart, labs and discussed the procedure including the risks, benefits and alternatives for the proposed anesthesia with the patient or authorized representative who has indicated his/her understanding and acceptance.     Plan Discussed with:   Anesthesia Plan Comments:         Anesthesia Quick Evaluation

## 2015-01-28 NOTE — Addendum Note (Signed)
Addendum  created 01/28/15 1501 by Mickel Baas, CRNA   Modules edited: Anesthesia Events, Anesthesia Medication Administration

## 2015-01-28 NOTE — Addendum Note (Signed)
Addendum  created 01/28/15 1200 by Mickel Baas, CRNA   Modules edited: Charges VN

## 2015-01-28 NOTE — Discharge Instructions (Signed)

## 2015-01-28 NOTE — Procedures (Signed)
Procedure: TEE/DCCV Physician: Dr Carlyle Dolly MD Indication Aflutter   Patient was brought to the procedure suite after appropriate consent was obtained. The oropharynx was anesthesized with 2% viscous lidocaine spray and cetacaine. Moderate sedation was achieved with the assistance of anesthesiology, please refer to there note for details. Cardiopulmonary monitoring was done throughout the procedure. A bite block was placed, the TEE prove was intubated into the esophagus without difficulty and several images were taken.  For complete results of TEE please refer to report. In general there was no evidence of intracardiac thrombus, echocontrast was used to enhanced visualization of the appendage. After TEE was completed, the patient was repositioned for DCCV with pads in the anterior and posterior position. He was succesfully coverted from aflutter to sinus rhythm with a 200J shock x 1. He was monitored in the PACU post-procedure.    Zandra Abts MD

## 2015-01-28 NOTE — Transfer of Care (Signed)
Immediate Anesthesia Transfer of Care Note  Patient: Devin [name]  Procedure(s) Performed: Procedure(s): TRANSESOPHAGEAL ECHOCARDIOGRAM (TEE) (N/A) CARDIOVERSION (N/A)  Patient Location: PACU  Anesthesia Type:MAC  Level of Consciousness: awake, alert , oriented and patient cooperative  Airway & Oxygen Therapy: Patient Spontanous Breathing and Patient connected to nasal cannula oxygen  Post-op Assessment: Report given to RN and Post -op Vital signs reviewed and stable  Post vital signs: Reviewed and stable  Last Vitals:  Filed Vitals:   01/28/15 0930  BP:   Pulse: 65  Temp:   Resp: 17    Complications: No apparent anesthesia complications

## 2015-01-28 NOTE — Anesthesia Procedure Notes (Signed)
Procedure Name: MAC Date/Time: 01/28/2015 10:02 AM Performed by: Andree Elk, AMY A Pre-anesthesia Checklist: Patient identified, Emergency Drugs available, Suction available, Patient being monitored and Timeout performed Oxygen Delivery Method: Simple face mask

## 2015-01-28 NOTE — Progress Notes (Signed)
Recovery time begun.

## 2015-01-28 NOTE — Progress Notes (Signed)
Patient ID: TAIGE HOUSMAN, male   DOB: 1946/07/15, 69 y.o.   MRN: [id number]   Subjective:    No complaints this AM  Objective:   Temp:  [97.6 F (36.4 C)-98.5 F (36.9 C)] 98 F (36.7 C) (03/31 0753) Pulse Rate:  [39-134] 44 (03/31 0700) Resp:  [8-28] 15 (03/31 0700) BP: (72-138)/(42-86) 116/64 mmHg (03/31 0700) SpO2:  [90 %-100 %] 95 % (03/31 0700) Weight:  [265 lb 10.5 oz (120.5 kg)] 265 lb 10.5 oz (120.5 kg) (03/31 0500) Last BM Date: 01/26/15  Filed Weights   01/26/15 0500 01/27/15 0400 01/28/15 0500  Weight: 264 lb 5.3 oz (119.9 kg) 266 lb 5.1 oz (120.8 kg) 265 lb 10.5 oz (120.5 kg)    Intake/Output Summary (Last 24 hours) at 01/28/15 0814 Last data filed at 01/28/15 0600  Gross per 24 hour  Intake 1477.99 ml  Output   1325 ml  Net 152.99 ml    Telemetry: aflutter variable rate  Exam:  General: NAD  Resp: CTAB  Cardiac: irreg, rate 100, no m/r/g, no JVD  GI: abdomen soft, NT, ND  MSK: no LE edema  Neuro: no focal deficits  Psych: appropriate affect  Lab Results:  Basic Metabolic Panel:  Recent Labs Lab 01/25/15 0523 01/26/15 0851 01/27/15 0812 01/28/15 0750  NA 134* 137 137 138  K 4.1 4.3 4.2 4.3  CL 102 105 103 103  CO2 26 26 29 30   GLUCOSE 115* 126* 107* 114*  BUN 19 19 15 16   CREATININE 1.10 1.05 1.08 1.16  CALCIUM 8.3* 8.4 8.4 8.4  MG 2.1  --   --   --     Liver Function Tests:  Recent Labs Lab 01/23/15 1012  AST 13  ALT 18  ALKPHOS 88  BILITOT 0.7  PROT 7.0  ALBUMIN 3.6    CBC:  Recent Labs Lab 01/23/15 1012 01/24/15 0425 01/26/15 0851  WBC 4.3 4.3 4.9  HGB 13.1 12.7* 13.5  HCT 40.3 40.2 41.1  MCV 95.5 95.5 94.9  PLT 204 161 154    Cardiac Enzymes:  Recent Labs Lab 01/23/15 1629 01/23/15 2253 01/24/15 0425  TROPONINI 0.05* 0.04* 0.04*    BNP:  Recent Labs  01/15/15 1454  PROBNP 525.90*    Coagulation: No results for input(s): INR in the last 168 hours.  ECG:   Medications:   Scheduled  Medications: . aspirin EC  81 mg Oral Daily  . clonazePAM  0.5 mg Oral QHS  . docusate sodium  100 mg Oral BID  . doxazosin  4 mg Oral Q12H  . metoprolol tartrate  25 mg Oral BID  . rivaroxaban  20 mg Oral Daily     Infusions: . amiodarone 30 mg/hr (01/27/15 2142)     PRN Medications:  acetaminophen, bisacodyl, ondansetron (ZOFRAN) IV, traMADol     Assessment/Plan    1. Aflutter - bp's have not tolerated more aggressive AV nodal agents. Has not converted on IV amio gtt alone. - plan for TEE/DCCV today - changed from eliquis to xarelto for cost concenrs   2. CAD - recent CABG, has has no chest pain       Carlyle Dolly, M.D.

## 2015-01-29 ENCOUNTER — Encounter (HOSPITAL_COMMUNITY): Payer: Self-pay | Admitting: Cardiology

## 2015-01-29 MED ORDER — AMIODARONE HCL 200 MG PO TABS: 200.0000 mg | ORAL_TABLET | Freq: Two times a day (BID) | ORAL | Status: DC

## 2015-01-29 MED ORDER — ASPIRIN 81 MG PO TBEC: 81.0000 mg | DELAYED_RELEASE_TABLET | Freq: Every day | ORAL | Status: DC

## 2015-01-29 MED ORDER — RIVAROXABAN 20 MG PO TABS: 20.0000 mg | ORAL_TABLET | Freq: Every day | ORAL | Status: DC

## 2015-01-29 NOTE — Care Management Utilization Note (Signed)
UR completed 

## 2015-01-29 NOTE — Discharge Summary (Signed)
Physician Discharge Summary  Patient ID: Devin [name] MRN: [id number] DOB/AGE: Jan 29, 1946 69 y.o. Primary Care Physician:FAGAN,ROY, MD Admit date: 01/23/2015 Discharge date: 01/29/2015    Discharge Diagnoses:   Active Problems:   CAD (coronary artery disease), native coronary artery   S/P CABG x 4   Essential hypertension   Atrial fibrillation and flutter   Palpitations   Hyperlipidemia   BPH (benign prostatic hyperplasia)   Chronic diastolic CHF (congestive heart failure)   Atrial fibrillation with RVR     Medication List    TAKE these medications        albuterol 108 (90 BASE) MCG/ACT inhaler  Commonly known as:  PROVENTIL HFA;VENTOLIN HFA  Inhale 1-2 puffs into the lungs every 6 (six) hours as needed for wheezing or shortness of breath.     amiodarone 200 MG tablet  Commonly known as:  PACERONE  Take 1 tablet (200 mg total) by mouth 2 (two) times daily. Take 1 twice a day for 2 weeks then 1 daily     aspirin 81 MG EC tablet  Take 1 tablet (81 mg total) by mouth daily.     atorvastatin 80 MG tablet  Commonly known as:  LIPITOR  Take 1 tablet (80 mg total) by mouth daily at 6 PM.     clonazePAM 0.5 MG tablet  Commonly known as:  KLONOPIN  Take 0.5 mg by mouth at bedtime.     doxazosin 4 MG tablet  Commonly known as:  CARDURA  Take 1.5 tablets (6 mg total) by mouth daily.     ibuprofen 200 MG tablet  Commonly known as:  ADVIL,MOTRIN  Take 400 mg by mouth every 6 (six) hours as needed for mild pain or moderate pain.     losartan 50 MG tablet  Commonly known as:  COZAAR  Take 1 tablet (50 mg total) by mouth daily.     metoprolol tartrate 25 MG tablet  Commonly known as:  LOPRESSOR  Take 2 tablets (50 mg total) by mouth 2 (two) times daily.     promethazine 12.5 MG tablet  Commonly known as:  PHENERGAN  Take 1 tablet (12.5 mg total) by mouth every 6 (six) hours as needed for nausea or vomiting.     rivaroxaban 20 MG Tabs tablet  Commonly known as:   XARELTO  Take 1 tablet (20 mg total) by mouth daily.     Testosterone Cypionate 200 MG/ML Kit  Inject 400 mg into the muscle every 14 (fourteen) days.     traMADol 50 MG tablet  Commonly known as:  ULTRAM  Take 1-2 tablets (50-100 mg total) by mouth every 4 (four) hours as needed for moderate pain.        Discharged Condition: Improved    Consults: Cardiology  Significant Diagnostic Studies: Dg Chest 1 View  01/23/2015   CLINICAL DATA:  Cardiac arrhythmia. Hypotension. Coronary artery bypass grafting 3.5 weeks prior  EXAM: CHEST  1 VIEW  COMPARISON:  January 15, 2015  FINDINGS: There is a persistent small left effusion with left base atelectasis. Lungs elsewhere clear. Heart is mildly enlarged with pulmonary vascularity within normal limits. No adenopathy. Patient is status post coronary artery bypass grafting. No pneumothorax. No bone lesions.  IMPRESSION: Persistent small left effusion with mild left base atelectasis. Lungs otherwise clear. Heart mildly enlarged with pulmonary vascularity within normal limits.   Electronically Signed   By: Lowella Grip III M.D.   On: 01/23/2015 10:40   Dg Chest 2  View  01/15/2015   CLINICAL DATA:  Anterior chest pain and shortness of breath for the past 2 weeks; history of CABG 2 weeks ago  EXAM: CHEST  2 VIEW  COMPARISON:  PA and lateral chest x-ray of January 01, 2015  FINDINGS: The lungs are well-expanded. The left pleural effusion has increased slightly in size and is now small to moderate. The pulmonary interstitial markings are mildly increased but not significantly changed from the previous study. Fluid in the right minor fissure has resolved. The cardiac silhouette is top-normal in size. The pulmonary vascularity is not engorged. There are 6 intact sternal wires. The trachea is midline. There is stable mild loss of height of approximately T8.  IMPRESSION: 1. There is no evidence of pneumonia nor pneumothorax. The pulmonary interstitial markings  remain increased but the pulmonary vascularity is more distinct today. 2. Slight interval increase in the size of the left pleural effusion.   Electronically Signed   By: David  Martinique   On: 01/15/2015 16:08   Dg Chest 2 View  01/01/2015   CLINICAL DATA:  Post CABG.  EXAM: CHEST  2 VIEW  COMPARISON:  12/31/2014  FINDINGS: Again noted is a right subclavian central line. Tip of the catheter is near the junction of the right innominate vein and right subclavian vein. Negative for pneumothorax. Heart size is mildly enlarged. Left chest tube has been removed. Surgical plate in the lower cervical spine. Mild blunting at the lung bases. Large bridging osteophytes in the lower thoracic spine. Epicardial wires are present.  IMPRESSION: Removal of chest tube without pneumothorax.  Basilar densities suggest tiny effusions and/or atelectasis.   Electronically Signed   By: Markus Daft M.D.   On: 01/01/2015 07:50   US Venous Img Lower Unilateral Right  01/09/2015   CLINICAL DATA:  Postop CABG (12/29/2014) with right greater saphenous vein harvesting, now with right lower extremity pain and swelling. Evaluate for DVT.  EXAM: RIGHT LOWER EXTREMITY VENOUS DOPPLER ULTRASOUND  TECHNIQUE: Gray-scale sonography with graded compression, as well as color Doppler and duplex ultrasound were performed to evaluate the lower extremity deep venous systems from the level of the common femoral vein and including the common femoral, femoral, profunda femoral, popliteal and calf veins including the posterior tibial, peroneal and gastrocnemius veins when visible. The superficial great saphenous vein was also interrogated. Spectral Doppler was utilized to evaluate flow at rest and with distal augmentation maneuvers in the common femoral, femoral and popliteal veins.  COMPARISON:  None.  FINDINGS: Contralateral Common Femoral Vein: Respiratory phasicity is normal and symmetric with the symptomatic side. No evidence of thrombus. Normal  compressibility.  Common Femoral Vein: No evidence of thrombus. Normal compressibility, respiratory phasicity and response to augmentation.  Saphenofemoral Junction: No evidence of thrombus. Normal compressibility and flow on color Doppler imaging.  Profunda Femoral Vein: No evidence of thrombus. Normal compressibility and flow on color Doppler imaging.  Femoral Vein: No evidence of thrombus. Normal compressibility, respiratory phasicity and response to augmentation.  Popliteal Vein: No evidence of thrombus. Normal compressibility, respiratory phasicity and response to augmentation.  Calf Veins: No evidence of thrombus. Normal compressibility and flow on color Doppler imaging.  Superficial Great Saphenous Vein: Surgically absent.  Venous Reflux:  None.  Other Findings: There is a minimal amount of serpiginous anechoic fluid within the medial thigh and calf at the location of the greater saphenous vein harvesting site with the collection at the medial side measuring approximately 3.2 x 0.9 cm (image 31) and  the collection within the calf measuring approximately 1.7 x 0.9 (image 38).  IMPRESSION: 1. No evidence of DVT within the right lower extremity. 2. Minimal amount of serpiginous anechoic fluid within both the medial thigh and calf at the location of the greater saphenous vein harvesting sites - nonspecific with differential considerations including hematoma, seroma and abscess. Clinical correlation is advised.   Electronically Signed   By: Sandi Mariscal M.D.   On: 01/09/2015 09:47   Dg Chest Port 1 View  12/31/2014   CLINICAL DATA:  Coronary artery bypass grafting. Postoperative radiograph.  EXAM: PORTABLE CHEST - 1 VIEW  COMPARISON:  12/30/2014.  FINDINGS: Support apparatus: Mediastinal drain has been removed. LEFT thoracostomy tube remains present. Monitoring leads project over the chest.  Cardiomediastinal Silhouette: Mildly enlarged. Size accentuated by low volumes.  Lungs: Basilar atelectasis. No edema or  consolidation. No pneumothorax.  Effusions:  Probable small LEFT pleural effusion.  Other:  None.  IMPRESSION: 1. Interval removal of mediastinal drain. LEFT thoracostomy tube remains present. 2. Low volumes with basilar atelectasis. 3. Expected appearance of the chest following CABG.   Electronically Signed   By: Dereck Ligas M.D.   On: 12/31/2014 07:27    Lab Results: Basic Metabolic Panel:  Recent Labs  01/27/15 0812 01/28/15 0750  NA 137 138  K 4.2 4.3  CL 103 103  CO2 29 30  GLUCOSE 107* 114*  BUN 15 16  CREATININE 1.08 1.16  CALCIUM 8.4 8.4   Liver Function Tests: No results for input(s): AST, ALT, ALKPHOS, BILITOT, PROT, ALBUMIN in the last 72 hours.   CBC:  Recent Labs  01/26/15 0851  WBC 4.9  HGB 13.5  HCT 41.1  MCV 94.9  PLT 154    Recent Results (from the past 240 hour(s))  MRSA PCR Screening     Status: None   Collection Time: 01/23/15 12:26 PM  Result Value Ref Range Status   MRSA by PCR NEGATIVE NEGATIVE Final    Comment:        The GeneXpert MRSA Assay (FDA approved for NASAL specimens only), is one component of a comprehensive MRSA colonization surveillance program. It is not intended to diagnose MRSA infection nor to guide or monitor treatment for MRSA infections.      Hospital Course: This is a 69 year old who developed increasing problems with a sensation that his heart was beating too fast and fatigue. He came to the emergency department and was noted to be in atrial fibrillation with rapid ventricular response. He was started on diltiazem. He did not convert with diltiazem and there was some problem with his blood pressure. He was then converted to amiodarone and was fully loaded with IV amiodarone. He did not convert with amiodarone. He underwent transesophageal echocardiogram on 01/28/2015 and had no clots seen. He then underwent cardioversion and converted to sinus rhythm. He feels well postprocedure. He has no complaints.  Discharge  Exam: Blood pressure 126/73, pulse 82, temperature 98 F (36.7 C), temperature source Oral, resp. rate 18, height 6' (1.829 m), weight 119.9 kg (264 lb 5.3 oz), SpO2 98 %. He is in sinus rhythm. His chest is clear. His heart is regular.  Disposition: Home. He will remain on anticoagulation with Xarelto. He will be on amiodarone. Follow with Dr. Willey Blade.     Signed: Zoriana Oats L   01/29/2015, 8:32 AM

## 2015-01-29 NOTE — Progress Notes (Signed)
Devin [name] was discharged from the unit to his home with his wife. Vital signs were stable. Discharge instructions were given and patient was able to teach back.  Instructions about his new medication xarelto were given and patient was able to teach back risk associated and bleeding precautions.  Reasons to call 911 were reviewed.  IV sites were clean, dry and intact upon removal.

## 2015-01-29 NOTE — Anesthesia Postprocedure Evaluation (Signed)
  Anesthesia Post-op Note  Patient: Devin [name]  Procedure(s) Performed: Procedure(s): TRANSESOPHAGEAL ECHOCARDIOGRAM (TEE) (N/A) CARDIOVERSION (N/A)  Patient Location: ICU  Anesthesia Type:MAC  Level of Consciousness: awake, alert , oriented and patient cooperative  Airway and Oxygen Therapy: Patient Spontanous Breathing  Post-op Pain: none  Post-op Assessment: Post-op Vital signs reviewed, Patient's Cardiovascular Status Stable, Respiratory Function Stable, Patent Airway, No signs of Nausea or vomiting and Pain level controlled  Post-op Vital Signs: Reviewed and stable  Last Vitals:  Filed Vitals:   01/29/15 0921  BP: 125/72  Pulse:   Temp:   Resp:     Complications: No apparent anesthesia complications; For discharge today

## 2015-01-29 NOTE — Progress Notes (Addendum)
Subjective: He feels well. He was cardioverted yesterday and has remained in sinus rhythm. He has some complaints of some chest wall pain on the left but no other complaints.  Objective: Vital signs in last 24 hours: Temp:  [98 F (36.7 C)-98.3 F (36.8 C)] 98 F (36.7 C) (04/01 0400) Pulse Rate:  [47-133] 85 (04/01 0500) Resp:  [12-24] 14 (04/01 0500) BP: (93-138)/(41-83) 93/44 mmHg (04/01 0500) SpO2:  [91 %-100 %] 97 % (04/01 0500) Weight:  [119.9 kg (264 lb 5.3 oz)] 119.9 kg (264 lb 5.3 oz) (04/01 0500) Weight change: -0.6 kg (-1 lb 5.2 oz) Last BM Date: 01/28/15  Intake/Output from previous day: 03/31 0701 - 04/01 0700 In: 100 [I.V.:100] Out: 475 [Urine:475]  PHYSICAL EXAM General appearance: alert, cooperative and no distress Resp: clear to auscultation bilaterally Cardio: regular rate and rhythm, S1, S2 normal, no murmur, click, rub or gallop GI: soft, non-tender; bowel sounds normal; no masses,  no organomegaly Extremities: extremities normal, atraumatic, no cyanosis or edema  Lab Results:  Results for orders placed or performed during the hospital encounter of 01/23/15 (from the past 48 hour(s))  Basic metabolic panel     Status: Abnormal   Collection Time: 01/27/15  8:12 AM  Result Value Ref Range   Sodium 137 135 - 145 mmol/L   Potassium 4.2 3.5 - 5.1 mmol/L   Chloride 103 96 - 112 mmol/L   CO2 29 19 - 32 mmol/L   Glucose, Bld 107 (H) 70 - 99 mg/dL   BUN 15 6 - 23 mg/dL   Creatinine, Ser 1.08 0.50 - 1.35 mg/dL   Calcium 8.4 8.4 - 10.5 mg/dL   GFR calc non Af Amer 68 (L) >90 mL/min   GFR calc Af Amer 79 (L) >90 mL/min    Comment: (NOTE) The eGFR has been calculated using the CKD EPI equation. This calculation has not been validated in all clinical situations. eGFR's persistently <90 mL/min signify possible Chronic Kidney Disease.    Anion gap 5 5 - 15  Basic metabolic panel     Status: Abnormal   Collection Time: 01/28/15  7:50 AM  Result Value Ref Range    Sodium 138 135 - 145 mmol/L   Potassium 4.3 3.5 - 5.1 mmol/L   Chloride 103 96 - 112 mmol/L   CO2 30 19 - 32 mmol/L   Glucose, Bld 114 (H) 70 - 99 mg/dL   BUN 16 6 - 23 mg/dL   Creatinine, Ser 1.16 0.50 - 1.35 mg/dL   Calcium 8.4 8.4 - 10.5 mg/dL   GFR calc non Af Amer 62 (L) >90 mL/min   GFR calc Af Amer 72 (L) >90 mL/min    Comment: (NOTE) The eGFR has been calculated using the CKD EPI equation. This calculation has not been validated in all clinical situations. eGFR's persistently <90 mL/min signify possible Chronic Kidney Disease.    Anion gap 5 5 - 15    ABGS No results for input(s): PHART, PO2ART, TCO2, HCO3 in the last 72 hours.  Invalid input(s): PCO2 CULTURES Recent Results (from the past 240 hour(s))  MRSA PCR Screening     Status: None   Collection Time: 01/23/15 12:26 PM  Result Value Ref Range Status   MRSA by PCR NEGATIVE NEGATIVE Final    Comment:        The GeneXpert MRSA Assay (FDA approved for NASAL specimens only), is one component of a comprehensive MRSA colonization surveillance program. It is not intended to diagnose  MRSA infection nor to guide or monitor treatment for MRSA infections.    Studies/Results: No results found.  Medications:  Prior to Admission:  Prescriptions prior to admission  Medication Sig Dispense Refill Last Dose  . albuterol (PROVENTIL HFA;VENTOLIN HFA) 108 (90 BASE) MCG/ACT inhaler Inhale 1-2 puffs into the lungs every 6 (six) hours as needed for wheezing or shortness of breath.   unknown  . aspirin EC 325 MG EC tablet Take 1 tablet (325 mg total) by mouth daily. 30 tablet 0 01/22/2015 at Unknown time  . atorvastatin (LIPITOR) 80 MG tablet Take 1 tablet (80 mg total) by mouth daily at 6 PM. (Patient taking differently: Take 80 mg by mouth every morning. ) 30 tablet 3 01/22/2015 at Unknown time  . clonazePAM (KLONOPIN) 0.5 MG tablet Take 0.5 mg by mouth at bedtime.   01/22/2015 at Unknown time  . doxazosin (CARDURA) 4 MG  tablet Take 1.5 tablets (6 mg total) by mouth daily. (Patient taking differently: Take 4 mg by mouth 2 (two) times daily. ) 45 tablet 6 01/22/2015 at Unknown time  . ibuprofen (ADVIL,MOTRIN) 200 MG tablet Take 400 mg by mouth every 6 (six) hours as needed for mild pain or moderate pain.   Past Week at Unknown time  . losartan (COZAAR) 50 MG tablet Take 1 tablet (50 mg total) by mouth daily. 30 tablet 3 01/22/2015 at Unknown time  . metoprolol tartrate (LOPRESSOR) 25 MG tablet Take 2 tablets (50 mg total) by mouth 2 (two) times daily. (Patient taking differently: Take 25-50 mg by mouth 2 (two) times daily. Takes 1 tablet each morning and 2 tablets each evening.) 120 tablet 3 01/22/2015 at Unknown time  . promethazine (PHENERGAN) 12.5 MG tablet Take 1 tablet (12.5 mg total) by mouth every 6 (six) hours as needed for nausea or vomiting. 15 tablet 0 Past Week at Unknown time  . Testosterone Cypionate 200 MG/ML KIT Inject 400 mg into the muscle every 14 (fourteen) days.   Past Month at Unknown time  . traMADol (ULTRAM) 50 MG tablet Take 1-2 tablets (50-100 mg total) by mouth every 4 (four) hours as needed for moderate pain. 30 tablet 0 Past Week at Unknown time   Scheduled: . amiodarone  200 mg Oral BID  . aspirin EC  81 mg Oral Daily  . clonazePAM  0.5 mg Oral QHS  . docusate sodium  100 mg Oral BID  . doxazosin  4 mg Oral Q12H  . rivaroxaban  20 mg Oral Daily   Continuous:  [id number], bisacodyl, ondansetron (ZOFRAN) IV, traMADol  Assesment: He came to the hospital with atrial fibrillation with rapid ventricular response. He has been cardioverted and is in sinus rhythm now. He has recent coronary artery bypass grafting 4. He has no complaints from that except for some incisional and chest wall pain. He has hypertension which is well controlled. He has chronic diastolic heart failure with no symptoms from that. Active Problems:   CAD (coronary artery disease), native coronary artery   S/P CABG  x 4   Essential hypertension   Atrial fibrillation and flutter   Palpitations   Hyperlipidemia   BPH (benign prostatic hyperplasia)   Chronic diastolic CHF (congestive heart failure)   Atrial fibrillation with RVR    Plan: I will prepare him for discharge. He wants to know how long he will remain on anticoagulation and I told him I'm not sure of the answer for that and will ask the cardiology consultants, the other question  for cardiology consultants is if he remains on amiodarone now that he is back in sinus rhythm    LOS: 6 days   Daneya Hartgrove L 01/29/2015, 7:25 AM

## 2015-01-29 NOTE — Addendum Note (Signed)
Addendum  created 01/29/15 2979 by Mickel Baas, CRNA   Modules edited: Notes Section   Notes Section:  File: [id number]

## 2015-01-29 NOTE — Progress Notes (Signed)
Patient ID: Devin [name], male   DOB: [birth year], 69 y.o.   MRN: [id number]      Subjective:    No complaints  Objective:   Temp:  [98 F (36.7 C)-98.3 F (36.8 C)] 98 F (36.7 C) (04/01 0400) Pulse Rate:  [47-133] 85 (04/01 0500) Resp:  [12-24] 14 (04/01 0500) BP: (93-138)/(41-83) 93/44 mmHg (04/01 0500) SpO2:  [91 %-100 %] 97 % (04/01 0500) Weight:  [264 lb 5.3 oz (119.9 kg)] 264 lb 5.3 oz (119.9 kg) (04/01 0500) Last BM Date: 01/28/15  Filed Weights   01/27/15 0400 01/28/15 0500 01/29/15 0500  Weight: 266 lb 5.1 oz (120.8 kg) 265 lb 10.5 oz (120.5 kg) 264 lb 5.3 oz (119.9 kg)    Intake/Output Summary (Last 24 hours) at 01/29/15 0826 Last data filed at 01/29/15 0500  Gross per 24 hour  Intake    100 ml  Output    475 ml  Net   -375 ml    Telemetry: NSR  Exam:  General: NAD  Resp: CTAB  Cardiac: RRR, no m/r/g, no JVD  GI: abdomen soft, NT, ND  MSK: no LE edema  Neuro: no focal deficits  Psych: appropriate affect  Lab Results:  Basic Metabolic Panel:  Recent Labs Lab 01/25/15 0523 01/26/15 0851 01/27/15 0812 01/28/15 0750  NA 134* 137 137 138  K 4.1 4.3 4.2 4.3  CL 102 105 103 103  CO2 26 26 29 30   GLUCOSE 115* 126* 107* 114*  BUN 19 19 15 16   CREATININE 1.10 1.05 1.08 1.16  CALCIUM 8.3* 8.4 8.4 8.4  MG 2.1  --   --   --     Liver Function Tests:  Recent Labs Lab 01/23/15 1012  AST 13  ALT 18  ALKPHOS 88  BILITOT 0.7  PROT 7.0  ALBUMIN 3.6    CBC:  Recent Labs Lab 01/23/15 1012 01/24/15 0425 01/26/15 0851  WBC 4.3 4.3 4.9  HGB 13.1 12.7* 13.5  HCT 40.3 40.2 41.1  MCV 95.5 95.5 94.9  PLT 204 161 154    Cardiac Enzymes:  Recent Labs Lab 01/23/15 1629 01/23/15 2253 01/24/15 0425  TROPONINI 0.05* 0.04* 0.04*    BNP:  Recent Labs  01/15/15 1454  PROBNP 525.90*    Coagulation: No results for input(s): INR in the last 168 hours.  ECG:   Medications:   Scheduled Medications: . amiodarone  200 mg Oral  BID  . aspirin EC  81 mg Oral Daily  . clonazePAM  0.5 mg Oral QHS  . docusate sodium  100 mg Oral BID  . doxazosin  4 mg Oral Q12H  . rivaroxaban  20 mg Oral Daily     Infusions:     PRN Medications:  acetaminophen, bisacodyl, ondansetron (ZOFRAN) IV, traMADol     Assessment/Plan     1. Aflutter - bp's have not tolerated more aggressive AV nodal agents. Did not converton IV amio gtt alone. - TEE/DCCV yesterday with succesful conversion to NSR. Started on oral amio 200mg  bid, will continue for 3 weeks then change to 200mg  daily (Change to 200mg  daily on 02/18/15) - continue xarelto   2. CAD - recent CABG, has has no chest pain - resume home cardiac meds except losartan   3. HTN - has had some low bp's at times while in hospital, would not restart back his home losartan at this time.    Plan for discharge today. We will set up f/u with NP Lawerence in  2-3 weeks and also supply a month of xarelto samples.      Carlyle Dolly, M.D.

## 2015-02-01 ENCOUNTER — Encounter (HOSPITAL_COMMUNITY)
Admission: RE | Admit: 2015-02-01 | Discharge: 2015-02-01 | Disposition: A | Discharge status: Home / Self Care | Payer: Medicare Other | Source: Ambulatory Visit | Attending: Cardiovascular Disease | Admitting: Cardiovascular Disease

## 2015-02-01 DIAGNOSIS — Z951 Presence of aortocoronary bypass graft: Secondary | ICD-10-CM | POA: Insufficient documentation

## 2015-02-01 DIAGNOSIS — I2510 Atherosclerotic heart disease of native coronary artery without angina pectoris: Secondary | ICD-10-CM | POA: Insufficient documentation

## 2015-02-01 DIAGNOSIS — E785 Hyperlipidemia, unspecified: Secondary | ICD-10-CM | POA: Diagnosis not present

## 2015-02-01 DIAGNOSIS — I10 Essential (primary) hypertension: Secondary | ICD-10-CM | POA: Diagnosis not present

## 2015-02-01 DIAGNOSIS — Z79899 Other long term (current) drug therapy: Secondary | ICD-10-CM | POA: Diagnosis not present

## 2015-02-02 ENCOUNTER — Other Ambulatory Visit: Payer: Self-pay | Admitting: Cardiothoracic Surgery

## 2015-02-02 DIAGNOSIS — Z951 Presence of aortocoronary bypass graft: Secondary | ICD-10-CM

## 2015-02-03 ENCOUNTER — Ambulatory Visit (INDEPENDENT_AMBULATORY_CARE_PROVIDER_SITE_OTHER): Payer: Self-pay | Admitting: Cardiothoracic Surgery

## 2015-02-03 ENCOUNTER — Encounter (HOSPITAL_COMMUNITY): Payer: Medicare Other

## 2015-02-03 ENCOUNTER — Ambulatory Visit
Admission: RE | Admit: 2015-02-03 | Discharge: 2015-02-03 | Disposition: A | Discharge status: Home / Self Care | Payer: Medicare Other | Source: Ambulatory Visit | Attending: Thoracic Surgery (Cardiothoracic Vascular Surgery) | Admitting: Thoracic Surgery (Cardiothoracic Vascular Surgery)

## 2015-02-03 ENCOUNTER — Encounter: Payer: Self-pay | Admitting: Cardiothoracic Surgery

## 2015-02-03 VITALS — BP 116/72 | HR 95 | Resp 18 | Ht 72.0 in | Wt 264.0 lb

## 2015-02-03 DIAGNOSIS — Z951 Presence of aortocoronary bypass graft: Secondary | ICD-10-CM

## 2015-02-03 DIAGNOSIS — R0602 Shortness of breath: Secondary | ICD-10-CM | POA: Diagnosis not present

## 2015-02-03 NOTE — Progress Notes (Signed)
PCP is [name], MD Referring Provider is Lorretta Harp, MD  Chief Complaint  Patient presents with  . Routine Post Op    f/u from surgery with CXR, s/p  Coronary artery bypass grafting x4 12/29/14.Marland Kitchenin cardiac rehab    HPI:the patient returns for one month followup after urgent CABG x4 after presenting with unstable angina and non-ST elevation MI. The patient did well following surgery maintaining sinus rhythm. However after 2-3 weeks at home he developed atrial fibrillation, [facility],xarelto, and was successfully cardioverted. 2-D echocardiogram performed prior to cardioversion demonstrated normal EF 50-and 60%. He is maintained sinus rhythm.  The patient describes symptoms of being unable to take a deep breath especially on the left side. Chest x-ray today is reported to have a moderate left pleural effusion. The x-ray images are not available on the [facility] system today. The patient is unable to take oral diuretics. Left thoracentesis would be indicated to improve his symptoms. The patient will need to stop taking his xarelto prior to thoracentesis for 48 hours. We will schedule the left thoracentesis on Monday, April 11 and he will stop the anticoagulant 48 hours previously.   Past Medical History  Diagnosis Date  . Essential hypertension   . Prostatic hypertrophy   . Melanoma of back   . Asbestosis   . Sleep apnea     Intolerant of CPAP  . GERD (gastroesophageal reflux disease)   . Arthritis   . 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    Past Surgical History  Procedure Laterality Date  . Anterior cervical decomp/discectomy fusion    . Shoulder open rotator cuff repair Left   . Knee arthroscopy Right   . Knee cartilage surgery Left   . Melanoma excision      "back"  . Left heart catheterization with coronary angiogram N/A 12/28/2014   Procedure: LEFT HEART CATHETERIZATION WITH CORONARY ANGIOGRAM;  Surgeon: Lorretta Harp, MD;  Location: [facility] CATH LAB;  Service: Cardiovascular;  Laterality: N/A;  . Coronary artery bypass graft N/A 12/29/2014    Procedure: CORONARY ARTERY BYPASS GRAFTING (CABG), ON PUMP, TIMES FOUR, USING LEFT INTERNAL MAMMARY ARTERY, RIGHT GREATER SAPHENOUS VEIN HARVESTED ENDOSCOPICALLY;  Surgeon: Ivin Poot, MD;  Location: [place];  Service: Open Heart Surgery;  Laterality: N/A;  . Tee without cardioversion N/A 12/29/2014    Procedure: TRANSESOPHAGEAL ECHOCARDIOGRAM (TEE);  Surgeon: Ivin Poot, MD;  Location: [place];  Service: Open Heart Surgery;  Laterality: N/A;  . Tee without cardioversion N/A 01/28/2015    Procedure: TRANSESOPHAGEAL ECHOCARDIOGRAM (TEE);  Surgeon: Arnoldo Lenis, MD;  Location: [facility];  Service: Endoscopy;  Laterality: N/A;  . Cardioversion N/A 01/28/2015    Procedure: CARDIOVERSION;  Surgeon: Arnoldo Lenis, MD;  Location: [facility];  Service: Endoscopy;  Laterality: N/A;    Family History  Problem Relation Age of Onset  . Stroke Mother   . Hypertension Mother   . Heart disease Sister     Died from complications of RHD  . Heart disease Brother     Heart failure related to EtOH and smoking  . Stomach cancer Sister     Social History History  Substance Use Topics  . Smoking status: Never Smoker   . Smokeless tobacco: Never Used  . Alcohol Use: 0.0 oz/week    0 Standard drinks  or equivalent per week     Comment: Occasional    Current Outpatient Prescriptions  Medication Sig Dispense Refill  . amiodarone (PACERONE) 200 MG tablet Take 1 tablet (200 mg total) by mouth 2 (two) times daily. Take 1 twice a day for 2 weeks then 1 daily 60 tablet 0  . aspirin EC 81 MG EC tablet Take 1 tablet (81 mg total) by mouth daily. 30 tablet 12  . atorvastatin (LIPITOR) 80 MG tablet Take 1 tablet (80 mg total) by mouth daily at 6 PM. (Patient taking differently: Take 80 mg by mouth every  morning. ) 30 tablet 3  . doxazosin (CARDURA) 4 MG tablet Take 4 mg by mouth 2 (two) times daily.    Marland Kitchen ibuprofen (ADVIL,MOTRIN) 200 MG tablet Take 400 mg by mouth every 6 (six) hours as needed for mild pain or moderate pain.    Marland Kitchen losartan (COZAAR) 50 MG tablet Take 1 tablet (50 mg total) by mouth daily. 30 tablet 3  . rivaroxaban (XARELTO) 20 MG TABS tablet Take 1 tablet (20 mg total) by mouth daily. 30 tablet 2  . Testosterone Cypionate 200 MG/ML KIT Inject 400 mg into the muscle every 14 (fourteen) days.     No current facility-administered medications for this visit.    Allergies  Allergen Reactions  . Lasix [Furosemide] Nausea Only    Review of Systems   Patient doing well and cardiac rehabilitation at [facility] Surgical incisions healing well No recurrent angina  BP 116/72 mmHg  Pulse 95  Resp 18  Ht 6' (1.829 m)  Wt 264 lb (119.75 kg)  BMI 35.80 kg/m2  SpO2 94% Physical Exam Alert and comfortable Sternal incision well-healed Leg incision well-healed Moderate decreased breath sounds on left Minimal pedal edema on right  Diagnostic Tests: Chest x-ray reported to show moderate left pleural effusion  Impression: Will perform thoracentesis on November 11 in the office after he is off his anticoagulant 40 hours.  Plan:return on April 11 for left thoracentesis   Len Childs, MD [facility] [phone]

## 2015-02-05 ENCOUNTER — Encounter (HOSPITAL_COMMUNITY)
Admission: RE | Admit: 2015-02-05 | Discharge: 2015-02-05 | Disposition: A | Discharge status: Home / Self Care | Payer: Medicare Other | Source: Ambulatory Visit | Attending: Cardiovascular Disease | Admitting: Cardiovascular Disease

## 2015-02-05 DIAGNOSIS — Z951 Presence of aortocoronary bypass graft: Secondary | ICD-10-CM | POA: Diagnosis not present

## 2015-02-05 DIAGNOSIS — I2510 Atherosclerotic heart disease of native coronary artery without angina pectoris: Secondary | ICD-10-CM | POA: Diagnosis not present

## 2015-02-08 ENCOUNTER — Ambulatory Visit (INDEPENDENT_AMBULATORY_CARE_PROVIDER_SITE_OTHER): Payer: Self-pay | Admitting: Cardiothoracic Surgery

## 2015-02-08 ENCOUNTER — Encounter: Payer: Self-pay | Admitting: Cardiothoracic Surgery

## 2015-02-08 ENCOUNTER — Ambulatory Visit
Admission: RE | Admit: 2015-02-08 | Discharge: 2015-02-08 | Disposition: A | Discharge status: Home / Self Care | Payer: Medicare Other | Source: Ambulatory Visit | Attending: Cardiothoracic Surgery | Admitting: Cardiothoracic Surgery

## 2015-02-08 ENCOUNTER — Encounter (HOSPITAL_COMMUNITY)
Admission: RE | Admit: 2015-02-08 | Discharge: 2015-02-08 | Disposition: A | Discharge status: Home / Self Care | Payer: Medicare Other | Source: Ambulatory Visit | Attending: Cardiovascular Disease | Admitting: Cardiovascular Disease

## 2015-02-08 ENCOUNTER — Other Ambulatory Visit: Payer: Self-pay | Admitting: *Deleted

## 2015-02-08 VITALS — BP 114/72 | HR 84 | Resp 16 | Ht 72.0 in | Wt 255.0 lb

## 2015-02-08 DIAGNOSIS — I2510 Atherosclerotic heart disease of native coronary artery without angina pectoris: Secondary | ICD-10-CM | POA: Diagnosis not present

## 2015-02-08 DIAGNOSIS — J90 Pleural effusion, not elsewhere classified: Secondary | ICD-10-CM

## 2015-02-08 DIAGNOSIS — Z951 Presence of aortocoronary bypass graft: Secondary | ICD-10-CM

## 2015-02-08 DIAGNOSIS — R0602 Shortness of breath: Secondary | ICD-10-CM | POA: Diagnosis not present

## 2015-02-08 DIAGNOSIS — J948 Other specified pleural conditions: Secondary | ICD-10-CM

## 2015-02-08 NOTE — Progress Notes (Signed)
PCP is [name], MD Referring Provider is Lorretta Harp, MD  Chief Complaint  Patient presents with  . Pleural Effusion    left ...here to have a left thoracentesis    HPI:DOE better inpast 3 days CXR shows persistent mod L effision- pt cannot tolerate lasix so L thoracentesis done- 1.2 L removed- bloody Will hold Xarelto another 3 days for sig bloody effusion   Past Medical History  Diagnosis Date  . Essential hypertension   . Prostatic hypertrophy   . Melanoma of back   . Asbestosis   . Sleep apnea     Intolerant of CPAP  . GERD (gastroesophageal reflux disease)   . Arthritis   . 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    Past Surgical History  Procedure Laterality Date  . Anterior cervical decomp/discectomy fusion    . Shoulder open rotator cuff repair Left   . Knee arthroscopy Right   . Knee cartilage surgery Left   . Melanoma excision      "back"  . Left heart catheterization with coronary angiogram N/A 12/28/2014    Procedure: LEFT HEART CATHETERIZATION WITH CORONARY ANGIOGRAM;  Surgeon: Lorretta Harp, MD;  Location: [facility] CATH LAB;  Service: Cardiovascular;  Laterality: N/A;  . Coronary artery bypass graft N/A 12/29/2014    Procedure: CORONARY ARTERY BYPASS GRAFTING (CABG), ON PUMP, TIMES FOUR, USING LEFT INTERNAL MAMMARY ARTERY, RIGHT GREATER SAPHENOUS VEIN HARVESTED ENDOSCOPICALLY;  Surgeon: Ivin Poot, MD;  Location: [place];  Service: Open Heart Surgery;  Laterality: N/A;  . Tee without cardioversion N/A 12/29/2014    Procedure: TRANSESOPHAGEAL ECHOCARDIOGRAM (TEE);  Surgeon: Ivin Poot, MD;  Location: [place];  Service: Open Heart Surgery;  Laterality: N/A;  . Tee without cardioversion N/A 01/28/2015    Procedure: TRANSESOPHAGEAL ECHOCARDIOGRAM (TEE);  Surgeon: Arnoldo Lenis, MD;  [facility];  Service: Endoscopy;  Laterality: N/A;  . Cardioversion N/A  01/28/2015    Procedure: CARDIOVERSION;  Surgeon: Arnoldo Lenis, MD;  Location: [facility];  Service: Endoscopy;  Laterality: N/A;    Family History  Problem Relation Age of Onset  . Stroke Mother   . Hypertension Mother   . Heart disease Sister     Died from complications of RHD  . Heart disease Brother     Heart failure related to EtOH and smoking  . Stomach cancer Sister     Social History History  Substance Use Topics  . Smoking status: Never Smoker   . Smokeless tobacco: Never Used  . Alcohol Use: 0.0 oz/week    0 Standard drinks or equivalent per week     Comment: Occasional    Current Outpatient Prescriptions  Medication Sig Dispense Refill  . amiodarone (PACERONE) 200 MG tablet Take 1 tablet (200 mg total) by mouth 2 (two) times daily. Take 1 twice a day for 2 weeks then 1 daily 60 tablet 0  . aspirin EC 81 MG EC tablet Take 1 tablet (81 mg total) by mouth daily. 30 tablet 12  . atorvastatin (LIPITOR) 80 MG tablet Take 1 tablet (80 mg total) by mouth daily at 6 PM. (Patient taking differently: Take 80 mg by mouth every morning. ) 30 tablet 3  . doxazosin (CARDURA) 4 MG tablet Take 4 mg by mouth 2 (two) times daily.    Marland Kitchen ibuprofen (ADVIL,MOTRIN) 200 MG tablet Take 400  mg by mouth every 6 (six) hours as needed for mild pain or moderate pain.    Marland Kitchen losartan (COZAAR) 50 MG tablet Take 1 tablet (50 mg total) by mouth daily. 30 tablet 3  . Testosterone Cypionate 200 MG/ML KIT Inject 400 mg into the muscle every 14 (fourteen) days.    . rivaroxaban (XARELTO) 20 MG TABS tablet Take 1 tablet (20 mg total) by mouth daily. (Patient not taking: Reported on 02/08/2015) 30 tablet 2   No current facility-administered medications for this visit.    Allergies  Allergen Reactions  . Lasix [Furosemide] Nausea Only    Review of Systems  Getting stronger in rehab  Ht 6' (1.829 m)  Wt 255 lb (115.667 kg)  BMI 34.58 kg/m2 Physical Exam   Diagnostic Tests: CXR reviewed with  patient  Impression:  Postop L pleural effusion- thoracentesis done in office w/o complication Plan: F/u CXR in 2 weeks  Len Childs, MD [facility] [phone]

## 2015-02-09 NOTE — Progress Notes (Signed)
Cardiac Rehabilitation Program Outcomes Report   Orientation:  01/14/15 Graduate Date:  tbd Discharge Date:  tbd # of sessions completed: 3  Cardiologist: Bronson Ing Family MD:  Homero Fellers Time:  1100  A.  Exercise Program:  Tolerates exercise @ 3.75 METS for 15 minutes and Walk Test Results:  Pre: 2.67 mets  B.  Mental Health:  Good mental attitude  C.  Education/Instruction/Skills  Knows THR for exercise  Uses Perceived Exertion Scale and/or Dyspnea Scale  D.  Nutrition/Weight Control/Body Composition:  Adherence to prescribed nutrition program: fair    E.  Blood Lipids    Lab Results  Component Value Date   CHOL 171 12/26/2014   HDL 31* 12/26/2014   LDLCALC 116* 12/26/2014   TRIG 120 12/26/2014   CHOLHDL 5.5 12/26/2014    F.  Lifestyle Changes:  Pt making positive life changes.   G.  Symptoms noted with exercise:  Asymptomatic  Report Completed By:  Stevphen Rochester RN   Comments:  This is patients first week progress note in Cardiac Rehab.

## 2015-02-09 NOTE — Progress Notes (Signed)
Patient referred to Cardiac Rehab by Dr. Bronson Ing due to CABG x4 (Z95.1)  Dr.Koneswaran is his cardiologist and Dr. Willey Blade is his PCP.  During orientation advised patient on arrival and appointment times what to wear, what to do before, during and after exercise.  Reviewed attendance and class policy.  Talked about inclement weather and class consultation policy. Patient is scheduled to start cardiac Rehab on February 01, 2015 at 1100. Patient was advised to come to class 5 minutes before class starts.  He was also given instructions on meeting with the dietician and attending the Family Structure classes. Pt is eager to get started.  Patient finished Pre walk test.

## 2015-02-10 ENCOUNTER — Encounter (HOSPITAL_COMMUNITY)
Admission: RE | Admit: 2015-02-10 | Discharge: 2015-02-10 | Disposition: A | Discharge status: Home / Self Care | Payer: Medicare Other | Source: Ambulatory Visit | Attending: Cardiovascular Disease | Admitting: Cardiovascular Disease

## 2015-02-10 DIAGNOSIS — I2510 Atherosclerotic heart disease of native coronary artery without angina pectoris: Secondary | ICD-10-CM | POA: Diagnosis not present

## 2015-02-10 DIAGNOSIS — Z951 Presence of aortocoronary bypass graft: Secondary | ICD-10-CM | POA: Diagnosis not present

## 2015-02-12 ENCOUNTER — Encounter (HOSPITAL_COMMUNITY)
Admission: RE | Admit: 2015-02-12 | Discharge: 2015-02-12 | Disposition: A | Discharge status: Home / Self Care | Payer: Medicare Other | Source: Ambulatory Visit | Attending: Cardiovascular Disease | Admitting: Cardiovascular Disease

## 2015-02-12 ENCOUNTER — Ambulatory Visit (INDEPENDENT_AMBULATORY_CARE_PROVIDER_SITE_OTHER): Payer: Medicare Other | Admitting: Adult Health

## 2015-02-12 ENCOUNTER — Encounter: Payer: Self-pay | Admitting: Adult Health

## 2015-02-12 VITALS — BP 118/64 | HR 85 | Ht 72.0 in | Wt 261.0 lb

## 2015-02-12 DIAGNOSIS — I480 Paroxysmal atrial fibrillation: Secondary | ICD-10-CM

## 2015-02-12 DIAGNOSIS — I25119 Atherosclerotic heart disease of native coronary artery with unspecified angina pectoris: Secondary | ICD-10-CM | POA: Diagnosis not present

## 2015-02-12 DIAGNOSIS — J90 Pleural effusion, not elsewhere classified: Secondary | ICD-10-CM | POA: Diagnosis not present

## 2015-02-12 DIAGNOSIS — I4891 Unspecified atrial fibrillation: Secondary | ICD-10-CM | POA: Diagnosis not present

## 2015-02-12 DIAGNOSIS — I2510 Atherosclerotic heart disease of native coronary artery without angina pectoris: Secondary | ICD-10-CM | POA: Diagnosis not present

## 2015-02-12 DIAGNOSIS — Z951 Presence of aortocoronary bypass graft: Secondary | ICD-10-CM | POA: Diagnosis not present

## 2015-02-12 NOTE — Progress Notes (Deleted)
Name: Devin [name]    DOB: 1946/03/15  Age: 69 y.o.  MR#: [id number]       PCP:  [name], MD      Insurance: Payor: MEDICARE / Plan: MEDICARE PART A AND B / Product Type: *No Product type* /   CC:    Chief Complaint  Patient presents with  . Coronary Artery Disease  . Atrial Fibrillation    VS Filed Vitals:   02/12/15 1457  BP: 118/64  Pulse: 85  Height: 6' (1.829 m)  Weight: 261 lb (118.389 kg)    Weights Current Weight  02/12/15 261 lb (118.389 kg)  02/08/15 255 lb (115.667 kg)  02/03/15 264 lb (119.75 kg)    Blood Pressure  BP Readings from Last 3 Encounters:  02/12/15 118/64  02/08/15 114/72  02/03/15 116/72     Admit date:  (Not on file) Last encounter with RMR:  01/15/2015   Allergy Lasix  Current Outpatient Prescriptions  Medication Sig Dispense Refill  . amiodarone (PACERONE) 200 MG tablet Take 1 tablet (200 mg total) by mouth 2 (two) times daily. Take 1 twice a day for 2 weeks then 1 daily 60 tablet 0  . aspirin EC 81 MG EC tablet Take 1 tablet (81 mg total) by mouth daily. 30 tablet 12  . atorvastatin (LIPITOR) 80 MG tablet Take 1 tablet (80 mg total) by mouth daily at 6 PM. (Patient taking differently: Take 80 mg by mouth every morning. ) 30 tablet 3  . doxazosin (CARDURA) 4 MG tablet Take 4 mg by mouth 2 (two) times daily.    Marland Kitchen ibuprofen (ADVIL,MOTRIN) 200 MG tablet Take 400 mg by mouth every 6 (six) hours as needed for mild pain or moderate pain.    Marland Kitchen losartan (COZAAR) 50 MG tablet Take 1 tablet (50 mg total) by mouth daily. 30 tablet 3  . Testosterone Cypionate 200 MG/ML KIT Inject 400 mg into the muscle every 14 (fourteen) days.    . rivaroxaban (XARELTO) 20 MG TABS tablet Take 1 tablet (20 mg total) by mouth daily. (Patient not taking: Reported on 02/12/2015) 30 tablet 2   No current facility-administered medications for this visit.    Discontinued Meds:   There are no discontinued medications.  Patient Active Problem List   Diagnosis Date Noted   . Atrial fibrillation and flutter 01/23/2015  . Palpitations 01/23/2015  . Hyperlipidemia 01/23/2015  . BPH (benign prostatic hyperplasia) 01/23/2015  . Chronic diastolic CHF (congestive heart failure) 01/23/2015  . Atrial fibrillation with RVR 01/23/2015  . Obstructive sleep apnea 01/18/2015  . Essential hypertension 01/18/2015  . Leg edema 01/18/2015  . 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 CAD (coronary artery disease), native coronary artery 12/26/2014    LABS    Component Value Date/Time   NA 138 01/28/2015 0750   NA 137 01/27/2015 0812   NA 137 01/26/2015 0851   K 4.3 01/28/2015 0750   K 4.2 01/27/2015 0812   K 4.3 01/26/2015 0851   CL 103 01/28/2015 0750   CL 103 01/27/2015 0812   CL 105 01/26/2015 0851   CO2 30 01/28/2015 0750   CO2 29 01/27/2015 0812   CO2 26 01/26/2015 0851   GLUCOSE 114* 01/28/2015 0750   GLUCOSE 107* 01/27/2015 0812   GLUCOSE 126* 01/26/2015 0851   BUN 16 01/28/2015 0750   BUN 15 01/27/2015 0812   BUN 19 01/26/2015 0851   CREATININE 1.16 01/28/2015 0750   CREATININE 1.08 01/27/2015 0812   CREATININE  1.05 01/26/2015 0851   CREATININE 0.98 01/15/2015 1454   CALCIUM 8.4 01/28/2015 0750   CALCIUM 8.4 01/27/2015 0812   CALCIUM 8.4 01/26/2015 0851   GFRNONAA 62* 01/28/2015 0750   GFRNONAA 68* 01/27/2015 0812   GFRNONAA 70* 01/26/2015 0851   GFRAA 72* 01/28/2015 0750   GFRAA 79* 01/27/2015 0812   GFRAA 82* 01/26/2015 0851   CMP     Component Value Date/Time   NA 138 01/28/2015 0750   K 4.3 01/28/2015 0750   CL 103 01/28/2015 0750   CO2 30 01/28/2015 0750   GLUCOSE 114* 01/28/2015 0750   BUN 16 01/28/2015 0750   CREATININE 1.16 01/28/2015 0750   CREATININE 0.98 01/15/2015 1454   CALCIUM 8.4 01/28/2015 0750   PROT 7.0 01/23/2015 1012   ALBUMIN 3.6 01/23/2015 1012   AST 13 01/23/2015 1012   ALT 18 01/23/2015 1012   ALKPHOS 88 01/23/2015 1012   BILITOT 0.7 01/23/2015 1012   GFRNONAA 62* 01/28/2015 0750   GFRAA 72* 01/28/2015 0750        Component Value Date/Time   WBC 4.9 01/26/2015 0851   WBC 4.3 01/24/2015 0425   WBC 4.3 01/23/2015 1012   HGB 13.5 01/26/2015 0851   HGB 12.7* 01/24/2015 0425   HGB 13.1 01/23/2015 1012   HCT 41.1 01/26/2015 0851   HCT 40.2 01/24/2015 0425   HCT 40.3 01/23/2015 1012   MCV 94.9 01/26/2015 0851   MCV 95.5 01/24/2015 0425   MCV 95.5 01/23/2015 1012    Lipid Panel     Component Value Date/Time   CHOL 171 12/26/2014 0245   TRIG 120 12/26/2014 0245   HDL 31* 12/26/2014 0245   CHOLHDL 5.5 12/26/2014 0245   VLDL 24 12/26/2014 0245   LDLCALC 116* 12/26/2014 0245    ABG    Component Value Date/Time   PHART 7.327* 12/30/2014 0437   PCO2ART 44.8 12/30/2014 0437   PO2ART 72.0* 12/30/2014 0437   HCO3 23.5 12/30/2014 0437   TCO2 23 12/30/2014 1837   ACIDBASEDEF 3.0* 12/30/2014 0437   O2SAT 83.8 12/30/2014 0442     Lab Results  Component Value Date   TSH 1.608 01/23/2015   BNP (last 3 results) No results for input(s): BNP in the last 8760 hours.  ProBNP (last 3 results)  Recent Labs  01/15/15 1454  PROBNP 525.90*    Cardiac Panel (last 3 results) No results for input(s): CKTOTAL, CKMB, TROPONINI, RELINDX in the last 72 hours.  Iron/TIBC/Ferritin/ %Sat No results found for: IRON, TIBC, FERRITIN, IRONPCTSAT   EKG Orders placed or performed during the hospital encounter of 01/23/15  . EKG 12-Lead  . EKG 12-Lead  . EKG 12-Lead  . EKG 12-Lead  . EKG  . EKG 12-Lead  . EKG 12-Lead  . EKG 12-Lead  . EKG 12-Lead  . EKG 12-Lead  . EKG 12-Lead  . EKG 12-Lead  . EKG 12-Lead     Prior Assessment and Plan Problem List as of 02/12/2015      Cardiovascular and Mediastinum   CAD (coronary artery disease), native coronary artery   Essential hypertension   Atrial fibrillation and flutter   Chronic diastolic CHF (congestive heart failure)   Atrial fibrillation with RVR     Respiratory   Obstructive sleep apnea     Genitourinary   BPH (benign prostatic hyperplasia)      Other   S/P CABG x 4   Leg edema   Palpitations   Hyperlipidemia  Imaging: Dg Chest 1 View  01/23/2015   CLINICAL DATA:  Cardiac arrhythmia. Hypotension. Coronary artery bypass grafting 3.5 weeks prior  EXAM: CHEST  1 VIEW  COMPARISON:  January 15, 2015  FINDINGS: There is a persistent small left effusion with left base atelectasis. Lungs elsewhere clear. Heart is mildly enlarged with pulmonary vascularity within normal limits. No adenopathy. Patient is status post coronary artery bypass grafting. No pneumothorax. No bone lesions.  IMPRESSION: Persistent small left effusion with mild left base atelectasis. Lungs otherwise clear. Heart mildly enlarged with pulmonary vascularity within normal limits.   Electronically Signed   By: Lowella Grip III M.D.   On: 01/23/2015 10:40   Dg Chest 2 View  02/08/2015   CLINICAL DATA:  Pleural effusion, left side. No shortness of breath. History of CABG 12/29/2014. Follow-up is not last week showed left pleural effusion. History of hypertension. Nonsmoker.  EXAM: CHEST  2 VIEW  COMPARISON:  02/03/2015  FINDINGS: Status post median sternotomy and CABG. There is persistent left-sided pleural effusion and associated basilar opacity, unchanged in appearance. Small right pleural effusion is also noted. There is no pulmonary edema or pneumothorax.  IMPRESSION: Stable bilateral pleural effusions, left greater than right.   Electronically Signed   By: Nolon Nations M.D.   On: 02/08/2015 14:58   Dg Chest 2 View  02/03/2015   CLINICAL DATA:  Status post CABG 5 weeks ago, cardioversion 1 week ago, shortness of breath.  EXAM: CHEST  2 VIEW  COMPARISON:  Chest x-ray of January 23, 2015  FINDINGS: There has been interval increase in the volume of the pleural effusions. There is a moderate-sized effusion now on the left with small effusion on the right. There is no mediastinal shift. The cardiac silhouette is normal in size. The pulmonary vascularity is not engorged.  There are 6 intact sternal wires. The mediastinum is normal in width.  IMPRESSION: Interval increase in the size of the pleural effusions especially on the left where the effusion is now moderate in volume. There is no pulmonary edema.   Electronically Signed   By: David  Martinique   On: 02/03/2015 09:00   Dg Chest 2 View  01/15/2015   CLINICAL DATA:  Anterior chest pain and shortness of breath for the past 2 weeks; history of CABG 2 weeks ago  EXAM: CHEST  2 VIEW  COMPARISON:  PA and lateral chest x-ray of January 01, 2015  FINDINGS: The lungs are well-expanded. The left pleural effusion has increased slightly in size and is now small to moderate. The pulmonary interstitial markings are mildly increased but not significantly changed from the previous study. Fluid in the right minor fissure has resolved. The cardiac silhouette is top-normal in size. The pulmonary vascularity is not engorged. There are 6 intact sternal wires. The trachea is midline. There is stable mild loss of height of approximately T8.  IMPRESSION: 1. There is no evidence of pneumonia nor pneumothorax. The pulmonary interstitial markings remain increased but the pulmonary vascularity is more distinct today. 2. Slight interval increase in the size of the left pleural effusion.   Electronically Signed   By: David  Martinique   On: 01/15/2015 16:08

## 2015-02-12 NOTE — Patient Instructions (Signed)
Your physician recommends that you schedule a follow-up appointment in: 1 month with Kathryn Lawrence, NP  Your physician recommends that you continue on your current medications as directed. Please refer to the Current Medication list given to you today.  Thank you for choosing [facility]!    

## 2015-02-12 NOTE — Progress Notes (Signed)
Cardiology Office Note   Date:  02/12/2015   ID:  ARMANY MANO, DOB 01/03/1946, MRN [id number]  PCP:  [name], MD  Cardiologist:  McDowell/Buffy Ehler Purcell Nails, NP   Chief Complaint  Patient presents with  . Coronary Artery Disease  . Atrial Fibrillation      History of Present Illness: Devin [name] is a 69 y.o. male who presents for ongoing assessment and management of CAD, history of CABG x4, hypertension, atrial fibrillation, and chronic diastolic heart failure.  The patient was recently discharged from the hospital after admission for A. Fib RVR.    He was started on diltiazem, did not concur with diltiazem due to hypotension, and he was converted amiodarone.  There is no change in his cardiac rhythm, and therefore, he underwent a TEE cardioversion.  He converted to normal sinus rhythm.  He was sent home on amiodarone 200 mg twice a day on 01/29/2015.  He is here for post hospitalization follow up.  Is being seen last he has had a thoracentesis by cardiac surgery, removing 1 L from his lower left lung.  He is feeling better, breathing better, but still has some soreness and a pulling feeling from his recent sternotomy from CABG.  He is due to restart Xarelto this weekend as prescribed by surgery.he is without complaint otherwise.  Past Medical History  Diagnosis Date  . Essential hypertension   . Prostatic hypertrophy   . Melanoma of back   . Asbestosis   . Sleep apnea     Intolerant of CPAP  . GERD (gastroesophageal reflux disease)   . Arthritis   . 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    Past Surgical History  Procedure Laterality Date  . Anterior cervical decomp/discectomy fusion    . Shoulder open rotator cuff repair Left   . Knee arthroscopy Right   . Knee cartilage surgery Left   . Melanoma excision      "back"  . Left heart catheterization with coronary angiogram N/A  12/28/2014    Procedure: LEFT HEART CATHETERIZATION WITH CORONARY ANGIOGRAM;  Surgeon: Lorretta Harp, MD;  Location: [facility] CATH LAB;  Service: Cardiovascular;  Laterality: N/A;  . Coronary artery bypass graft N/A 12/29/2014    Procedure: CORONARY ARTERY BYPASS GRAFTING (CABG), ON PUMP, TIMES FOUR, USING LEFT INTERNAL MAMMARY ARTERY, RIGHT GREATER SAPHENOUS VEIN HARVESTED ENDOSCOPICALLY;  Surgeon: Ivin Poot, MD;  Location: [facility];  Service: Open Heart Surgery;  Laterality: N/A;  . Tee without cardioversion N/A 12/29/2014    Procedure: TRANSESOPHAGEAL ECHOCARDIOGRAM (TEE);  Surgeon: Ivin Poot, MD;  Location: [facility];  Service: Open Heart Surgery;  Laterality: N/A;  . Tee without cardioversion N/A 01/28/2015    Procedure: TRANSESOPHAGEAL ECHOCARDIOGRAM (TEE);  Surgeon: Arnoldo Lenis, MD;  Location: [facility];  Service: Endoscopy;  Laterality: N/A;  . Cardioversion N/A 01/28/2015    Procedure: CARDIOVERSION;  Surgeon: Arnoldo Lenis, MD;  Location: [facility];  Service: Endoscopy;  Laterality: N/A;     Current Outpatient Prescriptions  Medication Sig Dispense Refill  . amiodarone (PACERONE) 200 MG tablet Take 1 tablet (200 mg total) by mouth 2 (two) times daily. Take 1 twice a day for 2 weeks then 1 daily 60 tablet 0  . aspirin EC 81 MG EC tablet Take 1 tablet (81 mg total) by mouth daily. 30 tablet 12  . atorvastatin (  LIPITOR) 80 MG tablet Take 1 tablet (80 mg total) by mouth daily at 6 PM. (Patient taking differently: Take 80 mg by mouth every morning. ) 30 tablet 3  . doxazosin (CARDURA) 4 MG tablet Take 4 mg by mouth 2 (two) times daily.    Marland Kitchen ibuprofen (ADVIL,MOTRIN) 200 MG tablet Take 400 mg by mouth every 6 (six) hours as needed for mild pain or moderate pain.    Marland Kitchen losartan (COZAAR) 50 MG tablet Take 1 tablet (50 mg total) by mouth daily. 30 tablet 3  . rivaroxaban (XARELTO) 20 MG TABS tablet Take 1 tablet (20 mg total) by mouth daily. (Patient not taking: Reported on 02/08/2015) 30 tablet 2   . Testosterone Cypionate 200 MG/ML KIT Inject 400 mg into the muscle every 14 (fourteen) days.     No current facility-administered medications for this visit.    Allergies:   Lasix    Social History:  The patient  reports that he has never smoked. He has never used smokeless tobacco. He reports that he drinks alcohol. He reports that he does not use illicit drugs.   Family History:  The patient's family history includes Heart disease in his brother and sister; Hypertension in his mother; Stomach cancer in his sister; Stroke in his mother.    ROS: .   All other systems are reviewed and negative.Unless otherwise mentioned in H&P above.   PHYSICAL EXAM: VS:  There were no vitals taken for this visit. , BMI There is no weight on file to calculate BMI. GEN: Well nourished, well developed, in no acute distress HEENT: normal Neck: no JVD, carotid bruits, or masses Cardiac: RRR; no murmurs, rubs, or gallops,no edema  Respiratory:  clear to auscultation bilaterally, normal work of breathing GI: soft, nontender, nondistended, + BS MS: no deformity or atrophy Skin: warm and dry, no rash Neuro:  Strength and sensation are intact Psych: euthymic mood, full affect  Recent Labs: 01/15/2015: Pro B Natriuretic peptide (BNP) 525.90* 01/23/2015: ALT 18; TSH 1.608 01/25/2015: Magnesium 2.1 01/26/2015: Hemoglobin 13.5; Platelets 154 01/28/2015: BUN 16; Creatinine 1.16; Potassium 4.3; Sodium 138    Lipid Panel    Component Value Date/Time   CHOL 171 12/26/2014 0245   TRIG 120 12/26/2014 0245   HDL 31* 12/26/2014 0245   CHOLHDL 5.5 12/26/2014 0245   VLDL 24 12/26/2014 0245   LDLCALC 116* 12/26/2014 0245      Wt Readings from Last 3 Encounters:  02/08/15 255 lb (115.667 kg)  02/03/15 264 lb (119.75 kg)  01/18/15 268 lb 9.6 oz (121.836 kg)      Other studies Reviewed: Additional studies/ records that were reviewed today include: none Review of the above records  demonstrates:  ASSESSMENT AND PLAN:  1. CAD: S/P CABG 3/16: LIMA to LAD, SVG to OM1, SVG to OM2, SVG to PDA.  He continues down some soreness postoperatively.  He continues to have some pulling and pressure at the sternal site.  We will continue him on his current medication regimen.  No changes.  We will see him again in a month.  He is due to start cardiac rehabilitation in the next week or two.  He is advised to followup with them and encouraged to complete the program  2.Atrial fib with RVR:he continues on amiodarone, as do to change to 200 mg daily within the next couple of days.  He has stopped taking anticoagulation, but is due to restart in the next few days as well.  Post thoracentesis.  We will see him again in one month  3. Left lung thoracentesis: Removal of 1 Lwith improved breathing status.   Current medicines are reviewed at length with the patient today.    Labs/ tests ordered today include: none No orders of the defined types were placed in this encounter.     Disposition:   FU with  1 month  Signed, Jory Sims, NP  02/12/2015 7:32 AM    [facility] [address], [place], [place] [address] Phone: [phone]; Fax: [phone]

## 2015-02-15 ENCOUNTER — Encounter (HOSPITAL_COMMUNITY)
Admission: RE | Admit: 2015-02-15 | Discharge: 2015-02-15 | Disposition: A | Discharge status: Home / Self Care | Payer: Medicare Other | Source: Ambulatory Visit | Attending: Cardiovascular Disease | Admitting: Cardiovascular Disease

## 2015-02-15 ENCOUNTER — Ambulatory Visit: Payer: Medicare Other | Admitting: Cardiothoracic Surgery

## 2015-02-15 DIAGNOSIS — I2510 Atherosclerotic heart disease of native coronary artery without angina pectoris: Secondary | ICD-10-CM | POA: Diagnosis not present

## 2015-02-15 DIAGNOSIS — Z951 Presence of aortocoronary bypass graft: Secondary | ICD-10-CM | POA: Diagnosis not present

## 2015-02-15 DIAGNOSIS — N529 Male erectile dysfunction, unspecified: Secondary | ICD-10-CM | POA: Diagnosis not present

## 2015-02-17 ENCOUNTER — Encounter (HOSPITAL_COMMUNITY)
Admission: RE | Admit: 2015-02-17 | Discharge: 2015-02-17 | Disposition: A | Discharge status: Home / Self Care | Payer: Medicare Other | Source: Ambulatory Visit | Attending: Cardiovascular Disease | Admitting: Cardiovascular Disease

## 2015-02-17 DIAGNOSIS — I2510 Atherosclerotic heart disease of native coronary artery without angina pectoris: Secondary | ICD-10-CM | POA: Diagnosis not present

## 2015-02-17 DIAGNOSIS — Z951 Presence of aortocoronary bypass graft: Secondary | ICD-10-CM | POA: Diagnosis not present

## 2015-02-19 ENCOUNTER — Encounter (HOSPITAL_COMMUNITY)
Admission: RE | Admit: 2015-02-19 | Discharge: 2015-02-19 | Disposition: A | Discharge status: Home / Self Care | Payer: Medicare Other | Source: Ambulatory Visit | Attending: Cardiovascular Disease | Admitting: Cardiovascular Disease

## 2015-02-19 DIAGNOSIS — Z951 Presence of aortocoronary bypass graft: Secondary | ICD-10-CM | POA: Diagnosis not present

## 2015-02-19 DIAGNOSIS — I2510 Atherosclerotic heart disease of native coronary artery without angina pectoris: Secondary | ICD-10-CM | POA: Diagnosis not present

## 2015-02-22 ENCOUNTER — Encounter (HOSPITAL_COMMUNITY)
Admission: RE | Admit: 2015-02-22 | Discharge: 2015-02-22 | Disposition: A | Discharge status: Home / Self Care | Payer: Medicare Other | Source: Ambulatory Visit | Attending: Cardiovascular Disease | Admitting: Cardiovascular Disease

## 2015-02-22 DIAGNOSIS — I2510 Atherosclerotic heart disease of native coronary artery without angina pectoris: Secondary | ICD-10-CM | POA: Diagnosis not present

## 2015-02-22 DIAGNOSIS — Z951 Presence of aortocoronary bypass graft: Secondary | ICD-10-CM | POA: Diagnosis not present

## 2015-02-23 ENCOUNTER — Other Ambulatory Visit: Payer: Self-pay | Admitting: Cardiothoracic Surgery

## 2015-02-23 DIAGNOSIS — Z951 Presence of aortocoronary bypass graft: Secondary | ICD-10-CM

## 2015-02-24 ENCOUNTER — Ambulatory Visit (INDEPENDENT_AMBULATORY_CARE_PROVIDER_SITE_OTHER): Payer: Medicare Other | Admitting: Cardiology

## 2015-02-24 ENCOUNTER — Ambulatory Visit
Admission: RE | Admit: 2015-02-24 | Discharge: 2015-02-24 | Disposition: A | Discharge status: Home / Self Care | Payer: Medicare Other | Source: Ambulatory Visit | Attending: Cardiothoracic Surgery | Admitting: Cardiothoracic Surgery

## 2015-02-24 ENCOUNTER — Encounter: Payer: Self-pay | Admitting: Cardiology

## 2015-02-24 ENCOUNTER — Encounter: Payer: Self-pay | Admitting: Cardiothoracic Surgery

## 2015-02-24 ENCOUNTER — Ambulatory Visit (INDEPENDENT_AMBULATORY_CARE_PROVIDER_SITE_OTHER): Payer: Self-pay | Admitting: Cardiothoracic Surgery

## 2015-02-24 ENCOUNTER — Encounter (HOSPITAL_COMMUNITY)
Admission: RE | Admit: 2015-02-24 | Discharge: 2015-02-24 | Disposition: A | Discharge status: Home / Self Care | Payer: Medicare Other | Source: Ambulatory Visit | Attending: Cardiovascular Disease | Admitting: Cardiovascular Disease

## 2015-02-24 VITALS — BP 106/65 | HR 82 | Resp 20 | Ht 72.0 in | Wt 259.0 lb

## 2015-02-24 VITALS — BP 104/68 | HR 82 | Ht 72.0 in | Wt 259.0 lb

## 2015-02-24 DIAGNOSIS — I10 Essential (primary) hypertension: Secondary | ICD-10-CM | POA: Diagnosis not present

## 2015-02-24 DIAGNOSIS — E782 Mixed hyperlipidemia: Secondary | ICD-10-CM | POA: Diagnosis not present

## 2015-02-24 DIAGNOSIS — I2510 Atherosclerotic heart disease of native coronary artery without angina pectoris: Secondary | ICD-10-CM | POA: Diagnosis not present

## 2015-02-24 DIAGNOSIS — Z951 Presence of aortocoronary bypass graft: Secondary | ICD-10-CM

## 2015-02-24 DIAGNOSIS — I4892 Unspecified atrial flutter: Secondary | ICD-10-CM | POA: Diagnosis not present

## 2015-02-24 DIAGNOSIS — J948 Other specified pleural conditions: Secondary | ICD-10-CM

## 2015-02-24 DIAGNOSIS — J90 Pleural effusion, not elsewhere classified: Secondary | ICD-10-CM

## 2015-02-24 MED ORDER — LOSARTAN POTASSIUM 25 MG PO TABS: 25.0000 mg | ORAL_TABLET | Freq: Every day | ORAL | Status: DC

## 2015-02-24 NOTE — Patient Instructions (Addendum)
Your physician recommends that you schedule a follow-up appointment in: 6 weeks with Dr.McDowell  DECREASE Losartan to 25 mg daily  STOP Aspirin

## 2015-02-24 NOTE — Progress Notes (Signed)
  Cardiology Office Note  Date: 02/24/2015   ID: Devin [name], DOB 10/10/1946, MRN [id number]  PCP: FAGAN,ROY, MD  Primary Cardiologist: Samuel McDowell, MD   Chief Complaint  Patient presents with  . Coronary Artery Disease  . Hypertension  . Atrial Flutter    History of Present Illness: Devin [name] is a 69 y.o. male just seen in the office on April 15 by Ms. Lawrence NP. I met him for the first time in March. Recent interval history reviewed including documented atrial flutter ultimately requiring TEE cardioversion and amiodarone, started on Xarelto by Dr. Branch in early April. Subsequent to this he underwent left-sided thoracentesis per Dr. Van Trigt due to left pleural effusion, complicated by patient reported intolerance to taking diuretics (will not take Lasix).  He comes in today for a routine visit. States that he has been doing relatively well, still going to cardiac rehabilitation. He has noticed lightheadedness at times when he stands, sometimes after exercise. He has had relatively low blood pressures documented as well. Losartan dose was already cut back previously.  He states that he continues on Xarelto (has also been on aspirin) and amiodarone now at 200 mg daily. No palpitations, he remains in sinus rhythm today on examination. We have discussed continuing current course, may be able to stop both medications in the next 6-8 weeks if no recurrent arrhythmia, then observe for recurrence.   Past Medical History  Diagnosis Date  . Essential hypertension   . Prostatic hypertrophy   . Melanoma of back   . Asbestosis   . Sleep apnea     Intolerant of CPAP  . GERD (gastroesophageal reflux disease)   . Arthritis   . 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    Past Surgical History  Procedure Laterality Date  . Anterior cervical decomp/discectomy fusion    . Shoulder open  rotator cuff repair Left   . Knee arthroscopy Right   . Knee cartilage surgery Left   . Melanoma excision      "back"  . Left heart catheterization with coronary angiogram N/A 12/28/2014    Procedure: LEFT HEART CATHETERIZATION WITH CORONARY ANGIOGRAM;  Surgeon: Jonathan J Berry, MD;  Location: [facility] CATH LAB;  Service: Cardiovascular;  Laterality: N/A;  . Coronary artery bypass graft N/A 12/29/2014    Procedure: CORONARY ARTERY BYPASS GRAFTING (CABG), ON PUMP, TIMES FOUR, USING LEFT INTERNAL MAMMARY ARTERY, RIGHT GREATER SAPHENOUS VEIN HARVESTED ENDOSCOPICALLY;  Surgeon: Peter Van Trigt, MD;  Location: [facility];  Service: Open Heart Surgery;  Laterality: N/A;  . Tee without cardioversion N/A 12/29/2014    Procedure: TRANSESOPHAGEAL ECHOCARDIOGRAM (TEE);  Surgeon: Peter Van Trigt, MD;  Location: [facility];  Service: Open Heart Surgery;  Laterality: N/A;  . Tee without cardioversion N/A 01/28/2015    Procedure: TRANSESOPHAGEAL ECHOCARDIOGRAM (TEE);  Surgeon: Jonathan F Branch, MD;  Location: [facility];  Service: Endoscopy;  Laterality: N/A;  . Cardioversion N/A 01/28/2015    Procedure: CARDIOVERSION;  Surgeon: Jonathan F Branch, MD;  Location: [facility];  Service: Endoscopy;  Laterality: N/A;    Current Outpatient Prescriptions  Medication Sig Dispense Refill  . amiodarone (PACERONE) 200 MG tablet Take 1 tablet (200 mg total) by mouth 2 (two) times daily. Take 1 twice a day for 2 weeks then 1 daily 60 tablet 0  . atorvastatin (LIPITOR) 40 MG tablet Take   40 mg by mouth daily.    . doxazosin (CARDURA) 4 MG tablet Take 4 mg by mouth 2 (two) times daily.    . rivaroxaban (XARELTO) 20 MG TABS tablet Take 1 tablet (20 mg total) by mouth daily. 30 tablet 2  . Testosterone Cypionate 200 MG/ML KIT Inject 400 mg into the muscle every 14 (fourteen) days.    . ibuprofen (ADVIL,MOTRIN) 200 MG tablet Take 400 mg by mouth every 6 (six) hours as needed for mild pain or moderate pain.    . losartan (COZAAR) 25 MG tablet Take 1  tablet (25 mg total) by mouth daily. 90 tablet 3   No current facility-administered medications for this visit.    Allergies:  Lasix   Social History: The patient  reports that he has never smoked. He has never used smokeless tobacco. He reports that he drinks alcohol. He reports that he does not use illicit drugs.   ROS:  Please see the history of present illness. Otherwise, complete review of systems is positive for left groin pain in region of tendon to thigh, states that this is gradually getting better..  All other systems are reviewed and negative.   Physical Exam: VS:  BP 104/68 mmHg  Pulse 82  Ht 6' (1.829 m)  Wt 259 lb (117.482 kg)  BMI 35.12 kg/m2  SpO2 95%, BMI Body mass index is 35.12 kg/(m^2).  Wt Readings from Last 3 Encounters:  02/24/15 259 lb (117.482 kg)  02/12/15 261 lb (118.389 kg)  02/08/15 255 lb (115.667 kg)     General: Obese male, appears comfortable at rest. HEENT: Conjunctiva and lids normal, oropharynx clear with moist mucosa. Neck: Supple, no elevated JVP or carotid bruits, no thyromegaly. Lungs: Decreased breath sounds at the bases, nonlabored breathing at rest. Thorax: Well-healing sternal incision. Cardiac: Regular rate and rhythm, no S3 or significant systolic murmur, no pericardial rub. Abdomen: Soft, nontender, bowel sounds present, no guarding or rebound. Extremities: Mild right leg edema below the knee, distal pulses 1-2+. Skin: Warm and dry. Musculoskeletal: No kyphosis. Neuropsychiatric: Alert and oriented x3, affect grossly appropriate.   ECG: ECG is not ordered today.  Recent Labwork: 01/15/2015: Pro B Natriuretic peptide (BNP) 525.90* 01/23/2015: ALT 18; AST 13; TSH 1.608 01/25/2015: Magnesium 2.1 01/26/2015: Hemoglobin 13.5; Platelets 154 01/28/2015: BUN 16; Creatinine 1.16; Potassium 4.3; Sodium 138     Component Value Date/Time   CHOL 171 12/26/2014 0245   TRIG 120 12/26/2014 0245   HDL 31* 12/26/2014 0245   CHOLHDL 5.5  12/26/2014 0245   VLDL 24 12/26/2014 0245   LDLCALC 116* 12/26/2014 0245    Other Studies Reviewed Today:  TEE 01/28/2015: Study Conclusions  - Left ventricle: The cavity size was normal. Wall thickness was normal. Systolic function was normal. The estimated ejection fraction was in the range of 55% to 60%. No evidence of thrombus. - Left atrium: The atrium was mildly dilated. No evidence of thrombus in the atrial cavity or appendage. No evidence of thrombus in the atrial cavity or appendage. - Atrial septum: No defect or patent foramen ovale was identified.  Impressions:  - No evidence of intracardiac thrombus. Will proceed with cardioversion.   ASSESSMENT AND PLAN:  1. Multivessel CAD status post CABG in March. Continue cardiac rehabilitation.  2. Essential hypertension, recent blood pressures on the low side and associated with dizziness. Losartan is being cut back to 25 mg daily.  3. Recently documented atrial flutter requiring TEE guided cardioversion. Stop aspirin, continue Xarelto and amiodarone.   We will reassess in 6 weeks.  4. Hyperlipidemia, Lipitor recently cut back to 40 mg daily by Dr. Fagan. Requesting most recent lab work.  Current medicines were reviewed at length with the patient today.   Disposition: FU with me in 6 weeks.   Signed, Samuel G. McDowell, MD, FACC 02/24/2015 10:17 AM    [facility] [address], [place], [place] [address] Phone: [phone]; Fax: [phone]  

## 2015-02-24 NOTE — Progress Notes (Signed)
PCP is [name], MD Referring Provider is Lorretta Harp, MD  Chief Complaint  Patient presents with  . Routine Post Op    2 week f/u with CXR    HPI:2 month followup after multivessel CABG for unstable angina. At the office visit 2 weeks ago the patient had a large left pleural effusion and underwent left thoracentesis and drainage 1.2 L of bloody fluid The patient had been placed on xarelto for atrial fibrillation which developed after discharge from the hospital. He converted to sinus rhythm with amiodarone. He is maintained sinus rhythm but he still is on xarelto. He denies shortness of breath and is walking up 2.5 miles a day  Chest x-ray today shows significant room and left pleural effusion with mild blunting of the left costophrenic angle     Past Medical History  Diagnosis Date  . Essential hypertension   . Prostatic hypertrophy   . Melanoma of back   . Asbestosis   . Sleep apnea     Intolerant of CPAP  . GERD (gastroesophageal reflux disease)   . Arthritis   . 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    Past Surgical History  Procedure Laterality Date  . Anterior cervical decomp/discectomy fusion    . Shoulder open rotator cuff repair Left   . Knee arthroscopy Right   . Knee cartilage surgery Left   . Melanoma excision      "back"  . Left heart catheterization with coronary angiogram N/A 12/28/2014    Procedure: LEFT HEART CATHETERIZATION WITH CORONARY ANGIOGRAM;  Surgeon: Lorretta Harp, MD;  Location: [facility] CATH LAB;  Service: Cardiovascular;  Laterality: N/A;  . Coronary artery bypass graft N/A 12/29/2014    Procedure: CORONARY ARTERY BYPASS GRAFTING (CABG), ON PUMP, TIMES FOUR, USING LEFT INTERNAL MAMMARY ARTERY, RIGHT GREATER SAPHENOUS VEIN HARVESTED ENDOSCOPICALLY;  Surgeon: Ivin Poot, MD;  Location: [facility];  Service: Open Heart Surgery;  Laterality: N/A;  . Tee  without cardioversion N/A 12/29/2014    Procedure: TRANSESOPHAGEAL ECHOCARDIOGRAM (TEE);  Surgeon: Ivin Poot, MD;  Location: [facility];  Service: Open Heart Surgery;  Laterality: N/A;  . Tee without cardioversion N/A 01/28/2015    Procedure: TRANSESOPHAGEAL ECHOCARDIOGRAM (TEE);  Surgeon: Arnoldo Lenis, MD;  Location: [facility];  Service: Endoscopy;  Laterality: N/A;  . Cardioversion N/A 01/28/2015    Procedure: CARDIOVERSION;  Surgeon: Arnoldo Lenis, MD;  Location: [facility];  Service: Endoscopy;  Laterality: N/A;    Family History  Problem Relation Age of Onset  . Stroke Mother   . Hypertension Mother   . Heart disease Sister     Died from complications of RHD  . Heart disease Brother     Heart failure related to EtOH and smoking  . Stomach cancer Sister     Social History History  Substance Use Topics  . Smoking status: Never Smoker   . Smokeless tobacco: Never Used  . Alcohol Use: 0.0 oz/week    0 Standard drinks or equivalent per week     Comment: Occasional    Current Outpatient Prescriptions  Medication Sig Dispense Refill  . amiodarone (PACERONE) 200 MG tablet Take 1 tablet (200 mg total) by mouth 2 (two) times daily. Take 1 twice a day for 2 weeks then 1 daily 60 tablet 0  . atorvastatin (LIPITOR) 40 MG tablet Take 40 mg  by mouth daily.    . clonazePAM (KLONOPIN) 0.5 MG tablet 0.5 mg at bedtime.     Marland Kitchen doxazosin (CARDURA) 4 MG tablet Take 4 mg by mouth 2 (two) times daily.    Marland Kitchen ibuprofen (ADVIL,MOTRIN) 200 MG tablet Take 400 mg by mouth every 6 (six) hours as needed for mild pain or moderate pain.    Marland Kitchen losartan (COZAAR) 25 MG tablet Take 1 tablet (25 mg total) by mouth daily. 90 tablet 3  . rivaroxaban (XARELTO) 20 MG TABS tablet Take 1 tablet (20 mg total) by mouth daily. 30 tablet 2  . Testosterone Cypionate 200 MG/ML KIT Inject 400 mg into the muscle every 14 (fourteen) days.     No current facility-administered medications for this visit.    Allergies  Allergen  Reactions  . Lasix [Furosemide] Nausea Only    Review of Systems   Improved exercise tolerance since surgery No recurrent angina Surgical incisions healing well BP 106/65 mmHg  Pulse 82  Resp 20  Ht 6' (1.829 m)  Wt 259 lb (117.482 kg)  BMI 35.12 kg/m2  SpO2 96% Physical Exam Alert and comfortable Breath sounds clear and equal bilaterally Heart rhythm regular Minimal pedal edema  Diagnostic Tests: Chest x-ray personally reviewed which shows minimal left pleural effusion  Impression: Improved left pleural effusion following CABG with possible worsening with the addition of Xarelto  Plan:patient return in 6-8 weeks for final chest x-ray to make sure the ureters not recur. He will continue the xarelto  Another four-week since stopping start taking his aspirin 81 mg daily.   Len Childs, MD [facility] [phone]

## 2015-02-26 ENCOUNTER — Encounter (HOSPITAL_COMMUNITY)
Admission: RE | Admit: 2015-02-26 | Discharge: 2015-02-26 | Disposition: A | Discharge status: Home / Self Care | Payer: Medicare Other | Source: Ambulatory Visit | Attending: Cardiovascular Disease | Admitting: Cardiovascular Disease

## 2015-02-26 DIAGNOSIS — I2510 Atherosclerotic heart disease of native coronary artery without angina pectoris: Secondary | ICD-10-CM | POA: Diagnosis not present

## 2015-02-26 DIAGNOSIS — Z951 Presence of aortocoronary bypass graft: Secondary | ICD-10-CM | POA: Diagnosis not present

## 2015-03-01 ENCOUNTER — Encounter (HOSPITAL_COMMUNITY)
Admission: RE | Admit: 2015-03-01 | Discharge: 2015-03-01 | Disposition: A | Discharge status: Home / Self Care | Payer: Medicare Other | Source: Ambulatory Visit | Attending: Cardiovascular Disease | Admitting: Cardiovascular Disease

## 2015-03-01 DIAGNOSIS — N529 Male erectile dysfunction, unspecified: Secondary | ICD-10-CM | POA: Diagnosis not present

## 2015-03-01 DIAGNOSIS — I2510 Atherosclerotic heart disease of native coronary artery without angina pectoris: Secondary | ICD-10-CM | POA: Diagnosis not present

## 2015-03-01 DIAGNOSIS — Z951 Presence of aortocoronary bypass graft: Secondary | ICD-10-CM | POA: Diagnosis not present

## 2015-03-03 ENCOUNTER — Encounter (HOSPITAL_COMMUNITY)
Admission: RE | Admit: 2015-03-03 | Discharge: 2015-03-03 | Disposition: A | Discharge status: Home / Self Care | Payer: Medicare Other | Source: Ambulatory Visit | Attending: Cardiovascular Disease | Admitting: Cardiovascular Disease

## 2015-03-03 DIAGNOSIS — I2510 Atherosclerotic heart disease of native coronary artery without angina pectoris: Secondary | ICD-10-CM | POA: Diagnosis not present

## 2015-03-03 DIAGNOSIS — Z951 Presence of aortocoronary bypass graft: Secondary | ICD-10-CM | POA: Diagnosis not present

## 2015-03-05 ENCOUNTER — Encounter (HOSPITAL_COMMUNITY)
Admission: RE | Admit: 2015-03-05 | Discharge: 2015-03-05 | Disposition: A | Discharge status: Home / Self Care | Payer: Medicare Other | Source: Ambulatory Visit | Attending: Cardiovascular Disease | Admitting: Cardiovascular Disease

## 2015-03-05 DIAGNOSIS — I2510 Atherosclerotic heart disease of native coronary artery without angina pectoris: Secondary | ICD-10-CM | POA: Diagnosis not present

## 2015-03-05 DIAGNOSIS — Z951 Presence of aortocoronary bypass graft: Secondary | ICD-10-CM | POA: Diagnosis not present

## 2015-03-08 ENCOUNTER — Encounter (HOSPITAL_COMMUNITY)
Admission: RE | Admit: 2015-03-08 | Discharge: 2015-03-08 | Disposition: A | Discharge status: Home / Self Care | Payer: Medicare Other | Source: Ambulatory Visit | Attending: Cardiovascular Disease | Admitting: Cardiovascular Disease

## 2015-03-08 DIAGNOSIS — Z951 Presence of aortocoronary bypass graft: Secondary | ICD-10-CM | POA: Diagnosis not present

## 2015-03-08 DIAGNOSIS — I2510 Atherosclerotic heart disease of native coronary artery without angina pectoris: Secondary | ICD-10-CM | POA: Diagnosis not present

## 2015-03-10 ENCOUNTER — Telehealth: Payer: Self-pay | Admitting: Cardiology

## 2015-03-10 ENCOUNTER — Encounter (HOSPITAL_COMMUNITY)
Admission: RE | Admit: 2015-03-10 | Discharge: 2015-03-10 | Disposition: A | Discharge status: Home / Self Care | Payer: Medicare Other | Source: Ambulatory Visit | Attending: Cardiovascular Disease | Admitting: Cardiovascular Disease

## 2015-03-10 DIAGNOSIS — I2510 Atherosclerotic heart disease of native coronary artery without angina pectoris: Secondary | ICD-10-CM | POA: Diagnosis not present

## 2015-03-10 DIAGNOSIS — Z951 Presence of aortocoronary bypass graft: Secondary | ICD-10-CM | POA: Diagnosis not present

## 2015-03-10 NOTE — Telephone Encounter (Signed)
DR Nadara Mustard is wanting clearance faxed over to them so that patient can have his teeth cleaned. Also wanting to know if he will need to be on antibiotic   [phone]

## 2015-03-11 NOTE — Telephone Encounter (Signed)
Will forward to [facility] NP for dispo

## 2015-03-12 ENCOUNTER — Encounter (HOSPITAL_COMMUNITY)
Admission: RE | Admit: 2015-03-12 | Discharge: 2015-03-12 | Disposition: A | Discharge status: Home / Self Care | Payer: Medicare Other | Source: Ambulatory Visit | Attending: Cardiovascular Disease | Admitting: Cardiovascular Disease

## 2015-03-12 DIAGNOSIS — Z951 Presence of aortocoronary bypass graft: Secondary | ICD-10-CM | POA: Diagnosis not present

## 2015-03-12 DIAGNOSIS — I2510 Atherosclerotic heart disease of native coronary artery without angina pectoris: Secondary | ICD-10-CM | POA: Diagnosis not present

## 2015-03-12 NOTE — Telephone Encounter (Signed)
Faxed note to Dr Hedda Slade [phone]

## 2015-03-12 NOTE — Telephone Encounter (Signed)
Please try calling the patient on his cell phone   [phone]

## 2015-03-12 NOTE — Telephone Encounter (Signed)
Pt states dentist wants to wait 4 months after CABG for teeth cleaning. Jory Sims NP states it is up to the dentist as to when he wants to clean patients teeth.She states he does not need antibiotic prophylaxis

## 2015-03-12 NOTE — Telephone Encounter (Signed)
Attempt to reach patient,no answer,no machine °

## 2015-03-12 NOTE — Telephone Encounter (Signed)
He can have his teeth cleaned. No need for antibiotic prophylaxis.

## 2015-03-15 ENCOUNTER — Encounter (HOSPITAL_COMMUNITY)
Admission: RE | Admit: 2015-03-15 | Discharge: 2015-03-15 | Disposition: A | Discharge status: Home / Self Care | Payer: Medicare Other | Source: Ambulatory Visit | Attending: Cardiovascular Disease | Admitting: Cardiovascular Disease

## 2015-03-15 DIAGNOSIS — Z951 Presence of aortocoronary bypass graft: Secondary | ICD-10-CM | POA: Diagnosis not present

## 2015-03-15 DIAGNOSIS — N529 Male erectile dysfunction, unspecified: Secondary | ICD-10-CM | POA: Diagnosis not present

## 2015-03-15 DIAGNOSIS — I2510 Atherosclerotic heart disease of native coronary artery without angina pectoris: Secondary | ICD-10-CM | POA: Diagnosis not present

## 2015-03-16 NOTE — Progress Notes (Signed)
Cardiac Rehabilitation Program Outcomes Report   Orientation:  01/14/15 Graduate Date:  tbd Discharge Date:  tbd # of sessions completed: 18  Cardiologist: Gaynelle Cage MD:  Homero Fellers Time:  1100  A.  Exercise Program:  Tolerates exercise @ 3.74 METS for 15 minutes  B.  Mental Health:  Good mental attitude  C.  Education/Instruction/Skills  Accurately checks own pulse.  Rest:  66  Exercise:  105 and Knows THR for exercise  Uses Perceived Exertion Scale and/or Dyspnea Scale  D.  Nutrition/Weight Control/Body Composition:  Adherence to prescribed nutrition program: good    E.  Blood Lipids    Lab Results  Component Value Date   CHOL 171 12/26/2014   HDL 31* 12/26/2014   LDLCALC 116* 12/26/2014   TRIG 120 12/26/2014   CHOLHDL 5.5 12/26/2014    F.  Lifestyle Changes:  Making positive lifestyle changes  G.  Symptoms noted with exercise:  Asymptomatic  Report Completed By:  Stevphen Rochester RN   Comments:  This is patients halfway progress note for [facility].

## 2015-03-17 ENCOUNTER — Encounter (HOSPITAL_COMMUNITY)
Admission: RE | Admit: 2015-03-17 | Discharge: 2015-03-17 | Disposition: A | Discharge status: Home / Self Care | Payer: Medicare Other | Source: Ambulatory Visit | Attending: Cardiovascular Disease | Admitting: Cardiovascular Disease

## 2015-03-17 DIAGNOSIS — I2510 Atherosclerotic heart disease of native coronary artery without angina pectoris: Secondary | ICD-10-CM | POA: Diagnosis not present

## 2015-03-17 DIAGNOSIS — Z951 Presence of aortocoronary bypass graft: Secondary | ICD-10-CM | POA: Diagnosis not present

## 2015-03-19 ENCOUNTER — Encounter (HOSPITAL_COMMUNITY)
Admission: RE | Admit: 2015-03-19 | Discharge: 2015-03-19 | Disposition: A | Discharge status: Home / Self Care | Payer: Medicare Other | Source: Ambulatory Visit | Attending: Cardiovascular Disease | Admitting: Cardiovascular Disease

## 2015-03-19 DIAGNOSIS — Z951 Presence of aortocoronary bypass graft: Secondary | ICD-10-CM | POA: Diagnosis not present

## 2015-03-19 DIAGNOSIS — I2510 Atherosclerotic heart disease of native coronary artery without angina pectoris: Secondary | ICD-10-CM | POA: Diagnosis not present

## 2015-03-22 ENCOUNTER — Encounter (HOSPITAL_COMMUNITY)
Admission: RE | Admit: 2015-03-22 | Discharge: 2015-03-22 | Disposition: A | Discharge status: Home / Self Care | Payer: Medicare Other | Source: Ambulatory Visit | Attending: Cardiovascular Disease | Admitting: Cardiovascular Disease

## 2015-03-22 DIAGNOSIS — I2510 Atherosclerotic heart disease of native coronary artery without angina pectoris: Secondary | ICD-10-CM | POA: Diagnosis not present

## 2015-03-22 DIAGNOSIS — Z951 Presence of aortocoronary bypass graft: Secondary | ICD-10-CM | POA: Diagnosis not present

## 2015-03-24 ENCOUNTER — Encounter (HOSPITAL_COMMUNITY)
Admission: RE | Admit: 2015-03-24 | Discharge: 2015-03-24 | Disposition: A | Discharge status: Home / Self Care | Payer: Medicare Other | Source: Ambulatory Visit | Attending: Cardiovascular Disease | Admitting: Cardiovascular Disease

## 2015-03-24 DIAGNOSIS — Z951 Presence of aortocoronary bypass graft: Secondary | ICD-10-CM | POA: Diagnosis not present

## 2015-03-24 DIAGNOSIS — I2510 Atherosclerotic heart disease of native coronary artery without angina pectoris: Secondary | ICD-10-CM | POA: Diagnosis not present

## 2015-03-26 ENCOUNTER — Encounter (HOSPITAL_COMMUNITY)
Admission: RE | Admit: 2015-03-26 | Discharge: 2015-03-26 | Disposition: A | Discharge status: Home / Self Care | Payer: Medicare Other | Source: Ambulatory Visit | Attending: Cardiovascular Disease | Admitting: Cardiovascular Disease

## 2015-03-26 DIAGNOSIS — I2510 Atherosclerotic heart disease of native coronary artery without angina pectoris: Secondary | ICD-10-CM | POA: Diagnosis not present

## 2015-03-26 DIAGNOSIS — Z951 Presence of aortocoronary bypass graft: Secondary | ICD-10-CM | POA: Diagnosis not present

## 2015-03-29 ENCOUNTER — Encounter (HOSPITAL_COMMUNITY): Payer: Medicare Other

## 2015-03-31 ENCOUNTER — Encounter (HOSPITAL_COMMUNITY)
Admission: RE | Admit: 2015-03-31 | Discharge: 2015-03-31 | Disposition: A | Discharge status: Home / Self Care | Payer: Medicare Other | Source: Ambulatory Visit | Attending: Cardiovascular Disease | Admitting: Cardiovascular Disease

## 2015-03-31 DIAGNOSIS — I2510 Atherosclerotic heart disease of native coronary artery without angina pectoris: Secondary | ICD-10-CM | POA: Diagnosis not present

## 2015-03-31 DIAGNOSIS — Z951 Presence of aortocoronary bypass graft: Secondary | ICD-10-CM | POA: Insufficient documentation

## 2015-03-31 DIAGNOSIS — N529 Male erectile dysfunction, unspecified: Secondary | ICD-10-CM | POA: Diagnosis not present

## 2015-04-02 ENCOUNTER — Encounter (HOSPITAL_COMMUNITY)
Admission: RE | Admit: 2015-04-02 | Discharge: 2015-04-02 | Disposition: A | Discharge status: Home / Self Care | Payer: Medicare Other | Source: Ambulatory Visit | Attending: Cardiovascular Disease | Admitting: Cardiovascular Disease

## 2015-04-02 DIAGNOSIS — Z951 Presence of aortocoronary bypass graft: Secondary | ICD-10-CM | POA: Diagnosis not present

## 2015-04-02 DIAGNOSIS — N401 Enlarged prostate with lower urinary tract symptoms: Secondary | ICD-10-CM | POA: Diagnosis not present

## 2015-04-02 DIAGNOSIS — I2510 Atherosclerotic heart disease of native coronary artery without angina pectoris: Secondary | ICD-10-CM | POA: Diagnosis not present

## 2015-04-02 DIAGNOSIS — E291 Testicular hypofunction: Secondary | ICD-10-CM | POA: Diagnosis not present

## 2015-04-05 ENCOUNTER — Encounter (HOSPITAL_COMMUNITY)
Admission: RE | Admit: 2015-04-05 | Discharge: 2015-04-05 | Disposition: A | Discharge status: Home / Self Care | Payer: Medicare Other | Source: Ambulatory Visit | Attending: Cardiovascular Disease | Admitting: Cardiovascular Disease

## 2015-04-05 DIAGNOSIS — I2510 Atherosclerotic heart disease of native coronary artery without angina pectoris: Secondary | ICD-10-CM | POA: Diagnosis not present

## 2015-04-05 DIAGNOSIS — Z951 Presence of aortocoronary bypass graft: Secondary | ICD-10-CM | POA: Diagnosis not present

## 2015-04-06 ENCOUNTER — Other Ambulatory Visit: Payer: Self-pay | Admitting: Cardiothoracic Surgery

## 2015-04-06 DIAGNOSIS — Z951 Presence of aortocoronary bypass graft: Secondary | ICD-10-CM

## 2015-04-07 ENCOUNTER — Encounter: Payer: Self-pay | Admitting: Cardiothoracic Surgery

## 2015-04-07 ENCOUNTER — Ambulatory Visit
Admission: RE | Admit: 2015-04-07 | Discharge: 2015-04-07 | Disposition: A | Discharge status: Home / Self Care | Payer: Medicare Other | Source: Ambulatory Visit | Attending: Cardiothoracic Surgery | Admitting: Cardiothoracic Surgery

## 2015-04-07 ENCOUNTER — Encounter (HOSPITAL_COMMUNITY)
Admission: RE | Admit: 2015-04-07 | Discharge: 2015-04-07 | Disposition: A | Discharge status: Home / Self Care | Payer: Medicare Other | Source: Ambulatory Visit | Attending: Cardiovascular Disease | Admitting: Cardiovascular Disease

## 2015-04-07 ENCOUNTER — Ambulatory Visit (INDEPENDENT_AMBULATORY_CARE_PROVIDER_SITE_OTHER): Payer: Medicare Other | Admitting: Cardiothoracic Surgery

## 2015-04-07 ENCOUNTER — Ambulatory Visit (INDEPENDENT_AMBULATORY_CARE_PROVIDER_SITE_OTHER): Payer: Medicare Other | Admitting: Cardiology

## 2015-04-07 ENCOUNTER — Encounter: Payer: Self-pay | Admitting: Cardiology

## 2015-04-07 VITALS — BP 100/58 | HR 86 | Resp 16 | Ht 72.0 in | Wt 259.0 lb

## 2015-04-07 VITALS — BP 106/64 | HR 82 | Ht 72.0 in | Wt 259.0 lb

## 2015-04-07 DIAGNOSIS — Z951 Presence of aortocoronary bypass graft: Secondary | ICD-10-CM

## 2015-04-07 DIAGNOSIS — Z8679 Personal history of other diseases of the circulatory system: Secondary | ICD-10-CM | POA: Diagnosis not present

## 2015-04-07 DIAGNOSIS — I2510 Atherosclerotic heart disease of native coronary artery without angina pectoris: Secondary | ICD-10-CM

## 2015-04-07 DIAGNOSIS — J948 Other specified pleural conditions: Secondary | ICD-10-CM

## 2015-04-07 DIAGNOSIS — J90 Pleural effusion, not elsewhere classified: Secondary | ICD-10-CM

## 2015-04-07 DIAGNOSIS — I517 Cardiomegaly: Secondary | ICD-10-CM | POA: Diagnosis not present

## 2015-04-07 DIAGNOSIS — I10 Essential (primary) hypertension: Secondary | ICD-10-CM | POA: Diagnosis not present

## 2015-04-07 DIAGNOSIS — I4891 Unspecified atrial fibrillation: Secondary | ICD-10-CM

## 2015-04-07 DIAGNOSIS — E782 Mixed hyperlipidemia: Secondary | ICD-10-CM | POA: Diagnosis not present

## 2015-04-07 NOTE — Progress Notes (Signed)
Cardiology Office Note  Date: 04/07/2015   ID: JONAN SEUFERT, DOB [birth year], MRN [id number]  PCP: [name], MD  Primary Cardiologist: Rozann Lesches, MD   Chief Complaint  Patient presents with  . Coronary Artery Disease    History of Present Illness: Devin [name] is a 69 y.o. male last seen in April. He has continued in cardiac rehabilitation, states that strength has improved. He continues to progress, doing outdoor work and some walking for exercise. He has some musculoskeletal thoracic discomfort which is improving, no angina symptoms or increasing shortness of breath.  At the last visit we cut back losartan dose to avoid hypotension, aspirin was also stopped. He was maintained on Xarelto and amiodarone, with history of postoperative atrial flutter that required cardioversion. Since then, Dr. Prescott Gum stopped Xarelto and he is back on aspirin. Follow-up ECG today confirms sinus rhythm, no complaints of palpitations. We discussed stopping amiodarone at this point and continuing observation.  His most recent lipid panel is noted below. He states that Dr. Willey Blade cut Lipitor from 80 mg down to 40 mg when these labs were obtained.   Past Medical History  Diagnosis Date  . Essential hypertension   . Prostatic hypertrophy   . Melanoma of back   . Asbestosis   . Sleep apnea     Intolerant of CPAP  . GERD (gastroesophageal reflux disease)   . Arthritis   . 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    Past Surgical History  Procedure Laterality Date  . Anterior cervical decomp/discectomy fusion    . Shoulder open rotator cuff repair Left   . Knee arthroscopy Right   . Knee cartilage surgery Left   . Melanoma excision      "back"  . Left heart catheterization with coronary angiogram N/A 12/28/2014    Procedure: LEFT HEART CATHETERIZATION WITH CORONARY ANGIOGRAM;  Surgeon: Lorretta Harp, MD;  Location: [facility] CATH LAB;  Service: Cardiovascular;  Laterality: N/A;  . Coronary artery bypass graft N/A 12/29/2014    Procedure: CORONARY ARTERY BYPASS GRAFTING (CABG), ON PUMP, TIMES FOUR, USING LEFT INTERNAL MAMMARY ARTERY, RIGHT GREATER SAPHENOUS VEIN HARVESTED ENDOSCOPICALLY;  Surgeon: Ivin Poot, MD;  Location: [place];  Service: Open Heart Surgery;  Laterality: N/A;  . Tee without cardioversion N/A 12/29/2014    Procedure: TRANSESOPHAGEAL ECHOCARDIOGRAM (TEE);  Surgeon: Ivin Poot, MD;  Location: [place];  Service: Open Heart Surgery;  Laterality: N/A;  . Tee without cardioversion N/A 01/28/2015    Procedure: TRANSESOPHAGEAL ECHOCARDIOGRAM (TEE);  Surgeon: Arnoldo Lenis, MD;  Location: [facility];  Service: Endoscopy;  Laterality: N/A;  . Cardioversion N/A 01/28/2015    Procedure: CARDIOVERSION;  Surgeon: Arnoldo Lenis, MD;  Location: [facility];  Service: Endoscopy;  Laterality: N/A;    Current Outpatient Prescriptions  Medication Sig Dispense Refill  . aspirin 81 MG tablet Take 81 mg by mouth daily.    Marland Kitchen atorvastatin (LIPITOR) 40 MG tablet Take 40 mg by mouth daily.    . clonazePAM (KLONOPIN) 0.5 MG tablet 0.5 mg at bedtime.     Marland Kitchen doxazosin (CARDURA) 4 MG tablet Take 4 mg by mouth 2 (two) times daily.    Marland Kitchen losartan (COZAAR) 25 MG tablet Take 1 tablet (25 mg total) by mouth daily. 90 tablet 3  . Testosterone Cypionate 200 MG/ML KIT Inject 400  mg into the muscle every 14 (fourteen) days.     No current facility-administered medications for this visit.    Allergies:  Lasix   Social History: The patient  reports that he has never smoked. He has never used smokeless tobacco. He reports that he drinks alcohol. He reports that he does not use illicit drugs.   ROS:  Please see the history of present illness. Otherwise, complete review of systems is positive for intermittent musculoskeletal thoracic discomfort.  All other systems are reviewed and negative.   Physical Exam: VS:   BP 106/64 mmHg  Pulse 82  Ht 6' (1.829 m)  Wt 259 lb (117.482 kg)  BMI 35.12 kg/m2  SpO2 97%, BMI Body mass index is 35.12 kg/(m^2).  Wt Readings from Last 3 Encounters:  04/07/15 259 lb (117.482 kg)  02/24/15 259 lb (117.482 kg)  02/24/15 259 lb (117.482 kg)     General: Obese male, appears comfortable at rest. HEENT: Conjunctiva and lids normal, oropharynx clear with moist mucosa. Neck: Supple, no elevated JVP or carotid bruits, no thyromegaly. Lungs: Decreased breath sounds at the bases, nonlabored breathing at rest. Thorax: Well-healed sternal incision. Cardiac: Regular rate and rhythm, no S3 or significant systolic murmur, no pericardial rub. Abdomen: Soft, nontender, bowel sounds present, no guarding or rebound. Extremities: Trace ankle edema, distal pulses 1-2+. Skin: Warm and dry. Musculoskeletal: No kyphosis. Neuropsychiatric: Alert and oriented x3, affect grossly appropriate.   ECG: ECG is ordered today and shows normal sinus rhythm with right bundle branch block and left anterior fascicular block.  Recent Labwork:  02/01/2015: BUN 15, creatinine 1.0, potassium 4.3, AST 14, ALT 12, hemoglobin 13.1, platelets 142, cholesterol 83, triglycerides 53, HDL 36, LDL 36   ASSESSMENT AND PLAN:  1. Multivessel CAD status post CABG in March 2016. He is progressing well, has enjoyed cardiac rehabilitation. Plan to continue observation.  2. History of postoperative atrial flutter status post TEE cardioversion. He has had no further arrhythmias documented since March, is in sinus rhythm today by ECG. He has already transitioned off of Xarelto back to aspirin, we will go ahead and stop amiodarone as well.  3. Hyperlipidemia, LDL was down to 36 as of April, and Lipitor was cut back to 40 mg daily.  Current medicines were reviewed at length with the patient today.   Orders Placed This Encounter  Procedures  . EKG 12-Lead    Disposition: FU with me in 6  months.   Signed, Satira Sark, MD, [facility] 04/07/2015 9:21 AM    [facility] [place]. [address] [place], [place] [address] Phone: [phone]; Fax: [phone]

## 2015-04-07 NOTE — Progress Notes (Signed)
PCP is [name], MD Referring Provider is Lorretta Harp, MD  Chief Complaint  Patient presents with  . Routine Post Op    6 week f/u with CXR left pleural effusion s/p CABG    HPI:the patient returns for final postop visit after urgent CABG. On his last visit he had a left pleural effusion which required thoracentesis. Chest x-ray taken today and personally reviewed showed complete resolution of the left pleural effusion, clear lungs and normal cardiac silhouette. The patient has had no recurrent angina and is very active on a farm. The patient has stopped his Xarelto and amiodarone for postop atrial fibrillation--currently in sinus rhythm.   Past Medical History  Diagnosis Date  . Essential hypertension   . Prostatic hypertrophy   . Melanoma of back   . Asbestosis   . Sleep apnea     Intolerant of CPAP  . GERD (gastroesophageal reflux disease)   . Arthritis   . 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    Past Surgical History  Procedure Laterality Date  . Anterior cervical decomp/discectomy fusion    . Shoulder open rotator cuff repair Left   . Knee arthroscopy Right   . Knee cartilage surgery Left   . Melanoma excision      "back"  . Left heart catheterization with coronary angiogram N/A 12/28/2014    Procedure: LEFT HEART CATHETERIZATION WITH CORONARY ANGIOGRAM;  Surgeon: Lorretta Harp, MD;  Location: [facility] CATH LAB;  Service: Cardiovascular;  Laterality: N/A;  . Coronary artery bypass graft N/A 12/29/2014    Procedure: CORONARY ARTERY BYPASS GRAFTING (CABG), ON PUMP, TIMES FOUR, USING LEFT INTERNAL MAMMARY ARTERY, RIGHT GREATER SAPHENOUS VEIN HARVESTED ENDOSCOPICALLY;  Surgeon: Ivin Poot, MD;  Location: [place];  Service: Open Heart Surgery;  Laterality: N/A;  . Tee without cardioversion N/A 12/29/2014    Procedure: TRANSESOPHAGEAL ECHOCARDIOGRAM (TEE);  Surgeon: Ivin Poot, MD;   Location: [place];  Service: Open Heart Surgery;  Laterality: N/A;  . Tee without cardioversion N/A 01/28/2015    Procedure: TRANSESOPHAGEAL ECHOCARDIOGRAM (TEE);  Surgeon: Arnoldo Lenis, MD;  [facility];  Service: Endoscopy;  Laterality: N/A;  . Cardioversion N/A 01/28/2015    Procedure: CARDIOVERSION;  Surgeon: Arnoldo Lenis, MD;  Location[facility];  Service: Endoscopy;  Laterality: N/A;    Family History  Problem Relation Age of Onset  . Stroke Mother   . Hypertension Mother   . Heart disease Sister     Died from complications of RHD  . Heart disease Brother     Heart failure related to EtOH and smoking  . Stomach cancer Sister     Social History History  Substance Use Topics  . Smoking status: Never Smoker   . Smokeless tobacco: Never Used  . Alcohol Use: 0.0 oz/week    0 Standard drinks or equivalent per week     Comment: Occasional    Current Outpatient Prescriptions  Medication Sig Dispense Refill  . aspirin 81 MG tablet Take 81 mg by mouth daily.    Marland Kitchen atorvastatin (LIPITOR) 40 MG tablet Take 40 mg by mouth daily.    . clonazePAM (KLONOPIN) 0.5 MG tablet 0.5 mg at bedtime.     Marland Kitchen doxazosin (CARDURA) 4 MG tablet Take 4 mg by mouth 2 (two) times daily.    Marland Kitchen losartan (COZAAR) 25 MG tablet Take  1 tablet (25 mg total) by mouth daily. 90 tablet 3  . Testosterone Cypionate 200 MG/ML KIT Inject 400 mg into the muscle every 14 (fourteen) days.     No current facility-administered medications for this visit.    Allergies  Allergen Reactions  . Lasix [Furosemide] Nausea Only    Review of Systems  Very active around the farm No chest pain or shortness of breath  BP 100/58 mmHg  Pulse 86  Resp 16  Ht 6' (1.829 m)  Wt 259 lb (117.482 kg)  BMI 35.12 kg/m2  SpO2 96% Physical Exam Alert and comfortable Lungs clear bilaterally Sternum stable incision well-healed Heart rhythm regular without murmur or gallop  Diagnostic Tests: Chest x-ray images personally  reviewed showing clear lung fields  Impression: Excellent recovery after multivessel CABG No restrictions to activity 3 months after surgery.  Plan:return as needed. Continue current medications including aspirin and Lipitor.   Peter Van Trigt III, MD [facility] [phone]  

## 2015-04-07 NOTE — Patient Instructions (Signed)
Your physician wants you to follow-up in: 6 months with Dr. Domenic Polite.  You will receive a reminder letter in the mail two months in advance. If you don't receive a letter, please call our office to schedule the follow-up appointment.  Your physician has recommended you make the following change in your medication:   STOP TAKING: Amiodarone (Pacerone)  Thank you for choosing [facility]!

## 2015-04-09 ENCOUNTER — Ambulatory Visit (INDEPENDENT_AMBULATORY_CARE_PROVIDER_SITE_OTHER): Payer: Medicare Other | Admitting: Urology

## 2015-04-09 ENCOUNTER — Encounter (HOSPITAL_COMMUNITY)
Admission: RE | Admit: 2015-04-09 | Discharge: 2015-04-09 | Disposition: A | Discharge status: Home / Self Care | Payer: Medicare Other | Source: Ambulatory Visit | Attending: Cardiovascular Disease | Admitting: Cardiovascular Disease

## 2015-04-09 DIAGNOSIS — R3912 Poor urinary stream: Secondary | ICD-10-CM | POA: Diagnosis not present

## 2015-04-09 DIAGNOSIS — R609 Edema, unspecified: Secondary | ICD-10-CM

## 2015-04-09 DIAGNOSIS — I2510 Atherosclerotic heart disease of native coronary artery without angina pectoris: Secondary | ICD-10-CM | POA: Diagnosis not present

## 2015-04-09 DIAGNOSIS — N5201 Erectile dysfunction due to arterial insufficiency: Secondary | ICD-10-CM

## 2015-04-09 DIAGNOSIS — Z951 Presence of aortocoronary bypass graft: Secondary | ICD-10-CM | POA: Diagnosis not present

## 2015-04-09 DIAGNOSIS — E291 Testicular hypofunction: Secondary | ICD-10-CM

## 2015-04-09 DIAGNOSIS — N401 Enlarged prostate with lower urinary tract symptoms: Secondary | ICD-10-CM | POA: Diagnosis not present

## 2015-04-12 ENCOUNTER — Encounter (HOSPITAL_COMMUNITY)
Admission: RE | Admit: 2015-04-12 | Discharge: 2015-04-12 | Disposition: A | Discharge status: Home / Self Care | Payer: Medicare Other | Source: Ambulatory Visit | Attending: Cardiovascular Disease | Admitting: Cardiovascular Disease

## 2015-04-12 DIAGNOSIS — I2510 Atherosclerotic heart disease of native coronary artery without angina pectoris: Secondary | ICD-10-CM | POA: Diagnosis not present

## 2015-04-12 DIAGNOSIS — Z951 Presence of aortocoronary bypass graft: Secondary | ICD-10-CM | POA: Diagnosis not present

## 2015-04-14 ENCOUNTER — Encounter (HOSPITAL_COMMUNITY)
Admission: RE | Admit: 2015-04-14 | Discharge: 2015-04-14 | Disposition: A | Discharge status: Home / Self Care | Payer: Medicare Other | Source: Ambulatory Visit | Attending: Cardiovascular Disease | Admitting: Cardiovascular Disease

## 2015-04-14 DIAGNOSIS — Z951 Presence of aortocoronary bypass graft: Secondary | ICD-10-CM | POA: Diagnosis not present

## 2015-04-14 DIAGNOSIS — N529 Male erectile dysfunction, unspecified: Secondary | ICD-10-CM | POA: Diagnosis not present

## 2015-04-14 DIAGNOSIS — I2510 Atherosclerotic heart disease of native coronary artery without angina pectoris: Secondary | ICD-10-CM | POA: Diagnosis not present

## 2015-04-16 ENCOUNTER — Encounter (HOSPITAL_COMMUNITY)
Admission: RE | Admit: 2015-04-16 | Discharge: 2015-04-16 | Disposition: A | Discharge status: Home / Self Care | Payer: Medicare Other | Source: Ambulatory Visit | Attending: Cardiovascular Disease | Admitting: Cardiovascular Disease

## 2015-04-16 DIAGNOSIS — Z951 Presence of aortocoronary bypass graft: Secondary | ICD-10-CM | POA: Diagnosis not present

## 2015-04-16 DIAGNOSIS — I2510 Atherosclerotic heart disease of native coronary artery without angina pectoris: Secondary | ICD-10-CM | POA: Diagnosis not present

## 2015-04-19 ENCOUNTER — Encounter (HOSPITAL_COMMUNITY)
Admission: RE | Admit: 2015-04-19 | Discharge: 2015-04-19 | Disposition: A | Discharge status: Home / Self Care | Payer: Medicare Other | Source: Ambulatory Visit | Attending: Cardiovascular Disease | Admitting: Cardiovascular Disease

## 2015-04-19 DIAGNOSIS — I2510 Atherosclerotic heart disease of native coronary artery without angina pectoris: Secondary | ICD-10-CM | POA: Diagnosis not present

## 2015-04-19 DIAGNOSIS — Z951 Presence of aortocoronary bypass graft: Secondary | ICD-10-CM | POA: Diagnosis not present

## 2015-04-21 ENCOUNTER — Encounter (HOSPITAL_COMMUNITY)
Admission: RE | Admit: 2015-04-21 | Discharge: 2015-04-21 | Disposition: A | Discharge status: Home / Self Care | Payer: Medicare Other | Source: Ambulatory Visit | Attending: Cardiovascular Disease | Admitting: Cardiovascular Disease

## 2015-04-21 DIAGNOSIS — I2510 Atherosclerotic heart disease of native coronary artery without angina pectoris: Secondary | ICD-10-CM | POA: Diagnosis not present

## 2015-04-21 DIAGNOSIS — Z951 Presence of aortocoronary bypass graft: Secondary | ICD-10-CM | POA: Diagnosis not present

## 2015-04-23 ENCOUNTER — Encounter (HOSPITAL_COMMUNITY)
Admission: RE | Admit: 2015-04-23 | Discharge: 2015-04-23 | Disposition: A | Discharge status: Home / Self Care | Payer: Medicare Other | Source: Ambulatory Visit | Attending: Cardiovascular Disease | Admitting: Cardiovascular Disease

## 2015-04-23 DIAGNOSIS — I2510 Atherosclerotic heart disease of native coronary artery without angina pectoris: Secondary | ICD-10-CM | POA: Diagnosis not present

## 2015-04-23 DIAGNOSIS — Z951 Presence of aortocoronary bypass graft: Secondary | ICD-10-CM | POA: Diagnosis not present

## 2015-04-26 ENCOUNTER — Encounter (HOSPITAL_COMMUNITY)
Admission: RE | Admit: 2015-04-26 | Discharge: 2015-04-26 | Disposition: A | Discharge status: Home / Self Care | Payer: Medicare Other | Source: Ambulatory Visit | Attending: Cardiovascular Disease | Admitting: Cardiovascular Disease

## 2015-04-26 DIAGNOSIS — I2510 Atherosclerotic heart disease of native coronary artery without angina pectoris: Secondary | ICD-10-CM | POA: Diagnosis not present

## 2015-04-26 DIAGNOSIS — Z951 Presence of aortocoronary bypass graft: Secondary | ICD-10-CM | POA: Diagnosis not present

## 2015-04-28 ENCOUNTER — Encounter (HOSPITAL_COMMUNITY)
Admission: RE | Admit: 2015-04-28 | Discharge: 2015-04-28 | Disposition: A | Discharge status: Home / Self Care | Payer: Medicare Other | Source: Ambulatory Visit | Attending: Cardiovascular Disease | Admitting: Cardiovascular Disease

## 2015-04-28 DIAGNOSIS — Z951 Presence of aortocoronary bypass graft: Secondary | ICD-10-CM | POA: Diagnosis not present

## 2015-04-28 DIAGNOSIS — I2510 Atherosclerotic heart disease of native coronary artery without angina pectoris: Secondary | ICD-10-CM | POA: Diagnosis not present

## 2015-04-30 ENCOUNTER — Encounter (HOSPITAL_COMMUNITY): Payer: Medicare Other

## 2015-05-05 DIAGNOSIS — N529 Male erectile dysfunction, unspecified: Secondary | ICD-10-CM | POA: Diagnosis not present

## 2015-05-05 NOTE — Progress Notes (Signed)
Patient is discharged from [facility] today, April 28, 2015 with 36 sessions.  He achieved LTG of 30 minutes of aerobic exercise at max met level of 3.74.  All patient vitals are WNL.  Patient did not meet with dietician.  Discharge instructions have been reviewed in detail and patient expressed an understanding of material given.  Patient plans to exercise at home and possibly join the maintenance program later. Cardiac Rehab will make 1 month, 6 month and 1 year call backs.  Patient had no complaints of any abnormal S/S or pain on their exit visit.  Patient finished post six walk test.   

## 2015-05-05 NOTE — Progress Notes (Signed)
Cardiac Rehabilitation Program Outcomes Report   Orientation:  01/14/15 Graduate Date:  04/28/15 Discharge Date:  04/28/15 # of sessions completed: 36  Cardiologist: Bronson Ing Family MD:  Homero Fellers Time:  1100  A.  Exercise Program:  Tolerates exercise @ 3.74 METS for 15 minutes, Walk Test Results:  Post: 3.32 mets, Improved functional capacity  24.3 % and Improved  muscular strength  4.61 %  B.  Mental Health:  Good mental attitude  C.  Education/Instruction/Skills  Accurately checks own pulse.  Rest:  89  Exercise:  121, Knows THR for exercise, Uses Perceived Exertion Scale and/or Dyspnea Scale and Attended 12 education classes  DC instructions given 04/28/15  D.  Nutrition/Weight Control/Body Composition:  Adherence to prescribed nutrition program: good , Patient weight change: -4.3% and Evidence of fat weight loss   E.  Blood Lipids    Lab Results  Component Value Date   CHOL 171 12/26/2014   HDL 31* 12/26/2014   LDLCALC 116* 12/26/2014   TRIG 120 12/26/2014   CHOLHDL 5.5 12/26/2014    F.  Lifestyle Changes:  Making positive lifestyle changes  G.  Symptoms noted with exercise:  Asymptomatic  Report Completed By:  Stevphen Rochester RN   Comments:  Patient has graduated [facility] today.  Patient has done very well in the program.  Patient plans to exercise at home with joining the maintenance program later being a possibility.

## 2015-06-09 DIAGNOSIS — N529 Male erectile dysfunction, unspecified: Secondary | ICD-10-CM | POA: Diagnosis not present

## 2015-06-11 IMAGING — CR DG CHEST 2V
2 series · 2 of 2 positions shown · non-contrast
Comparison: Chest x-ray of January 23, 2015

CLINICAL DATA: Status post CABG 5 weeks ago, cardioversion 1 week
ago, shortness of breath.

EXAM:
CHEST  2 VIEW

[w chest pa]
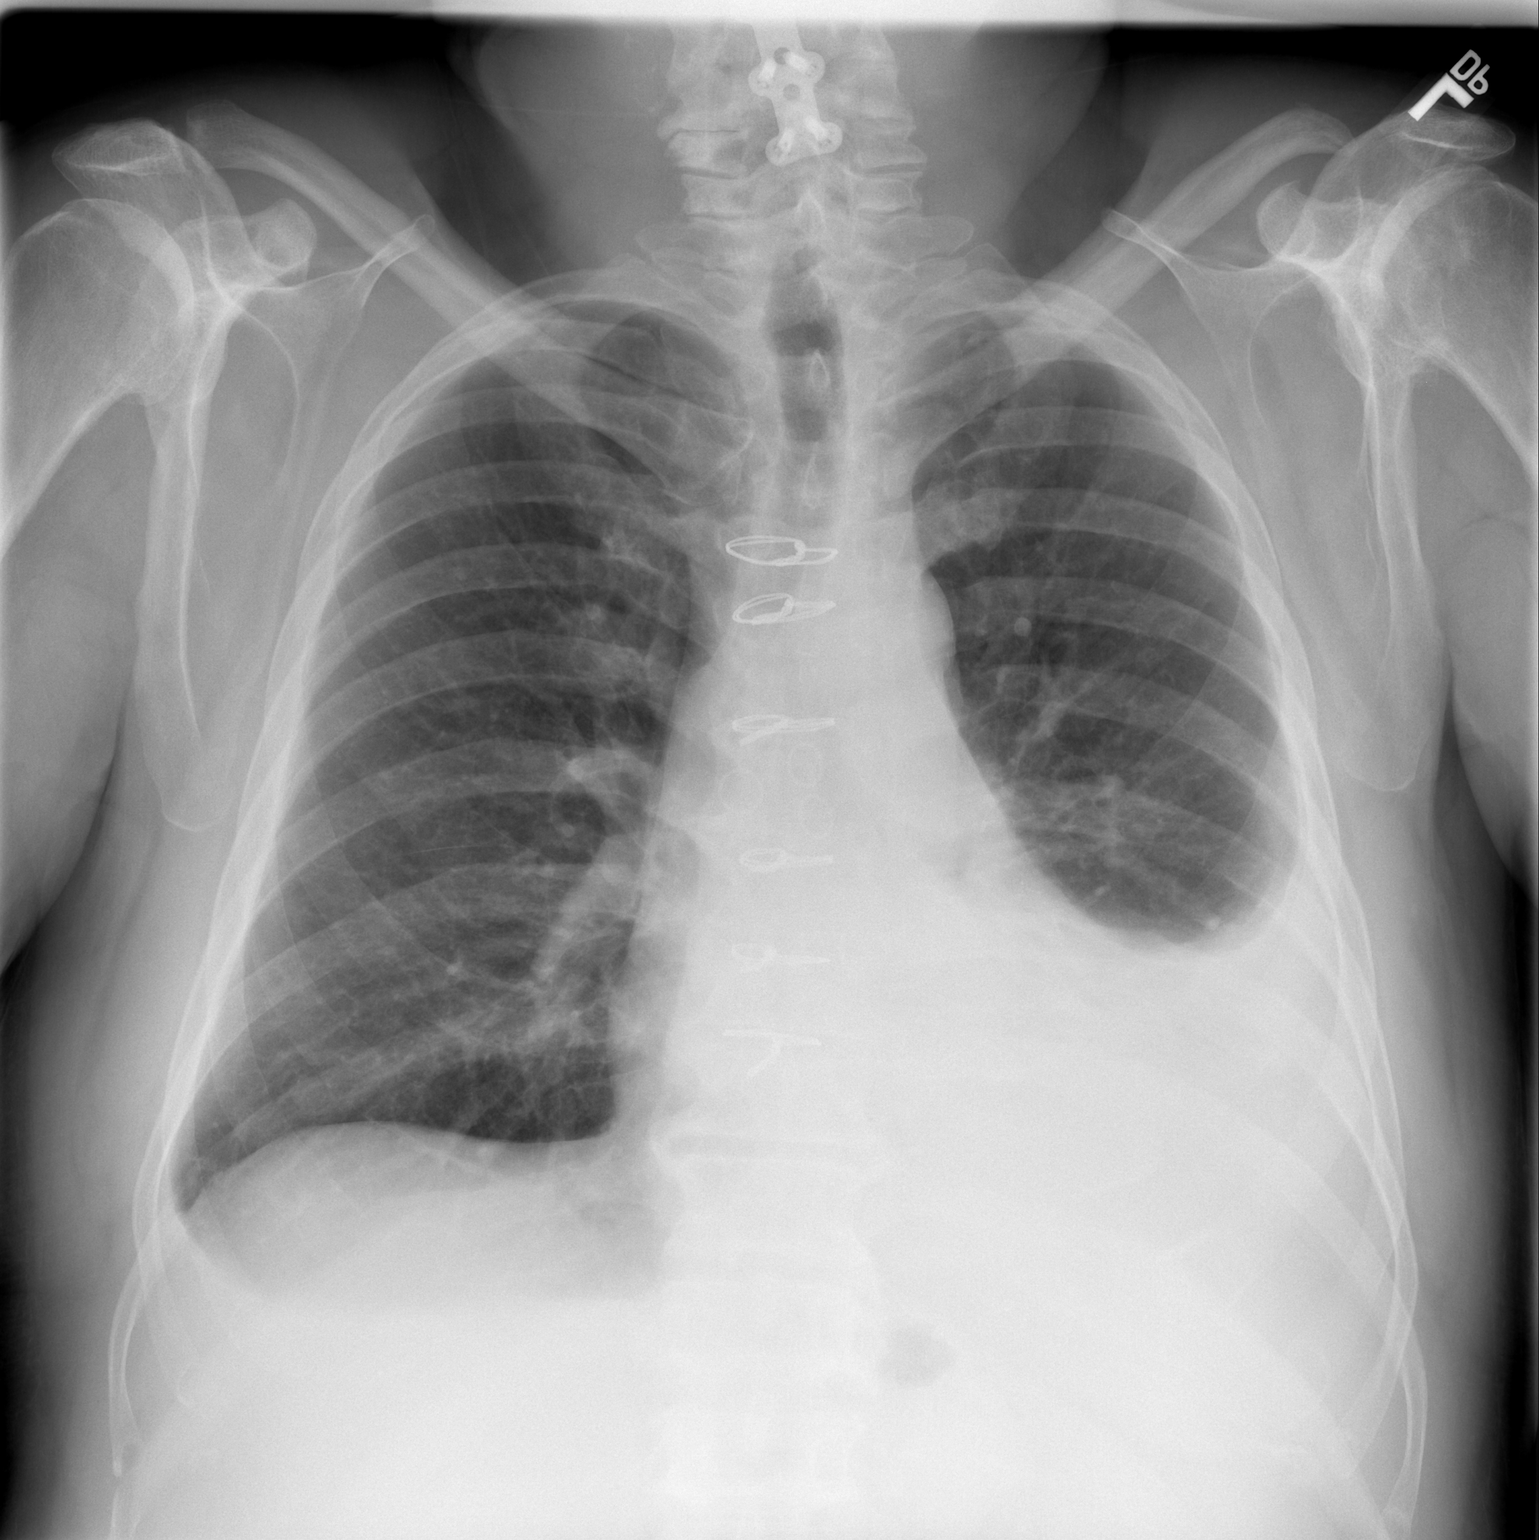

[w chest lat]
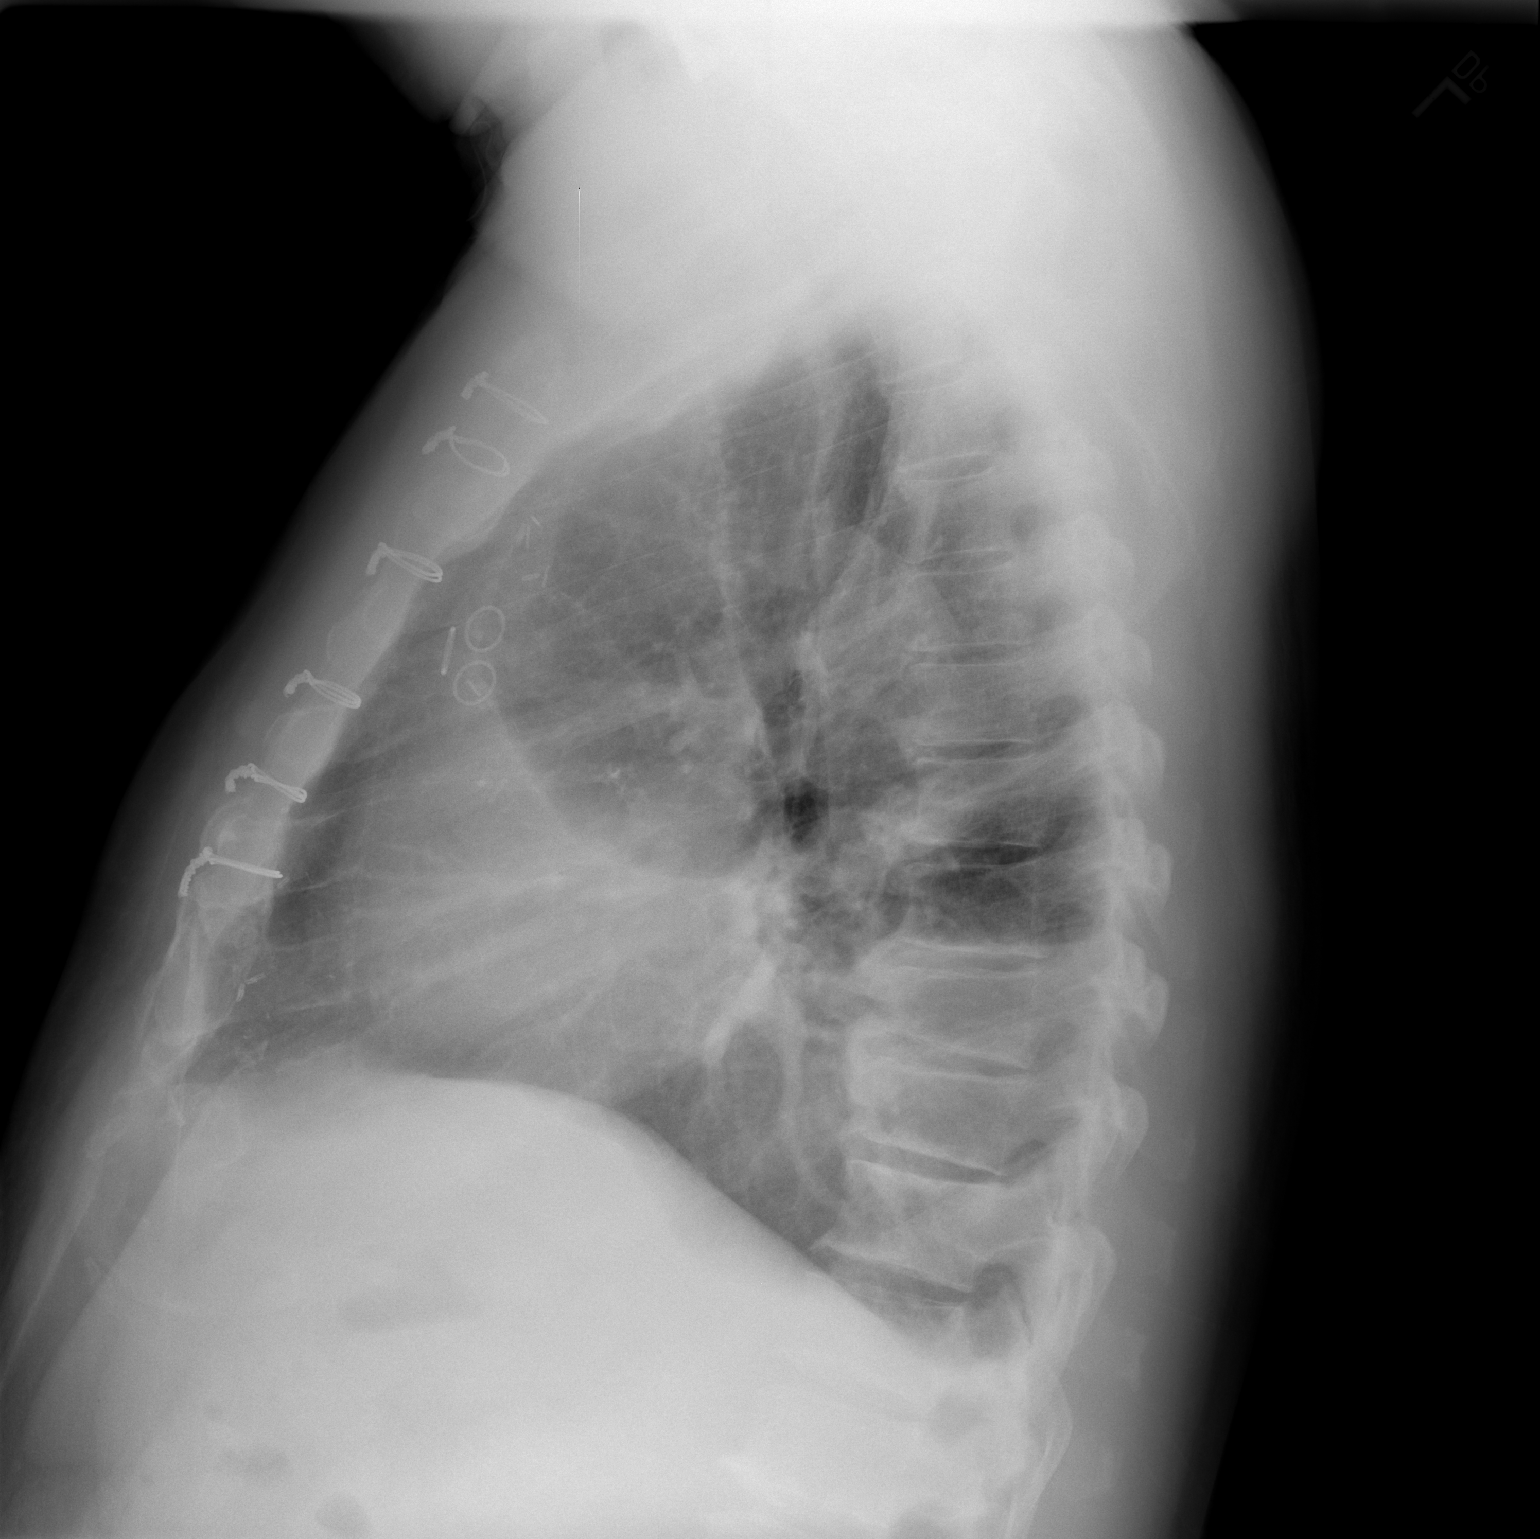

[2 of 2 positions shown; findings below may reference images not displayed]

FINDINGS: There has been interval increase in the volume of the pleural
effusions. There is a moderate-sized effusion now on the left with
small effusion on the right. There is no mediastinal shift. The
cardiac silhouette is normal in size. The pulmonary vascularity is
not engorged. There are 6 intact sternal wires. The mediastinum is
normal in width.
IMPRESSION: Interval increase in the size of the pleural effusions especially on
the left where the effusion is now moderate in volume. There is no
pulmonary edema.

## 2015-06-16 IMAGING — CR DG CHEST 2V
2 series · 2 of 2 positions shown · non-contrast
Comparison: 02/03/2015

CLINICAL DATA: Pleural effusion, left side. No shortness of breath.
History of CABG 12/29/2014. Follow-up is not last week showed left
pleural effusion. History of hypertension. Nonsmoker.

EXAM:
CHEST  2 VIEW

[w chest pa]
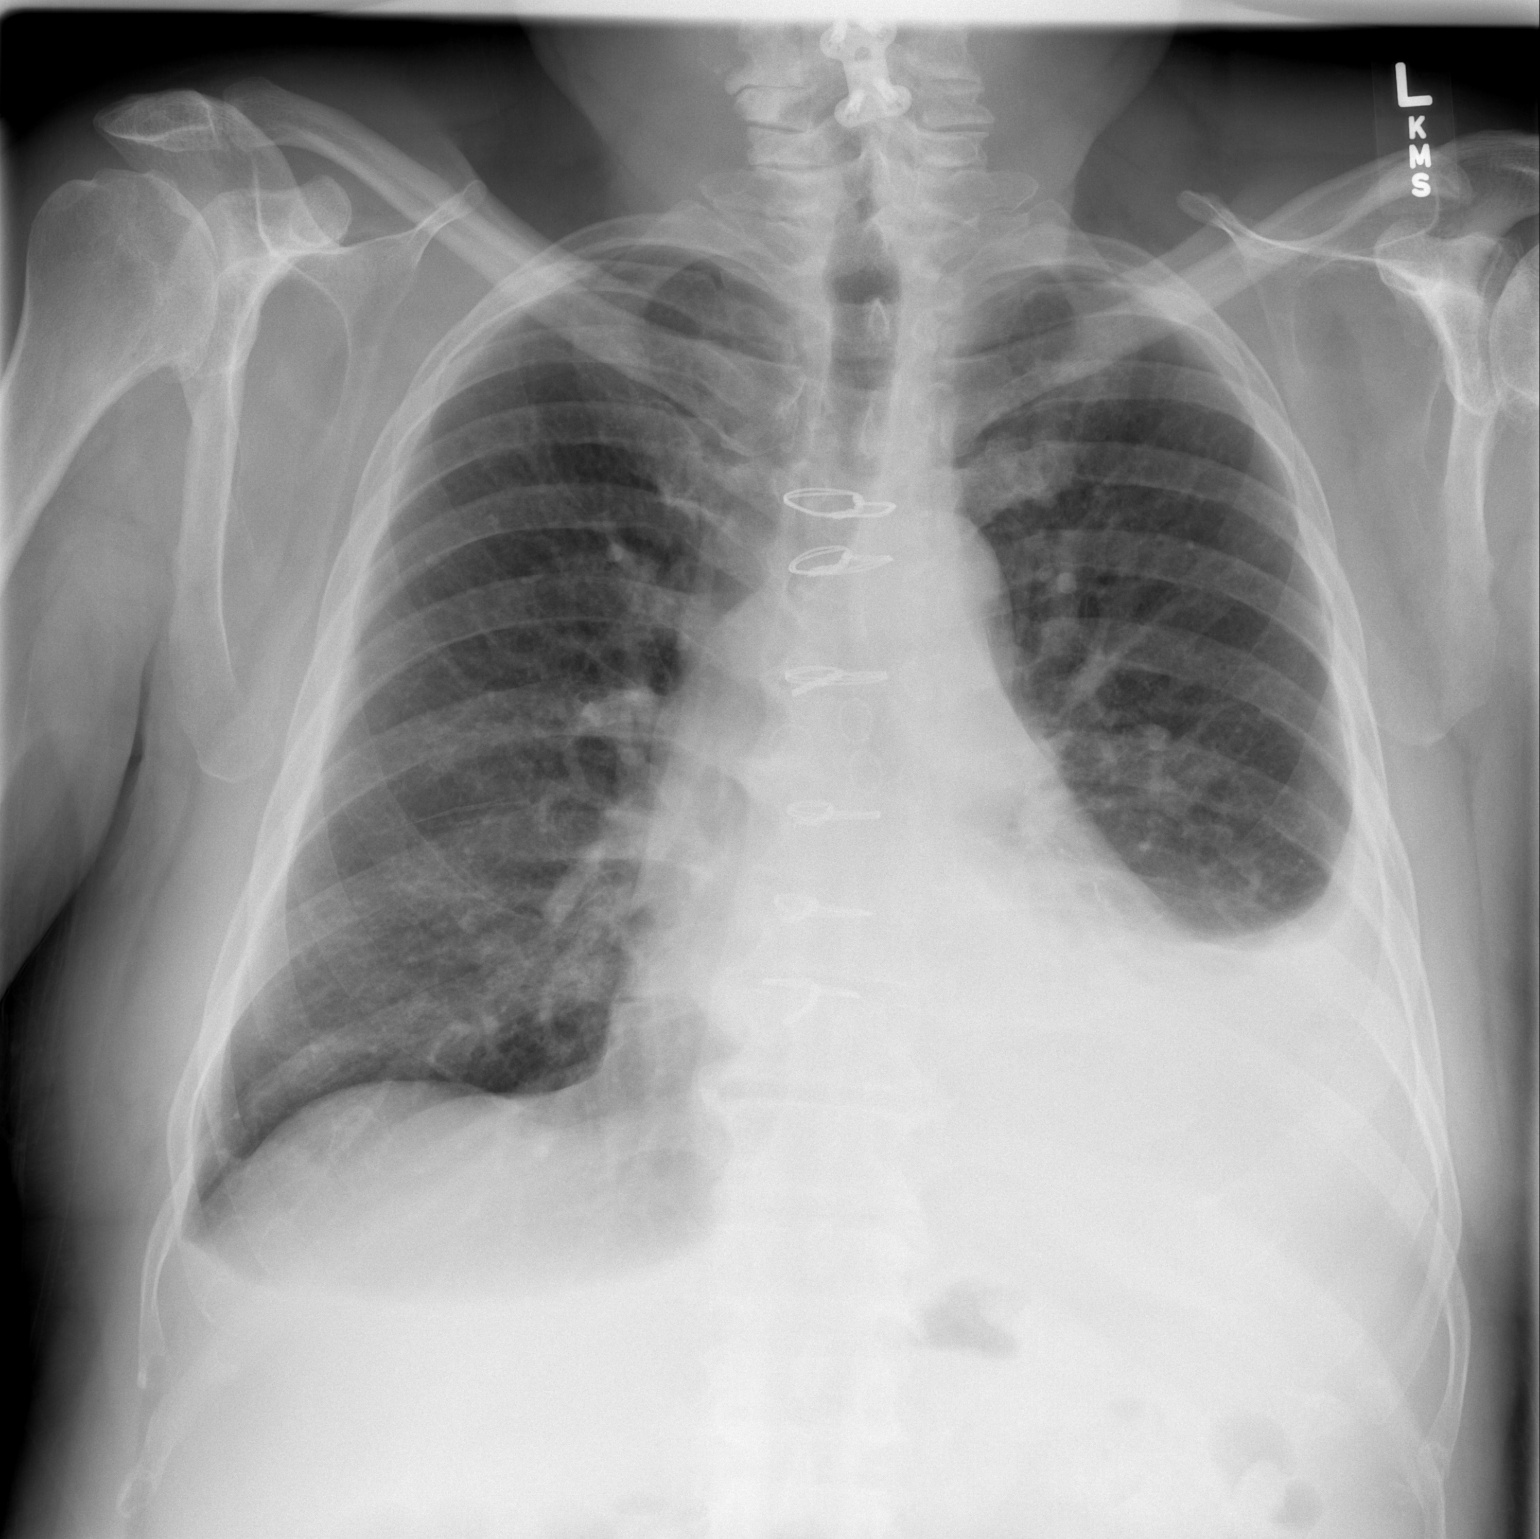

[w chest lat]
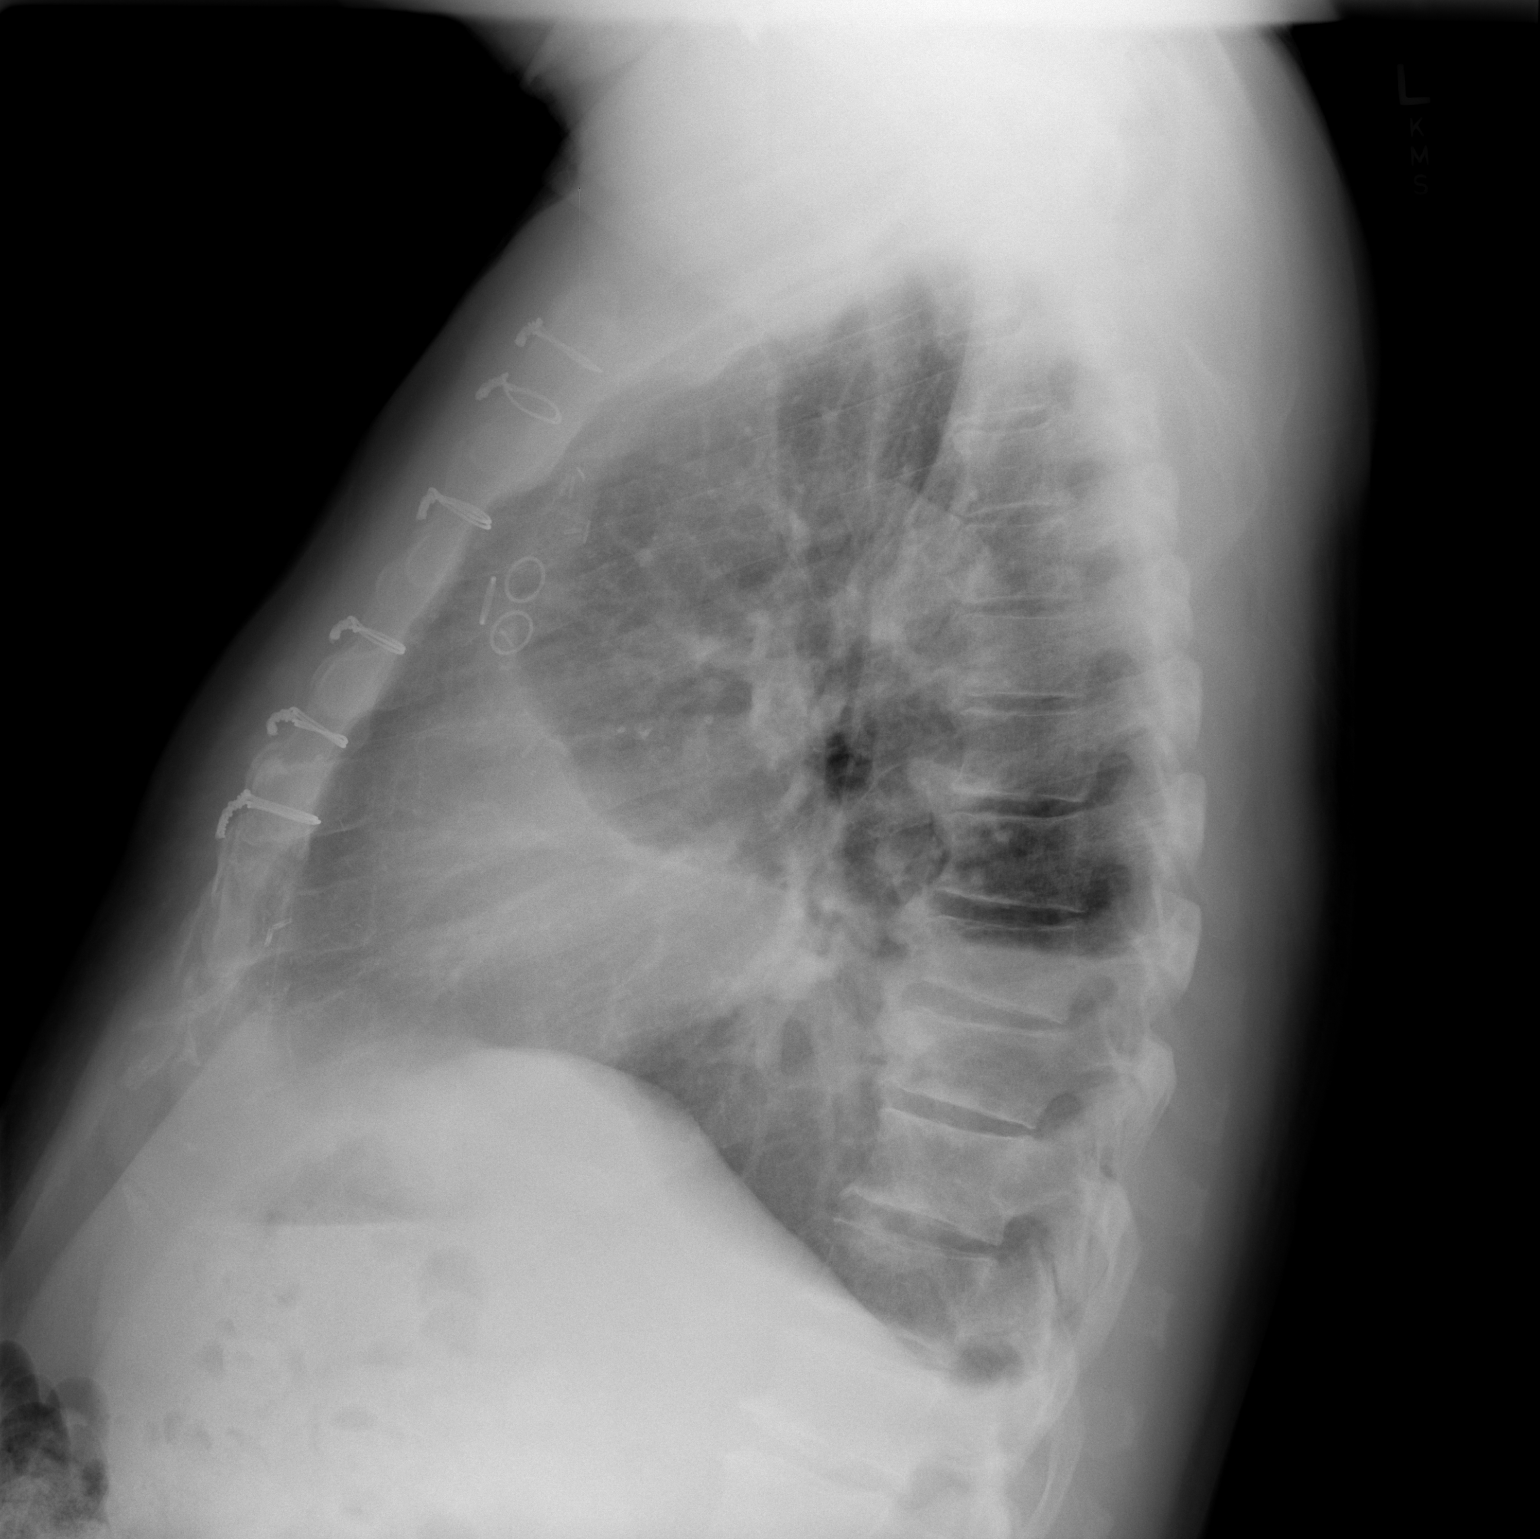

[2 of 2 positions shown; findings below may reference images not displayed]

FINDINGS: Status post median sternotomy and CABG. There is persistent
left-sided pleural effusion and associated basilar opacity,
unchanged in appearance. Small right pleural effusion is also noted.
There is no pulmonary edema or pneumothorax.
IMPRESSION: Stable bilateral pleural effusions, left greater than right.

## 2015-06-23 DIAGNOSIS — N529 Male erectile dysfunction, unspecified: Secondary | ICD-10-CM | POA: Diagnosis not present

## 2015-07-02 IMAGING — CR DG CHEST 2V
2 series · 2 of 2 positions shown · non-contrast
Comparison: 02/08/2015

CLINICAL DATA: Post CABG.  Anterior left chest burning.

EXAM:
CHEST  2 VIEW

[view not recorded (1 of 2)]
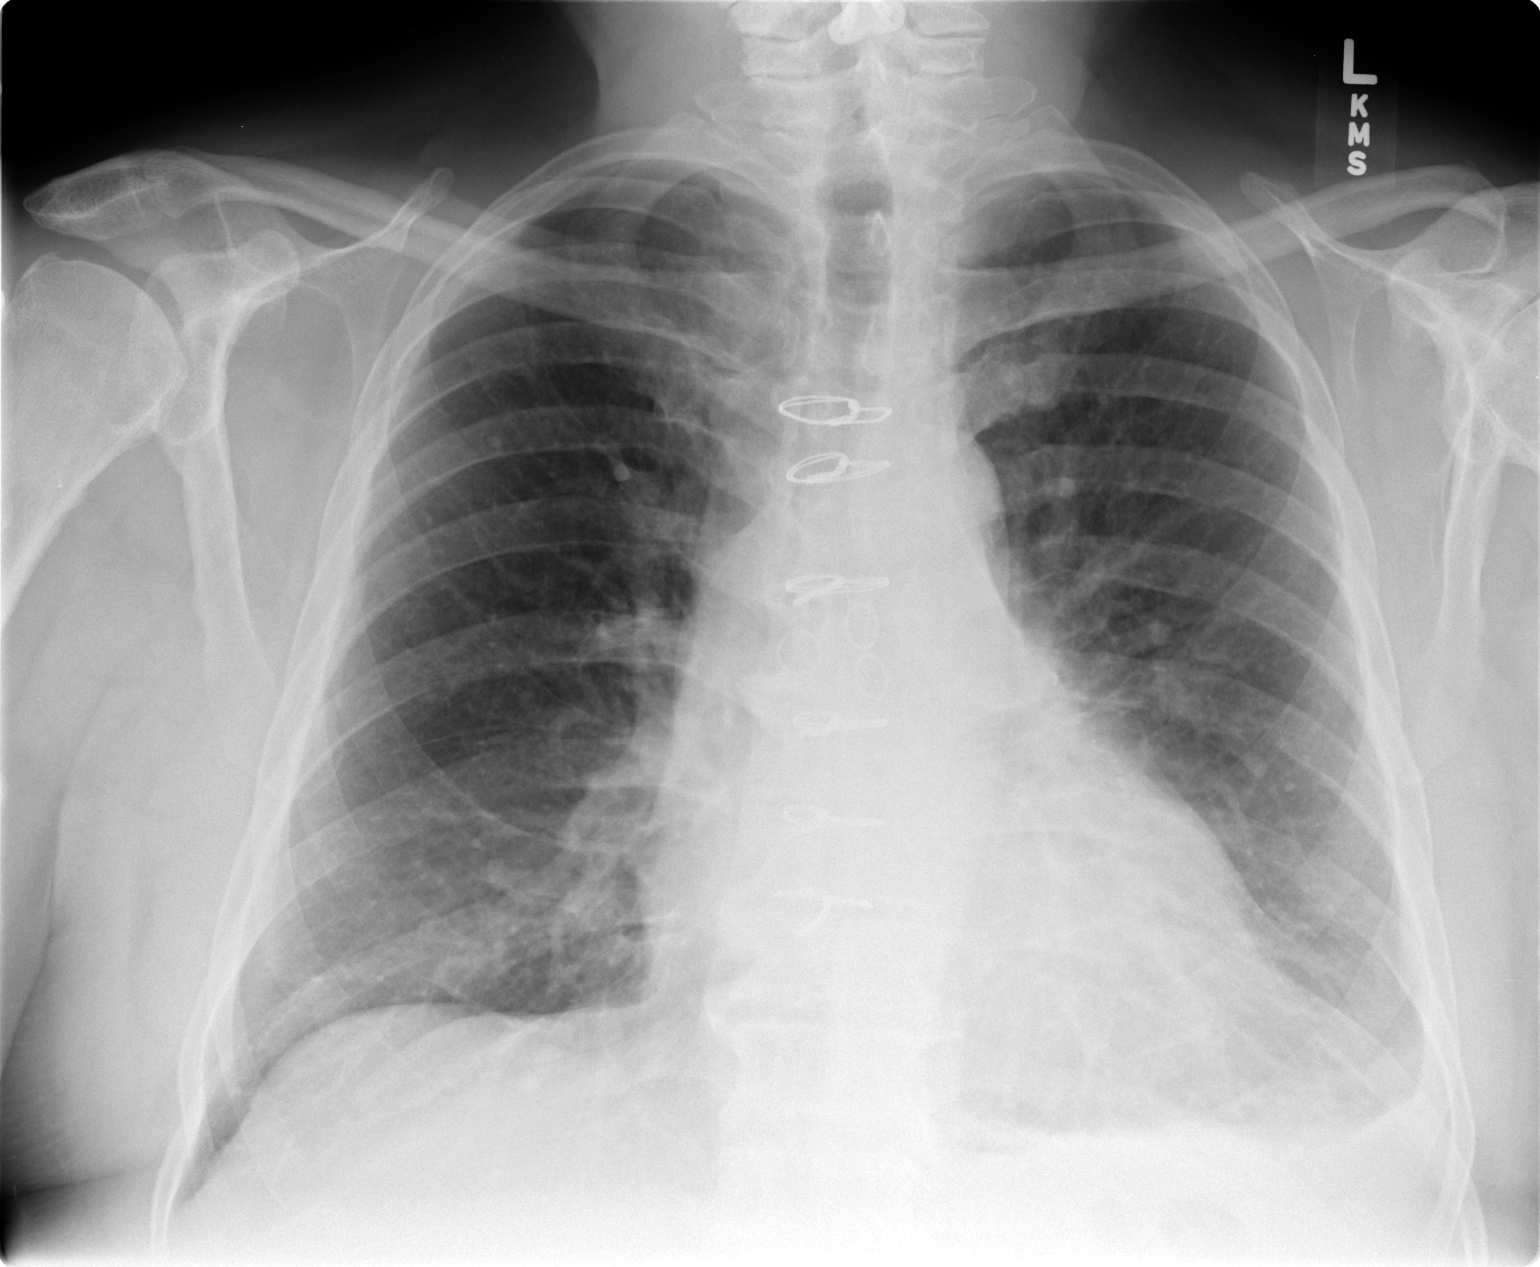

[view not recorded (2 of 2)]
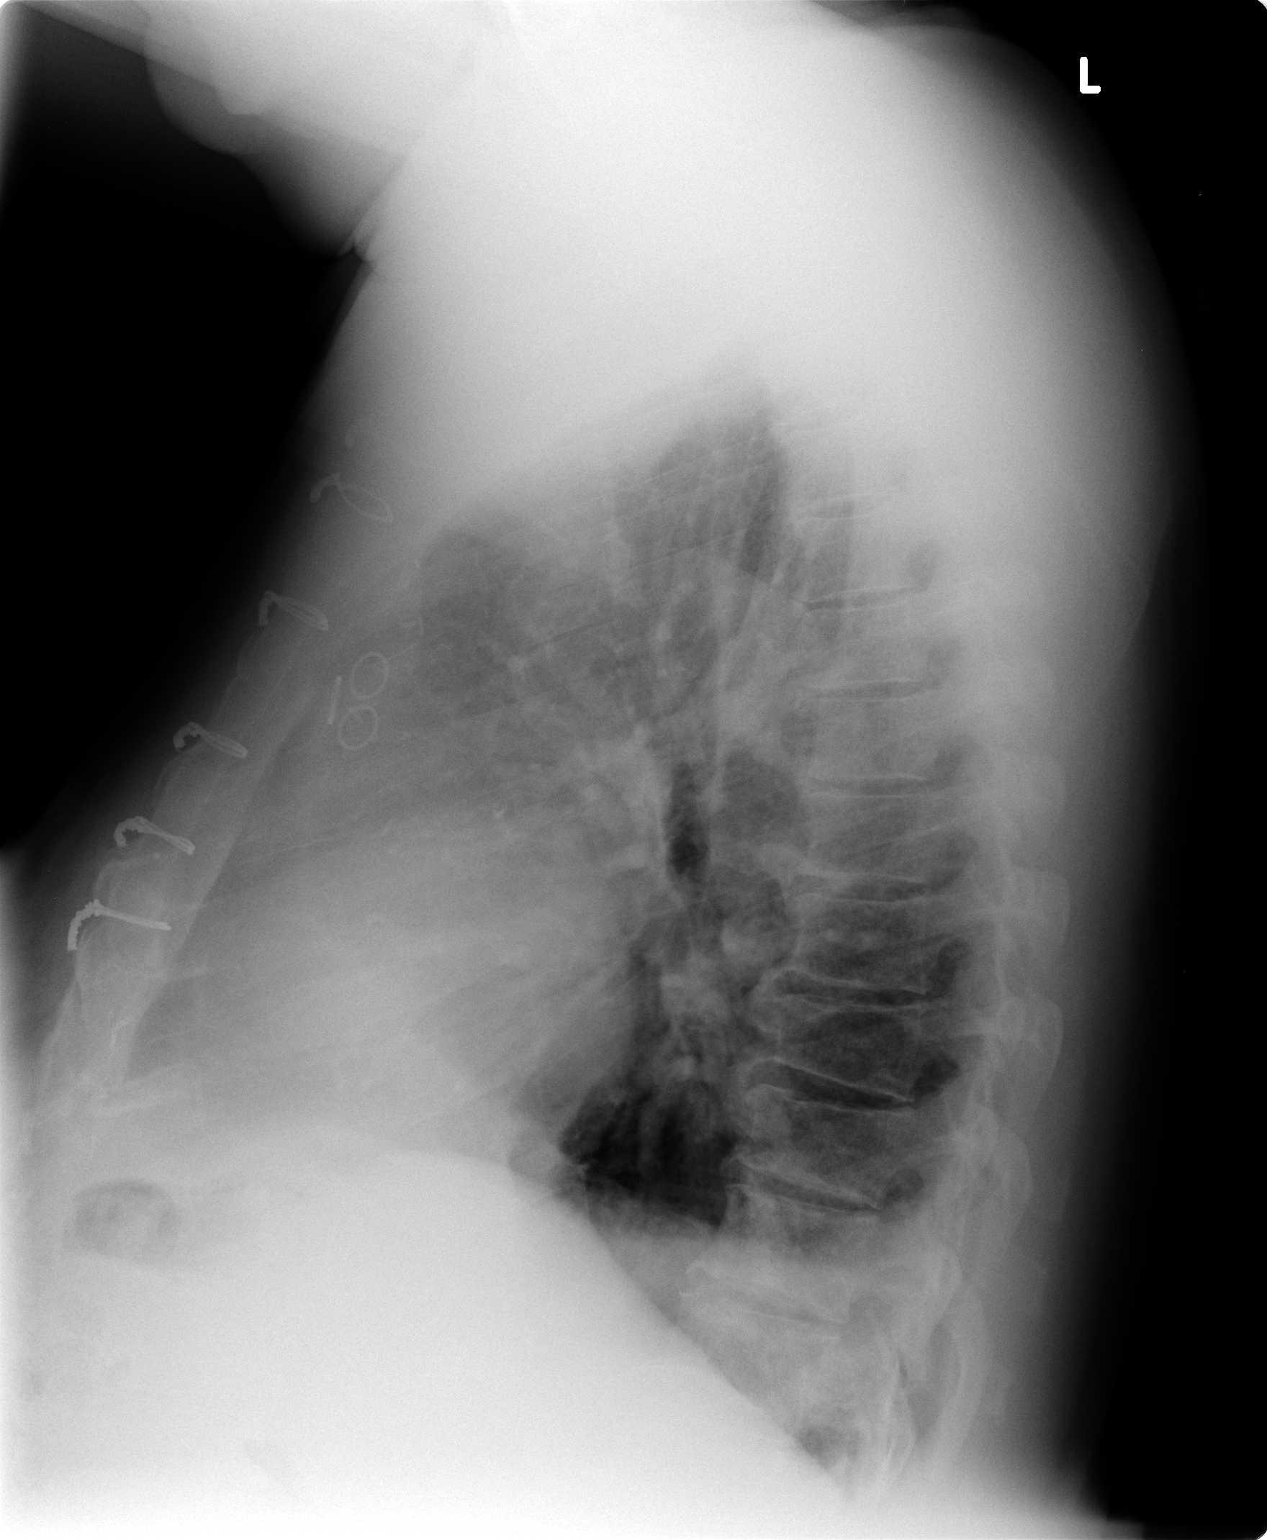

[2 of 2 positions shown; findings below may reference images not displayed]

FINDINGS: Prior CABG. Decreasing scratch has small left effusion, decreased
since prior study. No confluent airspace opacities. Heart is
borderline in size. No pneumothorax. No acute bony abnormality.
IMPRESSION: Decreasing left pleural effusion and improving aeration in the left
base.

## 2015-07-06 DIAGNOSIS — Z79899 Other long term (current) drug therapy: Secondary | ICD-10-CM | POA: Diagnosis not present

## 2015-07-06 DIAGNOSIS — E785 Hyperlipidemia, unspecified: Secondary | ICD-10-CM | POA: Diagnosis not present

## 2015-07-07 DIAGNOSIS — N529 Male erectile dysfunction, unspecified: Secondary | ICD-10-CM | POA: Diagnosis not present

## 2015-07-13 DIAGNOSIS — E785 Hyperlipidemia, unspecified: Secondary | ICD-10-CM | POA: Diagnosis not present

## 2015-07-13 DIAGNOSIS — I10 Essential (primary) hypertension: Secondary | ICD-10-CM | POA: Diagnosis not present

## 2015-07-13 DIAGNOSIS — Z6835 Body mass index (BMI) 35.0-35.9, adult: Secondary | ICD-10-CM | POA: Diagnosis not present

## 2015-07-13 DIAGNOSIS — Z23 Encounter for immunization: Secondary | ICD-10-CM | POA: Diagnosis not present

## 2015-07-21 DIAGNOSIS — E52 Niacin deficiency [pellagra]: Secondary | ICD-10-CM | POA: Diagnosis not present

## 2015-08-04 DIAGNOSIS — N529 Male erectile dysfunction, unspecified: Secondary | ICD-10-CM | POA: Diagnosis not present

## 2015-08-05 ENCOUNTER — Other Ambulatory Visit: Payer: Self-pay | Admitting: Physician Assistant

## 2015-08-13 DIAGNOSIS — Z6835 Body mass index (BMI) 35.0-35.9, adult: Secondary | ICD-10-CM | POA: Diagnosis not present

## 2015-08-13 DIAGNOSIS — J440 Chronic obstructive pulmonary disease with acute lower respiratory infection: Secondary | ICD-10-CM | POA: Diagnosis not present

## 2015-08-18 DIAGNOSIS — N529 Male erectile dysfunction, unspecified: Secondary | ICD-10-CM | POA: Diagnosis not present

## 2015-09-08 DIAGNOSIS — N529 Male erectile dysfunction, unspecified: Secondary | ICD-10-CM | POA: Diagnosis not present

## 2015-09-21 DIAGNOSIS — E291 Testicular hypofunction: Secondary | ICD-10-CM | POA: Diagnosis not present

## 2015-09-22 DIAGNOSIS — N529 Male erectile dysfunction, unspecified: Secondary | ICD-10-CM | POA: Diagnosis not present

## 2015-10-01 ENCOUNTER — Ambulatory Visit (INDEPENDENT_AMBULATORY_CARE_PROVIDER_SITE_OTHER): Payer: Medicare Other | Admitting: Urology

## 2015-10-01 DIAGNOSIS — N5201 Erectile dysfunction due to arterial insufficiency: Secondary | ICD-10-CM

## 2015-10-01 DIAGNOSIS — N401 Enlarged prostate with lower urinary tract symptoms: Secondary | ICD-10-CM

## 2015-10-01 DIAGNOSIS — R3912 Poor urinary stream: Secondary | ICD-10-CM

## 2015-10-01 DIAGNOSIS — E291 Testicular hypofunction: Secondary | ICD-10-CM

## 2015-10-06 DIAGNOSIS — N529 Male erectile dysfunction, unspecified: Secondary | ICD-10-CM | POA: Diagnosis not present

## 2015-10-08 ENCOUNTER — Ambulatory Visit (INDEPENDENT_AMBULATORY_CARE_PROVIDER_SITE_OTHER): Payer: Medicare Other | Admitting: Cardiology

## 2015-10-08 ENCOUNTER — Encounter: Payer: Self-pay | Admitting: Cardiology

## 2015-10-08 VITALS — BP 108/58 | HR 78 | Ht 72.0 in | Wt 261.0 lb

## 2015-10-08 DIAGNOSIS — I10 Essential (primary) hypertension: Secondary | ICD-10-CM

## 2015-10-08 DIAGNOSIS — E782 Mixed hyperlipidemia: Secondary | ICD-10-CM

## 2015-10-08 DIAGNOSIS — I2510 Atherosclerotic heart disease of native coronary artery without angina pectoris: Secondary | ICD-10-CM

## 2015-10-08 MED ORDER — ATORVASTATIN CALCIUM 20 MG PO TABS: 20.0000 mg | ORAL_TABLET | Freq: Every day | ORAL | Status: DC

## 2015-10-08 NOTE — Patient Instructions (Signed)
Your physician wants you to follow-up in: 6 months with Dr Ferne Reus will receive a reminder letter in the mail two months in advance. If you don't receive a letter, please call our office to schedule the follow-up appointment.    DECREASE Lipitor to 20 mg at dinner   If you need a refill on your cardiac medications before your next appointment, please call your pharmacy.     Thank you for choosing [facility] !

## 2015-10-08 NOTE — Progress Notes (Signed)
Cardiology Office Note  Date: 10/08/2015   ID: Devin [name], DOB 12-06-1945, MRN [id number]  PCP: [name], MD  Primary Cardiologist: Rozann Lesches, MD   Chief Complaint  Patient presents with  . Coronary Artery Disease   History of Present Illness: Devin [name] is a 69 y.o. male last seen in June. He presents for a routine follow-up visit. Reports no angina symptoms, stable NYHA class II dyspnea. 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 Does have soreness in his sternal site, sounds like paresthesias at times. This was worse after he had a lot of coughing associated with a URI. He has had no erythema, fevers chills, or drainage.  He has been off of Xarelto and amiodarone following prior cardioversion of atrial flutter in the postoperative setting. There have been no obvious recurrences, and he has been maintained on aspirin. Reports no palpitations.  Review his follow-up lab work from September, LDL was down to 45. He is concerned that Lipitor is giving him some side effects, he has intermittent nausea. We have discussed cutting the dose down further since his lipid numbers of been so low.  Blood pressure is low normal today. He reports no problems with Cozaar.  Past Medical History  Diagnosis Date  . Essential hypertension   . Prostatic hypertrophy   . Melanoma of back ([place])   . Asbestosis ([place])   . Sleep apnea     Intolerant of CPAP  . GERD (gastroesophageal reflux disease)   . Arthritis   . 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    Current Outpatient Prescriptions  Medication Sig Dispense Refill  . aspirin 81 MG tablet Take 81 mg by mouth daily.    . clonazePAM (KLONOPIN) 0.5 MG tablet 0.5 mg at bedtime.     Marland Kitchen doxazosin (CARDURA) 4 MG tablet Take 4 mg by mouth 2 (two) times daily.    Marland Kitchen HYDROcodone-homatropine (HYCODAN) 5-1.5 MG/5ML syrup Take 5 mLs by mouth every 6 (six) hours  as needed.     Marland Kitchen losartan (COZAAR) 25 MG tablet Take 1 tablet (25 mg total) by mouth daily. 90 tablet 3  . Testosterone Cypionate 200 MG/ML KIT Inject 400 mg into the muscle every 14 (fourteen) days.    Marland Kitchen atorvastatin (LIPITOR) 20 MG tablet Take 1 tablet (20 mg total) by mouth daily. 90 tablet 3   No current facility-administered medications for this visit.   Allergies:  Lasix   Social History: The patient  reports that he has never smoked. He has never used smokeless tobacco. He reports that he drinks alcohol. He reports that he does not use illicit drugs.   ROS:  Please see the history of present illness. Otherwise, complete review of systems is positive for intermittent cough improving.  All other systems are reviewed and negative.   Physical Exam: VS:  BP 108/58 mmHg  Pulse 78  Ht 6' (1.829 m)  Wt 261 lb (118.389 kg)  BMI 35.39 kg/m2  SpO2 95%, BMI Body mass index is 35.39 kg/(m^2).  Wt Readings from Last 3 Encounters:  10/08/15 261 lb (118.389 kg)  04/07/15 259 lb (117.482 kg)  04/07/15 259 lb (117.482 kg)    General: Obese male, appears comfortable at rest. HEENT: Conjunctiva and lids normal, oropharynx clear with moist mucosa. Neck: Supple, no elevated JVP or carotid bruits, no thyromegaly. Lungs: Decreased  breath sounds at the bases, nonlabored breathing at rest. Thorax: Well-healed sternal incision. Scar is prominent, but no direct tenderness, erythema, or drainage. Cardiac: Regular rate and rhythm, no S3 or significant systolic murmur, no pericardial rub. Abdomen: Soft, nontender, bowel sounds present, no guarding or rebound. Extremities: Trace ankle edema, distal pulses 1-2+.  ECG: Tracing from 04/08/2015 showed sinus rhythm with right bundle branch block and left anterior fascicular block.  Recent Labwork: 01/15/2015: Pro B Natriuretic peptide (BNP) 525.90* 01/23/2015: ALT 18; AST 13; TSH 1.608 01/25/2015: Magnesium 2.1 01/26/2015: Hemoglobin 13.5; Platelets  154 01/28/2015: BUN 16; Creatinine, Ser 1.16; Potassium 4.3; Sodium 138     Component Value Date/Time   CHOL 171 12/26/2014 0245   TRIG 120 12/26/2014 0245   HDL 31* 12/26/2014 0245   CHOLHDL 5.5 12/26/2014 0245   VLDL 24 12/26/2014 0245   LDLCALC 116* 12/26/2014 0245  September 2016: Cholesterol 95, triglycerides 51, HDL 40, LDL 45  Other Studies Reviewed Today:  Transesophageal echocardiogram 01/28/2015: Study Conclusions  - Left ventricle: The cavity size was normal. Wall thickness was normal. Systolic function was normal. The estimated ejection fraction was in the range of 55% to 60%. No evidence of thrombus. - Left atrium: The atrium was mildly dilated. No evidence of thrombus in the atrial cavity or appendage. No evidence of thrombus in the atrial cavity or appendage. - Atrial septum: No defect or patent foramen ovale was identified.  Impressions:  - No evidence of intracardiac thrombus. Will proceed with cardioversion.  Assessment and Plan:  1. Multivessel CAD status post CABG in March of this year. He is doing well symptomatically. Plan is to continue medical therapy and observation.  2. Essential hypertension, blood pressure is low normal today. No changes to current regimen.  3. Hyperlipidemia, low lipid numbers noted with LDL 45 in September. Reduce Lipitor to 20 mg daily given concerns about side effects.  Current medicines were reviewed with the patient today.  Disposition: FU with me in 6 months.   Signed, Satira Sark, MD, [facility] 10/08/2015 10:21 AM    [facility] [place]. [address], [place], [place] [address] Phone: [phone]; Fax: [phone]

## 2015-10-20 DIAGNOSIS — I2510 Atherosclerotic heart disease of native coronary artery without angina pectoris: Secondary | ICD-10-CM | POA: Diagnosis not present

## 2015-10-20 DIAGNOSIS — R05 Cough: Secondary | ICD-10-CM | POA: Diagnosis not present

## 2015-10-20 DIAGNOSIS — I10 Essential (primary) hypertension: Secondary | ICD-10-CM | POA: Diagnosis not present

## 2015-10-27 DIAGNOSIS — N529 Male erectile dysfunction, unspecified: Secondary | ICD-10-CM | POA: Diagnosis not present

## 2015-11-29 DIAGNOSIS — R05 Cough: Secondary | ICD-10-CM | POA: Diagnosis not present

## 2015-11-29 DIAGNOSIS — Z6835 Body mass index (BMI) 35.0-35.9, adult: Secondary | ICD-10-CM | POA: Diagnosis not present

## 2016-02-22 ENCOUNTER — Encounter: Payer: Self-pay | Admitting: Cardiology

## 2016-02-22 DIAGNOSIS — I10 Essential (primary) hypertension: Secondary | ICD-10-CM | POA: Diagnosis not present

## 2016-02-22 DIAGNOSIS — I2510 Atherosclerotic heart disease of native coronary artery without angina pectoris: Secondary | ICD-10-CM | POA: Diagnosis not present

## 2016-03-24 DIAGNOSIS — N401 Enlarged prostate with lower urinary tract symptoms: Secondary | ICD-10-CM | POA: Diagnosis not present

## 2016-03-24 DIAGNOSIS — E291 Testicular hypofunction: Secondary | ICD-10-CM | POA: Diagnosis not present

## 2016-03-31 ENCOUNTER — Ambulatory Visit (INDEPENDENT_AMBULATORY_CARE_PROVIDER_SITE_OTHER): Payer: Medicare Other | Admitting: Urology

## 2016-03-31 DIAGNOSIS — E291 Testicular hypofunction: Secondary | ICD-10-CM | POA: Diagnosis not present

## 2016-03-31 DIAGNOSIS — N401 Enlarged prostate with lower urinary tract symptoms: Secondary | ICD-10-CM | POA: Diagnosis not present

## 2016-03-31 DIAGNOSIS — N5201 Erectile dysfunction due to arterial insufficiency: Secondary | ICD-10-CM

## 2016-03-31 DIAGNOSIS — R3912 Poor urinary stream: Secondary | ICD-10-CM | POA: Diagnosis not present

## 2016-04-18 ENCOUNTER — Ambulatory Visit: Payer: Medicare Other | Admitting: Cardiology

## 2016-05-03 ENCOUNTER — Ambulatory Visit: Payer: Medicare Other | Admitting: Cardiology

## 2016-05-18 ENCOUNTER — Ambulatory Visit (INDEPENDENT_AMBULATORY_CARE_PROVIDER_SITE_OTHER): Payer: Medicare Other | Admitting: Cardiology

## 2016-05-18 ENCOUNTER — Encounter: Payer: Self-pay | Admitting: Cardiology

## 2016-05-18 VITALS — BP 122/70 | HR 95 | Ht 72.0 in | Wt 258.0 lb

## 2016-05-18 DIAGNOSIS — Z8679 Personal history of other diseases of the circulatory system: Secondary | ICD-10-CM | POA: Diagnosis not present

## 2016-05-18 DIAGNOSIS — G4733 Obstructive sleep apnea (adult) (pediatric): Secondary | ICD-10-CM

## 2016-05-18 DIAGNOSIS — E782 Mixed hyperlipidemia: Secondary | ICD-10-CM | POA: Diagnosis not present

## 2016-05-18 DIAGNOSIS — I2510 Atherosclerotic heart disease of native coronary artery without angina pectoris: Secondary | ICD-10-CM | POA: Diagnosis not present

## 2016-05-18 NOTE — Patient Instructions (Signed)
Your physician wants you to follow-up in: 6 months with Dr Ferne Reus will receive a reminder letter in the mail two months in advance. If you don't receive a letter, please call our office to schedule the follow-up appointment.    HOLD Lipitor,let us know how constipation is     If you need a refill on your cardiac medications before your next appointment, please call your pharmacy.      Thank you for choosing [facility] !

## 2016-05-18 NOTE — Progress Notes (Signed)
.    Cardiology Office Note  Date: 05/18/2016   ID: Devin [name], DOB [birth year], MRN [id number]  PCP: [name], MD  Primary Cardiologist: Rozann Lesches, MD   Chief Complaint  Patient presents with  . Coronary Artery Disease    History of Present Illness: Devin [name] is a 70 y.o. male last seen in December 2016. He presents for a routine follow-up visit. Reports no angina symptoms. Still has various musculoskeletal symptoms in his thorax following surgery. 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  He reports having trouble with constipation, thinks that it might be related to Lipitor. Dose has been cut back to 10 mg daily since I saw him. Still no improvement. LDL 45 at last check.  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  Blood pressure is normal today. He does have some episodes of dizziness and states his blood pressure is lower at times. Could be related to variation in hydration status. He is also on Cardura for prostatism.  Past Medical History  Diagnosis Date  . Essential hypertension   . Prostatic hypertrophy   . Melanoma of back ([place])   . Asbestosis ([place])   . Sleep apnea     Intolerant of CPAP  . GERD (gastroesophageal reflux disease)   . Arthritis   . 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    Past Surgical History  Procedure Laterality Date  . Anterior cervical decomp/discectomy fusion    . Shoulder open rotator cuff repair Left   . Knee arthroscopy Right   . Knee cartilage surgery Left   . Melanoma excision      "back"  . Left heart catheterization with coronary angiogram N/A 12/28/2014    Procedure: LEFT HEART CATHETERIZATION WITH CORONARY ANGIOGRAM;  Surgeon: Lorretta Harp, MD;  Location: [facility] CATH LAB;  Service: Cardiovascular;  Laterality: N/A;    . Coronary artery bypass graft N/A 12/29/2014    Procedure: CORONARY ARTERY BYPASS GRAFTING (CABG), ON PUMP, TIMES FOUR, USING LEFT INTERNAL MAMMARY ARTERY, RIGHT GREATER SAPHENOUS VEIN HARVESTED ENDOSCOPICALLY;  Surgeon: Ivin Poot, MD;  Location: [place];  Service: Open Heart Surgery;  Laterality: N/A;  . Tee without cardioversion N/A 12/29/2014    Procedure: TRANSESOPHAGEAL ECHOCARDIOGRAM (TEE);  Surgeon: Ivin Poot, MD;  Location: [place];  Service: Open Heart Surgery;  Laterality: N/A;  . Tee without cardioversion N/A 01/28/2015    Procedure: TRANSESOPHAGEAL ECHOCARDIOGRAM (TEE);  Surgeon: Arnoldo Lenis, MD;  [facility];  Service: Endoscopy;  Laterality: N/A;  . Cardioversion N/A 01/28/2015    Procedure: CARDIOVERSION;  Surgeon: Arnoldo Lenis, MD;  [facility];  Service: Endoscopy;  Laterality: N/A;    Current Outpatient Prescriptions  Medication Sig Dispense Refill  . aspirin 81 MG tablet Take 81 mg by mouth daily.    Marland Kitchen atorvastatin (LIPITOR) 10 MG tablet Take 10 mg by mouth daily.    . clonazePAM (KLONOPIN) 0.5 MG tablet 0.5 mg at bedtime.     Marland Kitchen doxazosin (CARDURA) 4 MG tablet Take 4 mg by mouth 2 (two) times daily.    . Testosterone Cypionate 200 MG/ML KIT Inject 400 mg into the muscle every 14 (fourteen) days.     No current facility-administered medications for this visit.  Allergies:  Lasix   Social History: The patient  reports that he has never smoked. He has never used smokeless tobacco. He reports that he drinks alcohol. He reports that he does not use illicit drugs.   ROS:  Please see the history of present illness. Otherwise, complete review of systems is positive for fatigue, uses testosterone supplements.  All other systems are reviewed and negative.   Physical Exam: VS:  BP 122/70 mmHg  Pulse 95  Ht 6' (1.829 m)  Wt 258 lb (117.028 kg)  BMI 34.98 kg/m2  SpO2 97%, BMI Body mass index is 34.98 kg/(m^2).  Wt Readings from Last 3 Encounters:   05/18/16 258 lb (117.028 kg)  10/08/15 261 lb (118.389 kg)  04/07/15 259 lb (117.482 kg)    General: Obese male, appears comfortable at rest. HEENT: Conjunctiva and lids normal, oropharynx clear with moist mucosa. Neck: Supple, no elevated JVP or carotid bruits, no thyromegaly. Lungs: Decreased breath sounds at the bases, nonlabored breathing at rest. Thorax: Well-healed sternal incision. Scar is prominent, but no direct tenderness, erythema, or drainage. Cardiac: Regular rate and rhythm, no S3 or significant systolic murmur, no pericardial rub. Abdomen: Soft, nontender, bowel sounds present, no guarding or rebound. Extremities: Trace ankle edema, distal pulses 1-2+.  ECG: I personally reviewed the tracing from 04/07/2015 which showed sinus rhythm with left anterior fascicular block and right bundle branch block.  Recent Labwork:    Component Value Date/Time   CHOL 171 12/26/2014 0245   TRIG 120 12/26/2014 0245   HDL 31* 12/26/2014 0245   CHOLHDL 5.5 12/26/2014 0245   VLDL 24 12/26/2014 0245   LDLCALC 116* 12/26/2014 0245  September 2016: Cholesterol 95, triglycerides 51, HDL 40, LDL 45  Other Studies Reviewed Today:  Transesophageal echocardiogram 01/28/2015: Study Conclusions  - Left ventricle: The cavity size was normal. Wall thickness was normal. Systolic function was normal. The estimated ejection fraction was in the range of 55% to 60%. No evidence of thrombus. - Left atrium: The atrium was mildly dilated. No evidence of thrombus in the atrial cavity or appendage. No evidence of thrombus in the atrial cavity or appendage. - Atrial septum: No defect or patent foramen ovale was identified.  Impressions:  - No evidence of intracardiac thrombus. Will proceed with cardioversion.  Assessment and Plan:  1. Symptomatically stable CAD status post CABG in March 2016. No active angina symptoms. Continue medical therapy and observation.  2. Hyperlipidemia, on  low-dose Lipitor. He is concerned about possible side effect including constipation. We will have him stop Lipitor to see if symptoms improve. If so may consider a different statin, although may be good to follow-up with lipid panel per Dr. Willey Blade first to see where his numbers are.  3. Blood pressure is normal today. He is not on any specific antihypertensives.  4. OSA, intolerant of CPAP.  Current medicines were reviewed with the patient today.   Orders Placed This Encounter  Procedures  . EKG 12-Lead    Disposition: Follow-up with me in 6 months.  Signed, Satira Sark, MD, [facility] 05/18/2016 2:30 PM    [place] [facility] [place]. [address], [place], [place] [address] Phone: [phone]; Fax: [phone]

## 2016-06-09 ENCOUNTER — Telehealth: Payer: Self-pay | Admitting: Cardiology

## 2016-06-09 NOTE — Telephone Encounter (Signed)
FYI - patient states that he is doing better after being taken off of Cholestrol meds / tg

## 2016-06-09 NOTE — Telephone Encounter (Signed)
Will forward to Dr. McDowell 

## 2016-06-10 NOTE — Telephone Encounter (Signed)
Stay off Lipitor for now. Will review his next lipid panel per Dr. Willey Blade and then we can decide about trying a different statin possibly.

## 2016-06-12 NOTE — Telephone Encounter (Signed)
Left message for pt to return my call.

## 2016-06-13 NOTE — Telephone Encounter (Signed)
Pt made aware

## 2016-07-06 DIAGNOSIS — E291 Testicular hypofunction: Secondary | ICD-10-CM | POA: Diagnosis not present

## 2016-07-07 ENCOUNTER — Ambulatory Visit (INDEPENDENT_AMBULATORY_CARE_PROVIDER_SITE_OTHER): Payer: Medicare Other | Admitting: Urology

## 2016-07-07 DIAGNOSIS — N401 Enlarged prostate with lower urinary tract symptoms: Secondary | ICD-10-CM

## 2016-07-07 DIAGNOSIS — R3912 Poor urinary stream: Secondary | ICD-10-CM

## 2016-07-07 DIAGNOSIS — E291 Testicular hypofunction: Secondary | ICD-10-CM | POA: Diagnosis not present

## 2016-07-10 DIAGNOSIS — E538 Deficiency of other specified B group vitamins: Secondary | ICD-10-CM | POA: Diagnosis not present

## 2016-07-10 DIAGNOSIS — E785 Hyperlipidemia, unspecified: Secondary | ICD-10-CM | POA: Diagnosis not present

## 2016-07-10 DIAGNOSIS — D518 Other vitamin B12 deficiency anemias: Secondary | ICD-10-CM | POA: Diagnosis not present

## 2016-07-10 DIAGNOSIS — E559 Vitamin D deficiency, unspecified: Secondary | ICD-10-CM | POA: Diagnosis not present

## 2016-07-10 DIAGNOSIS — M1990 Unspecified osteoarthritis, unspecified site: Secondary | ICD-10-CM | POA: Diagnosis not present

## 2016-07-10 DIAGNOSIS — E291 Testicular hypofunction: Secondary | ICD-10-CM | POA: Diagnosis not present

## 2016-07-10 DIAGNOSIS — Z79899 Other long term (current) drug therapy: Secondary | ICD-10-CM | POA: Diagnosis not present

## 2016-07-10 DIAGNOSIS — R609 Edema, unspecified: Secondary | ICD-10-CM | POA: Diagnosis not present

## 2016-07-10 DIAGNOSIS — I10 Essential (primary) hypertension: Secondary | ICD-10-CM | POA: Diagnosis not present

## 2016-07-17 DIAGNOSIS — I10 Essential (primary) hypertension: Secondary | ICD-10-CM | POA: Diagnosis not present

## 2016-07-17 DIAGNOSIS — D510 Vitamin B12 deficiency anemia due to intrinsic factor deficiency: Secondary | ICD-10-CM | POA: Diagnosis not present

## 2016-07-17 DIAGNOSIS — Z23 Encounter for immunization: Secondary | ICD-10-CM | POA: Diagnosis not present

## 2016-07-17 DIAGNOSIS — E785 Hyperlipidemia, unspecified: Secondary | ICD-10-CM | POA: Diagnosis not present

## 2016-07-24 DIAGNOSIS — E538 Deficiency of other specified B group vitamins: Secondary | ICD-10-CM | POA: Diagnosis not present

## 2016-07-31 DIAGNOSIS — E538 Deficiency of other specified B group vitamins: Secondary | ICD-10-CM | POA: Diagnosis not present

## 2016-08-07 DIAGNOSIS — E538 Deficiency of other specified B group vitamins: Secondary | ICD-10-CM | POA: Diagnosis not present

## 2016-08-14 DIAGNOSIS — N529 Male erectile dysfunction, unspecified: Secondary | ICD-10-CM | POA: Diagnosis not present

## 2016-08-14 DIAGNOSIS — E538 Deficiency of other specified B group vitamins: Secondary | ICD-10-CM | POA: Diagnosis not present

## 2016-09-04 DIAGNOSIS — N529 Male erectile dysfunction, unspecified: Secondary | ICD-10-CM | POA: Diagnosis not present

## 2016-09-04 DIAGNOSIS — E538 Deficiency of other specified B group vitamins: Secondary | ICD-10-CM | POA: Diagnosis not present

## 2016-09-11 DIAGNOSIS — E538 Deficiency of other specified B group vitamins: Secondary | ICD-10-CM | POA: Diagnosis not present

## 2016-09-18 DIAGNOSIS — N529 Male erectile dysfunction, unspecified: Secondary | ICD-10-CM | POA: Diagnosis not present

## 2016-10-02 DIAGNOSIS — N529 Male erectile dysfunction, unspecified: Secondary | ICD-10-CM | POA: Diagnosis not present

## 2016-10-11 DIAGNOSIS — Z79899 Other long term (current) drug therapy: Secondary | ICD-10-CM | POA: Diagnosis not present

## 2016-10-11 DIAGNOSIS — E785 Hyperlipidemia, unspecified: Secondary | ICD-10-CM | POA: Diagnosis not present

## 2016-10-12 DIAGNOSIS — E538 Deficiency of other specified B group vitamins: Secondary | ICD-10-CM | POA: Diagnosis not present

## 2016-10-19 DIAGNOSIS — G4700 Insomnia, unspecified: Secondary | ICD-10-CM | POA: Diagnosis not present

## 2016-10-19 DIAGNOSIS — E785 Hyperlipidemia, unspecified: Secondary | ICD-10-CM | POA: Diagnosis not present

## 2016-10-19 DIAGNOSIS — D510 Vitamin B12 deficiency anemia due to intrinsic factor deficiency: Secondary | ICD-10-CM | POA: Diagnosis not present

## 2016-11-13 DIAGNOSIS — E538 Deficiency of other specified B group vitamins: Secondary | ICD-10-CM | POA: Diagnosis not present

## 2016-11-27 ENCOUNTER — Ambulatory Visit (INDEPENDENT_AMBULATORY_CARE_PROVIDER_SITE_OTHER): Payer: Medicare Other | Admitting: Cardiology

## 2016-11-27 ENCOUNTER — Encounter: Payer: Self-pay | Admitting: Cardiology

## 2016-11-27 VITALS — BP 112/72 | HR 82 | Ht 72.0 in | Wt 267.0 lb

## 2016-11-27 DIAGNOSIS — I2510 Atherosclerotic heart disease of native coronary artery without angina pectoris: Secondary | ICD-10-CM | POA: Diagnosis not present

## 2016-11-27 DIAGNOSIS — I10 Essential (primary) hypertension: Secondary | ICD-10-CM | POA: Diagnosis not present

## 2016-11-27 DIAGNOSIS — K219 Gastro-esophageal reflux disease without esophagitis: Secondary | ICD-10-CM

## 2016-11-27 DIAGNOSIS — E782 Mixed hyperlipidemia: Secondary | ICD-10-CM

## 2016-11-27 NOTE — Patient Instructions (Signed)
Your physician wants you to follow-up in: 1 year with Dr McDowell You will receive a reminder letter in the mail two months in advance. If you don't receive a letter, please call our office to schedule the follow-up appointment.    Your physician recommends that you continue on your current medications as directed. Please refer to the Current Medication list given to you today.     If you need a refill on your cardiac medications before your next appointment, please call your pharmacy.     Thank you for choosing [facility] !        

## 2016-11-27 NOTE — Progress Notes (Signed)
Cardiology Office Note  Date: 11/27/2016   ID: Devin [name], DOB Jan 20, 1946, MRN [id number]  PCP: [name], MD  Primary Cardiologist: Rozann Lesches, MD   Chief Complaint  Patient presents with  . Coronary Artery Disease    History of Present Illness: Devin [name] is a 71 y.o. male last seen in July 2017. He presents for a routine follow-up visit. Since last encounter no major change in symptoms or stamina. 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 Reports chronic postsurgical thoracic discomfort, nonlimiting. Also neuropathic scar discomfort.  We had him stop Lipitor after the last visit due to GI side effects. He states that it did not change much of the constipation that he was experiencing so he went back on it. He follows with Dr. Willey Blade, his most recent lab work from September last year as outlined below.  Blood pressure is normal today. At the present time he is not on an antihypertensive specifically.  No progressive reflux symptoms. He states that he takes "pickle juice" in the evenings.  Past Medical History:  Diagnosis Date  . Arthritis   . Asbestosis ([place])   . 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  . Essential hypertension   . GERD (gastroesophageal reflux disease)   . Melanoma of back ([place])   . Prostatic hypertrophy   . Sleep apnea    Intolerant of CPAP    Past Surgical History:  Procedure Laterality Date  . ANTERIOR CERVICAL DECOMP/DISCECTOMY FUSION    . CARDIOVERSION N/A 01/28/2015   Procedure: CARDIOVERSION;  Surgeon: Arnoldo Lenis, MD;  [facility];  Service: Endoscopy;  Laterality: N/A;  . CORONARY ARTERY BYPASS GRAFT N/A 12/29/2014   Procedure: CORONARY ARTERY BYPASS GRAFTING (CABG), ON PUMP, TIMES FOUR, USING LEFT INTERNAL MAMMARY ARTERY, RIGHT GREATER SAPHENOUS VEIN HARVESTED ENDOSCOPICALLY;  Surgeon: Ivin Poot, MD;  Location: [place];   Service: Open Heart Surgery;  Laterality: N/A;  . KNEE ARTHROSCOPY Right   . KNEE CARTILAGE SURGERY Left   . LEFT HEART CATHETERIZATION WITH CORONARY ANGIOGRAM N/A 12/28/2014   Procedure: LEFT HEART CATHETERIZATION WITH CORONARY ANGIOGRAM;  Surgeon: Lorretta Harp, MD;  Location: [facility] CATH LAB;  Service: Cardiovascular;  Laterality: N/A;  . MELANOMA EXCISION     "back"  . SHOULDER OPEN ROTATOR CUFF REPAIR Left   . TEE WITHOUT CARDIOVERSION N/A 12/29/2014   Procedure: TRANSESOPHAGEAL ECHOCARDIOGRAM (TEE);  Surgeon: Ivin Poot, MD;  Location: [place];  Service: Open Heart Surgery;  Laterality: N/A;  . TEE WITHOUT CARDIOVERSION N/A 01/28/2015   Procedure: TRANSESOPHAGEAL ECHOCARDIOGRAM (TEE);  Surgeon: Arnoldo Lenis, MD;  [facility];  Service: Endoscopy;  Laterality: N/A;    Current Outpatient Prescriptions  Medication Sig Dispense Refill  . aspirin 81 MG tablet Take 81 mg by mouth daily.    Marland Kitchen atorvastatin (LIPITOR) 10 MG tablet Take 10 mg by mouth daily.    . clonazePAM (KLONOPIN) 0.5 MG tablet 0.5 mg at bedtime.     Marland Kitchen doxazosin (CARDURA) 4 MG tablet Take 4 mg by mouth 2 (two) times daily.    . Testosterone Cypionate 200 MG/ML KIT Inject 400 mg into the muscle every 14 (fourteen) days.     No current facility-administered medications for this visit.    Allergies:  Lasix [furosemide]   Social History: The patient  reports that he  has never smoked. He has never used smokeless tobacco. He reports that he drinks alcohol. He reports that he does not use drugs.   ROS:  Please see the history of present illness. Otherwise, complete review of systems is positive for intermittent leg swelling. No orthopnea or PND.  All other systems are reviewed and negative.   Physical Exam: VS:  BP 112/72   Pulse 82   Ht 6' (1.829 m)   Wt 267 lb (121.1 kg)   SpO2 95%   BMI 36.21 kg/m , BMI Body mass index is 36.21 kg/m.  Wt Readings from Last 3 Encounters:  11/27/16 267 lb (121.1 kg)    05/18/16 258 lb (117 kg)  10/08/15 261 lb (118.4 kg)    General: Obese male, appears comfortable at rest. HEENT: Conjunctiva and lids normal, oropharynx clear. Neck: Supple, no elevated JVP or carotid bruits, no thyromegaly. Lungs: Decreased breath sounds at the bases, nonlabored breathing at rest. Thorax: Well-healed sternal incision. Keloid scar formation in the lower third of his incision. Cardiac: Regular rate and rhythm, no S3 or significant systolic murmur, no pericardial rub. Abdomen: Soft, nontender, bowel sounds present, no guarding or rebound. Extremities: Trace ankle edema, distal pulses 1-2+. Skin: Warm and dry. Musculoskeletal: No kyphosis. Neuropsychiatric: Alert and oriented 3, affect appropriate.  ECG: I personally reviewed the tracing from 05/18/2016 which showed sinus rhythm with right bundle branch block and left anterior fascicular block.  Recent Labwork:    Component Value Date/Time   CHOL 171 12/26/2014 0245   TRIG 120 12/26/2014 0245   HDL 31 (L) 12/26/2014 0245   CHOLHDL 5.5 12/26/2014 0245   VLDL 24 12/26/2014 0245   LDLCALC 116 (H) 12/26/2014 0245  September 2017: BUN 16, creatinine 1.0, potassium 4.8, AST 13, ALT 12, hemoglobin 15.4, platelets 157, cholesterol 196, triglycerides 102, HDL 37, LDL 139  Other Studies Reviewed Today:  TEE 01/28/2015: Study Conclusions  - Left ventricle: The cavity size was normal. Wall thickness was normal. Systolic function was normal. The estimated ejection fraction was in the range of 55% to 60%. No evidence of thrombus. - Left atrium: The atrium was mildly dilated. No evidence of thrombus in the atrial cavity or appendage. No evidence of thrombus in the atrial cavity or appendage. - Atrial septum: No defect or patent foramen ovale was identified.  Impressions:  - No evidence of intracardiac thrombus. Will proceed with cardioversion.  Assessment and Plan:  1. Multivessel CAD status post CABG in  March 2016. No angina symptoms. Current regimen includes aspirin and statin.  2. Hyperlipidemia, back on Lipitor. States that he plans to continue. Lab work with Dr. Willey Blade reviewed. Last LDL was 139, he may need to have dose increased is able to tolerate.  3. History of hypertension, blood pressure is normal today.  4. Reportedly stable reflux symptoms. He continues on aspirin. No melena or hematochezia.  Current medicines were reviewed with the patient today.  Disposition: Follow-up in one year, sooner if needed.  Signed, Satira Sark, MD, [facility] 11/27/2016 8:41 AM    [place] at [place]. [address], [place], [place] [address] Phone: [phone]; Fax: [phone]

## 2016-12-18 DIAGNOSIS — E538 Deficiency of other specified B group vitamins: Secondary | ICD-10-CM | POA: Diagnosis not present

## 2017-01-08 DIAGNOSIS — E291 Testicular hypofunction: Secondary | ICD-10-CM | POA: Diagnosis not present

## 2017-01-12 ENCOUNTER — Ambulatory Visit (INDEPENDENT_AMBULATORY_CARE_PROVIDER_SITE_OTHER): Payer: Medicare Other | Admitting: Urology

## 2017-01-12 DIAGNOSIS — R3912 Poor urinary stream: Secondary | ICD-10-CM | POA: Diagnosis not present

## 2017-01-12 DIAGNOSIS — E291 Testicular hypofunction: Secondary | ICD-10-CM

## 2017-01-12 DIAGNOSIS — N5201 Erectile dysfunction due to arterial insufficiency: Secondary | ICD-10-CM

## 2017-01-12 DIAGNOSIS — N401 Enlarged prostate with lower urinary tract symptoms: Secondary | ICD-10-CM

## 2017-01-22 DIAGNOSIS — N529 Male erectile dysfunction, unspecified: Secondary | ICD-10-CM | POA: Diagnosis not present

## 2017-01-22 DIAGNOSIS — E538 Deficiency of other specified B group vitamins: Secondary | ICD-10-CM | POA: Diagnosis not present

## 2017-02-05 DIAGNOSIS — N529 Male erectile dysfunction, unspecified: Secondary | ICD-10-CM | POA: Diagnosis not present

## 2017-02-19 DIAGNOSIS — E538 Deficiency of other specified B group vitamins: Secondary | ICD-10-CM | POA: Diagnosis not present

## 2017-02-19 DIAGNOSIS — N529 Male erectile dysfunction, unspecified: Secondary | ICD-10-CM | POA: Diagnosis not present

## 2017-02-22 DIAGNOSIS — R1013 Epigastric pain: Secondary | ICD-10-CM | POA: Diagnosis not present

## 2017-02-22 DIAGNOSIS — I4510 Unspecified right bundle-branch block: Secondary | ICD-10-CM | POA: Diagnosis not present

## 2017-02-22 DIAGNOSIS — Z6837 Body mass index (BMI) 37.0-37.9, adult: Secondary | ICD-10-CM | POA: Diagnosis not present

## 2017-03-05 DIAGNOSIS — N529 Male erectile dysfunction, unspecified: Secondary | ICD-10-CM | POA: Diagnosis not present

## 2017-03-12 DIAGNOSIS — D518 Other vitamin B12 deficiency anemias: Secondary | ICD-10-CM | POA: Diagnosis not present

## 2017-03-19 DIAGNOSIS — N529 Male erectile dysfunction, unspecified: Secondary | ICD-10-CM | POA: Diagnosis not present

## 2017-03-19 DIAGNOSIS — D510 Vitamin B12 deficiency anemia due to intrinsic factor deficiency: Secondary | ICD-10-CM | POA: Diagnosis not present

## 2017-03-19 DIAGNOSIS — K8000 Calculus of gallbladder with acute cholecystitis without obstruction: Secondary | ICD-10-CM | POA: Diagnosis not present

## 2017-03-19 DIAGNOSIS — E538 Deficiency of other specified B group vitamins: Secondary | ICD-10-CM | POA: Diagnosis not present

## 2017-04-02 DIAGNOSIS — N529 Male erectile dysfunction, unspecified: Secondary | ICD-10-CM | POA: Diagnosis not present

## 2017-05-22 DIAGNOSIS — N529 Male erectile dysfunction, unspecified: Secondary | ICD-10-CM | POA: Diagnosis not present

## 2017-05-22 DIAGNOSIS — E538 Deficiency of other specified B group vitamins: Secondary | ICD-10-CM | POA: Diagnosis not present

## 2017-06-05 DIAGNOSIS — N529 Male erectile dysfunction, unspecified: Secondary | ICD-10-CM | POA: Diagnosis not present

## 2017-06-19 DIAGNOSIS — N529 Male erectile dysfunction, unspecified: Secondary | ICD-10-CM | POA: Diagnosis not present

## 2017-06-19 DIAGNOSIS — E538 Deficiency of other specified B group vitamins: Secondary | ICD-10-CM | POA: Diagnosis not present

## 2017-07-03 DIAGNOSIS — E291 Testicular hypofunction: Secondary | ICD-10-CM | POA: Diagnosis not present

## 2017-07-09 DIAGNOSIS — Z79899 Other long term (current) drug therapy: Secondary | ICD-10-CM | POA: Diagnosis not present

## 2017-07-09 DIAGNOSIS — I10 Essential (primary) hypertension: Secondary | ICD-10-CM | POA: Diagnosis not present

## 2017-07-09 DIAGNOSIS — M1990 Unspecified osteoarthritis, unspecified site: Secondary | ICD-10-CM | POA: Diagnosis not present

## 2017-07-09 DIAGNOSIS — D508 Other iron deficiency anemias: Secondary | ICD-10-CM | POA: Diagnosis not present

## 2017-07-09 DIAGNOSIS — D510 Vitamin B12 deficiency anemia due to intrinsic factor deficiency: Secondary | ICD-10-CM | POA: Diagnosis not present

## 2017-07-09 DIAGNOSIS — E785 Hyperlipidemia, unspecified: Secondary | ICD-10-CM | POA: Diagnosis not present

## 2017-07-16 DIAGNOSIS — E785 Hyperlipidemia, unspecified: Secondary | ICD-10-CM | POA: Diagnosis not present

## 2017-07-16 DIAGNOSIS — I10 Essential (primary) hypertension: Secondary | ICD-10-CM | POA: Diagnosis not present

## 2017-07-16 DIAGNOSIS — E538 Deficiency of other specified B group vitamins: Secondary | ICD-10-CM | POA: Diagnosis not present

## 2017-07-16 DIAGNOSIS — I2510 Atherosclerotic heart disease of native coronary artery without angina pectoris: Secondary | ICD-10-CM | POA: Diagnosis not present

## 2017-07-23 DIAGNOSIS — E291 Testicular hypofunction: Secondary | ICD-10-CM | POA: Diagnosis not present

## 2017-07-23 DIAGNOSIS — N401 Enlarged prostate with lower urinary tract symptoms: Secondary | ICD-10-CM | POA: Diagnosis not present

## 2017-07-27 ENCOUNTER — Ambulatory Visit (INDEPENDENT_AMBULATORY_CARE_PROVIDER_SITE_OTHER): Payer: Medicare Other | Admitting: Urology

## 2017-07-27 DIAGNOSIS — E291 Testicular hypofunction: Secondary | ICD-10-CM | POA: Diagnosis not present

## 2017-07-27 DIAGNOSIS — N5201 Erectile dysfunction due to arterial insufficiency: Secondary | ICD-10-CM

## 2017-07-27 DIAGNOSIS — N401 Enlarged prostate with lower urinary tract symptoms: Secondary | ICD-10-CM | POA: Diagnosis not present

## 2017-07-27 DIAGNOSIS — R351 Nocturia: Secondary | ICD-10-CM

## 2017-07-30 DIAGNOSIS — E538 Deficiency of other specified B group vitamins: Secondary | ICD-10-CM | POA: Diagnosis not present

## 2017-07-30 DIAGNOSIS — N529 Male erectile dysfunction, unspecified: Secondary | ICD-10-CM | POA: Diagnosis not present

## 2017-08-13 DIAGNOSIS — N529 Male erectile dysfunction, unspecified: Secondary | ICD-10-CM | POA: Diagnosis not present

## 2017-08-27 DIAGNOSIS — E538 Deficiency of other specified B group vitamins: Secondary | ICD-10-CM | POA: Diagnosis not present

## 2017-09-06 DIAGNOSIS — E538 Deficiency of other specified B group vitamins: Secondary | ICD-10-CM | POA: Diagnosis not present

## 2017-09-24 DIAGNOSIS — N529 Male erectile dysfunction, unspecified: Secondary | ICD-10-CM | POA: Diagnosis not present

## 2017-10-15 DIAGNOSIS — N529 Male erectile dysfunction, unspecified: Secondary | ICD-10-CM | POA: Diagnosis not present

## 2017-10-29 DIAGNOSIS — N529 Male erectile dysfunction, unspecified: Secondary | ICD-10-CM | POA: Diagnosis not present

## 2017-10-29 DIAGNOSIS — E538 Deficiency of other specified B group vitamins: Secondary | ICD-10-CM | POA: Diagnosis not present

## 2017-11-19 DIAGNOSIS — N529 Male erectile dysfunction, unspecified: Secondary | ICD-10-CM | POA: Diagnosis not present

## 2017-11-19 DIAGNOSIS — E538 Deficiency of other specified B group vitamins: Secondary | ICD-10-CM | POA: Diagnosis not present

## 2017-11-29 NOTE — Progress Notes (Signed)
Cardiology Office Note  Date: 12/03/2017   ID: Devin [name], DOB [birth year], MRN [id number]  PCP: [name], MD  Primary Cardiologist: Rozann Lesches, MD   Chief Complaint  Patient presents with  . Coronary Artery Disease    History of Present Illness: Devin [name] is a 72 y.o. male last seen in January 2018.  He presents for a routine follow-up visit.  He does not indicate any significant change in stamina over the last year.  Enjoys walking outdoors when the weather allows, does work on his property including feeding cows regularly.  He continues on stable medical regimen which from a cardiac perspective includes aspirin and Lipitor.  I personally reviewed his ECG today which shows a sinus rhythm with right bundle branch block and left anterior fascicular block.  No significant changes.  Lab work from September 2018 as outlined below.  LDL was 71.  He is almost 3 years out from CABG as outlined below.  Past Medical History:  Diagnosis Date  . Arthritis   . Asbestosis ([place])   . 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  . Essential hypertension   . GERD (gastroesophageal reflux disease)   . Melanoma of back ([place])   . Prostatic hypertrophy   . Sleep apnea    Intolerant of CPAP    Past Surgical History:  Procedure Laterality Date  . ANTERIOR CERVICAL DECOMP/DISCECTOMY FUSION    . CARDIOVERSION N/A 01/28/2015   Procedure: CARDIOVERSION;  Surgeon: Arnoldo Lenis, MD;  [facility];  Service: Endoscopy;  Laterality: N/A;  . CORONARY ARTERY BYPASS GRAFT N/A 12/29/2014   Procedure: CORONARY ARTERY BYPASS GRAFTING (CABG), ON PUMP, TIMES FOUR, USING LEFT INTERNAL MAMMARY ARTERY, RIGHT GREATER SAPHENOUS VEIN HARVESTED ENDOSCOPICALLY;  Surgeon: Ivin Poot, MD;  Location: [place];  Service: Open Heart Surgery;  Laterality: N/A;  . KNEE ARTHROSCOPY Right   . KNEE CARTILAGE SURGERY Left    . LEFT HEART CATHETERIZATION WITH CORONARY ANGIOGRAM N/A 12/28/2014   Procedure: LEFT HEART CATHETERIZATION WITH CORONARY ANGIOGRAM;  Surgeon: Lorretta Harp, MD;  Location[facility] CATH LAB;  Service: Cardiovascular;  Laterality: N/A;  . MELANOMA EXCISION     "back"  . SHOULDER OPEN ROTATOR CUFF REPAIR Left   . TEE WITHOUT CARDIOVERSION N/A 12/29/2014   Procedure: TRANSESOPHAGEAL ECHOCARDIOGRAM (TEE);  Surgeon: Ivin Poot, MD;  Location: [place];  Service: Open Heart Surgery;  Laterality: N/A;  . TEE WITHOUT CARDIOVERSION N/A 01/28/2015   Procedure: TRANSESOPHAGEAL ECHOCARDIOGRAM (TEE);  Surgeon: Arnoldo Lenis, MD;  [facility];  Service: Endoscopy;  Laterality: N/A;    Current Outpatient Medications  Medication Sig Dispense Refill  . aspirin 81 MG tablet Take 81 mg by mouth daily.    Marland Kitchen atorvastatin (LIPITOR) 10 MG tablet Take 10 mg by mouth daily.    . clonazePAM (KLONOPIN) 0.5 MG tablet 0.5 mg at bedtime.     Marland Kitchen doxazosin (CARDURA) 4 MG tablet Take 4 mg by mouth 2 (two) times daily.    . Testosterone Cypionate 200 MG/ML KIT Inject 400 mg into the muscle every 14 (fourteen) days.     No current facility-administered medications for this visit.    Allergies:  Lasix [furosemide]   Social History: The patient  reports that  has never smoked. he has never used smokeless tobacco. He reports that he drinks alcohol. He  reports that he does not use drugs.   ROS:  Please see the history of present illness. Otherwise, complete review of systems is positive for intermittent cough and chest congestion.  All other systems are reviewed and negative.   Physical Exam: VS:  BP 120/77   Pulse 85   Wt 277 lb (125.6 kg)   SpO2 95%   BMI 37.57 kg/m , BMI Body mass index is 37.57 kg/m.  Wt Readings from Last 3 Encounters:  12/03/17 277 lb (125.6 kg)  11/27/16 267 lb (121.1 kg)  05/18/16 258 lb (117 kg)    General: Obese male, appears comfortable at rest. HEENT: Conjunctiva and lids  normal, oropharynx clear. Neck: Supple, no elevated JVP or carotid bruits, no thyromegaly. Lungs: Clear to auscultation, nonlabored breathing at rest. Thorax: Well-healed sternal incision. Cardiac: Regular rate and rhythm, no S3 or significant systolic murmur, no pericardial rub. Abdomen: Soft, nontender, bowel sounds present. Extremities: Trace ankle edema, distal pulses 2+. Skin: Warm and dry. Musculoskeletal: No kyphosis. Neuropsychiatric: Alert and oriented x3, affect grossly appropriate.  ECG: I personally reviewed the tracing from 05/18/2016 which showed sinus rhythm with right bundle branch block and left anterior fascicular block.  Recent Labwork:  September 2018: BUN 13, creatinine 1.0, potassium 4.8, AST 15, ALT 16, hemoglobin 15.9, platelets 167, cholesterol 121, triglycerides 71, HDL 36, LDL 71  Other Studies Reviewed Today:  TEE 01/28/2015: Study Conclusions  - Left ventricle: The cavity size was normal. Wall thickness was normal. Systolic function was normal. The estimated ejection fraction was in the range of 55% to 60%. No evidence of thrombus. - Left atrium: The atrium was mildly dilated. No evidence of thrombus in the atrial cavity or appendage. No evidence of thrombus in the atrial cavity or appendage. - Atrial septum: No defect or patent foramen ovale was identified.  Impressions:  - No evidence of intracardiac thrombus. Will proceed with cardioversion.  Assessment and Plan:  1.  Symptomatically stable multivessel CAD status post CABG in March 2016.  ECG reviewed, no significant change.  Continue with medical therapy and observation.  2.  Mixed hyperlipidemia, continues on statin therapy.  Recent LDL 71.  3.  Essential hypertension by history.  Currently not on antihypertensive medication.  Blood pressure is normal today.  4.  OSA, intolerant of CPAP.  Current medicines were reviewed with the patient today.   Orders Placed This Encounter    Procedures  . EKG 12-Lead    Disposition: Follow-up in 1 year, sooner if needed.  Signed, Satira Sark, MD, [facility] 12/03/2017 9:59 AM    [facility] at [place]. [address], [place], [place] [address] Phone: [phone]; Fax: [phone]

## 2017-12-03 ENCOUNTER — Encounter: Payer: Self-pay | Admitting: Cardiology

## 2017-12-03 ENCOUNTER — Ambulatory Visit (INDEPENDENT_AMBULATORY_CARE_PROVIDER_SITE_OTHER): Payer: Medicare Other | Admitting: Cardiology

## 2017-12-03 VITALS — BP 120/77 | HR 85 | Wt 277.0 lb

## 2017-12-03 DIAGNOSIS — N529 Male erectile dysfunction, unspecified: Secondary | ICD-10-CM | POA: Diagnosis not present

## 2017-12-03 DIAGNOSIS — I2510 Atherosclerotic heart disease of native coronary artery without angina pectoris: Secondary | ICD-10-CM | POA: Diagnosis not present

## 2017-12-03 DIAGNOSIS — G4733 Obstructive sleep apnea (adult) (pediatric): Secondary | ICD-10-CM | POA: Diagnosis not present

## 2017-12-03 DIAGNOSIS — E782 Mixed hyperlipidemia: Secondary | ICD-10-CM | POA: Diagnosis not present

## 2017-12-03 DIAGNOSIS — I10 Essential (primary) hypertension: Secondary | ICD-10-CM | POA: Diagnosis not present

## 2017-12-03 NOTE — Patient Instructions (Addendum)
Your physician wants you to follow-up in: 1 year with Dr.McDowell You will receive a reminder letter in the mail two months in advance. If you don't receive a letter, please call our office to schedule the follow-up appointment.    Your physician recommends that you continue on your current medications as directed. Please refer to the Current Medication list given to you today.    If you need a refill on your cardiac medications before your next appointment, please call your pharmacy.     No lab work or tests ordered today.      Thank you for choosing [facility] !        

## 2017-12-17 DIAGNOSIS — E539 Vitamin B deficiency, unspecified: Secondary | ICD-10-CM | POA: Diagnosis not present

## 2017-12-17 DIAGNOSIS — N529 Male erectile dysfunction, unspecified: Secondary | ICD-10-CM | POA: Diagnosis not present

## 2017-12-31 DIAGNOSIS — N529 Male erectile dysfunction, unspecified: Secondary | ICD-10-CM | POA: Diagnosis not present

## 2017-12-31 DIAGNOSIS — E539 Vitamin B deficiency, unspecified: Secondary | ICD-10-CM | POA: Diagnosis not present

## 2018-01-14 DIAGNOSIS — N529 Male erectile dysfunction, unspecified: Secondary | ICD-10-CM | POA: Diagnosis not present

## 2018-01-24 DIAGNOSIS — E291 Testicular hypofunction: Secondary | ICD-10-CM | POA: Diagnosis not present

## 2018-01-25 ENCOUNTER — Ambulatory Visit (INDEPENDENT_AMBULATORY_CARE_PROVIDER_SITE_OTHER): Payer: Medicare Other | Admitting: Urology

## 2018-01-25 DIAGNOSIS — N401 Enlarged prostate with lower urinary tract symptoms: Secondary | ICD-10-CM

## 2018-01-25 DIAGNOSIS — N5201 Erectile dysfunction due to arterial insufficiency: Secondary | ICD-10-CM | POA: Diagnosis not present

## 2018-01-25 DIAGNOSIS — R351 Nocturia: Secondary | ICD-10-CM

## 2018-01-25 DIAGNOSIS — E291 Testicular hypofunction: Secondary | ICD-10-CM | POA: Diagnosis not present

## 2018-01-29 DIAGNOSIS — H5203 Hypermetropia, bilateral: Secondary | ICD-10-CM | POA: Diagnosis not present

## 2018-01-29 DIAGNOSIS — H524 Presbyopia: Secondary | ICD-10-CM | POA: Diagnosis not present

## 2018-01-29 DIAGNOSIS — H25093 Other age-related incipient cataract, bilateral: Secondary | ICD-10-CM | POA: Diagnosis not present

## 2018-01-31 DIAGNOSIS — Z77090 Contact with and (suspected) exposure to asbestos: Secondary | ICD-10-CM | POA: Diagnosis not present

## 2018-01-31 DIAGNOSIS — Z6839 Body mass index (BMI) 39.0-39.9, adult: Secondary | ICD-10-CM | POA: Diagnosis not present

## 2018-01-31 DIAGNOSIS — R05 Cough: Secondary | ICD-10-CM | POA: Diagnosis not present

## 2018-02-11 DIAGNOSIS — N529 Male erectile dysfunction, unspecified: Secondary | ICD-10-CM | POA: Diagnosis not present

## 2018-03-04 DIAGNOSIS — N529 Male erectile dysfunction, unspecified: Secondary | ICD-10-CM | POA: Diagnosis not present

## 2018-03-04 DIAGNOSIS — E539 Vitamin B deficiency, unspecified: Secondary | ICD-10-CM | POA: Diagnosis not present

## 2018-03-18 DIAGNOSIS — N529 Male erectile dysfunction, unspecified: Secondary | ICD-10-CM | POA: Diagnosis not present

## 2018-04-01 DIAGNOSIS — N529 Male erectile dysfunction, unspecified: Secondary | ICD-10-CM | POA: Diagnosis not present

## 2018-04-01 DIAGNOSIS — E539 Vitamin B deficiency, unspecified: Secondary | ICD-10-CM | POA: Diagnosis not present

## 2018-04-15 DIAGNOSIS — N529 Male erectile dysfunction, unspecified: Secondary | ICD-10-CM | POA: Diagnosis not present

## 2018-04-29 DIAGNOSIS — R05 Cough: Secondary | ICD-10-CM | POA: Diagnosis not present

## 2018-04-29 DIAGNOSIS — E538 Deficiency of other specified B group vitamins: Secondary | ICD-10-CM | POA: Diagnosis not present

## 2018-04-29 DIAGNOSIS — N529 Male erectile dysfunction, unspecified: Secondary | ICD-10-CM | POA: Diagnosis not present

## 2018-05-13 DIAGNOSIS — N529 Male erectile dysfunction, unspecified: Secondary | ICD-10-CM | POA: Diagnosis not present

## 2018-05-14 DIAGNOSIS — M1711 Unilateral primary osteoarthritis, right knee: Secondary | ICD-10-CM | POA: Diagnosis not present

## 2018-05-14 DIAGNOSIS — M1712 Unilateral primary osteoarthritis, left knee: Secondary | ICD-10-CM | POA: Diagnosis not present

## 2018-05-28 DIAGNOSIS — N529 Male erectile dysfunction, unspecified: Secondary | ICD-10-CM | POA: Diagnosis not present

## 2018-06-17 DIAGNOSIS — M19012 Primary osteoarthritis, left shoulder: Secondary | ICD-10-CM | POA: Diagnosis not present

## 2018-06-17 DIAGNOSIS — M1712 Unilateral primary osteoarthritis, left knee: Secondary | ICD-10-CM | POA: Diagnosis not present

## 2018-06-17 DIAGNOSIS — M19011 Primary osteoarthritis, right shoulder: Secondary | ICD-10-CM | POA: Diagnosis not present

## 2018-06-17 DIAGNOSIS — M1711 Unilateral primary osteoarthritis, right knee: Secondary | ICD-10-CM | POA: Diagnosis not present

## 2018-06-24 DIAGNOSIS — E538 Deficiency of other specified B group vitamins: Secondary | ICD-10-CM | POA: Diagnosis not present

## 2018-06-24 DIAGNOSIS — N529 Male erectile dysfunction, unspecified: Secondary | ICD-10-CM | POA: Diagnosis not present

## 2018-07-08 DIAGNOSIS — E538 Deficiency of other specified B group vitamins: Secondary | ICD-10-CM | POA: Diagnosis not present

## 2018-07-22 DIAGNOSIS — N401 Enlarged prostate with lower urinary tract symptoms: Secondary | ICD-10-CM | POA: Diagnosis not present

## 2018-07-22 DIAGNOSIS — E291 Testicular hypofunction: Secondary | ICD-10-CM | POA: Diagnosis not present

## 2018-07-23 DIAGNOSIS — N529 Male erectile dysfunction, unspecified: Secondary | ICD-10-CM | POA: Diagnosis not present

## 2018-07-23 DIAGNOSIS — E538 Deficiency of other specified B group vitamins: Secondary | ICD-10-CM | POA: Diagnosis not present

## 2018-07-26 ENCOUNTER — Ambulatory Visit (INDEPENDENT_AMBULATORY_CARE_PROVIDER_SITE_OTHER): Payer: Medicare Other | Admitting: Urology

## 2018-07-26 DIAGNOSIS — E291 Testicular hypofunction: Secondary | ICD-10-CM | POA: Diagnosis not present

## 2018-07-26 DIAGNOSIS — N401 Enlarged prostate with lower urinary tract symptoms: Secondary | ICD-10-CM

## 2018-07-26 DIAGNOSIS — R351 Nocturia: Secondary | ICD-10-CM

## 2018-07-26 DIAGNOSIS — N5201 Erectile dysfunction due to arterial insufficiency: Secondary | ICD-10-CM

## 2018-08-07 DIAGNOSIS — N529 Male erectile dysfunction, unspecified: Secondary | ICD-10-CM | POA: Diagnosis not present

## 2018-09-02 DIAGNOSIS — E538 Deficiency of other specified B group vitamins: Secondary | ICD-10-CM | POA: Diagnosis not present

## 2018-09-02 DIAGNOSIS — N529 Male erectile dysfunction, unspecified: Secondary | ICD-10-CM | POA: Diagnosis not present

## 2018-09-03 DIAGNOSIS — M1712 Unilateral primary osteoarthritis, left knee: Secondary | ICD-10-CM | POA: Diagnosis not present

## 2018-09-03 DIAGNOSIS — M1711 Unilateral primary osteoarthritis, right knee: Secondary | ICD-10-CM | POA: Diagnosis not present

## 2018-09-16 DIAGNOSIS — N529 Male erectile dysfunction, unspecified: Secondary | ICD-10-CM | POA: Diagnosis not present

## 2018-09-30 DIAGNOSIS — N529 Male erectile dysfunction, unspecified: Secondary | ICD-10-CM | POA: Diagnosis not present

## 2018-09-30 DIAGNOSIS — E538 Deficiency of other specified B group vitamins: Secondary | ICD-10-CM | POA: Diagnosis not present

## 2018-10-14 DIAGNOSIS — N529 Male erectile dysfunction, unspecified: Secondary | ICD-10-CM | POA: Diagnosis not present

## 2018-10-28 DIAGNOSIS — E538 Deficiency of other specified B group vitamins: Secondary | ICD-10-CM | POA: Diagnosis not present

## 2018-10-28 DIAGNOSIS — N529 Male erectile dysfunction, unspecified: Secondary | ICD-10-CM | POA: Diagnosis not present

## 2018-11-11 DIAGNOSIS — N529 Male erectile dysfunction, unspecified: Secondary | ICD-10-CM | POA: Diagnosis not present

## 2018-11-25 DIAGNOSIS — N529 Male erectile dysfunction, unspecified: Secondary | ICD-10-CM | POA: Diagnosis not present

## 2018-11-25 DIAGNOSIS — E538 Deficiency of other specified B group vitamins: Secondary | ICD-10-CM | POA: Diagnosis not present

## 2018-12-04 DIAGNOSIS — R609 Edema, unspecified: Secondary | ICD-10-CM | POA: Diagnosis not present

## 2018-12-04 DIAGNOSIS — J440 Chronic obstructive pulmonary disease with acute lower respiratory infection: Secondary | ICD-10-CM | POA: Diagnosis not present

## 2018-12-10 NOTE — Progress Notes (Signed)
Cardiology Office Note  Date: 12/12/2018   ID: Devin [name], DOB December 18, 1945, MRN [id number]  PCP: [name], MD  Primary Cardiologist: Rozann Lesches, MD   Chief Complaint  Patient presents with  . Coronary Artery Disease    History of Present Illness: Devin [name] is a 73 y.o. male last seen in February 2019.  He is here for a routine visit.  He tells me that over the last year he has been more short of breath with activity, also intermittently coughing.  He was seen by Dr. Willey Blade fairly recently and has been treated with a pulmonary regimen including steroids, antibiotics, and MDIs.  He is feeling significantly better.  Still reports dependent leg edema and does have a diuretic prescribed by Dr. Willey Blade for use as needed.  From a cardiac perspective he continues on aspirin and Lipitor.  He declines having nitroglycerin available.  His LVEF was 60 to 65% by assessment in 2016 around the time of CABG, and he had no major valvular abnormalities.  We did discuss obtaining a follow-up study to ensure stability.  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  He will be seeing Dr. Willey Blade in a few weeks for lab work and full physical.  Past Medical History:  Diagnosis Date  . Arthritis   . 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  . Essential hypertension   . GERD (gastroesophageal reflux disease)   . Melanoma of back ([place])   . Prostatic hypertrophy   . Sleep apnea    Intolerant of CPAP    Past Surgical History:  Procedure Laterality Date  . ANTERIOR CERVICAL DECOMP/DISCECTOMY FUSION    . CARDIOVERSION N/A 01/28/2015   Procedure: CARDIOVERSION;  Surgeon: Arnoldo Lenis, MD;  Location: [facility];  Service: Endoscopy;  Laterality: N/A;  . CORONARY ARTERY BYPASS GRAFT N/A 12/29/2014   Procedure:  CORONARY ARTERY BYPASS GRAFTING (CABG), ON PUMP, TIMES FOUR, USING LEFT INTERNAL MAMMARY ARTERY, RIGHT GREATER SAPHENOUS VEIN HARVESTED ENDOSCOPICALLY;  Surgeon: Ivin Poot, MD;  Location: [place];  Service: Open Heart Surgery;  Laterality: N/A;  . KNEE ARTHROSCOPY Right   . KNEE CARTILAGE SURGERY Left   . LEFT HEART CATHETERIZATION WITH CORONARY ANGIOGRAM N/A 12/28/2014   Procedure: LEFT HEART CATHETERIZATION WITH CORONARY ANGIOGRAM;  Surgeon: Lorretta Harp, MD;  Location[facility] C[facility] LAB;  Service: Cardiovascular;  Laterality: N/A;  . MELANOMA EXCISION     "back"  . SHOULDER OPEN ROTATOR CUFF REPAIR Left   . TEE WITHOUT CARDIOVERSION N/A 12/29/2014   Procedure: TRANSESOPHAGEAL ECHOCARDIOGRAM (TEE);  Surgeon: Ivin Poot, MD;  Location: [place];  Service: Open Heart Surgery;  Laterality: N/A;  . TEE WITHOUT CARDIOVERSION N/A 01/28/2015   Procedure: TRANSESOPHAGEAL ECHOCARDIOGRAM (TEE);  Surgeon: Arnoldo Lenis, MD;  Location: [facility];  Service: Endoscopy;  Laterality: N/A;    Current Outpatient Medications  Medication Sig Dispense Refill  . aspirin 81 MG tablet Take 81 mg by mouth daily.    Marland Kitchen atorvastatin (LIPITOR) 10 MG tablet Take 10 mg by mouth daily.    . clonazePAM (KLONOPIN) 0.5 MG tablet 0.5 mg at bedtime.     Marland Kitchen doxazosin (CARDURA) 4 MG tablet Take 4 mg by mouth 2 (two) times daily.    Marland Kitchen  Testosterone Cypionate 200 MG/ML KIT Inject 400 mg into the muscle every 14 (fourteen) days.     No current facility-administered medications for this visit.    Allergies:  Lasix [furosemide]   Social History: The patient  reports that he has never smoked. He has never used smokeless tobacco. He reports current alcohol use. He reports that he does not use drugs.   ROS:  Please see the history of present illness. Otherwise, complete review of systems is positive for none.  All other systems are reviewed and negative.    Physical Exam: VS:  BP 130/70 (BP Location: Left Arm)   Pulse 84   Ht 6'  (1.829 m)   Wt 283 lb (128.4 kg)   SpO2 95%   BMI 38.38 kg/m , BMI Body mass index is 38.38 kg/m.  Wt Readings from Last 3 Encounters:  12/12/18 283 lb (128.4 kg)  12/03/17 277 lb (125.6 kg)  11/27/16 267 lb (121.1 kg)    General: Obese male, appears comfortable at rest. HEENT: Conjunctiva and lids normal, oropharynx clear. Neck: Supple, no elevated JVP or carotid bruits, no thyromegaly. Lungs: Rhonchi and scattered crackles at the bases, no wheezing, nonlabored breathing at rest. Cardiac: Regular rate and rhythm, no S3 or significant systolic murmur. Abdomen: Soft, nontender, bowel sounds present. Extremities: 1-2+ lower leg edema bilaterally,, distal pulses 1-2+. Skin: Warm and dry. Musculoskeletal: No kyphosis. Neuropsychiatric: Alert and oriented x3, affect grossly appropriate.  ECG: I personally reviewed the tracing from 12/03/2017 which shows sinus rhythm with right bundle branch block and left anterior fascicular block.   Recent Labwork:  September 2018: BUN 13, creatinine 1.0, potassium 4.8, AST 15, ALT 16, hemoglobin 15.9, platelets 167, cholesterol 121, triglycerides 71, HDL 36, LDL 71  Other Studies Reviewed Today:  TEE3/31/2016: Study Conclusions  - Left ventricle: The cavity size was normal. Wall thickness was normal. Systolic function was normal. The estimated ejection fraction was in the range of 55% to 60%. No evidence of thrombus. - Left atrium: The atrium was mildly dilated. No evidence of thrombus in the atrial cavity or appendage. No evidence of thrombus in the atrial cavity or appendage. - Atrial septum: No defect or patent foramen ovale was identified.  Impressions:  - No evidence of intracardiac thrombus. Will proceed with cardioversion.  Assessment and Plan:  1.  Multivessel CAD status post CABG in 2016.  He reports no definite angina symptoms and continues on aspirin and statin.  ECG reviewed and stable.  2.  Reported dyspnea on  exertion, also intermittent cough and chest congestion.  He is being treated with a pulmonary regimen by Dr. Willey Blade and does report some improvement.  Also has associated leg edema.  We will obtain a follow-up echocardiogram to ensure stability in LVEF and valvular status.  He was given a diuretic to use as needed by Dr. Willey Blade.  3.  Essential hypertension by history, blood pressure is adequately controlled today.  4.  OSA with intolerance to CPAP.  Current medicines were reviewed with the patient today.   Orders Placed This Encounter  Procedures  . EKG 12-Lead  . ECHOCARDIOGRAM COMPLETE    Disposition: Follow-up in 1 year, sooner if needed.  Signed, Satira Sark, MD, [facility] 12/12/2018 9:15 AM    [place] [facility] [address], [place],  [address] Phone: [phone]; Fax: [phone]

## 2018-12-12 ENCOUNTER — Encounter: Payer: Self-pay | Admitting: Cardiology

## 2018-12-12 ENCOUNTER — Ambulatory Visit (INDEPENDENT_AMBULATORY_CARE_PROVIDER_SITE_OTHER): Payer: Medicare Other | Admitting: Cardiology

## 2018-12-12 VITALS — BP 130/70 | HR 84 | Ht 72.0 in | Wt 283.0 lb

## 2018-12-12 DIAGNOSIS — I10 Essential (primary) hypertension: Secondary | ICD-10-CM

## 2018-12-12 DIAGNOSIS — R0609 Other forms of dyspnea: Secondary | ICD-10-CM | POA: Diagnosis not present

## 2018-12-12 DIAGNOSIS — G4733 Obstructive sleep apnea (adult) (pediatric): Secondary | ICD-10-CM | POA: Diagnosis not present

## 2018-12-12 DIAGNOSIS — I25119 Atherosclerotic heart disease of native coronary artery with unspecified angina pectoris: Secondary | ICD-10-CM

## 2018-12-12 NOTE — Patient Instructions (Signed)
Medication Instructions:  Your physician recommends that you continue on your current medications as directed. Please refer to the Current Medication list given to you today.  If you need a refill on your cardiac medications before your next appointment, please call your pharmacy.   Lab work: None today If you have labs (blood work) drawn today and your tests are completely normal, you will receive your results only by: Marland Kitchen MyChart Message (if you have MyChart) OR . A paper copy in the mail If you have any lab test that is abnormal or we need to change your treatment, we will call you to review the results.  Testing/Procedures: Your physician has requested that you have an echocardiogram. Echocardiography is a painless test that uses sound waves to create images of your heart. It provides your doctor with information about the size and shape of your heart and how well your heart's chambers and valves are working. This procedure takes approximately one hour. There are no restrictions for this procedure.    Follow-Up: At [facility], you and your health needs are our priority.  As part of our continuing mission to provide you with exceptional heart care, we have created designated Provider Care Teams.  These Care Teams include your primary Cardiologist (physician) and Advanced Practice Providers (APPs -  Physician Assistants and Nurse Practitioners) who all work together to provide you with the care you need, when you need it. You will need a follow up appointment in 1 years.  Please call our office 2 months in advance to schedule this appointment.  You may see Rozann Lesches, MD or one of the following Advanced Practice Providers on your designated Care Team:   Bernerd Pho, PA-C [facility]) . Ermalinda Barrios, PA-C ([place])  Any Other Special Instructions Will Be Listed Below (If Applicable). None

## 2018-12-19 DIAGNOSIS — Z008 Encounter for other general examination: Secondary | ICD-10-CM | POA: Diagnosis not present

## 2018-12-19 DIAGNOSIS — E785 Hyperlipidemia, unspecified: Secondary | ICD-10-CM | POA: Diagnosis not present

## 2018-12-19 DIAGNOSIS — M1990 Unspecified osteoarthritis, unspecified site: Secondary | ICD-10-CM | POA: Diagnosis not present

## 2018-12-19 DIAGNOSIS — I10 Essential (primary) hypertension: Secondary | ICD-10-CM | POA: Diagnosis not present

## 2018-12-19 DIAGNOSIS — D510 Vitamin B12 deficiency anemia due to intrinsic factor deficiency: Secondary | ICD-10-CM | POA: Diagnosis not present

## 2018-12-19 DIAGNOSIS — N529 Male erectile dysfunction, unspecified: Secondary | ICD-10-CM | POA: Diagnosis not present

## 2018-12-19 DIAGNOSIS — E291 Testicular hypofunction: Secondary | ICD-10-CM | POA: Diagnosis not present

## 2018-12-19 DIAGNOSIS — N528 Other male erectile dysfunction: Secondary | ICD-10-CM | POA: Diagnosis not present

## 2018-12-19 DIAGNOSIS — E538 Deficiency of other specified B group vitamins: Secondary | ICD-10-CM | POA: Diagnosis not present

## 2018-12-19 DIAGNOSIS — R609 Edema, unspecified: Secondary | ICD-10-CM | POA: Diagnosis not present

## 2018-12-19 DIAGNOSIS — Z125 Encounter for screening for malignant neoplasm of prostate: Secondary | ICD-10-CM | POA: Diagnosis not present

## 2018-12-20 ENCOUNTER — Ambulatory Visit (HOSPITAL_COMMUNITY)
Admission: RE | Admit: 2018-12-20 | Discharge: 2018-12-20 | Disposition: A | Discharge status: Home / Self Care | Payer: Medicare Other | Source: Ambulatory Visit | Attending: Cardiology | Admitting: Cardiology

## 2018-12-20 DIAGNOSIS — I25119 Atherosclerotic heart disease of native coronary artery with unspecified angina pectoris: Secondary | ICD-10-CM | POA: Diagnosis not present

## 2018-12-20 DIAGNOSIS — R0609 Other forms of dyspnea: Secondary | ICD-10-CM | POA: Diagnosis not present

## 2018-12-20 NOTE — Progress Notes (Signed)
*  PRELIMINARY RESULTS* Echocardiogram 2D Echocardiogram has been performed.  [name] 12/20/2018, 10:02 AM

## 2018-12-26 DIAGNOSIS — I2510 Atherosclerotic heart disease of native coronary artery without angina pectoris: Secondary | ICD-10-CM | POA: Diagnosis not present

## 2018-12-26 DIAGNOSIS — D649 Anemia, unspecified: Secondary | ICD-10-CM | POA: Diagnosis not present

## 2018-12-26 DIAGNOSIS — Z6836 Body mass index (BMI) 36.0-36.9, adult: Secondary | ICD-10-CM | POA: Diagnosis not present

## 2018-12-26 DIAGNOSIS — J61 Pneumoconiosis due to asbestos and other mineral fibers: Secondary | ICD-10-CM | POA: Diagnosis not present

## 2018-12-26 DIAGNOSIS — R609 Edema, unspecified: Secondary | ICD-10-CM | POA: Diagnosis not present

## 2018-12-27 ENCOUNTER — Encounter: Payer: Self-pay | Admitting: General Surgery

## 2018-12-27 ENCOUNTER — Ambulatory Visit (INDEPENDENT_AMBULATORY_CARE_PROVIDER_SITE_OTHER): Payer: Medicare Other | Admitting: General Surgery

## 2018-12-27 VITALS — BP 130/72 | HR 86 | Temp 98.6°F | Resp 18 | Wt 274.6 lb

## 2018-12-27 DIAGNOSIS — I25119 Atherosclerotic heart disease of native coronary artery with unspecified angina pectoris: Secondary | ICD-10-CM

## 2018-12-27 DIAGNOSIS — L723 Sebaceous cyst: Secondary | ICD-10-CM | POA: Diagnosis not present

## 2018-12-27 MED ORDER — SULFAMETHOXAZOLE-TRIMETHOPRIM 800-160 MG PO TABS: 1.0000 | ORAL_TABLET | Freq: Two times a day (BID) | ORAL | 0 refills | Status: AC

## 2018-12-27 NOTE — Progress Notes (Signed)
Devin [name]; [id number]; 1946/06/13   HPI Patient is a 73 year old white male who was referred to my care by Dr. Willey Blade for evaluation treatment of an infected sebaceous cyst on his back.  He has had multiple cysts on his back for some time now.  This particular 1 started to swell and drain a small amount of purulent fluid.  He is referred for incision and drainage of the cyst.  He only has pain when pressure is applied. Past Medical History:  Diagnosis Date  . Arthritis   . 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  . Essential hypertension   . GERD (gastroesophageal reflux disease)   . Melanoma of back ([place])   . Prostatic hypertrophy   . Sleep apnea    Intolerant of CPAP    Past Surgical History:  Procedure Laterality Date  . ANTERIOR CERVICAL DECOMP/DISCECTOMY FUSION    . CARDIOVERSION N/A 01/28/2015   Procedure: CARDIOVERSION;  Surgeon: Arnoldo Lenis, MD;  Location: [facility];  Service: Endoscopy;  Laterality: N/A;  . CORONARY ARTERY BYPASS GRAFT N/A 12/29/2014   Procedure: CORONARY ARTERY BYPASS GRAFTING (CABG), ON PUMP, TIMES FOUR, USING LEFT INTERNAL MAMMARY ARTERY, RIGHT GREATER SAPHENOUS VEIN HARVESTED ENDOSCOPICALLY;  Surgeon: Ivin Poot, MD;  Location: [place];  Service: Open Heart Surgery;  Laterality: N/A;  . KNEE ARTHROSCOPY Right   . KNEE CARTILAGE SURGERY Left   . LEFT HEART CATHETERIZATION WITH CORONARY ANGIOGRAM N/A 12/28/2014   Procedure: LEFT HEART CATHETERIZATION WITH CORONARY ANGIOGRAM;  Surgeon: Lorretta Harp, MD;  Location: [facility] CATH LAB;  Service: Cardiovascular;  Laterality: N/A;  . MELANOMA EXCISION     "back"  . SHOULDER OPEN ROTATOR CUFF REPAIR Left   . TEE WITHOUT CARDIOVERSION N/A 12/29/2014   Procedure: TRANSESOPHAGEAL ECHOCARDIOGRAM (TEE);  Surgeon: Ivin Poot, MD;  Location: [place];  Service: Open Heart Surgery;  Laterality: N/A;  . TEE  WITHOUT CARDIOVERSION N/A 01/28/2015   Procedure: TRANSESOPHAGEAL ECHOCARDIOGRAM (TEE);  Surgeon: Arnoldo Lenis, MD;  Location: [facility];  Service: Endoscopy;  Laterality: N/A;    Family History  Problem Relation Age of Onset  . Stroke Mother   . Hypertension Mother   . Heart disease Sister        Died from complications of RHD  . Heart disease Brother        Heart failure related to EtOH and smoking  . Stomach cancer Sister     Current Outpatient Medications on File Prior to Visit  Medication Sig Dispense Refill  . cyanocobalamin 1000 MCG tablet Take 1,000 mcg by mouth daily.    Marland Kitchen aspirin 81 MG tablet Take 81 mg by mouth daily.    Marland Kitchen atorvastatin (LIPITOR) 10 MG tablet Take 10 mg by mouth daily.    . clonazePAM (KLONOPIN) 0.5 MG tablet 0.5 mg at bedtime.     Marland Kitchen doxazosin (CARDURA) 4 MG tablet Take 4 mg by mouth 2 (two) times daily.    . Testosterone Cypionate 200 MG/ML KIT Inject 400 mg into the muscle every 14 (fourteen) days.     No current facility-administered medications on file prior to visit.     Allergies  Allergen Reactions  . Lasix [Furosemide] Nausea Only    Social History   Substance and Sexual Activity  Alcohol Use Yes  . Alcohol/week: 0.0 standard drinks  Comment: Occasional    Social History   Tobacco Use  Smoking Status Never Smoker  Smokeless Tobacco Never Used    Review of Systems  Constitutional: Negative.   HENT: Negative.   Eyes: Negative.   Respiratory: Positive for cough.   Cardiovascular: Negative.   Gastrointestinal: Negative.   Genitourinary: Negative.   Musculoskeletal: Positive for back pain.  Skin: Negative.   Neurological: Negative.   Endo/Heme/Allergies: Negative.   Psychiatric/Behavioral: Negative.     Objective   Vitals:   12/27/18 1009  BP: 130/72  Pulse: 86  Resp: 18  Temp: 98.6 F (37 C)    Physical Exam Vitals signs reviewed.  Constitutional:      Appearance: Normal appearance. He is not ill-appearing.   HENT:     Head: Normocephalic and atraumatic.  Cardiovascular:     Rate and Rhythm: Normal rate and regular rhythm.     Heart sounds: Normal heart sounds. No murmur. No friction rub. No gallop.   Pulmonary:     Effort: Pulmonary effort is normal. No respiratory distress.     Breath sounds: Normal breath sounds. No stridor. No wheezing, rhonchi or rales.  Skin:    General: Skin is warm and dry.     Comments: 4 cm ovoid inflamed sebaceous cyst with punctum present in the left mid back.  Area cleaned.  Area freeze applied.  Small incision made and sebum was expressed.  Due to patient tolerance, I could not fully express all the wall of the sebaceous cyst.  Neosporin ointment and dry sterile dressing were applied.  Neurological:     Mental Status: He is alert and oriented to person, place, and time.   Primary care notes reviewed  Assessment  Infected sebaceous cyst, back Plan   Bactrim DS 1 tablet twice a day for 1 week.  Patient is to keep the wound clean and dry twice a day and apply Neosporin ointment.  I will see him in follow-up in 1 week.

## 2018-12-27 NOTE — Patient Instructions (Signed)
Epidermal Cyst    An epidermal cyst is a small, painless lump under your skin. The cyst contains a grayish-white, bad-smelling substance (keratin). Do not try to pop or open an epidermal cyst yourself.  What are the causes?   A blocked hair follicle.   A hair that curls and re-enters the skin instead of growing straight out of the skin.   A blocked pore.   Irritated skin.   An injury to the skin.   Certain conditions that are passed along from parent to child (inherited).   Human papillomavirus (HPV).   Long-term sun damage to the skin.  What increases the risk?   Having acne.   Being overweight.   Being 30-40 years old.  What are the signs or symptoms?  These cysts are usually harmless, but they can get infected. Symptoms of infection may include:   Redness.   Inflammation.   Tenderness.   Warmth.   Fever.   A grayish-white, bad-smelling substance drains from the cyst.   Pus drains from the cyst.  How is this treated?  In many cases, epidermal cysts go away on their own without treatment. If a cyst becomes infected, treatment may include:   Opening and draining the cyst, done by a doctor. After draining, you may need minor surgery to remove the rest of the cyst.   Antibiotic medicine.   Shots of medicines (steroids) that help to reduce inflammation.   Surgery to remove the cyst. Surgery may be done if the cyst:  ? Becomes large.  ? Bothers you.  ? Has a chance of turning into cancer.   Do not try to open a cyst yourself.  Follow these instructions at home:   Take over-the-counter and prescription medicines only as told by your doctor.   If you were prescribed an antibiotic medicine, take it it as told by your doctor. Do not stop using the antibiotic even if you start to feel better.   Keep the area around your cyst clean and dry.   Wear loose, dry clothing.   Avoid touching your cyst.   Check your cyst every day for signs of infection. Check for:  ? Redness, swelling, or pain.  ? Fluid  or blood.  ? Warmth.  ? Pus or a bad smell.   Keep all follow-up visits as told by your doctor. This is important.  How is this prevented?   Wear clean, dry, clothing.   Avoid wearing tight clothing.   Keep your skin clean and dry. Take showers or baths every day.  Contact a doctor if:   Your cyst has symptoms of infection.   Your condition does not improve or gets worse.   You have a cyst that looks different from other cysts you have had.   You have a fever.  Get help right away if:   Redness spreads from the cyst into the area close by.  Summary   An epidermal cyst is a sac made of skin tissue.   If a cyst becomes infected, treatment may include surgery to open and drain the cyst, or to remove it.   Take over-the-counter and prescription medicines only as told by your doctor.   Contact a doctor if your condition is not improving or is getting worse.   Keep all follow-up visits as told by your doctor. This is important.  This information is not intended to replace advice given to you by your health care provider. Make sure you discuss any   questions you have with your health care provider.  Document Released: 11/23/2004 Document Revised: 04/29/2018 Document Reviewed: 08/18/2015  Elsevier Interactive Patient Education  2019 [employer]

## 2019-01-02 ENCOUNTER — Encounter: Payer: Self-pay | Admitting: General Surgery

## 2019-01-02 ENCOUNTER — Ambulatory Visit (INDEPENDENT_AMBULATORY_CARE_PROVIDER_SITE_OTHER): Payer: Medicare Other | Admitting: General Surgery

## 2019-01-02 VITALS — BP 148/80 | HR 92 | Temp 96.9°F | Resp 20 | Wt 273.0 lb

## 2019-01-02 DIAGNOSIS — L723 Sebaceous cyst: Secondary | ICD-10-CM | POA: Diagnosis not present

## 2019-01-02 DIAGNOSIS — I25119 Atherosclerotic heart disease of native coronary artery with unspecified angina pectoris: Secondary | ICD-10-CM

## 2019-01-02 NOTE — Progress Notes (Signed)
Subjective:     Devin [name]  Here for follow-up wound check.  Patient doing well.  His wife has been expressing sebum from his wound.  He states drainage has significantly decreased.  He is just finishing his antibiotics. Objective:    BP (!) 148/80 (BP Location: Left Arm, Patient Position: Sitting, Cuff Size: Large)   Pulse 92   Temp (!) 96.9 F (36.1 C) (Temporal)   Resp 20   Wt 273 lb (123.8 kg)   BMI 37.03 kg/m   General:  alert, cooperative and no distress  Back wound shows minimal residual sebaceous cyst with sebum present.  This was fully expressed.  Significantly improved erythema.  No purulent drainage noted.     Assessment:    Infected sebaceous cyst, back, resolving    Plan:   Continue local wound care.  Finish antibiotic course.  Follow-up here as needed.  No need for surgical intervention at this time.

## 2019-01-17 DIAGNOSIS — N529 Male erectile dysfunction, unspecified: Secondary | ICD-10-CM | POA: Diagnosis not present

## 2019-01-17 DIAGNOSIS — E538 Deficiency of other specified B group vitamins: Secondary | ICD-10-CM | POA: Diagnosis not present

## 2019-01-17 DIAGNOSIS — E291 Testicular hypofunction: Secondary | ICD-10-CM | POA: Diagnosis not present

## 2019-02-24 DIAGNOSIS — E538 Deficiency of other specified B group vitamins: Secondary | ICD-10-CM | POA: Diagnosis not present

## 2019-02-24 DIAGNOSIS — N529 Male erectile dysfunction, unspecified: Secondary | ICD-10-CM | POA: Diagnosis not present

## 2019-04-04 ENCOUNTER — Ambulatory Visit (INDEPENDENT_AMBULATORY_CARE_PROVIDER_SITE_OTHER): Payer: Medicare Other | Admitting: Urology

## 2019-04-04 DIAGNOSIS — R351 Nocturia: Secondary | ICD-10-CM

## 2019-04-04 DIAGNOSIS — N401 Enlarged prostate with lower urinary tract symptoms: Secondary | ICD-10-CM

## 2019-04-04 DIAGNOSIS — N5201 Erectile dysfunction due to arterial insufficiency: Secondary | ICD-10-CM

## 2019-04-04 DIAGNOSIS — E291 Testicular hypofunction: Secondary | ICD-10-CM

## 2019-04-04 DIAGNOSIS — E538 Deficiency of other specified B group vitamins: Secondary | ICD-10-CM | POA: Diagnosis not present

## 2019-06-19 DIAGNOSIS — E538 Deficiency of other specified B group vitamins: Secondary | ICD-10-CM | POA: Diagnosis not present

## 2019-07-11 ENCOUNTER — Ambulatory Visit (INDEPENDENT_AMBULATORY_CARE_PROVIDER_SITE_OTHER): Payer: Medicare Other | Admitting: Urology

## 2019-07-11 ENCOUNTER — Other Ambulatory Visit: Payer: Self-pay

## 2019-07-11 DIAGNOSIS — I2510 Atherosclerotic heart disease of native coronary artery without angina pectoris: Secondary | ICD-10-CM

## 2019-07-30 ENCOUNTER — Ambulatory Visit (INDEPENDENT_AMBULATORY_CARE_PROVIDER_SITE_OTHER): Payer: Medicare Other | Admitting: Urology

## 2019-07-30 DIAGNOSIS — E291 Testicular hypofunction: Secondary | ICD-10-CM

## 2019-07-31 DIAGNOSIS — Z23 Encounter for immunization: Secondary | ICD-10-CM | POA: Diagnosis not present

## 2019-07-31 DIAGNOSIS — E538 Deficiency of other specified B group vitamins: Secondary | ICD-10-CM | POA: Diagnosis not present

## 2019-08-12 ENCOUNTER — Ambulatory Visit (INDEPENDENT_AMBULATORY_CARE_PROVIDER_SITE_OTHER): Payer: Medicare Other | Admitting: Urology

## 2019-08-12 ENCOUNTER — Other Ambulatory Visit: Payer: Self-pay

## 2019-08-12 DIAGNOSIS — E291 Testicular hypofunction: Secondary | ICD-10-CM | POA: Diagnosis not present

## 2019-08-26 ENCOUNTER — Ambulatory Visit (INDEPENDENT_AMBULATORY_CARE_PROVIDER_SITE_OTHER): Payer: Medicare Other | Admitting: Urology

## 2019-08-26 ENCOUNTER — Other Ambulatory Visit: Payer: Self-pay

## 2019-08-26 DIAGNOSIS — E538 Deficiency of other specified B group vitamins: Secondary | ICD-10-CM | POA: Diagnosis not present

## 2019-08-26 DIAGNOSIS — E291 Testicular hypofunction: Secondary | ICD-10-CM

## 2019-09-09 DIAGNOSIS — N529 Male erectile dysfunction, unspecified: Secondary | ICD-10-CM | POA: Diagnosis not present

## 2019-09-23 DIAGNOSIS — N529 Male erectile dysfunction, unspecified: Secondary | ICD-10-CM | POA: Diagnosis not present

## 2019-09-23 DIAGNOSIS — E538 Deficiency of other specified B group vitamins: Secondary | ICD-10-CM | POA: Diagnosis not present

## 2019-10-07 DIAGNOSIS — E291 Testicular hypofunction: Secondary | ICD-10-CM | POA: Diagnosis not present

## 2019-10-10 DIAGNOSIS — R972 Elevated prostate specific antigen [PSA]: Secondary | ICD-10-CM | POA: Diagnosis not present

## 2019-10-10 DIAGNOSIS — E291 Testicular hypofunction: Secondary | ICD-10-CM | POA: Diagnosis not present

## 2019-10-14 ENCOUNTER — Other Ambulatory Visit: Payer: Self-pay | Admitting: Urology

## 2019-10-17 ENCOUNTER — Ambulatory Visit (INDEPENDENT_AMBULATORY_CARE_PROVIDER_SITE_OTHER): Payer: Medicare Other | Admitting: Urology

## 2019-10-17 ENCOUNTER — Encounter: Payer: Self-pay | Admitting: Urology

## 2019-10-17 VITALS — BP 158/74 | HR 90 | Temp 97.3°F

## 2019-10-17 DIAGNOSIS — N138 Other obstructive and reflux uropathy: Secondary | ICD-10-CM

## 2019-10-17 DIAGNOSIS — N401 Enlarged prostate with lower urinary tract symptoms: Secondary | ICD-10-CM

## 2019-10-17 DIAGNOSIS — N5201 Erectile dysfunction due to arterial insufficiency: Secondary | ICD-10-CM | POA: Diagnosis not present

## 2019-10-17 DIAGNOSIS — R351 Nocturia: Secondary | ICD-10-CM | POA: Diagnosis not present

## 2019-10-17 DIAGNOSIS — I25119 Atherosclerotic heart disease of native coronary artery with unspecified angina pectoris: Secondary | ICD-10-CM

## 2019-10-17 DIAGNOSIS — E291 Testicular hypofunction: Secondary | ICD-10-CM

## 2019-10-17 NOTE — Assessment & Plan Note (Addendum)
No change in therapy but he is not active at this time.

## 2019-10-17 NOTE — Progress Notes (Signed)
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 BPH with urinary obstruction   3. Nocturia   4. Erectile dysfunction due to arterial insufficiency      [name] returns today in f/u.  He has been on TRT with 429m IM q2wks.  His T is 1341 4 days post injection.  His Hgb is 15.7.   He is doing well on therapy.  He has had progressive ED and has not been using the VED.  He remains on doxazosin 440mbid for his voiding symptoms and is doing well but he has increased frequency because of the use of Torsemide for fluid retention.   He thinks he had COVID in February and has had some issues after that.  .    IPSS    Row Name 10/17/19 1400         International Prostate Symptom Score   How often have you had the sensation of not emptying your bladder?  Less than 1 in 5     How often have you had to urinate less than every two hours?  About half the time     How often have you found you stopped and started again several times when you urinated?  Less than half the time     How often have you found it difficult to postpone urination?  Less than 1 in 5 times     How often have you had a weak urinary stream?  Less than 1 in 5 times     How often have you had to strain to start urination?  Less than 1 in 5 times     How many times did you typically get up at night to urinate?  1 Time     Total IPSS Score  10       Quality of Life due to urinary symptoms   If you were to spend the rest of your life with your urinary condition just the way it is now how would you feel about that?  Mostly Satisfied         ROS:  Review of Systems  Constitutional:       Hot flashes.   Respiratory: Positive for shortness of breath.   Cardiovascular: Positive for chest pain and leg swelling.  Gastrointestinal: Negative for constipation and diarrhea.  Genitourinary: Positive for frequency.    Allergies  Allergen Reactions  . Lasix [Furosemide] Nausea Only    Outpatient Encounter Medications as of  10/17/2019  Medication Sig Note  . aspirin 81 MG tablet Take 81 mg by mouth daily.   . Marland Kitchentorvastatin (LIPITOR) 10 MG tablet Take 10 mg by mouth daily.   . clonazePAM (KLONOPIN) 0.5 MG tablet 0.5 mg at bedtime.  02/24/2015: Received from: External Pharmacy  . cyanocobalamin (,VITAMIN B-12,) 1000 MCG/ML injection INJECT 1ML AS DIRECTED EVERY 4 WEEKS   . doxazosin (CARDURA) 4 MG tablet Take 4 mg by mouth 2 (two) times daily.   . Marland Kitchenestosterone cypionate (DEPOTESTOSTERONE CYPIONATE) 200 MG/ML injection INJECT 2 ML IM EVERY 2 WEEKS   . torsemide (DEMADEX) 20 MG tablet Take 20 mg by mouth daily.   . cyanocobalamin 1000 MCG tablet Take 1,000 mcg by mouth daily.   . Marland Kitchenestosterone cypionate (DEPOTESTOSTERONE CYPIONATE) 200 MG/ML injection SMARTSIG:1 Milliliter(s) IM Once a Week   . Testosterone Cypionate 200 MG/ML KIT Inject 400 mg into the muscle every 14 (fourteen) days. 01/23/2015: Patient is behind about a month worth of doses   No facility-administered  encounter medications on file as of 10/17/2019.    Past Medical History:  Diagnosis Date  . Arthritis   . Asbestosis ([place])   . 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  . Essential hypertension   . GERD (gastroesophageal reflux disease)   . Melanoma of back ([place])   . Prostatic hypertrophy   . Sleep apnea    Intolerant of CPAP    Past Surgical History:  Procedure Laterality Date  . ANTERIOR CERVICAL DECOMP/DISCECTOMY FUSION    . CARDIOVERSION N/A 01/28/2015   Procedure: CARDIOVERSION;  Surgeon: Arnoldo Lenis, MD;  Location: [facility];  Service: Endoscopy;  Laterality: N/A;  . CORONARY ARTERY BYPASS GRAFT N/A 12/29/2014   Procedure: CORONARY ARTERY BYPASS GRAFTING (CABG), ON PUMP, TIMES FOUR, USING LEFT INTERNAL MAMMARY ARTERY, RIGHT GREATER SAPHENOUS VEIN HARVESTED ENDOSCOPICALLY;  Surgeon: Ivin Poot, MD;  Location: [place];  Service: Open Heart Surgery;   Laterality: N/A;  . KNEE ARTHROSCOPY Right   . KNEE CARTILAGE SURGERY Left   . LEFT HEART CATHETERIZATION WITH CORONARY ANGIOGRAM N/A 12/28/2014   Procedure: LEFT HEART CATHETERIZATION WITH CORONARY ANGIOGRAM;  Surgeon: Lorretta Harp, MD;  Location: [facility] CATH LAB;  Service: Cardiovascular;  Laterality: N/A;  . MELANOMA EXCISION     "back"  . SHOULDER OPEN ROTATOR CUFF REPAIR Left   . TEE WITHOUT CARDIOVERSION N/A 12/29/2014   Procedure: TRANSESOPHAGEAL ECHOCARDIOGRAM (TEE);  Surgeon: Ivin Poot, MD;  Location: [place];  Service: Open Heart Surgery;  Laterality: N/A;  . TEE WITHOUT CARDIOVERSION N/A 01/28/2015   Procedure: TRANSESOPHAGEAL ECHOCARDIOGRAM (TEE);  Surgeon: Arnoldo Lenis, MD;  Location[facility];  Service: Endoscopy;  Laterality: N/A;    Social History   Socioeconomic History  . Marital status: Married    Spouse name: Not on file  . Number of children: 7  . Years of education: Not on file  . Highest education level: Not on file  Occupational History  . Not on file  Tobacco Use  . Smoking status: Never Smoker  . Smokeless tobacco: Never Used  Substance and Sexual Activity  . Alcohol use: Yes    Alcohol/week: 0.0 standard drinks    Comment: Occasional  . Drug use: No  . Sexual activity: Yes    Partners: Female  Other Topics Concern  . Not on file  Social History Narrative   Lives at home with wife.  They have seven children.     Social Determinants of Health   Financial Resource Strain:   . Difficulty of Paying Living Expenses: Not on file  Food Insecurity:   . Worried About Charity fundraiser in the Last Year: Not on file  . Ran Out of Food in the Last Year: Not on file  Transportation Needs:   . Lack of Transportation (Medical): Not on file  . Lack of Transportation (Non-Medical): Not on file  Physical Activity:   . Days of Exercise per Week: Not on file  . Minutes of Exercise per Session: Not on file  Stress:   . Feeling of Stress : Not on file   Social Connections:   . Frequency of Communication with Friends and Family: Not on file  . Frequency of Social Gatherings with Friends and Family: Not on file  . Attends Religious Services: Not on file  . Active Member of Clubs or Organizations: Not on file  .  Attends Archivist Meetings: Not on file  . Marital Status: Not on file  Intimate Partner Violence:   . Fear of Current or Ex-Partner: Not on file  . Emotionally Abused: Not on file  . Physically Abused: Not on file  . Sexually Abused: Not on file    Family History  Problem Relation Age of Onset  . Stroke Mother   . Hypertension Mother   . Heart disease Sister        Died from complications of RHD  . Heart disease Brother        Heart failure related to EtOH and smoking  . Stomach cancer Sister        Objective: Vitals:   10/17/19 1350  BP: (!) 158/74  Pulse: 90  Temp: (!) 97.3 F (36.3 C)     Physical Exam Vitals reviewed.  Constitutional:      Appearance: Normal appearance.  Neurological:     Mental Status: He is alert.     Lab Results:  Testosterone is 1341 4-5 days post injection. Hgb is 15.7. PSA is 1.0.  BMET No results for input(s): NA, K, CL, CO2, GLUCOSE, BUN, CREATININE, CALCIUM in the last 72 hours. PSA      Studies/Results: No results found.    Assessment & Plan: Hypogonadism in male Testosterone refilled.    BPH with urinary obstruction He will continue Doxazosin.  PSA is normal.  Exam at f/u.   Erectile dysfunction due to arterial insufficiency No change in therapy but he is not active at this time.     Orders Placed This Encounter  Procedures  . Hemoglobin and hematocrit, blood    Standing Status:   Future    Standing Expiration Date:   10/16/2020  . Testosterone    Standing Status:   Future    Standing Expiration Date:   10/16/2020    Return in about 6 months (around 04/16/2020).   CC: [name], MD       Irine Seal 10/17/2019

## 2019-10-17 NOTE — Assessment & Plan Note (Signed)
Testosterone refilled

## 2019-10-17 NOTE — Assessment & Plan Note (Addendum)
He will continue Doxazosin.  PSA is normal.  Exam at f/u.

## 2019-10-27 ENCOUNTER — Telehealth: Payer: Self-pay | Admitting: Cardiology

## 2019-10-27 NOTE — Telephone Encounter (Signed)
[id number] 

## 2019-10-27 NOTE — Telephone Encounter (Signed)
Patient has been retaining a lot of fluid and BP been high

## 2019-10-27 NOTE — Telephone Encounter (Signed)
Patient has been retaining fluid and Blood pressure has been high

## 2019-10-28 NOTE — Telephone Encounter (Signed)
Patient returned call

## 2019-10-29 NOTE — Telephone Encounter (Signed)
Called pt. No answer. Left message for pt tor return call.

## 2019-10-29 NOTE — Telephone Encounter (Signed)
Spoke with pt. He is concerned about his blood pressure. He does not have a blood pressure log, but gave me 155/71. He also stated that he has retained fluid in feet and ankles more recently than he normally does. I asked him to keep track of daily weights and bring by office next week. He is only able to take torsemide 10 mg about 4 times weekly as the 20 mg dose makes him extremely nauseas. He will keep record of blood pressures & weights for 1 week and drop off.

## 2019-11-18 ENCOUNTER — Telehealth: Payer: Self-pay

## 2019-11-18 DIAGNOSIS — E291 Testicular hypofunction: Secondary | ICD-10-CM | POA: Diagnosis not present

## 2019-11-18 NOTE — Telephone Encounter (Signed)
[id number] 

## 2019-11-18 NOTE — Telephone Encounter (Signed)
Patient walked in with BP log.Concerned since he has had Covid his BP is up. He states his weight is up to 282 lbs and feels hands and feet swollen. I will forward BP log to Dr.McDowell for advice.

## 2019-11-18 NOTE — Telephone Encounter (Signed)
Received fax in [place] office today of home vital signs on [name].  Telephone note reviewed.  Blood pressure elevation is not in an acutely dangerous range, but not optimally controlled with systolics in the [id number] to [id number].  He has not been seen in a year.  I do not have any recent lab work for review.  We may need to start him on additional antihypertensive therapy if this has not already been addressed by his PCP Dr. Willey Blade.  If he has not had recent lab work, please get BMET to assess renal function and potassium as he may be a good candidate for ARB.  Schedule an office visit with [place] for further evaluation.

## 2019-11-19 NOTE — Telephone Encounter (Signed)
Returning call.

## 2019-11-19 NOTE — Telephone Encounter (Signed)
Returned pt call. No answer, left message for pt to return call.  

## 2019-11-21 NOTE — Telephone Encounter (Signed)
I spoke with patient, he stated he would not take blood pressure medication. He will discuss this at his apt with Dr.McDowell

## 2019-11-21 NOTE — Telephone Encounter (Signed)
Returning call-- [phone]

## 2019-12-02 DIAGNOSIS — E291 Testicular hypofunction: Secondary | ICD-10-CM | POA: Diagnosis not present

## 2019-12-16 ENCOUNTER — Ambulatory Visit (INDEPENDENT_AMBULATORY_CARE_PROVIDER_SITE_OTHER): Payer: Medicare Other | Admitting: Cardiology

## 2019-12-16 ENCOUNTER — Other Ambulatory Visit: Payer: Self-pay

## 2019-12-16 ENCOUNTER — Encounter: Payer: Self-pay | Admitting: Cardiology

## 2019-12-16 VITALS — BP 124/69 | HR 79 | Temp 99.0°F | Ht 72.0 in | Wt 294.0 lb

## 2019-12-16 DIAGNOSIS — E291 Testicular hypofunction: Secondary | ICD-10-CM | POA: Diagnosis not present

## 2019-12-16 DIAGNOSIS — I10 Essential (primary) hypertension: Secondary | ICD-10-CM

## 2019-12-16 DIAGNOSIS — R600 Localized edema: Secondary | ICD-10-CM | POA: Diagnosis not present

## 2019-12-16 DIAGNOSIS — I25119 Atherosclerotic heart disease of native coronary artery with unspecified angina pectoris: Secondary | ICD-10-CM | POA: Diagnosis not present

## 2019-12-16 DIAGNOSIS — E782 Mixed hyperlipidemia: Secondary | ICD-10-CM

## 2019-12-16 NOTE — Progress Notes (Signed)
Cardiology Office Note  Date: 12/16/2019   ID: Devin [name], DOB Dec 08, 1945, MRN [id number]  PCP:  [name], MD  Cardiologist:  Rozann Lesches, MD Electrophysiologist:  None   Chief Complaint  Patient presents with  . Cardiac follow-up    History of Present Illness: Devin [name] is a 74 y.o. male last seen in February 2020.  He presents for a routine visit.  Reports no angina symptoms, not using any nitroglycerin at this point.  He has chronic stable dyspnea on exertion with a history of asbestosis, also possible component of reactive airways disease.  He sees Dr. Willey Blade, currently on ProAir MDI and has been treated with steroids in the past.  He also sees a pulmonologist in [place] once a year.  He has chronic leg swelling, uses Demadex currently at 10 mg once a day and reports reasonable results.  He did have a follow-up echocardiogram in February 2020 showed LVEF 55 to 60% with low normal RV contraction.  I reviewed recent home blood pressure and heart rate checks.  His current regimen includes aspirin, Lipitor, and Demadex.  He is due for follow-up lab work and physical with Dr. Willey Blade in the next few weeks.  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  Past Medical History:  Diagnosis Date  . Arthritis   . Asbestosis ([place])   . 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  . Essential hypertension   . GERD (gastroesophageal reflux disease)   . Melanoma of back ([place])   . Prostatic hypertrophy   . Sleep apnea    Intolerant of CPAP    Past Surgical History:  Procedure Laterality Date  . ANTERIOR CERVICAL DECOMP/DISCECTOMY FUSION    . CARDIOVERSION N/A 01/28/2015   Procedure: CARDIOVERSION;  Surgeon: Arnoldo Lenis, MD;  [facility];  Service: Endoscopy;  Laterality: N/A;  . CORONARY ARTERY  BYPASS GRAFT N/A 12/29/2014   Procedure: CORONARY ARTERY BYPASS GRAFTING (CABG), ON PUMP, TIMES FOUR, USING LEFT INTERNAL MAMMARY ARTERY, RIGHT GREATER SAPHENOUS VEIN HARVESTED ENDOSCOPICALLY;  Surgeon: Ivin Poot, MD;  Location: [place];  Service: Open Heart Surgery;  Laterality: N/A;  . KNEE ARTHROSCOPY Right   . KNEE CARTILAGE SURGERY Left   . LEFT HEART CATHETERIZATION WITH CORONARY ANGIOGRAM N/A 12/28/2014   Procedure: LEFT HEART CATHETERIZATION WITH CORONARY ANGIOGRAM;  Surgeon: Lorretta Harp, MD;  [facility];  Service: Cardiovascular;  Laterality: N/A;  . MELANOMA EXCISION     "back"  . SHOULDER OPEN ROTATOR CUFF REPAIR Left   . TEE WITHOUT CARDIOVERSION N/A 12/29/2014   Procedure: TRANSESOPHAGEAL ECHOCARDIOGRAM (TEE);  Surgeon: Ivin Poot, MD;  Location: [place];  Service: Open Heart Surgery;  Laterality: N/A;  . TEE WITHOUT CARDIOVERSION N/A 01/28/2015   Procedure: TRANSESOPHAGEAL ECHOCARDIOGRAM (TEE);  Surgeon: Arnoldo Lenis, MD;  [facility];  Service: Endoscopy;  Laterality: N/A;    Current Outpatient Medications  Medication Sig Dispense Refill  . aspirin 81 MG tablet Take 81 mg by mouth daily.    Marland Kitchen atorvastatin (LIPITOR) 10 MG tablet Take 10 mg by mouth daily.    . clonazePAM (KLONOPIN) 0.5 MG tablet 0.5 mg at bedtime.     . cyanocobalamin (,VITAMIN B-12,) 1000 MCG/ML injection INJECT 1ML AS DIRECTED EVERY 4 WEEKS    .  doxazosin (CARDURA) 4 MG tablet Take 4 mg by mouth 2 (two) times daily.    Marland Kitchen PROAIR HFA 108 (90 Base) MCG/ACT inhaler     . testosterone cypionate (DEPOTESTOSTERONE CYPIONATE) 200 MG/ML injection SMARTSIG:1 Milliliter(s) IM Once a Week    . torsemide (DEMADEX) 20 MG tablet Take 20 mg by mouth daily.    . cyanocobalamin 1000 MCG tablet Take 1,000 mcg by mouth daily.    Marland Kitchen testosterone cypionate (DEPOTESTOSTERONE CYPIONATE) 200 MG/ML injection INJECT 2 ML IM EVERY 2 WEEKS    . Testosterone Cypionate 200 MG/ML KIT Inject 400 mg into the muscle  every 14 (fourteen) days.     No current facility-administered medications for this visit.   Allergies:  Lasix [furosemide]   ROS: Chronic shortness of breath, intermittent cough.  He thinks that he may have had COVID-19 in January or February of last year, was never tested.  Physical Exam: VS:  BP 124/69   Pulse 79   Temp 99 F (37.2 C)   Ht 6' (1.829 m)   Wt 294 lb (133.4 kg)   SpO2 95%   BMI 39.87 kg/m , BMI Body mass index is 39.87 kg/m.  Wt Readings from Last 3 Encounters:  12/16/19 294 lb (133.4 kg)  01/02/19 273 lb (123.8 kg)  12/27/18 274 lb 9.6 oz (124.6 kg)    General: Patient appears comfortable at rest. HEENT: Conjunctiva and lids normal, wearing a mask. Neck: Supple, no elevated JVP or carotid bruits, no thyromegaly. Lungs: Decreased breath sounds but no crackles or wheezing, nonlabored breathing at rest. Cardiac: Regular rate and rhythm, no S3 or significant systolic murmur. Abdomen: Obese, nontender, bowel sounds present. Extremities: 2+ lower leg edema, distal pulses 1-2+. Skin: Warm and dry. Musculoskeletal: No kyphosis. Neuropsychiatric: Alert and oriented x3, affect grossly appropriate.  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  Recent Labwork:  December 2020: Hemoglobin 15.7  Other Studies Reviewed Today:  Echocardiogram 12/20/2018: 1. The left ventricle has normal systolic function, with an ejection  fraction of 55-60%. The cavity size was normal. There is mildly increased  left ventricular wall thickness. Left ventricular diastolic parameters  were normal.  2. The right ventricle has low normal systolic function. The cavity was  mildly enlarged. There is no increase in right ventricular wall thickness.  3. The mitral valve is normal in structure. Moderate thickening of the  mitral valve leaflet. No evidence of mitral valve  stenosis.  4. The tricuspid valve is normal in structure.  5. The aortic valve is tricuspid Moderate thickening of the aortic valve  no stenosis of the aortic valve.  6. The aortic root is normal in size and structure.  7. The inferior vena cava was dilated in size with >50% respiratory  variability.   Assessment and Plan:  1.  Multivessel CAD status post CABG in 2016.  He does not report any progressive angina or nitroglycerin use.  Currently on aspirin and Lipitor.  Follow-up ECG reviewed and stable.  Due for follow-up lab work and physical with Dr. Willey Blade in the next few weeks.  2.  History of hyperlipidemia, currently on low-dose Lipitor.  He states that he has had some leg pain with this, stopped the medication for a few days and felt better.  I asked him to talk with Dr. Willey Blade about possibly considering a switch to low-dose Crestor if his lipids are in  good order.  3.  Chronic leg swelling, multifactorial.  He has low-dose Demadex.  Echocardiogram from last year showed LVEF 55 to 60% with normal low normal RV contraction.  4.  Chronic shortness of breath.  Reports history of asbestosis and follow-up with a pulmonologist in [place].  Also uses ProAir MDI per Dr. Willey Blade.  5.  Essential hypertension, blood pressure is well controlled today.  No changes made in current regimen.  Medication Adjustments/Labs and Tests Ordered: Current medicines are reviewed at length with the patient today.  Concerns regarding medicines are outlined above.   Tests Ordered: Orders Placed This Encounter  Procedures  . EKG 12-Lead    Medication Changes: No orders of the defined types were placed in this encounter.   Disposition:  Follow up 1 year in the [place] office.  Signed, Satira Sark, MD, [facility] 12/16/2019 2:43 PM    [place] [facility] [address], [place], [place] [address] Phone: [phone]; Fax: [phone]

## 2019-12-16 NOTE — Patient Instructions (Signed)
Medication Instructions:  Your physician recommends that you continue on your current medications as directed. Please refer to the Current Medication list given to you today.  *If you need a refill on your cardiac medications before your next appointment, please call your pharmacy*  Lab Work: None today If you have labs (blood work) drawn today and your tests are completely normal, you will receive your results only by: . MyChart Message (if you have MyChart) OR . A paper copy in the mail If you have any lab test that is abnormal or we need to change your treatment, we will call you to review the results.  Testing/Procedures: None today  Follow-Up: At [facility], you and your health needs are our priority.  As part of our continuing mission to provide you with exceptional heart care, we have created designated Provider Care Teams.  These Care Teams include your primary Cardiologist (physician) and Advanced Practice Providers (APPs -  Physician Assistants and Nurse Practitioners) who all work together to provide you with the care you need, when you need it.  Your next appointment:   12 month(s)  The format for your next appointment:   In Person  Provider:   Samuel McDowell, MD  Other Instructions None     Thank you for choosing [facility] !        

## 2019-12-26 DIAGNOSIS — Z77090 Contact with and (suspected) exposure to asbestos: Secondary | ICD-10-CM | POA: Diagnosis not present

## 2019-12-26 DIAGNOSIS — E785 Hyperlipidemia, unspecified: Secondary | ICD-10-CM | POA: Diagnosis not present

## 2019-12-26 DIAGNOSIS — Z125 Encounter for screening for malignant neoplasm of prostate: Secondary | ICD-10-CM | POA: Diagnosis not present

## 2019-12-26 DIAGNOSIS — I10 Essential (primary) hypertension: Secondary | ICD-10-CM | POA: Diagnosis not present

## 2019-12-26 DIAGNOSIS — E291 Testicular hypofunction: Secondary | ICD-10-CM | POA: Diagnosis not present

## 2019-12-26 DIAGNOSIS — Z1152 Encounter for screening for COVID-19: Secondary | ICD-10-CM | POA: Diagnosis not present

## 2019-12-26 DIAGNOSIS — I2510 Atherosclerotic heart disease of native coronary artery without angina pectoris: Secondary | ICD-10-CM | POA: Diagnosis not present

## 2019-12-26 DIAGNOSIS — D510 Vitamin B12 deficiency anemia due to intrinsic factor deficiency: Secondary | ICD-10-CM | POA: Diagnosis not present

## 2019-12-30 DIAGNOSIS — E291 Testicular hypofunction: Secondary | ICD-10-CM | POA: Diagnosis not present

## 2019-12-30 DIAGNOSIS — E538 Deficiency of other specified B group vitamins: Secondary | ICD-10-CM | POA: Diagnosis not present

## 2020-01-02 DIAGNOSIS — I10 Essential (primary) hypertension: Secondary | ICD-10-CM | POA: Diagnosis not present

## 2020-01-02 DIAGNOSIS — J61 Pneumoconiosis due to asbestos and other mineral fibers: Secondary | ICD-10-CM | POA: Diagnosis not present

## 2020-01-02 DIAGNOSIS — R609 Edema, unspecified: Secondary | ICD-10-CM | POA: Diagnosis not present

## 2020-01-02 DIAGNOSIS — E785 Hyperlipidemia, unspecified: Secondary | ICD-10-CM | POA: Diagnosis not present

## 2020-01-02 DIAGNOSIS — I2510 Atherosclerotic heart disease of native coronary artery without angina pectoris: Secondary | ICD-10-CM | POA: Diagnosis not present

## 2020-01-09 ENCOUNTER — Other Ambulatory Visit: Payer: Self-pay

## 2020-01-09 DIAGNOSIS — N138 Other obstructive and reflux uropathy: Secondary | ICD-10-CM

## 2020-01-09 DIAGNOSIS — N401 Enlarged prostate with lower urinary tract symptoms: Secondary | ICD-10-CM

## 2020-01-09 MED ORDER — DOXAZOSIN MESYLATE 4 MG PO TABS: 4.0000 mg | ORAL_TABLET | Freq: Two times a day (BID) | ORAL | 11 refills | Status: DC

## 2020-01-09 NOTE — Progress Notes (Signed)
Wife called for refill on doxazosin. Refill sent in to [facility] on [address] In [place] per wife request.

## 2020-01-12 DIAGNOSIS — D0361 Melanoma in situ of right upper limb, including shoulder: Secondary | ICD-10-CM | POA: Diagnosis not present

## 2020-01-12 DIAGNOSIS — D485 Neoplasm of uncertain behavior of skin: Secondary | ICD-10-CM | POA: Diagnosis not present

## 2020-01-12 DIAGNOSIS — D225 Melanocytic nevi of trunk: Secondary | ICD-10-CM | POA: Diagnosis not present

## 2020-01-12 DIAGNOSIS — Z1283 Encounter for screening for malignant neoplasm of skin: Secondary | ICD-10-CM | POA: Diagnosis not present

## 2020-01-13 DIAGNOSIS — I10 Essential (primary) hypertension: Secondary | ICD-10-CM | POA: Diagnosis not present

## 2020-01-13 DIAGNOSIS — Z79899 Other long term (current) drug therapy: Secondary | ICD-10-CM | POA: Diagnosis not present

## 2020-01-13 DIAGNOSIS — N529 Male erectile dysfunction, unspecified: Secondary | ICD-10-CM | POA: Diagnosis not present

## 2020-01-13 DIAGNOSIS — E538 Deficiency of other specified B group vitamins: Secondary | ICD-10-CM | POA: Diagnosis not present

## 2020-01-13 DIAGNOSIS — R601 Generalized edema: Secondary | ICD-10-CM | POA: Diagnosis not present

## 2020-01-13 DIAGNOSIS — I2510 Atherosclerotic heart disease of native coronary artery without angina pectoris: Secondary | ICD-10-CM | POA: Diagnosis not present

## 2020-01-19 DIAGNOSIS — L988 Other specified disorders of the skin and subcutaneous tissue: Secondary | ICD-10-CM | POA: Diagnosis not present

## 2020-01-19 DIAGNOSIS — D225 Melanocytic nevi of trunk: Secondary | ICD-10-CM | POA: Diagnosis not present

## 2020-01-19 DIAGNOSIS — D485 Neoplasm of uncertain behavior of skin: Secondary | ICD-10-CM | POA: Diagnosis not present

## 2020-01-19 DIAGNOSIS — D0361 Melanoma in situ of right upper limb, including shoulder: Secondary | ICD-10-CM | POA: Diagnosis not present

## 2020-01-27 DIAGNOSIS — N529 Male erectile dysfunction, unspecified: Secondary | ICD-10-CM | POA: Diagnosis not present

## 2020-02-03 DIAGNOSIS — R609 Edema, unspecified: Secondary | ICD-10-CM | POA: Diagnosis not present

## 2020-02-03 DIAGNOSIS — R05 Cough: Secondary | ICD-10-CM | POA: Diagnosis not present

## 2020-02-10 DIAGNOSIS — E291 Testicular hypofunction: Secondary | ICD-10-CM | POA: Diagnosis not present

## 2020-02-19 DIAGNOSIS — D225 Melanocytic nevi of trunk: Secondary | ICD-10-CM | POA: Diagnosis not present

## 2020-02-19 DIAGNOSIS — D2271 Melanocytic nevi of right lower limb, including hip: Secondary | ICD-10-CM | POA: Diagnosis not present

## 2020-02-19 DIAGNOSIS — D485 Neoplasm of uncertain behavior of skin: Secondary | ICD-10-CM | POA: Diagnosis not present

## 2020-02-24 DIAGNOSIS — E291 Testicular hypofunction: Secondary | ICD-10-CM | POA: Diagnosis not present

## 2020-02-26 DIAGNOSIS — L988 Other specified disorders of the skin and subcutaneous tissue: Secondary | ICD-10-CM | POA: Diagnosis not present

## 2020-02-26 DIAGNOSIS — D485 Neoplasm of uncertain behavior of skin: Secondary | ICD-10-CM | POA: Diagnosis not present

## 2020-03-09 DIAGNOSIS — E538 Deficiency of other specified B group vitamins: Secondary | ICD-10-CM | POA: Diagnosis not present

## 2020-03-09 DIAGNOSIS — E291 Testicular hypofunction: Secondary | ICD-10-CM | POA: Diagnosis not present

## 2020-03-23 DIAGNOSIS — E291 Testicular hypofunction: Secondary | ICD-10-CM | POA: Diagnosis not present

## 2020-04-02 ENCOUNTER — Telehealth: Payer: Self-pay | Admitting: Urology

## 2020-04-02 NOTE — Telephone Encounter (Signed)
lmtrc

## 2020-04-02 NOTE — Telephone Encounter (Signed)
Pt requests nurse call him back regarding a medication issue.

## 2020-04-06 ENCOUNTER — Encounter (HOSPITAL_COMMUNITY): Payer: Self-pay | Admitting: Emergency Medicine

## 2020-04-06 ENCOUNTER — Emergency Department (HOSPITAL_COMMUNITY): Payer: Medicare Other

## 2020-04-06 ENCOUNTER — Inpatient Hospital Stay (HOSPITAL_COMMUNITY)
Admission: EM | Admit: 2020-04-06 | Discharge: 2020-04-08 | DRG: 418 | Disposition: A | Discharge status: Home / Self Care | Payer: Medicare Other | Attending: Family Medicine | Admitting: Family Medicine

## 2020-04-06 ENCOUNTER — Other Ambulatory Visit: Payer: Self-pay

## 2020-04-06 DIAGNOSIS — I10 Essential (primary) hypertension: Secondary | ICD-10-CM | POA: Diagnosis not present

## 2020-04-06 DIAGNOSIS — I361 Nonrheumatic tricuspid (valve) insufficiency: Secondary | ICD-10-CM | POA: Diagnosis not present

## 2020-04-06 DIAGNOSIS — I452 Bifascicular block: Secondary | ICD-10-CM | POA: Diagnosis present

## 2020-04-06 DIAGNOSIS — G4730 Sleep apnea, unspecified: Secondary | ICD-10-CM | POA: Diagnosis not present

## 2020-04-06 DIAGNOSIS — I4892 Unspecified atrial flutter: Secondary | ICD-10-CM | POA: Diagnosis present

## 2020-04-06 DIAGNOSIS — Z01810 Encounter for preprocedural cardiovascular examination: Secondary | ICD-10-CM | POA: Diagnosis not present

## 2020-04-06 DIAGNOSIS — N401 Enlarged prostate with lower urinary tract symptoms: Secondary | ICD-10-CM | POA: Diagnosis present

## 2020-04-06 DIAGNOSIS — K8000 Calculus of gallbladder with acute cholecystitis without obstruction: Secondary | ICD-10-CM | POA: Diagnosis not present

## 2020-04-06 DIAGNOSIS — Z7982 Long term (current) use of aspirin: Secondary | ICD-10-CM | POA: Diagnosis not present

## 2020-04-06 DIAGNOSIS — I4891 Unspecified atrial fibrillation: Secondary | ICD-10-CM | POA: Diagnosis not present

## 2020-04-06 DIAGNOSIS — Z823 Family history of stroke: Secondary | ICD-10-CM

## 2020-04-06 DIAGNOSIS — I252 Old myocardial infarction: Secondary | ICD-10-CM

## 2020-04-06 DIAGNOSIS — I480 Paroxysmal atrial fibrillation: Secondary | ICD-10-CM | POA: Diagnosis present

## 2020-04-06 DIAGNOSIS — E669 Obesity, unspecified: Secondary | ICD-10-CM | POA: Diagnosis present

## 2020-04-06 DIAGNOSIS — Z800 Family history of malignant neoplasm of digestive organs: Secondary | ICD-10-CM | POA: Diagnosis not present

## 2020-04-06 DIAGNOSIS — I5032 Chronic diastolic (congestive) heart failure: Secondary | ICD-10-CM | POA: Diagnosis present

## 2020-04-06 DIAGNOSIS — J61 Pneumoconiosis due to asbestos and other mineral fibers: Secondary | ICD-10-CM | POA: Diagnosis present

## 2020-04-06 DIAGNOSIS — Z951 Presence of aortocoronary bypass graft: Secondary | ICD-10-CM | POA: Diagnosis not present

## 2020-04-06 DIAGNOSIS — I110 Hypertensive heart disease with heart failure: Secondary | ICD-10-CM | POA: Diagnosis present

## 2020-04-06 DIAGNOSIS — K8062 Calculus of gallbladder and bile duct with acute cholecystitis without obstruction: Secondary | ICD-10-CM | POA: Diagnosis not present

## 2020-04-06 DIAGNOSIS — G4733 Obstructive sleep apnea (adult) (pediatric): Secondary | ICD-10-CM | POA: Diagnosis present

## 2020-04-06 DIAGNOSIS — J939 Pneumothorax, unspecified: Secondary | ICD-10-CM | POA: Diagnosis not present

## 2020-04-06 DIAGNOSIS — K219 Gastro-esophageal reflux disease without esophagitis: Secondary | ICD-10-CM | POA: Diagnosis present

## 2020-04-06 DIAGNOSIS — Z85820 Personal history of malignant melanoma of skin: Secondary | ICD-10-CM

## 2020-04-06 DIAGNOSIS — K8020 Calculus of gallbladder without cholecystitis without obstruction: Secondary | ICD-10-CM

## 2020-04-06 DIAGNOSIS — E785 Hyperlipidemia, unspecified: Secondary | ICD-10-CM | POA: Diagnosis present

## 2020-04-06 DIAGNOSIS — I2510 Atherosclerotic heart disease of native coronary artery without angina pectoris: Secondary | ICD-10-CM | POA: Diagnosis not present

## 2020-04-06 DIAGNOSIS — K810 Acute cholecystitis: Secondary | ICD-10-CM | POA: Diagnosis not present

## 2020-04-06 DIAGNOSIS — I517 Cardiomegaly: Secondary | ICD-10-CM | POA: Diagnosis not present

## 2020-04-06 DIAGNOSIS — Z20822 Contact with and (suspected) exposure to covid-19: Secondary | ICD-10-CM | POA: Diagnosis present

## 2020-04-06 DIAGNOSIS — Z9119 Patient's noncompliance with other medical treatment and regimen: Secondary | ICD-10-CM | POA: Diagnosis not present

## 2020-04-06 DIAGNOSIS — E291 Testicular hypofunction: Secondary | ICD-10-CM | POA: Diagnosis present

## 2020-04-06 DIAGNOSIS — I509 Heart failure, unspecified: Secondary | ICD-10-CM | POA: Diagnosis not present

## 2020-04-06 DIAGNOSIS — Z79899 Other long term (current) drug therapy: Secondary | ICD-10-CM

## 2020-04-06 DIAGNOSIS — I25119 Atherosclerotic heart disease of native coronary artery with unspecified angina pectoris: Secondary | ICD-10-CM | POA: Diagnosis not present

## 2020-04-06 DIAGNOSIS — Z8249 Family history of ischemic heart disease and other diseases of the circulatory system: Secondary | ICD-10-CM

## 2020-04-06 DIAGNOSIS — Z6837 Body mass index (BMI) 37.0-37.9, adult: Secondary | ICD-10-CM

## 2020-04-06 LAB — TROPONIN I (HIGH SENSITIVITY)
Troponin I (High Sensitivity): 4 ng/L (ref <18)
Troponin I (High Sensitivity): 4 ng/L (ref <18)

## 2020-04-06 LAB — CBC WITH DIFFERENTIAL/PLATELET
Abs Immature Granulocytes: 0.01 K/uL (ref 0.00–0.07)
Basophils Absolute: 0 K/uL (ref 0.0–0.1)
Basophils Relative: 0 %
Eosinophils Absolute: 0.3 K/uL (ref 0.0–0.5)
Eosinophils Relative: 7 %
HCT: 47 % (ref 39.0–52.0)
Hemoglobin: 15.3 g/dL (ref 13.0–17.0)
Immature Granulocytes: 0 %
Lymphocytes Relative: 26 %
Lymphs Abs: 1.2 K/uL (ref 0.7–4.0)
MCH: 31.9 pg (ref 26.0–34.0)
MCHC: 32.6 g/dL (ref 30.0–36.0)
MCV: 97.9 fL (ref 80.0–100.0)
Monocytes Absolute: 0.3 K/uL (ref 0.1–1.0)
Monocytes Relative: 6 %
Neutro Abs: 2.8 K/uL (ref 1.7–7.7)
Neutrophils Relative %: 61 %
Platelets: 152 K/uL (ref 150–400)
RBC: 4.8 MIL/uL (ref 4.22–5.81)
RDW: 14.6 % (ref 11.5–15.5)
WBC: 4.6 K/uL (ref 4.0–10.5)
nRBC: 0 % (ref 0.0–0.2)

## 2020-04-06 LAB — COMPREHENSIVE METABOLIC PANEL
ALT: 24 U/L (ref 0–44)
AST: 20 U/L (ref 15–41)
Albumin: 4 g/dL (ref 3.5–5.0)
Alkaline Phosphatase: 46 U/L (ref 38–126)
Anion gap: 8 (ref 5–15)
BUN: 20 mg/dL (ref 8–23)
CO2: 27 mmol/L (ref 22–32)
Calcium: 8.8 mg/dL — ABNORMAL LOW (ref 8.9–10.3)
Chloride: 102 mmol/L (ref 98–111)
Creatinine, Ser: 0.99 mg/dL (ref 0.61–1.24)
GFR calc Af Amer: >60 mL/min (ref >60)
GFR calc non Af Amer: >60 mL/min (ref >60)
Glucose, Bld: 128 mg/dL — ABNORMAL HIGH (ref 70–99)
Potassium: 3.8 mmol/L (ref 3.5–5.1)
Sodium: 137 mmol/L (ref 135–145)
Total Bilirubin: 0.5 mg/dL (ref 0.3–1.2)
Total Protein: 7 g/dL (ref 6.5–8.1)

## 2020-04-06 LAB — LIPASE, BLOOD: Lipase: 37 U/L (ref 11–51)

## 2020-04-06 MED ORDER — ONDANSETRON HCL 4 MG/2ML IJ SOLN
4.0000 mg | Freq: Once | INTRAMUSCULAR | Status: AC
  Administered 2020-04-06: 4 mg via INTRAVENOUS
  Filled 2020-04-06: qty 2

## 2020-04-06 MED ORDER — ACETAMINOPHEN 650 MG RE SUPP: 650.0000 mg | Freq: Four times a day (QID) | RECTAL | Status: DC | PRN

## 2020-04-06 MED ORDER — ALBUTEROL SULFATE (2.5 MG/3ML) 0.083% IN NEBU: 3.0000 mL | INHALATION_SOLUTION | RESPIRATORY_TRACT | Status: DC | PRN

## 2020-04-06 MED ORDER — CHLORHEXIDINE GLUCONATE CLOTH 2 % EX PADS
6.0000 | MEDICATED_PAD | Freq: Once | CUTANEOUS | Status: AC
  Administered 2020-04-06: 6 via TOPICAL

## 2020-04-06 MED ORDER — METOPROLOL TARTRATE 25 MG PO TABS
25.0000 mg | ORAL_TABLET | Freq: Two times a day (BID) | ORAL | Status: DC
  Administered 2020-04-06 – 2020-04-07 (×3): 25 mg via ORAL
  Filled 2020-04-06 (×4): qty 1

## 2020-04-06 MED ORDER — CHLORHEXIDINE GLUCONATE CLOTH 2 % EX PADS
6.0000 | MEDICATED_PAD | Freq: Once | CUTANEOUS | Status: AC
  Administered 2020-04-07: 6 via TOPICAL

## 2020-04-06 MED ORDER — SODIUM CHLORIDE 0.9% FLUSH
3.0000 mL | Freq: Two times a day (BID) | INTRAVENOUS | Status: DC
  Administered 2020-04-06: 10 mL via INTRAVENOUS
  Administered 2020-04-08 (×2): 3 mL via INTRAVENOUS

## 2020-04-06 MED ORDER — MORPHINE SULFATE (PF) 4 MG/ML IV SOLN
4.0000 mg | Freq: Once | INTRAVENOUS | Status: AC
  Administered 2020-04-06: 4 mg via INTRAVENOUS
  Filled 2020-04-06: qty 1

## 2020-04-06 MED ORDER — VITAMIN B-12 1000 MCG PO TABS
1000.0000 ug | ORAL_TABLET | Freq: Every day | ORAL | Status: DC
  Administered 2020-04-07 – 2020-04-08 (×2): 1000 ug via ORAL
  Filled 2020-04-06 (×2): qty 1

## 2020-04-06 MED ORDER — ALBUTEROL SULFATE (2.5 MG/3ML) 0.083% IN NEBU: 2.5000 mg | INHALATION_SOLUTION | RESPIRATORY_TRACT | Status: DC | PRN

## 2020-04-06 MED ORDER — OXYCODONE HCL 5 MG PO TABS: 5.0000 mg | ORAL_TABLET | ORAL | Status: DC | PRN

## 2020-04-06 MED ORDER — TRAZODONE HCL 50 MG PO TABS: 50.0000 mg | ORAL_TABLET | Freq: Every evening | ORAL | Status: DC | PRN

## 2020-04-06 MED ORDER — ONDANSETRON HCL 4 MG PO TABS: 4.0000 mg | ORAL_TABLET | Freq: Four times a day (QID) | ORAL | Status: DC | PRN

## 2020-04-06 MED ORDER — POLYETHYLENE GLYCOL 3350 17 G PO PACK
17.0000 g | PACK | Freq: Every day | ORAL | Status: DC | PRN
  Filled 2020-04-06: qty 1

## 2020-04-06 MED ORDER — ONDANSETRON HCL 4 MG/2ML IJ SOLN
4.0000 mg | Freq: Four times a day (QID) | INTRAMUSCULAR | Status: DC | PRN
  Administered 2020-04-07: 4 mg via INTRAVENOUS
  Filled 2020-04-06: qty 2

## 2020-04-06 MED ORDER — SODIUM CHLORIDE 0.9 % IV SOLN
2.0000 g | INTRAVENOUS | Status: AC
  Administered 2020-04-07: 2 g via INTRAVENOUS
  Filled 2020-04-06 (×2): qty 2

## 2020-04-06 MED ORDER — SODIUM CHLORIDE 0.9 % IV SOLN: 250.0000 mL | INTRAVENOUS | Status: DC | PRN

## 2020-04-06 MED ORDER — FENTANYL CITRATE (PF) 100 MCG/2ML IJ SOLN: 12.5000 ug | INTRAMUSCULAR | Status: DC | PRN

## 2020-04-06 MED ORDER — ACETAMINOPHEN 325 MG PO TABS: 650.0000 mg | ORAL_TABLET | Freq: Four times a day (QID) | ORAL | Status: DC | PRN

## 2020-04-06 MED ORDER — DOXAZOSIN MESYLATE 2 MG PO TABS
4.0000 mg | ORAL_TABLET | Freq: Two times a day (BID) | ORAL | Status: DC
  Administered 2020-04-06 – 2020-04-08 (×4): 4 mg via ORAL
  Filled 2020-04-06 (×4): qty 2

## 2020-04-06 MED ORDER — IOHEXOL 350 MG/ML SOLN
100.0000 mL | Freq: Once | INTRAVENOUS | Status: AC | PRN
  Administered 2020-04-06: 100 mL via INTRAVENOUS

## 2020-04-06 MED ORDER — PIPERACILLIN-TAZOBACTAM 3.375 G IVPB
3.3750 g | Freq: Three times a day (TID) | INTRAVENOUS | Status: DC
  Administered 2020-04-06 – 2020-04-07 (×2): 3.375 g via INTRAVENOUS
  Filled 2020-04-06 (×2): qty 50

## 2020-04-06 MED ORDER — PIPERACILLIN-TAZOBACTAM 3.375 G IVPB 30 MIN
3.3750 g | Freq: Once | INTRAVENOUS | Status: AC
  Administered 2020-04-06: 3.375 g via INTRAVENOUS
  Filled 2020-04-06: qty 50

## 2020-04-06 MED ORDER — SODIUM CHLORIDE 0.9% FLUSH: 3.0000 mL | INTRAVENOUS | Status: DC | PRN

## 2020-04-06 MED ORDER — SODIUM CHLORIDE 0.9 % IV SOLN: INTRAVENOUS | Status: DC

## 2020-04-06 MED ORDER — PANTOPRAZOLE SODIUM 40 MG IV SOLR
40.0000 mg | Freq: Once | INTRAVENOUS | Status: AC
  Administered 2020-04-06: 40 mg via INTRAVENOUS
  Filled 2020-04-06: qty 40

## 2020-04-06 MED ORDER — HEPARIN SODIUM (PORCINE) 5000 UNIT/ML IJ SOLN: 5000.0000 [IU] | Freq: Three times a day (TID) | INTRAMUSCULAR | Status: DC

## 2020-04-06 MED ORDER — ATORVASTATIN CALCIUM 10 MG PO TABS
10.0000 mg | ORAL_TABLET | Freq: Every day | ORAL | Status: DC
  Administered 2020-04-07: 10 mg via ORAL
  Filled 2020-04-06 (×2): qty 1

## 2020-04-06 MED ORDER — CLONAZEPAM 0.5 MG PO TABS
0.5000 mg | ORAL_TABLET | Freq: Every day | ORAL | Status: DC
  Administered 2020-04-06 – 2020-04-07 (×2): 0.5 mg via ORAL
  Filled 2020-04-06 (×2): qty 1

## 2020-04-06 MED ORDER — ASPIRIN 81 MG PO CHEW
81.0000 mg | CHEWABLE_TABLET | Freq: Every day | ORAL | Status: DC
  Administered 2020-04-08: 81 mg via ORAL
  Filled 2020-04-06 (×2): qty 1

## 2020-04-06 NOTE — ED Triage Notes (Signed)
Pt c/o epigastric pain, back pain since 3am this morning.

## 2020-04-06 NOTE — ED Notes (Signed)
Ultrasound at bedside at this time.

## 2020-04-06 NOTE — ED Provider Notes (Signed)
[facility] EMERGENCY DEPARTMENT Provider Note   CSN: [id number] Arrival date & time: 04/06/20  0457     History Chief Complaint  Patient presents with  . Chest Pain    Devin [name] is a 74 y.o. male.  HPI     This is a 74 year old male with a history of coronary artery disease, hypertension who presents with abdominal pain and back pain.  Onset of symptoms around 3 AM this morning.  Reports epigastric abdominal pain that is sharp and radiates into his back.  Patient has had 1 similar episode of pain after eating a green pepper.  He states that he ate peppers last night as well.  He has a known history of gallbladder disease.  Does have a history of MI but states his pain is different from his prior MIs.  No exertional component.  He denies any fever but does report nausea.  No vomiting or diarrhea.  He rates his pain at 8 out of 10.  He did not take anything at home for his pain.  Past Medical History:  Diagnosis Date  . Arthritis   . Asbestosis ([place])   . 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  . Essential hypertension   . GERD (gastroesophageal reflux disease)   . Melanoma of back ([place])   . Prostatic hypertrophy   . Sleep apnea    Intolerant of CPAP    Patient Active Problem List   Diagnosis Date Noted  . 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 Nocturia 10/17/2019  . Erectile dysfunction due to arterial insufficiency 10/17/2019  . Atrial fibrillation and flutter ([place]) 01/23/2015  . Palpitations 01/23/2015  . Hyperlipidemia 01/23/2015  . BPH with urinary obstruction 01/23/2015  . Chronic diastolic CHF (congestive heart failure) ([place]) 01/23/2015  . Atrial fibrillation with RVR ([place]) 01/23/2015  . Obstructive sleep apnea 01/18/2015  . Essential hypertension 01/18/2015  . Leg edema 01/18/2015  . 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 CAD (coronary artery disease), native coronary artery 12/26/2014     Past Surgical History:  Procedure Laterality Date  . ANTERIOR CERVICAL DECOMP/DISCECTOMY FUSION    . CARDIOVERSION N/A 01/28/2015   Procedure: CARDIOVERSION;  Surgeon: Arnoldo Lenis, MD;  Location: [facility];  Service: Endoscopy;  Laterality: N/A;  . CORONARY ARTERY BYPASS GRAFT N/A 12/29/2014   Procedure: CORONARY ARTERY BYPASS GRAFTING (CABG), ON PUMP, TIMES FOUR, USING LEFT INTERNAL MAMMARY ARTERY, RIGHT GREATER SAPHENOUS VEIN HARVESTED ENDOSCOPICALLY;  Surgeon: Ivin Poot, MD;  Location: [facility];  Service: Open Heart Surgery;  Laterality: N/A;  . KNEE ARTHROSCOPY Right   . KNEE CARTILAGE SURGERY Left   . LEFT HEART CATHETERIZATION WITH CORONARY ANGIOGRAM N/A 12/28/2014   Procedure: LEFT HEART CATHETERIZATION WITH CORONARY ANGIOGRAM;  Surgeon: Lorretta Harp, MD;  Location: [facility] CATH LAB;  Service: Cardiovascular;  Laterality: N/A;  . MELANOMA EXCISION     "back"  . SHOULDER OPEN ROTATOR CUFF REPAIR Left   . TEE WITHOUT CARDIOVERSION N/A 12/29/2014   Procedure: TRANSESOPHAGEAL ECHOCARDIOGRAM (TEE);  Surgeon: Ivin Poot, MD;  Location: [place];  Service: Open Heart Surgery;  Laterality: N/A;  . TEE WITHOUT CARDIOVERSION N/A 01/28/2015   Procedure: TRANSESOPHAGEAL ECHOCARDIOGRAM (TEE);  Surgeon: Arnoldo Lenis, MD;  Location: [facility];  Service: Endoscopy;  Laterality: N/A;       Family History  Problem Relation Age of Onset  .  Stroke Mother   . Hypertension Mother   . Heart disease Sister        Died from complications of RHD  . Heart disease Brother        Heart failure related to EtOH and smoking  . Stomach cancer Sister     Social History   Tobacco Use  . Smoking status: Never Smoker  . Smokeless tobacco: Never Used  Substance Use Topics  . Alcohol use: Yes    Alcohol/week: 0.0 standard drinks    Comment: Occasional  . Drug use: No    Home Medications Prior to Admission medications   Medication Sig Start Date End Date Taking? Authorizing Provider  aspirin  81 MG tablet Take 81 mg by mouth daily.    [provider]  atorvastatin (LIPITOR) 10 MG tablet Take 10 mg by mouth daily.    [provider]  clonazePAM (KLONOPIN) 0.5 MG tablet 0.5 mg at bedtime.  02/22/15   [provider]  cyanocobalamin (,VITAMIN B-12,) 1000 MCG/ML injection INJECT 1ML AS DIRECTED EVERY 4 WEEKS 06/10/19   [provider]  cyanocobalamin 1000 MCG tablet Take 1,000 mcg by mouth daily.    [provider]  doxazosin (CARDURA) 4 MG tablet Take 1 tablet (4 mg total) by mouth 2 (two) times daily. 01/09/20   Irine Seal, MD  PROAIR HFA 108 [phone] Base) MCG/ACT inhaler  09/19/19   [provider]  testosterone cypionate (DEPOTESTOSTERONE CYPIONATE) 200 MG/ML injection SMARTSIG:1 Milliliter(s) IM Once a Week 10/03/19   [provider]  testosterone cypionate (DEPOTESTOSTERONE CYPIONATE) 200 MG/ML injection INJECT 2 ML IM EVERY 2 WEEKS 07/28/19   [provider]  Testosterone Cypionate 200 MG/ML KIT Inject 400 mg into the muscle every 14 (fourteen) days.    [provider]  torsemide (DEMADEX) 20 MG tablet Take 20 mg by mouth daily.    [provider]    Allergies    Lasix [furosemide]  Review of Systems   Review of Systems  Constitutional: Negative for fever.  Respiratory: Negative for shortness of breath.   Cardiovascular: Negative for chest pain.  Gastrointestinal: Positive for abdominal pain and nausea. Negative for diarrhea and vomiting.  Genitourinary: Negative for dysuria.  Musculoskeletal: Positive for back pain.  All other systems reviewed and are negative.   Physical Exam Updated Vital Signs BP (!) 160/80   Pulse 74   Temp 98.3 F (36.8 C)   Resp 18   Ht 1.829 m (6')   Wt 127 kg   SpO2 100%   BMI 37.97 kg/m   Physical Exam Vitals and nursing note reviewed.  Constitutional:      Appearance: He is well-developed. He is obese. He is not ill-appearing.  HENT:     Head:  Normocephalic and atraumatic.  Eyes:     Pupils: Pupils are equal, round, and reactive to light.  Cardiovascular:     Rate and Rhythm: Normal rate and regular rhythm.     Heart sounds: Normal heart sounds. No murmur.     Comments: Well-healed midline sternotomy scar Pulmonary:     Effort: Pulmonary effort is normal. No respiratory distress.     Breath sounds: Normal breath sounds. No wheezing.  Abdominal:     General: Bowel sounds are normal.     Palpations: Abdomen is soft.     Tenderness: There is abdominal tenderness. There is no guarding or rebound.     Comments: Epigastric and right upper quadrant tenderness palpation without rebound  or guarding  Musculoskeletal:     Cervical back: Neck supple.     Right lower leg: No edema.     Left lower leg: No edema.     Comments: Trace bilateral lower extremity edema  Lymphadenopathy:     Cervical: No cervical adenopathy.  Skin:    General: Skin is warm and dry.  Neurological:     Mental Status: He is alert and oriented to person, place, and time.  Psychiatric:        Mood and Affect: Mood normal.     ED Results / Procedures / Treatments   Labs (all labs ordered are listed, but only abnormal results are displayed) Labs Reviewed  CBC WITH DIFFERENTIAL/PLATELET  COMPREHENSIVE METABOLIC PANEL  LIPASE, BLOOD  TROPONIN I (HIGH SENSITIVITY)    EKG EKG Interpretation  Date/Time:  Tuesday April 06 2020 05:10:19 EDT Ventricular Rate:  72 PR Interval:    QRS Duration: 162 QT Interval:  433 QTC Calculation: 474 R Axis:   -34 Text Interpretation: Sinus rhythm Right bundle branch block Similar morphology to prior Confirmed by Thayer Jew [phone]) on 04/06/2020 5:40:35 AM   Radiology DG Chest Portable 1 View  Result Date: 04/06/2020 CLINICAL DATA:  Abdominal/epigastric pain EXAM: PORTABLE CHEST 1 VIEW COMPARISON:  04/07/2015 FINDINGS: Cardiomegaly. CABG. There is no edema, consolidation, effusion, or pneumothorax. No acute osseous  finding IMPRESSION: 1. No acute finding. 2. Mild cardiomegaly. Electronically Signed   By: Monte Fantasia M.D.   On: 04/06/2020 05:40    Procedures Procedures (including critical care time)  Medications Ordered in ED Medications  morphine 4 MG/ML injection 4 mg (4 mg Intravenous Given 04/06/20 0537)  ondansetron (ZOFRAN) injection 4 mg (4 mg Intravenous Given 04/06/20 0537)    ED Course  I have reviewed the triage vital signs and the nursing notes.  Pertinent labs & imaging results that were available during my care of the patient were reviewed by me and considered in my medical decision making (see chart for details).    MDM Rules/Calculators/A&P                      Patient presents with abdominal discomfort.  Denies chest pain.  Feels that this is different than his prior MIs.  He is uncomfortable appearing but nontoxic and in no acute distress.  He has some tenderness on exam.  Considerations include but not limited to pancreatitis, gastritis, cholecystitis, aortic pathology, atypical MI.  EKG without acute ischemic changes.  Patient's chest x-ray is reassuring.  Given description of pain and radiation, would want to rule out AAA.  Lab work reviewed.  No significant leukocytosis or metabolic derangements.  LFTs normal.  This is reassuring.  CT scan pending.  I did review it myself and did not see any obvious aortic pathology.  Troponin negative.  Patient signed out pending official CT read.  He has persistent abdominal pain, would consider right upper quadrant ultrasound given history of gallbladder pathology. Final Clinical Impression(s) / ED Diagnoses Final diagnoses:  None    Rx / DC Orders ED Discharge Orders    None       Sinjin Amero, Barbette Hair, MD 04/07/20 [phone]

## 2020-04-06 NOTE — ED Notes (Signed)
Called for clear liquid diet tray for pt

## 2020-04-06 NOTE — H&P (View-Only) (Signed)
[facility] Consult  Reason for Consult: Acute cholecystitis  Referring Physician:  Dr. Denton Brick   Chief Complaint    Chest Pain      HPI: Devin [name] is a 74 y.o. male with sudden onset of epigastric type pain that was sharp and radiating to his back. He had a similar episode eating green peppers. He reports some chronic right sided pain he thought was muscle or from rib fracture.  He says he has known about gallstones for years. He has a history of CAD s/p CABG, he reports he never had a MI. He sees Dr. Domenic Polite for cardiology. He says he has some chronic pain with deep breathing that sounds more pleuritic in nature. Says he has some SOB, and that he had a bad respiratory infection January 2020 (17 months ago) he suspects was COVID and he has had to use an inhaler since that time.  He last saw Dr. Domenic Polite February 2021 and his last ECHO was February 2020. He reports worsening swelling in his legs since seeing Dr. Domenic Polite and inability to take lasix due to an allergy.  He denies any vomiting or bowel changes. He denies any fever or chills. He did have some nausea. His pain is improved now.   Past Medical History:  Diagnosis Date  . Arthritis   . Asbestosis ([place])   . 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  . Essential hypertension   . GERD (gastroesophageal reflux disease)   . Melanoma of back ([place])   . Prostatic hypertrophy   . Sleep apnea    Intolerant of CPAP    Past Surgical History:  Procedure Laterality Date  . ANTERIOR CERVICAL DECOMP/DISCECTOMY FUSION    . CARDIOVERSION N/A 01/28/2015   Procedure: CARDIOVERSION;  Surgeon: Arnoldo Lenis, MD;  Location: [facility];  Service: Endoscopy;  Laterality: N/A;  . CORONARY ARTERY BYPASS GRAFT N/A 12/29/2014   Procedure: CORONARY ARTERY BYPASS GRAFTING (CABG), ON PUMP, TIMES FOUR, USING LEFT INTERNAL MAMMARY ARTERY, RIGHT  GREATER SAPHENOUS VEIN HARVESTED ENDOSCOPICALLY;  Surgeon: Ivin Poot, MD;  Location: [facility];  Service: Open Heart Surgery;  Laterality: N/A;  . KNEE ARTHROSCOPY Right   . KNEE CARTILAGE SURGERY Left   . LEFT HEART CATHETERIZATION WITH CORONARY ANGIOGRAM N/A 12/28/2014   Procedure: LEFT HEART CATHETERIZATION WITH CORONARY ANGIOGRAM;  Surgeon: Lorretta Harp, MD;  Location: [facility];  Service: Cardiovascular;  Laterality: N/A;  . MELANOMA EXCISION     "back"  . SHOULDER OPEN ROTATOR CUFF REPAIR Left   . TEE WITHOUT CARDIOVERSION N/A 12/29/2014   Procedure: TRANSESOPHAGEAL ECHOCARDIOGRAM (TEE);  Surgeon: Ivin Poot, MD;  Location: [place];  Service: Open Heart Surgery;  Laterality: N/A;  . TEE WITHOUT CARDIOVERSION N/A 01/28/2015   Procedure: TRANSESOPHAGEAL ECHOCARDIOGRAM (TEE);  Surgeon: Arnoldo Lenis, MD;  Location: [facility];  Service: Endoscopy;  Laterality: N/A;    Family History  Problem Relation Age of Onset  . Stroke Mother   . Hypertension Mother   . Heart disease Sister        Died from complications of RHD  . Heart disease Brother        Heart failure related to EtOH and smoking  . Stomach cancer Sister     Social History   Tobacco Use  . Smoking status: Never Smoker  . Smokeless tobacco: Never Used  Substance Use Topics  . Alcohol use: Yes    Alcohol/week: 0.0 standard drinks    Comment: Occasional  . Drug use: No    Medications: I have reviewed the patient's current medications. Current Facility-Administered Medications  Medication Dose Route Frequency Provider Last Rate Last Admin  . 0.9 %  sodium chloride infusion  250 mL Intravenous PRN Emokpae, Courage, MD      . 0.9 %  sodium chloride infusion   Intravenous Continuous Emokpae, Courage, MD      . acetaminophen (TYLENOL) tablet 650 mg  650 mg Oral Q6H PRN Emokpae, Courage, MD       Or  . acetaminophen (TYLENOL) suppository 650 mg  650 mg Rectal Q6H PRN Emokpae, Courage, MD      . albuterol  (PROVENTIL) (2.5 MG/3ML) 0.083% nebulizer solution 2.5 mg  2.5 mg Nebulization Q2H PRN Emokpae, Courage, MD      . albuterol (PROVENTIL) (2.5 MG/3ML) 0.083% nebulizer solution 3 mL  3 mL Nebulization Q4H PRN Emokpae, Courage, MD      . aspirin chewable tablet 81 mg  81 mg Oral Q breakfast Emokpae, Courage, MD      . atorvastatin (LIPITOR) tablet 10 mg  10 mg Oral Daily Emokpae, Courage, MD      . clonazePAM (KLONOPIN) tablet 0.5 mg  0.5 mg Oral QHS Emokpae, Courage, MD      . doxazosin (CARDURA) tablet 4 mg  4 mg Oral BID Emokpae, Courage, MD      . fentaNYL (SUBLIMAZE) injection 12.5-50 mcg  12.5-50 mcg Intravenous Q2H PRN Denton Brick, Courage, MD      . Derrill Memo ON 04/08/2020] heparin injection 5,000 Units  5,000 Units Subcutaneous Q8H Emokpae, Courage, MD      . metoprolol tartrate (LOPRESSOR) tablet 25 mg  25 mg Oral BID Emokpae, Courage, MD      . ondansetron (ZOFRAN) tablet 4 mg  4 mg Oral Q6H PRN Emokpae, Courage, MD       Or  . ondansetron (ZOFRAN) injection 4 mg  4 mg Intravenous Q6H PRN Emokpae, Courage, MD      . oxyCODONE (Oxy IR/ROXICODONE) immediate release tablet 5 mg  5 mg Oral Q4H PRN Emokpae, Courage, MD      . polyethylene glycol (MIRALAX / GLYCOLAX) packet 17 g  17 g Oral Daily PRN Emokpae, Courage, MD      . sodium chloride flush (NS) 0.9 % injection 3 mL  3 mL Intravenous Q12H Emokpae, Courage, MD      . sodium chloride flush (NS) 0.9 % injection 3 mL  3 mL Intravenous PRN Emokpae, Courage, MD      . traZODone (DESYREL) tablet 50 mg  50 mg Oral QHS PRN Emokpae, Courage, MD      . vitamin B-12 (CYANOCOBALAMIN) tablet 1,000 mcg  1,000 mcg Oral Daily Emokpae, Courage, MD        Allergies  Allergen Reactions  . Lasix [Furosemide] Nausea Only     ROS:  A comprehensive review of systems was negative except for: Respiratory: positive for pleurisy/chest pain and chronic SOB, wheezing/ uses inhaler Cardiovascular: positive for bilateral lower edema, worsening Gastrointestinal:  positive for abdominal pain and nausea  Blood pressure 127/77, pulse 71, temperature 98.3 F (36.8 C), resp. rate 20, height 6' (1.829 m), weight 127 kg, SpO2 94 %. Physical Exam Vitals reviewed.  HENT:     Head: Normocephalic.  Cardiovascular:     Rate and Rhythm: Normal rate.  Pulmonary:  Effort: Pulmonary effort is normal. No tachypnea or respiratory distress.  Abdominal:     Palpations: Abdomen is soft.     Tenderness: There is abdominal tenderness.  Musculoskeletal:     Cervical back: Normal range of motion.     Right lower leg: Edema present.     Left lower leg: Edema present.  Neurological:     General: No focal deficit present.     Mental Status: He is alert and oriented to person, place, and time.  Psychiatric:        Mood and Affect: Mood normal.        Behavior: Behavior normal.     Results: Results for orders placed or performed during the hospital encounter of 04/06/20 (from the past 48 hour(s))  CBC with Differential     Status: None   Collection Time: 04/06/20  5:20 AM  Result Value Ref Range   WBC 4.6 4.0 - 10.5 K/uL   RBC 4.80 4.22 - 5.81 MIL/uL   Hemoglobin 15.3 13.0 - 17.0 g/dL   HCT 47.0 39.0 - 52.0 %   MCV 97.9 80.0 - 100.0 fL   MCH 31.9 26.0 - 34.0 pg   MCHC 32.6 30.0 - 36.0 g/dL   RDW 14.6 11.5 - 15.5 %   Platelets 152 150 - 400 K/uL   nRBC 0.0 0.0 - 0.2 %   Neutrophils Relative % 61 %   Neutro Abs 2.8 1.7 - 7.7 K/uL   Lymphocytes Relative 26 %   Lymphs Abs 1.2 0.7 - 4.0 K/uL   Monocytes Relative 6 %   Monocytes Absolute 0.3 0.1 - 1.0 K/uL   Eosinophils Relative 7 %   Eosinophils Absolute 0.3 0.0 - 0.5 K/uL   Basophils Relative 0 %   Basophils Absolute 0.0 0.0 - 0.1 K/uL   Immature Granulocytes 0 %   Abs Immature Granulocytes 0.01 0.00 - 0.07 K/uL    Comment: Performed at [facility], [address] [place], [place] [address]  Comprehensive metabolic panel     Status: Abnormal   Collection Time: 04/06/20  5:20 AM  Result Value Ref  Range   Sodium 137 135 - 145 mmol/L   Potassium 3.8 3.5 - 5.1 mmol/L   Chloride 102 98 - 111 mmol/L   CO2 27 22 - 32 mmol/L   Glucose, Bld 128 (H) 70 - 99 mg/dL    Comment: Glucose reference range applies only to samples taken after fasting for at least 8 hours.   BUN 20 8 - 23 mg/dL   Creatinine, Ser 0.99 0.61 - 1.24 mg/dL   Calcium 8.8 (L) 8.9 - 10.3 mg/dL   Total Protein 7.0 6.5 - 8.1 g/dL   Albumin 4.0 3.5 - 5.0 g/dL   AST 20 15 - 41 U/L   ALT 24 0 - 44 U/L   Alkaline Phosphatase 46 38 - 126 U/L   Total Bilirubin 0.5 0.3 - 1.2 mg/dL   GFR calc non Af Amer >60 >60 mL/min   GFR calc Af Amer >60 >60 mL/min   Anion gap 8 5 - 15    Comment: Performed at [facility], [address] [place], [place] [address]  Lipase, blood     Status: None   Collection Time: 04/06/20  5:20 AM  Result Value Ref Range   Lipase 37 11 - 51 U/L    Comment: Performed at [facility], [address], [place], [place] [address]  Troponin I (High Sensitivity)  Status: None   Collection Time: 04/06/20  5:20 AM  Result Value Ref Range   Troponin I (High Sensitivity) 4 <18 ng/L    Comment: (NOTE) Elevated high sensitivity troponin I (hsTnI) values and significant  changes across serial measurements may suggest ACS but many other  chronic and acute conditions are known to elevate hsTnI results.  Refer to the "Links" section for chest pain algorithms and additional  guidance. Performed at [facility], [address] [place], [place] [address]   Troponin I (High Sensitivity)     Status: None   Collection Time: 04/06/20  7:30 AM  Result Value Ref Range   Troponin I (High Sensitivity) 4 <18 ng/L    Comment: (NOTE) Elevated high sensitivity troponin I (hsTnI) values and significant  changes across serial measurements may suggest ACS but many other  chronic and acute conditions are known to elevate hsTnI results.  Refer to the "Links" section for chest pain algorithms and additional  guidance. Performed  at [facility], [address] [place], [place] [address]    Personally reviewed- gallbladder with stones, mildly thick at 4.5mm, CBD 27mm  DG Chest Portable 1 View  Result Date: 04/06/2020 CLINICAL DATA:  Abdominal/epigastric pain EXAM: PORTABLE CHEST 1 VIEW COMPARISON:  04/07/2015 FINDINGS: Cardiomegaly. CABG. There is no edema, consolidation, effusion, or pneumothorax. No acute osseous finding IMPRESSION: 1. No acute finding. 2. Mild cardiomegaly. Electronically Signed   By: Monte Fantasia M.D.   On: 04/06/2020 05:40   CT Angio Abd/Pel W and/or Wo Contrast  Result Date: 04/06/2020 CLINICAL DATA:  Epigastric and back pain since 3 a.m. EXAM: CTA ABDOMEN AND PELVIS WITHOUT AND WITH CONTRAST TECHNIQUE: Multidetector CT imaging of the abdomen and pelvis was performed using the standard protocol during bolus administration of intravenous contrast. Multiplanar reconstructed images and MIPs were obtained and reviewed to evaluate the vascular anatomy. CONTRAST:  168mL OMNIPAQUE IOHEXOL 350 MG/ML SOLN COMPARISON:  None. FINDINGS: VASCULAR Aorta: Diffuse atheromatous wall thickening with mixed density. No aneurysm or dissection. Celiac: Mild atherosclerosis.  No acute finding or stenosis. SMA: Mild atherosclerosis.  No acute finding or proximal stenosis. Renals: 3 left renal arteries. No flow limiting stenosis, branch occlusion, or beading. IMA: Patent Inflow: Atherosclerosis without stenosis or dissection. Proximal Outflow: Atherosclerosis. Veins: Negative in the arterial phase Review of the MIP images confirms the above findings. NON-VASCULAR Lower chest:  Extensive coronary atherosclerosis. Hepatobiliary: No focal liver abnormality.Cholelithiasis without evidence of cholecystitis. Pancreas: Unremarkable. Spleen: Unremarkable. Adrenals/Urinary Tract: Negative adrenals. No hydronephrosis or stone. Unremarkable bladder. Stomach/Bowel:  No obstruction. No appendicitis. Lymphatic: No mass or adenopathy.  Reproductive:Enlarged prostate up lifting the bladder base. Other: No ascites or pneumoperitoneum. Musculoskeletal: No acute abnormalities. Advanced lumbar spine degeneration. IMPRESSION: VASCULAR No emergent finding. Aortic Atherosclerosis without flow limiting stenosis of major branch vessels (ICD10-I70.0). Coronary atherosclerosis. NON-VASCULAR 1. No acute finding. 2. Cholelithiasis. Electronically Signed   By: Monte Fantasia M.D.   On: 04/06/2020 07:33   US Abdomen Limited RUQ  Result Date: 04/06/2020 CLINICAL DATA:  Right upper quadrant pain for 10 hours EXAM: ULTRASOUND ABDOMEN LIMITED RIGHT UPPER QUADRANT COMPARISON:  CTA [facility] 04/06/2020 FINDINGS: Gallbladder: The gallbladder appears distended measuring 11 cm in length. There is gallbladder wall thickening measuring 5 mm. Multiple stones are identified which measure up to 8 mm. Positive sonographic Murphy's sign. Negative for pericholecystic fluid. Common bile duct: Diameter: 6.1 mm Liver: No focal lesion identified. Within normal limits in parenchymal echogenicity. Portal vein is patent on color Doppler imaging  with normal direction of blood flow towards the liver. Other: None. IMPRESSION: 1. Gallstones, gallbladder wall thickening and distention. A positive sonographic Murphy sign was elicited by the ultrasound technologist. Imaging findings are compatible with cholecystitis. If further imaging is clinically indicated consider nuclear medicine hepatic biliary scan to assess patency of the cystic duct. Electronically Signed   By: Kerby Moors M.D.   On: 04/06/2020 10:18     Assessment & Plan:  REYES ALDACO is a 74 y.o. male with potential acute cholecystitis versus chronic cholecystitis. Patient had an event that he wanted to leave AMA for, but has decided to stay. Discussed that he could have acute cholecystitis or this could be chronic inflammation. Discussed options of surgery, HIDA scan, or treatment with antibiotics and outpatient surgery.  At  this time he has rescheduled his event. He wants to proceed with surgery. With the worsening leg edema I think he needs a repeat ECHO, and will get cardiology to weigh in tomorrow to ensure no further workup needed.  His last ECHO was 16 months ago.      -NPO midnight  -ECHO in AM and cardiology risk stratify given history, worsening edema, some pleuritic pain/ but no real chest pressure, ED work up negative  -Antibiotics now for potential acute cholecystitis   PLAN: I counseled the patient about the indication, risks and benefits of laparoscopic cholecystectomy.  He understands there is a very small chance for bleeding, infection, injury to normal structures (including common bile duct), conversion to open surgery, persistent symptoms, evolution of postcholecystectomy diarrhea, need for secondary interventions, anesthesia reaction, cardiopulmonary issues and other risks not specifically detailed here. I described the expected recovery, the plan for follow-up and the restrictions during the recovery phase.  All questions were answered.  All questions were answered to the satisfaction of the patient and family.   Virl Cagey 04/06/2020, 10:53 AM

## 2020-04-06 NOTE — Progress Notes (Signed)
Pharmacy Antibiotic Note  Devin [name] is a 74 y.o. male admitted on 04/06/2020 with intraabdominal infection/cholecysitis.  Pharmacy has been consulted for zosyn dosing.  Plan: Continue zosyn 3.375gm IV q8h (4 hour infusion) Monitor for deescalation, clinical course  Height: 6' (182.9 cm) Weight: 127 kg (280 lb) IBW/kg (Calculated) : 77.6  Temp (24hrs), Avg:98.3 F (36.8 C), Min:98.2 F (36.8 C), Max:98.3 F (36.8 C)  Recent Labs  Lab 04/06/20 0520  WBC 4.6  CREATININE 0.99    Estimated Creatinine Clearance: 90.2 mL/min (by C-G formula based on SCr of 0.99 mg/dL).    Allergies  Allergen Reactions  . Lasix [Furosemide] Nausea Only    Antimicrobials this admission: 6/8 zosyn >>    Devin [name] A. Levada Dy, PharmD, BCPS, FNKF Clinical Pharmacist [place] Please utilize Amion for appropriate phone number to reach the unit pharmacist ([place])    04/06/2020 8:35 PM

## 2020-04-06 NOTE — H&P (Signed)
Patient Demographics:    Devin [name], is a 74 y.o. male  MRN: [id number]   DOB - 06-Apr-1946  Admit Date - 04/06/2020  Outpatient Primary MD for the patient is [name], MD   Assessment & Plan:    Principal Problem:   Calculus of gallbladder and bile duct with acute cholecystitis Active Problems:   CAD (coronary artery disease), native coronary artery/H/o CABG x 4   Obstructive sleep apnea   Essential hypertension   Atrial fibrillation and flutter (HCC)   Chronic diastolic CHF (congestive heart failure) (HCC)   Hypogonadism in male    1) acute calculus cholecystitis--- surgical consult for possible lap chole -As needed IV opiates, IV antiemetics, and IV Zosyn -Discussed with general surgeon Dr. Constance Haw   2)H/o CAD--- status post 4 vessel bypass in March 2016--chest pain-free, no ACS type symptoms, continue aspirin and Lipitor, add metoprolol  3)BPH with LUTs--- Cardura as prescribed  4) hypogonadism--PTA was on testosterone supplements  Status is: Inpatient  Remains inpatient appropriate because:Ongoing active pain requiring inpatient pain management, IV treatments appropriate due to intensity of illness or inability to take PO and Inpatient level of care appropriate due to severity of illness   Dispo: The patient is from: Home              Anticipated d/c is to: Home              Anticipated d/c date is: 2 days              Patient currently is not medically stable to d/c. Barriers: Not Clinically Stable-needs lap chole for acute cholecystitis   With History of - Reviewed by me  Past Medical History:  Diagnosis Date  . Arthritis   . Asbestosis ([place])   . 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  .  Essential hypertension   . GERD (gastroesophageal reflux disease)   . Melanoma of back ([place])   . Prostatic hypertrophy   . Sleep apnea    Intolerant of CPAP      Past Surgical History:  Procedure Laterality Date  . ANTERIOR CERVICAL DECOMP/DISCECTOMY FUSION    . CARDIOVERSION N/A 01/28/2015   Procedure: CARDIOVERSION;  Surgeon: Arnoldo Lenis, MD;  Location: [facility];  Service: Endoscopy;  Laterality: N/A;  . CORONARY ARTERY BYPASS GRAFT N/A 12/29/2014   Procedure: CORONARY ARTERY BYPASS GRAFTING (CABG), ON PUMP, TIMES FOUR, USING LEFT INTERNAL MAMMARY ARTERY, RIGHT GREATER SAPHENOUS VEIN HARVESTED ENDOSCOPICALLY;  Surgeon: Ivin Poot, MD;  Location: [place];  Service: Open Heart Surgery;  Laterality: N/A;  . KNEE ARTHROSCOPY Right   . KNEE CARTILAGE SURGERY Left   . LEFT HEART CATHETERIZATION WITH CORONARY ANGIOGRAM N/A 12/28/2014   Procedure: LEFT HEART CATHETERIZATION WITH CORONARY ANGIOGRAM;  Surgeon: Lorretta Harp, MD;  Location: [facility] CATH  LAB;  Service: Cardiovascular;  Laterality: N/A;  . MELANOMA EXCISION     "back"  . SHOULDER OPEN ROTATOR CUFF REPAIR Left   . TEE WITHOUT CARDIOVERSION N/A 12/29/2014   Procedure: TRANSESOPHAGEAL ECHOCARDIOGRAM (TEE);  Surgeon: Ivin Poot, MD;  Location: [place];  Service: Open Heart Surgery;  Laterality: N/A;  . TEE WITHOUT CARDIOVERSION N/A 01/28/2015   Procedure: TRANSESOPHAGEAL ECHOCARDIOGRAM (TEE);  Surgeon: Arnoldo Lenis, MD;  Location: [facility];  Service: Endoscopy;  Laterality: N/A;      Chief Complaint  Patient presents with  . Chest Pain      HPI:    Devin [name]  is a 74 y.o. male with past medical history relevant for obesity and OSA but noncompliant with CPAP, history of CAD status post four-vessel CABG March 2016, BPH and hypertension who presents to the ED with epigastric pain and nausea  - No fever  Or chills  -Additional history obtained from patient's wife at bedside -Patient had persistent epigastric pain that  radiated to his back starting around 3 AM -Urinary symptoms no hematuria no emesis -CBC WNL with a white count of 4.4 and hemoglobin of 15.3 and platelet count of 152 -The unremarkable except for random glucose of 128, LFTs are not elevated and creatinine 0.9 -Lipase was WNL at 37 -Troponin x2 neg -Imaging studies in the ED -CT abdomen and pelvis with gallstones but without acute findings abdominal ultrasound consistent with acute cholecystitis with positive Murphy sign  -Chest x-ray without acute findings -General surgeon consulted by EDP, general surgeon requested for this admission due to CAD and other comorbid conditions   Review of systems:    In addition to the HPI above,   A full Review of  Systems was done, all other systems reviewed are negative except as noted above in HPI , .    Social History:  Reviewed by me    Social History   Tobacco Use  . Smoking status: Never Smoker  . Smokeless tobacco: Never Used  Substance Use Topics  . Alcohol use: Yes    Alcohol/week: 0.0 standard drinks    Comment: Occasional       Family History :  Reviewed by me    Family History  Problem Relation Age of Onset  . Stroke Mother   . Hypertension Mother   . Heart disease Sister        Died from complications of RHD  . Heart disease Brother        Heart failure related to EtOH and smoking  . Stomach cancer Sister      Home Medications:   Prior to Admission medications   Medication Sig Start Date End Date Taking? Authorizing Provider  aspirin 81 MG tablet Take 81 mg by mouth daily.   Yes [provider]  atorvastatin (LIPITOR) 10 MG tablet Take 10 mg by mouth daily.   Yes [provider]  clonazePAM (KLONOPIN) 0.5 MG tablet 0.5 mg at bedtime.  02/22/15  Yes [provider]  cyanocobalamin (,VITAMIN B-12,) 1000 MCG/ML injection INJECT 1ML AS DIRECTED EVERY 4 WEEKS 06/10/19  Yes [provider]  doxazosin (CARDURA) 4 MG tablet Take 1  tablet (4 mg total) by mouth 2 (two) times daily. 01/09/20  Yes Irine Seal, MD  PROAIR HFA 108 [phone] Base) MCG/ACT inhaler Inhale 2 puffs into the lungs every 4 (four) hours as needed.  09/19/19  Yes [provider]  testosterone cypionate (DEPOTESTOSTERONE CYPIONATE) 200 MG/ML injection INJECT 2  ML IM EVERY 2 WEEKS 07/28/19  Yes [provider]  torsemide (DEMADEX) 20 MG tablet Take 20 mg by mouth daily.   Yes [provider]  cyanocobalamin 1000 MCG tablet Take 1,000 mcg by mouth daily.    [provider]     Allergies:     Allergies  Allergen Reactions  . Lasix [Furosemide] Nausea Only     Physical Exam:   Vitals  Blood pressure (!) 142/75, pulse 74, temperature 98.2 F (36.8 C), temperature source Oral, resp. rate 16, height 6' (1.829 m), weight 127 kg, SpO2 94 %.  Physical Examination: General appearance - alert, well appearing, and in no distress  Mental status - alert, oriented to person, place, and time,  Eyes - sclera anicteric Neck - supple, no JVD elevation , Chest - clear  to auscultation bilaterally, symmetrical air movement,  Heart - S1 and S2 normal, regular , prior CABG scar Abdomen - soft,   Nondistended, increased work adiposity, RUQ tenderness with positive Murphy sign noted  neurological - screening mental status exam normal, neck supple without rigidity, cranial nerves II through XII intact, DTR's normal and symmetric Extremities - no pedal edema noted, intact peripheral pulses  Skin - warm, dry     Data Review:    CBC Recent Labs  Lab 04/06/20 0520  WBC 4.6  HGB 15.3  HCT 47.0  PLT 152  MCV 97.9  MCH 31.9  MCHC 32.6  RDW 14.6  LYMPHSABS 1.2  MONOABS 0.3  EOSABS 0.3  BASOSABS 0.0   ------------------------------------------------------------------------------------------------------------------  Chemistries  Recent Labs  Lab 04/06/20 0520  NA 137  K 3.8  CL 102  CO2 27  GLUCOSE 128*  BUN 20    CREATININE 0.99  CALCIUM 8.8*  AST 20  ALT 24  ALKPHOS 46  BILITOT 0.5   ------------------------------------------------------------------------------------------------------------------ estimated creatinine clearance is 90.2 mL/min (by C-G formula based on SCr of 0.99 mg/dL). ------------------------------------------------------------------------------------------------------------------ No results for input(s): TSH, T4TOTAL, T3FREE, THYROIDAB in the last 72 hours.  Invalid input(s): FREET3   Coagulation profile No results for input(s): INR, PROTIME in the last 168 hours. ------------------------------------------------------------------------------------------------------------------- No results for input(s): DDIMER in the last 72 hours. -------------------------------------------------------------------------------------------------------------------  Cardiac Enzymes No results for input(s): CKMB, TROPONINI, MYOGLOBIN in the last 168 hours.  Invalid input(s): CK ------------------------------------------------------------------------------------------------------------------ No results found for: BNP   ---------------------------------------------------------------------------------------------------------------  Urinalysis    Component Value Date/Time   COLORURINE YELLOW 01/08/2015 [place] 01/08/2015 1840   LABSPEC 1.025 01/08/2015 1840   PHURINE 5.5 01/08/2015 1840   GLUCOSEU NEGATIVE 01/08/2015 [place] NEGATIVE 01/08/2015 1840   BILIRUBINUR NEGATIVE 01/08/2015 1840   KETONESUR NEGATIVE 01/08/2015 1840   PROTEINUR NEGATIVE 01/08/2015 1840   UROBILINOGEN 0.2 01/08/2015 1840   NITRITE NEGATIVE 01/08/2015 1840   LEUKOCYTESUR NEGATIVE 01/08/2015 1840    ----------------------------------------------------------------------------------------------------------------   Imaging Results:    DG Chest Portable 1 View  Result Date:  04/06/2020 CLINICAL DATA:  Abdominal/epigastric pain EXAM: PORTABLE CHEST 1 VIEW COMPARISON:  04/07/2015 FINDINGS: Cardiomegaly. CABG. There is no edema, consolidation, effusion, or pneumothorax. No acute osseous finding IMPRESSION: 1. No acute finding. 2. Mild cardiomegaly. Electronically Signed   By: Monte Fantasia M.D.   On: 04/06/2020 05:40   CT Angio Abd/Pel W and/or Wo Contrast  Result Date: 04/06/2020 CLINICAL DATA:  Epigastric and back pain since 3 a.m. EXAM: CTA ABDOMEN AND PELVIS WITHOUT AND WITH CONTRAST TECHNIQUE: Multidetector CT imaging of the abdomen and pelvis was performed using  the standard protocol during bolus administration of intravenous contrast. Multiplanar reconstructed images and MIPs were obtained and reviewed to evaluate the vascular anatomy. CONTRAST:  154mL OMNIPAQUE IOHEXOL 350 MG/ML SOLN COMPARISON:  None. FINDINGS: VASCULAR Aorta: Diffuse atheromatous wall thickening with mixed density. No aneurysm or dissection. Celiac: Mild atherosclerosis.  No acute finding or stenosis. SMA: Mild atherosclerosis.  No acute finding or proximal stenosis. Renals: 3 left renal arteries. No flow limiting stenosis, branch occlusion, or beading. IMA: Patent Inflow: Atherosclerosis without stenosis or dissection. Proximal Outflow: Atherosclerosis. Veins: Negative in the arterial phase Review of the MIP images confirms the above findings. NON-VASCULAR Lower chest:  Extensive coronary atherosclerosis. Hepatobiliary: No focal liver abnormality.Cholelithiasis without evidence of cholecystitis. Pancreas: Unremarkable. Spleen: Unremarkable. Adrenals/Urinary Tract: Negative adrenals. No hydronephrosis or stone. Unremarkable bladder. Stomach/Bowel:  No obstruction. No appendicitis. Lymphatic: No mass or adenopathy. Reproductive:Enlarged prostate up lifting the bladder base. Other: No ascites or pneumoperitoneum. Musculoskeletal: No acute abnormalities. Advanced lumbar spine degeneration. IMPRESSION: VASCULAR  No emergent finding. Aortic Atherosclerosis without flow limiting stenosis of major branch vessels (ICD10-I70.0). Coronary atherosclerosis. NON-VASCULAR 1. No acute finding. 2. Cholelithiasis. Electronically Signed   By: Monte Fantasia M.D.   On: 04/06/2020 07:33   US Abdomen Limited RUQ  Result Date: 04/06/2020 CLINICAL DATA:  Right upper quadrant pain for 10 hours EXAM: ULTRASOUND ABDOMEN LIMITED RIGHT UPPER QUADRANT COMPARISON:  CTA [facility] 04/06/2020 FINDINGS: Gallbladder: The gallbladder appears distended measuring 11 cm in length. There is gallbladder wall thickening measuring 5 mm. Multiple stones are identified which measure up to 8 mm. Positive sonographic Murphy's sign. Negative for pericholecystic fluid. Common bile duct: Diameter: 6.1 mm Liver: No focal lesion identified. Within normal limits in parenchymal echogenicity. Portal vein is patent on color Doppler imaging with normal direction of blood flow towards the liver. Other: None. IMPRESSION: 1. Gallstones, gallbladder wall thickening and distention. A positive sonographic Murphy sign was elicited by the ultrasound technologist. Imaging findings are compatible with cholecystitis. If further imaging is clinically indicated consider nuclear medicine hepatic biliary scan to assess patency of the cystic duct. Electronically Signed   By: Kerby Moors M.D.   On: 04/06/2020 10:18    Radiological Exams on Admission: DG Chest Portable 1 View  Result Date: 04/06/2020 CLINICAL DATA:  Abdominal/epigastric pain EXAM: PORTABLE CHEST 1 VIEW COMPARISON:  04/07/2015 FINDINGS: Cardiomegaly. CABG. There is no edema, consolidation, effusion, or pneumothorax. No acute osseous finding IMPRESSION: 1. No acute finding. 2. Mild cardiomegaly. Electronically Signed   By: Monte Fantasia M.D.   On: 04/06/2020 05:40   CT Angio Abd/Pel W and/or Wo Contrast  Result Date: 04/06/2020 CLINICAL DATA:  Epigastric and back pain since 3 a.m. EXAM: CTA ABDOMEN AND PELVIS WITHOUT  AND WITH CONTRAST TECHNIQUE: Multidetector CT imaging of the abdomen and pelvis was performed using the standard protocol during bolus administration of intravenous contrast. Multiplanar reconstructed images and MIPs were obtained and reviewed to evaluate the vascular anatomy. CONTRAST:  170mL OMNIPAQUE IOHEXOL 350 MG/ML SOLN COMPARISON:  None. FINDINGS: VASCULAR Aorta: Diffuse atheromatous wall thickening with mixed density. No aneurysm or dissection. Celiac: Mild atherosclerosis.  No acute finding or stenosis. SMA: Mild atherosclerosis.  No acute finding or proximal stenosis. Renals: 3 left renal arteries. No flow limiting stenosis, branch occlusion, or beading. IMA: Patent Inflow: Atherosclerosis without stenosis or dissection. Proximal Outflow: Atherosclerosis. Veins: Negative in the arterial phase Review of the MIP images confirms the above findings. NON-VASCULAR Lower chest:  Extensive coronary atherosclerosis. Hepatobiliary: No focal liver abnormality.Cholelithiasis without  evidence of cholecystitis. Pancreas: Unremarkable. Spleen: Unremarkable. Adrenals/Urinary Tract: Negative adrenals. No hydronephrosis or stone. Unremarkable bladder. Stomach/Bowel:  No obstruction. No appendicitis. Lymphatic: No mass or adenopathy. Reproductive:Enlarged prostate up lifting the bladder base. Other: No ascites or pneumoperitoneum. Musculoskeletal: No acute abnormalities. Advanced lumbar spine degeneration. IMPRESSION: VASCULAR No emergent finding. Aortic Atherosclerosis without flow limiting stenosis of major branch vessels (ICD10-I70.0). Coronary atherosclerosis. NON-VASCULAR 1. No acute finding. 2. Cholelithiasis. Electronically Signed   By: Monte Fantasia M.D.   On: 04/06/2020 07:33   US Abdomen Limited RUQ  Result Date: 04/06/2020 CLINICAL DATA:  Right upper quadrant pain for 10 hours EXAM: ULTRASOUND ABDOMEN LIMITED RIGHT UPPER QUADRANT COMPARISON:  CTA [facility] 04/06/2020 FINDINGS: Gallbladder: The gallbladder appears  distended measuring 11 cm in length. There is gallbladder wall thickening measuring 5 mm. Multiple stones are identified which measure up to 8 mm. Positive sonographic Murphy's sign. Negative for pericholecystic fluid. Common bile duct: Diameter: 6.1 mm Liver: No focal lesion identified. Within normal limits in parenchymal echogenicity. Portal vein is patent on color Doppler imaging with normal direction of blood flow towards the liver. Other: None. IMPRESSION: 1. Gallstones, gallbladder wall thickening and distention. A positive sonographic Murphy sign was elicited by the ultrasound technologist. Imaging findings are compatible with cholecystitis. If further imaging is clinically indicated consider nuclear medicine hepatic biliary scan to assess patency of the cystic duct. Electronically Signed   By: Kerby Moors M.D.   On: 04/06/2020 10:18    DVT Prophylaxis -SCD  AM Labs Ordered, also please review Full Orders  Family Communication: Admission, patients condition and plan of care including tests being ordered have been discussed with the patient and wife at bedside who indicate understanding and agree with the plan   Code Status - Full Code  Likely DC to home postop if tolerating oral intake  Condition   stable  Roxan Hockey M.D on 04/06/2020 at 6:45 PM Go to [email or web address] -  for contact info  [facility] - Office  [phone]

## 2020-04-06 NOTE — ED Provider Notes (Signed)
Patient initially seen by Dr. Dina Rich.  Please see her note.  Patient CT scan did not show any acute findings other than known gallstones.  On repeat exam at that time the patient still had abdominal discomfort.  Right upper quadrant ultrasound was ordered.  Findings are consistent with acute cholecystitis.  I will start the patient on antibiotics and consult with general surgery.   Dorie Rank, MD 04/06/20 1030

## 2020-04-06 NOTE — Consult Note (Signed)
[facility] Consult  Reason for Consult: Acute cholecystitis  Referring Physician:  Dr. Denton Brick   Chief Complaint    Chest Pain      HPI: Devin [name] is a 74 y.o. male with sudden onset of epigastric type pain that was sharp and radiating to his back. He had a similar episode eating green peppers. He reports some chronic right sided pain he thought was muscle or from rib fracture.  He says he has known about gallstones for years. He has a history of CAD s/p CABG, he reports he never had a MI. He sees Dr. Domenic Polite for cardiology. He says he has some chronic pain with deep breathing that sounds more pleuritic in nature. Says he has some SOB, and that he had a bad respiratory infection January 2020 (17 months ago) he suspects was COVID and he has had to use an inhaler since that time.  He last saw Dr. Domenic Polite February 2021 and his last ECHO was February 2020. He reports worsening swelling in his legs since seeing Dr. Domenic Polite and inability to take lasix due to an allergy.  He denies any vomiting or bowel changes. He denies any fever or chills. He did have some nausea. His pain is improved now.   Past Medical History:  Diagnosis Date  . Arthritis   . 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  . Essential hypertension   . GERD (gastroesophageal reflux disease)   . Melanoma of back ([place])   . Prostatic hypertrophy   . Sleep apnea    Intolerant of CPAP    Past Surgical History:  Procedure Laterality Date  . ANTERIOR CERVICAL DECOMP/DISCECTOMY FUSION    . CARDIOVERSION N/A 01/28/2015   Procedure: CARDIOVERSION;  Surgeon: Arnoldo Lenis, MD;  Location: [facility];  Service: Endoscopy;  Laterality: N/A;  . CORONARY ARTERY BYPASS GRAFT N/A 12/29/2014   Procedure: CORONARY ARTERY BYPASS GRAFTING (CABG), ON PUMP, TIMES FOUR, USING LEFT INTERNAL MAMMARY ARTERY, RIGHT  GREATER SAPHENOUS VEIN HARVESTED ENDOSCOPICALLY;  Surgeon: Ivin Poot, MD;  Location: [place];  Service: Open Heart Surgery;  Laterality: N/A;  . KNEE ARTHROSCOPY Right   . KNEE CARTILAGE SURGERY Left   . LEFT HEART CATHETERIZATION WITH CORONARY ANGIOGRAM N/A 12/28/2014   Procedure: LEFT HEART CATHETERIZATION WITH CORONARY ANGIOGRAM;  Surgeon: Lorretta Harp, MD;  Location: [facility] C[facility];  Service: Cardiovascular;  Laterality: N/A;  . MELANOMA EXCISION     "back"  . SHOULDER OPEN ROTATOR CUFF REPAIR Left   . TEE WITHOUT CARDIOVERSION N/A 12/29/2014   Procedure: TRANSESOPHAGEAL ECHOCARDIOGRAM (TEE);  Surgeon: Ivin Poot, MD;  Location: [place];  Service: Open Heart Surgery;  Laterality: N/A;  . TEE WITHOUT CARDIOVERSION N/A 01/28/2015   Procedure: TRANSESOPHAGEAL ECHOCARDIOGRAM (TEE);  Surgeon: Arnoldo Lenis, MD;  Location: [facility];  Service: Endoscopy;  Laterality: N/A;    Family History  Problem Relation Age of Onset  . Stroke Mother   . Hypertension Mother   . Heart disease Sister        Died from complications of RHD  . Heart disease Brother        Heart failure related to EtOH and smoking  . Stomach cancer Sister     Social History   Tobacco Use  . Smoking status: Never Smoker  . Smokeless tobacco: Never Used  Substance Use Topics  . Alcohol use: Yes    Alcohol/week: 0.0 standard drinks    Comment: Occasional  . Drug use: No    Medications: I have reviewed the patient's current medications. Current Facility-Administered Medications  Medication Dose Route Frequency Provider Last Rate Last Admin  . 0.9 %  sodium chloride infusion  250 mL Intravenous PRN Emokpae, Courage, MD      . 0.9 %  sodium chloride infusion   Intravenous Continuous Emokpae, Courage, MD      . acetaminophen (TYLENOL) tablet 650 mg  650 mg Oral Q6H PRN Emokpae, Courage, MD       Or  . acetaminophen (TYLENOL) suppository 650 mg  650 mg Rectal Q6H PRN Emokpae, Courage, MD      . albuterol  (PROVENTIL) (2.5 MG/3ML) 0.083% nebulizer solution 2.5 mg  2.5 mg Nebulization Q2H PRN Emokpae, Courage, MD      . albuterol (PROVENTIL) (2.5 MG/3ML) 0.083% nebulizer solution 3 mL  3 mL Nebulization Q4H PRN Emokpae, Courage, MD      . aspirin chewable tablet 81 mg  81 mg Oral Q breakfast Emokpae, Courage, MD      . atorvastatin (LIPITOR) tablet 10 mg  10 mg Oral Daily Emokpae, Courage, MD      . clonazePAM (KLONOPIN) tablet 0.5 mg  0.5 mg Oral QHS Emokpae, Courage, MD      . doxazosin (CARDURA) tablet 4 mg  4 mg Oral BID Emokpae, Courage, MD      . fentaNYL (SUBLIMAZE) injection 12.5-50 mcg  12.5-50 mcg Intravenous Q2H PRN Denton Brick, Courage, MD      . Derrill Memo ON 04/08/2020] heparin injection 5,000 Units  5,000 Units Subcutaneous Q8H Emokpae, Courage, MD      . metoprolol tartrate (LOPRESSOR) tablet 25 mg  25 mg Oral BID Emokpae, Courage, MD      . ondansetron (ZOFRAN) tablet 4 mg  4 mg Oral Q6H PRN Emokpae, Courage, MD       Or  . ondansetron (ZOFRAN) injection 4 mg  4 mg Intravenous Q6H PRN Emokpae, Courage, MD      . oxyCODONE (Oxy IR/ROXICODONE) immediate release tablet 5 mg  5 mg Oral Q4H PRN Emokpae, Courage, MD      . polyethylene glycol (MIRALAX / GLYCOLAX) packet 17 g  17 g Oral Daily PRN Emokpae, Courage, MD      . sodium chloride flush (NS) 0.9 % injection 3 mL  3 mL Intravenous Q12H Emokpae, Courage, MD      . sodium chloride flush (NS) 0.9 % injection 3 mL  3 mL Intravenous PRN Emokpae, Courage, MD      . traZODone (DESYREL) tablet 50 mg  50 mg Oral QHS PRN Emokpae, Courage, MD      . vitamin B-12 (CYANOCOBALAMIN) tablet 1,000 mcg  1,000 mcg Oral Daily Emokpae, Courage, MD        Allergies  Allergen Reactions  . Lasix [Furosemide] Nausea Only     ROS:  A comprehensive review of systems was negative except for: Respiratory: positive for pleurisy/chest pain and chronic SOB, wheezing/ uses inhaler Cardiovascular: positive for bilateral lower edema, worsening Gastrointestinal:  positive for abdominal pain and nausea  Blood pressure 127/77, pulse 71, temperature 98.3 F (36.8 C), resp. rate 20, height 6' (1.829 m), weight 127 kg, SpO2 94 %. Physical Exam Vitals reviewed.  HENT:     Head: Normocephalic.  Cardiovascular:     Rate and Rhythm: Normal rate.  Pulmonary:  Effort: Pulmonary effort is normal. No tachypnea or respiratory distress.  Abdominal:     Palpations: Abdomen is soft.     Tenderness: There is abdominal tenderness.  Musculoskeletal:     Cervical back: Normal range of motion.     Right lower leg: Edema present.     Left lower leg: Edema present.  Neurological:     General: No focal deficit present.     Mental Status: He is alert and oriented to person, place, and time.  Psychiatric:        Mood and Affect: Mood normal.        Behavior: Behavior normal.     Results: Results for orders placed or performed during the hospital encounter of 04/06/20 (from the past 48 hour(s))  CBC with Differential     Status: None   Collection Time: 04/06/20  5:20 AM  Result Value Ref Range   WBC 4.6 4.0 - 10.5 K/uL   RBC 4.80 4.22 - 5.81 MIL/uL   Hemoglobin 15.3 13.0 - 17.0 g/dL   HCT 47.0 39.0 - 52.0 %   MCV 97.9 80.0 - 100.0 fL   MCH 31.9 26.0 - 34.0 pg   MCHC 32.6 30.0 - 36.0 g/dL   RDW 14.6 11.5 - 15.5 %   Platelets 152 150 - 400 K/uL   nRBC 0.0 0.0 - 0.2 %   Neutrophils Relative % 61 %   Neutro Abs 2.8 1.7 - 7.7 K/uL   Lymphocytes Relative 26 %   Lymphs Abs 1.2 0.7 - 4.0 K/uL   Monocytes Relative 6 %   Monocytes Absolute 0.3 0.1 - 1.0 K/uL   Eosinophils Relative 7 %   Eosinophils Absolute 0.3 0.0 - 0.5 K/uL   Basophils Relative 0 %   Basophils Absolute 0.0 0.0 - 0.1 K/uL   Immature Granulocytes 0 %   Abs Immature Granulocytes 0.01 0.00 - 0.07 K/uL    Comment: Performed at [facility], [address], [place], [place] [address]  Comprehensive metabolic panel     Status: Abnormal   Collection Time: 04/06/20  5:20 AM  Result Value Ref  Range   Sodium 137 135 - 145 mmol/L   Potassium 3.8 3.5 - 5.1 mmol/L   Chloride 102 98 - 111 mmol/L   CO2 27 22 - 32 mmol/L   Glucose, Bld 128 (H) 70 - 99 mg/dL    Comment: Glucose reference range applies only to samples taken after fasting for at least 8 hours.   BUN 20 8 - 23 mg/dL   Creatinine, Ser 0.99 0.61 - 1.24 mg/dL   Calcium 8.8 (L) 8.9 - 10.3 mg/dL   Total Protein 7.0 6.5 - 8.1 g/dL   Albumin 4.0 3.5 - 5.0 g/dL   AST 20 15 - 41 U/L   ALT 24 0 - 44 U/L   Alkaline Phosphatase 46 38 - 126 U/L   Total Bilirubin 0.5 0.3 - 1.2 mg/dL   GFR calc non Af Amer >60 >60 mL/min   GFR calc Af Amer >60 >60 mL/min   Anion gap 8 5 - 15    Comment: Performed at [facility] [address] [place], [place] [address]  Lipase, blood     Status: None   Collection Time: 04/06/20  5:20 AM  Result Value Ref Range   Lipase 37 11 - 51 U/L    Comment: Performed at [facility], [address] [place], [place] [address]  Troponin I (High Sensitivity)  Status: None   Collection Time: 04/06/20  5:20 AM  Result Value Ref Range   Troponin I (High Sensitivity) 4 <18 ng/L    Comment: (NOTE) Elevated high sensitivity troponin I (hsTnI) values and significant  changes across serial measurements may suggest ACS but many other  chronic and acute conditions are known to elevate hsTnI results.  Refer to the "Links" section for chest pain algorithms and additional  guidance. Performed at [facility], [address], [place], [place] [address]   Troponin I (High Sensitivity)     Status: None   Collection Time: 04/06/20  7:30 AM  Result Value Ref Range   Troponin I (High Sensitivity) 4 <18 ng/L    Comment: (NOTE) Elevated high sensitivity troponin I (hsTnI) values and significant  changes across serial measurements may suggest ACS but many other  chronic and acute conditions are known to elevate hsTnI results.  Refer to the "Links" section for chest pain algorithms and additional  guidance. Performed  at [facility], [address] [place], [place] [address]    Personally reviewed- gallbladder with stones, mildly thick at 4.45mm, CBD 22mm  DG Chest Portable 1 View  Result Date: 04/06/2020 CLINICAL DATA:  Abdominal/epigastric pain EXAM: PORTABLE CHEST 1 VIEW COMPARISON:  04/07/2015 FINDINGS: Cardiomegaly. CABG. There is no edema, consolidation, effusion, or pneumothorax. No acute osseous finding IMPRESSION: 1. No acute finding. 2. Mild cardiomegaly. Electronically Signed   By: Monte Fantasia M.D.   On: 04/06/2020 05:40   CT Angio Abd/Pel W and/or Wo Contrast  Result Date: 04/06/2020 CLINICAL DATA:  Epigastric and back pain since 3 a.m. EXAM: CTA ABDOMEN AND PELVIS WITHOUT AND WITH CONTRAST TECHNIQUE: Multidetector CT imaging of the abdomen and pelvis was performed using the standard protocol during bolus administration of intravenous contrast. Multiplanar reconstructed images and MIPs were obtained and reviewed to evaluate the vascular anatomy. CONTRAST:  110mL OMNIPAQUE IOHEXOL 350 MG/ML SOLN COMPARISON:  None. FINDINGS: VASCULAR Aorta: Diffuse atheromatous wall thickening with mixed density. No aneurysm or dissection. Celiac: Mild atherosclerosis.  No acute finding or stenosis. SMA: Mild atherosclerosis.  No acute finding or proximal stenosis. Renals: 3 left renal arteries. No flow limiting stenosis, branch occlusion, or beading. IMA: Patent Inflow: Atherosclerosis without stenosis or dissection. Proximal Outflow: Atherosclerosis. Veins: Negative in the arterial phase Review of the MIP images confirms the above findings. NON-VASCULAR Lower chest:  Extensive coronary atherosclerosis. Hepatobiliary: No focal liver abnormality.Cholelithiasis without evidence of cholecystitis. Pancreas: Unremarkable. Spleen: Unremarkable. Adrenals/Urinary Tract: Negative adrenals. No hydronephrosis or stone. Unremarkable bladder. Stomach/Bowel:  No obstruction. No appendicitis. Lymphatic: No mass or adenopathy.  Reproductive:Enlarged prostate up lifting the bladder base. Other: No ascites or pneumoperitoneum. Musculoskeletal: No acute abnormalities. Advanced lumbar spine degeneration. IMPRESSION: VASCULAR No emergent finding. Aortic Atherosclerosis without flow limiting stenosis of major branch vessels (ICD10-I70.0). Coronary atherosclerosis. NON-VASCULAR 1. No acute finding. 2. Cholelithiasis. Electronically Signed   By: Monte Fantasia M.D.   On: 04/06/2020 07:33   US Abdomen Limited RUQ  Result Date: 04/06/2020 CLINICAL DATA:  Right upper quadrant pain for 10 hours EXAM: ULTRASOUND ABDOMEN LIMITED RIGHT UPPER QUADRANT COMPARISON:  [facility] 04/06/2020 FINDINGS: Gallbladder: The gallbladder appears distended measuring 11 cm in length. There is gallbladder wall thickening measuring 5 mm. Multiple stones are identified which measure up to 8 mm. Positive sonographic Murphy's sign. Negative for pericholecystic fluid. Common bile duct: Diameter: 6.1 mm Liver: No focal lesion identified. Within normal limits in parenchymal echogenicity. Portal vein is patent on color Doppler imaging  with normal direction of blood flow towards the liver. Other: None. IMPRESSION: 1. Gallstones, gallbladder wall thickening and distention. A positive sonographic Murphy sign was elicited by the ultrasound technologist. Imaging findings are compatible with cholecystitis. If further imaging is clinically indicated consider nuclear medicine hepatic biliary scan to assess patency of the cystic duct. Electronically Signed   By: Kerby Moors M.D.   On: 04/06/2020 10:18     Assessment & Plan:  Devin [name] is a 74 y.o. male with potential acute cholecystitis versus chronic cholecystitis. Patient had an event that he wanted to leave AMA for, but has decided to stay. Discussed that he could have acute cholecystitis or this could be chronic inflammation. Discussed options of surgery, HIDA scan, or treatment with antibiotics and outpatient surgery.  At  this time he has rescheduled his event. He wants to proceed with surgery. With the worsening leg edema I think he needs a repeat ECHO, and will get cardiology to weigh in tomorrow to ensure no further workup needed.  His last ECHO was 16 months ago.      -NPO midnight  -ECHO in AM and cardiology risk stratify given history, worsening edema, some pleuritic pain/ but no real chest pressure, ED work up negative  -Antibiotics now for potential acute cholecystitis   PLAN: I counseled the patient about the indication, risks and benefits of laparoscopic cholecystectomy.  He understands there is a very small chance for bleeding, infection, injury to normal structures (including common bile duct), conversion to open surgery, persistent symptoms, evolution of postcholecystectomy diarrhea, need for secondary interventions, anesthesia reaction, cardiopulmonary issues and other risks not specifically detailed here. I described the expected recovery, the plan for follow-up and the restrictions during the recovery phase.  All questions were answered.  All questions were answered to the satisfaction of the patient and family.   Virl Cagey 04/06/2020, 10:53 AM

## 2020-04-06 NOTE — ED Notes (Signed)
hospitalist in room with pt 

## 2020-04-07 ENCOUNTER — Encounter (HOSPITAL_COMMUNITY)
Admission: EM | Disposition: A | Discharge status: Home / Self Care | Payer: Self-pay | Source: Home / Self Care | Attending: Family Medicine

## 2020-04-07 ENCOUNTER — Inpatient Hospital Stay (HOSPITAL_COMMUNITY): Payer: Medicare Other | Admitting: Anesthesiology

## 2020-04-07 ENCOUNTER — Inpatient Hospital Stay (HOSPITAL_COMMUNITY): Payer: Medicare Other

## 2020-04-07 DIAGNOSIS — K8000 Calculus of gallbladder with acute cholecystitis without obstruction: Secondary | ICD-10-CM

## 2020-04-07 DIAGNOSIS — Z01810 Encounter for preprocedural cardiovascular examination: Secondary | ICD-10-CM

## 2020-04-07 DIAGNOSIS — I10 Essential (primary) hypertension: Secondary | ICD-10-CM

## 2020-04-07 DIAGNOSIS — E785 Hyperlipidemia, unspecified: Secondary | ICD-10-CM

## 2020-04-07 DIAGNOSIS — I25119 Atherosclerotic heart disease of native coronary artery with unspecified angina pectoris: Secondary | ICD-10-CM

## 2020-04-07 DIAGNOSIS — I361 Nonrheumatic tricuspid (valve) insufficiency: Secondary | ICD-10-CM

## 2020-04-07 HISTORY — PX: CHOLECYSTECTOMY: SHX55

## 2020-04-07 LAB — SURGICAL PCR SCREEN
MRSA, PCR: NEGATIVE
Staphylococcus aureus: NEGATIVE

## 2020-04-07 LAB — CBC
HCT: 45.5 % (ref 39.0–52.0)
Hemoglobin: 14.6 g/dL (ref 13.0–17.0)
MCH: 31.9 pg (ref 26.0–34.0)
MCHC: 32.1 g/dL (ref 30.0–36.0)
MCV: 99.3 fL (ref 80.0–100.0)
Platelets: 148 10*3/uL — ABNORMAL LOW (ref 150–400)
RBC: 4.58 MIL/uL (ref 4.22–5.81)
RDW: 14.9 % (ref 11.5–15.5)
WBC: 5.4 10*3/uL (ref 4.0–10.5)
nRBC: 0 % (ref 0.0–0.2)

## 2020-04-07 LAB — COMPREHENSIVE METABOLIC PANEL
ALT: 25 U/L (ref 0–44)
AST: 16 U/L (ref 15–41)
Albumin: 3.7 g/dL (ref 3.5–5.0)
Alkaline Phosphatase: 49 U/L (ref 38–126)
Anion gap: 8 (ref 5–15)
BUN: 15 mg/dL (ref 8–23)
CO2: 29 mmol/L (ref 22–32)
Calcium: 8.6 mg/dL — ABNORMAL LOW (ref 8.9–10.3)
Chloride: 99 mmol/L (ref 98–111)
Creatinine, Ser: 1.17 mg/dL (ref 0.61–1.24)
GFR calc Af Amer: >60 mL/min (ref >60)
GFR calc non Af Amer: >60 mL/min (ref >60)
Glucose, Bld: 113 mg/dL — ABNORMAL HIGH (ref 70–99)
Potassium: 4.1 mmol/L (ref 3.5–5.1)
Sodium: 136 mmol/L (ref 135–145)
Total Bilirubin: 1.1 mg/dL (ref 0.3–1.2)
Total Protein: 6.8 g/dL (ref 6.5–8.1)

## 2020-04-07 LAB — SARS CORONAVIRUS 2 BY RT PCR (HOSPITAL ORDER, PERFORMED IN ~~LOC~~ HOSPITAL LAB): SARS Coronavirus 2: NEGATIVE

## 2020-04-07 LAB — ECHOCARDIOGRAM COMPLETE
Height: 72 in
Weight: 4480 oz

## 2020-04-07 SURGERY — LAPAROSCOPIC CHOLECYSTECTOMY
Anesthesia: General | Site: Abdomen

## 2020-04-07 MED ORDER — DOCUSATE SODIUM 100 MG PO CAPS
100.0000 mg | ORAL_CAPSULE | Freq: Two times a day (BID) | ORAL | Status: DC
  Administered 2020-04-08: 100 mg via ORAL
  Filled 2020-04-07: qty 1

## 2020-04-07 MED ORDER — IPRATROPIUM-ALBUTEROL 0.5-2.5 (3) MG/3ML IN SOLN
3.0000 mL | Freq: Once | RESPIRATORY_TRACT | Status: AC
  Administered 2020-04-07: 3 mL via RESPIRATORY_TRACT

## 2020-04-07 MED ORDER — FENTANYL CITRATE (PF) 100 MCG/2ML IJ SOLN
INTRAMUSCULAR | Status: DC | PRN
  Administered 2020-04-07: 100 ug via INTRAVENOUS
  Administered 2020-04-07 (×2): 50 ug via INTRAVENOUS

## 2020-04-07 MED ORDER — FENTANYL CITRATE (PF) 250 MCG/5ML IJ SOLN
INTRAMUSCULAR | Status: AC
  Filled 2020-04-07: qty 5

## 2020-04-07 MED ORDER — MORPHINE SULFATE (PF) 2 MG/ML IV SOLN: 2.0000 mg | INTRAVENOUS | Status: DC | PRN

## 2020-04-07 MED ORDER — LIDOCAINE HCL (CARDIAC) PF 50 MG/5ML IV SOSY
PREFILLED_SYRINGE | INTRAVENOUS | Status: DC | PRN
  Administered 2020-04-07: 100 mg via INTRAVENOUS

## 2020-04-07 MED ORDER — BUPIVACAINE HCL (PF) 0.5 % IJ SOLN
INTRAMUSCULAR | Status: DC | PRN
  Administered 2020-04-07: 10 mL

## 2020-04-07 MED ORDER — CHLORHEXIDINE GLUCONATE 0.12 % MT SOLN
15.0000 mL | Freq: Once | OROMUCOSAL | Status: AC
  Administered 2020-04-07: 15 mL via OROMUCOSAL
  Filled 2020-04-07: qty 15

## 2020-04-07 MED ORDER — BUPIVACAINE HCL (PF) 0.5 % IJ SOLN
INTRAMUSCULAR | Status: AC
  Filled 2020-04-07: qty 30

## 2020-04-07 MED ORDER — SUGAMMADEX SODIUM 200 MG/2ML IV SOLN
INTRAVENOUS | Status: DC | PRN
  Administered 2020-04-07: 200 mg via INTRAVENOUS

## 2020-04-07 MED ORDER — 0.9 % SODIUM CHLORIDE (POUR BTL) OPTIME
TOPICAL | Status: DC | PRN
  Administered 2020-04-07: 1000 mL

## 2020-04-07 MED ORDER — HEMOSTATIC AGENTS (NO CHARGE) OPTIME
TOPICAL | Status: DC | PRN
  Administered 2020-04-07: 1 via TOPICAL

## 2020-04-07 MED ORDER — PROPOFOL 10 MG/ML IV BOLUS
INTRAVENOUS | Status: DC | PRN
  Administered 2020-04-07: 120 mg via INTRAVENOUS

## 2020-04-07 MED ORDER — ORAL CARE MOUTH RINSE: 15.0000 mL | Freq: Once | OROMUCOSAL | Status: AC

## 2020-04-07 MED ORDER — LACTATED RINGERS IV SOLN
INTRAVENOUS | Status: DC | PRN
Start: 2020-04-07 — End: 2020-04-07

## 2020-04-07 MED ORDER — SUCCINYLCHOLINE 20MG/ML (10ML) SYRINGE FOR MEDFUSION PUMP - OPTIME
INTRAMUSCULAR | Status: DC | PRN
  Administered 2020-04-07: 120 mg via INTRAVENOUS

## 2020-04-07 MED ORDER — PROPOFOL 10 MG/ML IV BOLUS
INTRAVENOUS | Status: AC
  Filled 2020-04-07: qty 20

## 2020-04-07 MED ORDER — MUPIROCIN 2 % EX OINT
1.0000 "application " | TOPICAL_OINTMENT | Freq: Two times a day (BID) | CUTANEOUS | Status: DC
  Administered 2020-04-07 – 2020-04-08 (×2): 1 via NASAL
  Filled 2020-04-07: qty 22

## 2020-04-07 MED ORDER — LACTATED RINGERS IV SOLN: Freq: Once | INTRAVENOUS | Status: AC

## 2020-04-07 MED ORDER — IPRATROPIUM-ALBUTEROL 0.5-2.5 (3) MG/3ML IN SOLN
RESPIRATORY_TRACT | Status: AC
  Filled 2020-04-07: qty 3

## 2020-04-07 MED ORDER — HYDROMORPHONE HCL 1 MG/ML IJ SOLN
0.2500 mg | INTRAMUSCULAR | Status: DC | PRN
  Administered 2020-04-07: 0.5 mg via INTRAVENOUS
  Filled 2020-04-07: qty 0.5

## 2020-04-07 MED ORDER — EPHEDRINE SULFATE 50 MG/ML IJ SOLN
INTRAMUSCULAR | Status: DC | PRN
Start: 2020-04-07 — End: 2020-04-07
  Administered 2020-04-07: 5 mg via INTRAVENOUS
  Administered 2020-04-07: 7.5 mg via INTRAVENOUS

## 2020-04-07 MED ORDER — ROCURONIUM 10MG/ML (10ML) SYRINGE FOR MEDFUSION PUMP - OPTIME
INTRAVENOUS | Status: DC | PRN
  Administered 2020-04-07: 40 mg via INTRAVENOUS

## 2020-04-07 SURGICAL SUPPLY — 48 items
ADH SKN CLS APL DERMABOND .7 (GAUZE/BANDAGES/DRESSINGS) ×1
APPLIER CLIP ROT 10 11.4 M/L (STAPLE) ×3
APR CLP MED LRG 11.4X10 (STAPLE) ×1
BAG RETRIEVAL 10 (BASKET) ×1
BAG RETRIEVAL 10MM (BASKET) ×1
BLADE SURG 15 STRL LF DISP TIS (BLADE) ×1 IMPLANT
BLADE SURG 15 STRL SS (BLADE) ×3
CHLORAPREP W/TINT 26 (MISCELLANEOUS) ×3 IMPLANT
CLIP APPLIE ROT 10 11.4 M/L (STAPLE) ×1 IMPLANT
CLOTH BEACON ORANGE TIMEOUT ST (SAFETY) ×3 IMPLANT
COVER LIGHT HANDLE STERIS (MISCELLANEOUS) ×6 IMPLANT
COVER WAND RF STERILE (DRAPES) ×3 IMPLANT
DECANTER SPIKE VIAL GLASS SM (MISCELLANEOUS) ×3 IMPLANT
DERMABOND ADVANCED (GAUZE/BANDAGES/DRESSINGS) ×2
DERMABOND ADVANCED .7 DNX12 (GAUZE/BANDAGES/DRESSINGS) ×1 IMPLANT
ELECT REM PT RETURN 9FT ADLT (ELECTROSURGICAL) ×3
ELECTRODE REM PT RTRN 9FT ADLT (ELECTROSURGICAL) ×1 IMPLANT
GLOVE BIO SURGEON STRL SZ 6.5 (GLOVE) ×2 IMPLANT
GLOVE BIO SURGEONS STRL SZ 6.5 (GLOVE) ×1
GLOVE BIOGEL PI IND STRL 6.5 (GLOVE) ×1 IMPLANT
GLOVE BIOGEL PI IND STRL 7.0 (GLOVE) ×3 IMPLANT
GLOVE BIOGEL PI INDICATOR 6.5 (GLOVE) ×4
GLOVE BIOGEL PI INDICATOR 7.0 (GLOVE) ×8
GLOVE ECLIPSE 6.5 STRL STRAW (GLOVE) ×2 IMPLANT
GLOVE ECLIPSE 7.0 STRL STRAW (GLOVE) ×2 IMPLANT
GOWN STRL REUS W/TWL LRG LVL3 (GOWN DISPOSABLE) ×9 IMPLANT
HEMOSTAT SNOW SURGICEL 2X4 (HEMOSTASIS) ×3 IMPLANT
INST SET LAPROSCOPIC AP (KITS) ×3 IMPLANT
KIT TURNOVER KIT A (KITS) ×3 IMPLANT
MANIFOLD NEPTUNE II (INSTRUMENTS) ×3 IMPLANT
NDL INSUFFLATION 14GA 120MM (NEEDLE) ×1 IMPLANT
NEEDLE INSUFFLATION 14GA 120MM (NEEDLE) ×3 IMPLANT
NS IRRIG 1000ML POUR BTL (IV SOLUTION) ×3 IMPLANT
PACK LAP CHOLE LZT030E (CUSTOM PROCEDURE TRAY) ×3 IMPLANT
PAD ARMBOARD 7.5X6 YLW CONV (MISCELLANEOUS) ×3 IMPLANT
SET BASIN LINEN APH (SET/KITS/TRAYS/PACK) ×3 IMPLANT
SET TUBE SMOKE EVAC HIGH FLOW (TUBING) ×3 IMPLANT
SLEEVE ENDOPATH XCEL 5M (ENDOMECHANICALS) ×3 IMPLANT
SUT MNCRL AB 4-0 PS2 18 (SUTURE) ×6 IMPLANT
SUT VICRYL 0 UR6 27IN ABS (SUTURE) ×3 IMPLANT
SYS BAG RETRIEVAL 10MM (BASKET) ×1
SYSTEM BAG RETRIEVAL 10MM (BASKET) ×1 IMPLANT
TROCAR ENDO BLADELESS 11MM (ENDOMECHANICALS) ×3 IMPLANT
TROCAR XCEL NON-BLD 5MMX100MML (ENDOMECHANICALS) ×3 IMPLANT
TROCAR XCEL UNIV SLVE 11M 100M (ENDOMECHANICALS) ×3 IMPLANT
TUBE CONNECTING 12'X1/4 (SUCTIONS) ×1
TUBE CONNECTING 12X1/4 (SUCTIONS) ×2 IMPLANT
WARMER LAPAROSCOPE (MISCELLANEOUS) ×3 IMPLANT

## 2020-04-07 NOTE — Transfer of Care (Signed)
Immediate Anesthesia Transfer of Care Note  Patient: Devin [name]  Procedure(s) Performed: LAPAROSCOPIC CHOLECYSTECTOMY (N/A Abdomen)  Patient Location: PACU  Anesthesia Type:General  Level of Consciousness: awake  Airway & Oxygen Therapy: Patient Spontanous Breathing and Patient connected to face mask oxygen  Post-op Assessment: Report given to RN  Post vital signs: Reviewed and stable  Last Vitals:  Vitals Value Taken Time  BP 146/76 04/07/20 1246  Temp    Pulse 67 04/07/20 1250  Resp 14 04/07/20 1250  SpO2 99 % 04/07/20 1250  Vitals shown include unvalidated device data.  Last Pain:  Vitals:   04/07/20 1049  TempSrc: Oral  PainSc: 0-No pain         Complications: No apparent anesthesia complications

## 2020-04-07 NOTE — Anesthesia Preprocedure Evaluation (Addendum)
Anesthesia Evaluation  Patient identified by MRN, date of birth, ID band Patient awake    Reviewed: Allergy & Precautions, NPO status , Patient's Chart, lab work & pertinent test results  History of Anesthesia Complications Negative for: history of anesthetic complications  Airway Mallampati: III  TM Distance: >3 FB Neck ROM: Limited   Comment: ACDF Dental  (+) Missing, Dental Advisory Given, Implants   Pulmonary sleep apnea (NON COMPLIANT WITH CPAP) ,  COMPARISON:  02/24/2015. For 03/2015. 01/15/2015. 12/31/2014. CT 11/18/2010.  FINDINGS: Mediastinum hilar structures are normal. Prior CABG. Cardiomegaly normal pulmonary vascularity. Stable nodular opacity projected over the right lung base on PA view only consistent with nipple shadow. Prior cervical spine fusion.  IMPRESSION: 1. Prior CABG.  Stable cardiomegaly. 2. No acute pulmonary disease.   Electronically Signed   By: Marcello Moores  [place]   On: 04/07/2015 13:11   Pulmonary exam normal breath sounds clear to auscultation       Cardiovascular METS: 3 - Mets hypertension, Pt. on medications + CAD, + CABG and +CHF  Normal cardiovascular exam+ dysrhythmias Atrial Fibrillation  Rhythm:Regular Rate:Normal - Systolic murmurs, - Diastolic murmurs, - Friction Rub, - Carotid Bruit, - Peripheral Edema and - Systolic Click 1. Left ventricular ejection fraction, by estimation, is 55 to 60%. The  left ventricle has normal function. The left ventricle has no regional  wall motion abnormalities. There is mild left ventricular hypertrophy.  Left ventricular diastolic parameters  are consistent with Grade II diastolic dysfunction (pseudonormalization).  There is the interventricular septum is flattened in diastole ('D' shaped  left ventricle), consistent with right ventricular volume overload.  2. Right ventricular systolic function is mildly to moderately reduced.  The right  ventricular size is moderately enlarged. There is normal  pulmonary artery systolic pressure. The estimated right ventricular  systolic pressure is 53.6 mmHg.  3. Left atrial size was mildly dilated.  4. Right atrial size was mildly dilated.  5. The mitral valve is abnormal, mildly thickened and calcified with  noderate to severe annular calcification. Trivial mitral valve  regurgitation.  6. The aortic valve is tricuspid. Aortic valve regurgitation is trivial.  7. The inferior vena cava is normal in size with greater than 50%  respiratory variability, suggesting right atrial pressure of 3 mmHg.   06-Apr-2020 05:10:19 [facility] ROUTINE RECORD Sinus rhythm Right bundle branch block Similar morphology to prior Confirmed by Thayer Jew [phone]) on 04/06/2020 5:40:35 AM   Neuro/Psych negative neurological ROS  negative psych ROS   GI/Hepatic Neg liver ROS, GERD  Medicated,  Endo/Other  negative endocrine ROS  Renal/GU negative Renal ROS  negative genitourinary   Musculoskeletal  (+) Arthritis  (ACDF), Osteoarthritis,    Abdominal   Peds  Hematology negative hematology ROS (+)   Anesthesia Other Findings   Reproductive/Obstetrics negative OB ROS                           Anesthesia Physical Anesthesia Plan  ASA: IV  Anesthesia Plan: General   Post-op Pain Management:    Induction: Intravenous  PONV Risk Score and Plan: 4 or greater and Ondansetron, Dexamethasone and Metaclopromide  Airway Management Planned: Oral ETT  Additional Equipment:   Intra-op Plan:   Post-operative Plan:   Informed Consent:   Plan Discussed with:   Anesthesia Plan Comments:        Anesthesia Quick Evaluation

## 2020-04-07 NOTE — Progress Notes (Signed)
2D echo complete 

## 2020-04-07 NOTE — Consult Note (Addendum)
Cardiology Consult    Patient ID: ERHARD SENSKE; [id number]; [birth year]   Admit date: 04/06/2020 Date of Consult: 04/07/2020  Primary Care Provider: [name], MD Primary Cardiologist: Rozann Lesches, MD   Patient Profile    Devin [name] is a 74 y.o. male with past medical history of CAD (s/p CABG in 12/2014 with LIMA-LAD, SVG-OM1, SVG-OM2 and SVG-PDA), postoperative atrial flutter (occurring after CABG in 2016 --> s/p TEE/DCCV and no known recurrence since), HTN, HLD and chronic lower extremity edema who is being seen today for the evaluation of preoperative cardiac clearance at the request of Dr. Constance Haw.   History of Present Illness    Devin [name] was last examined by Dr. 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 He denied any associated chest pain or palpitations and was continued on his current medication regimen at that time.   He presented to [facility] ED on 04/06/2020 for evaluation of epigastric pain which had started around 0300. Reported associated pain radiating into his back and he had consumed green peppers the night before.  He reported nausea but denied any associated vomiting or diarrhea. Initial labs showed WBC 4.6, Hgb 15.3, platelets 152, Na+ 137, K+ 3.8 and creatinine 0.99. AST 20 and ALT 24. Lipase 37. Initial and delta HS Troponin values negative at 4. EKG showed NSR, HR 72 with known RBBB. CXR showed mild cardiomegaly with no acute findings. CT abdomen showed cholelithiasis with no acute abnormalities. He was noted to have aortic atherosclerosis and coronary atherosclerosis. Abdominal ultrasound was performed and showed gallstones with gallbladder wall thickening and distention with a positive sonographic Murphy sign which was consistent with cholecystitis.  He has been admitted for acute cholecystitis and started on IV Zosyn and antiemetics. Dr. Constance Haw is following  the patient and planning for cholecystectomy later today pending cardiac clearance.  In talking with the patient today, he reports having chronic discomfort along his sternal incision ever since CABG in 2016 but reports symptoms have overall been stable. They are typically worse with taking a deep breath and symptoms improve with coughing but are not associated with exertion or positional changes. He reports having baseline dyspnea on exertion in the setting of asbestos exposure and uses inhalers as needed with improvement in symptoms. He typically sleeps in a recliner on a nightly basis. Denies any specific palpitations, dizziness or presyncope. He does have chronic lower extremity edema and is listed as taking Torsemide 20 mg daily but says he frequently divides this out to 10 mg BID or TID due to frequent urination. He reports pain along his right upper quadrant for several days and symptoms typically improved if he applied pressure to the area. The day prior to admission he consumed [place] food for lunch and consumed peppers and onions for supper.  Developed worsening pain around 0300 the following morning and symptoms did not improve with Tums or Pepcid, therefore he came to ED for further evaluation. He is pain-free at this time but did experience intermittent nausea throughout the night.  Past Medical History:  Diagnosis Date  . Arthritis   . 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  . Essential hypertension   . GERD (gastroesophageal reflux disease)   . Melanoma of back ([place])   .  Prostatic hypertrophy   . Sleep apnea    Intolerant of CPAP    Past Surgical History:  Procedure Laterality Date  . ANTERIOR CERVICAL DECOMP/DISCECTOMY FUSION    . CARDIOVERSION N/A 01/28/2015   Procedure: CARDIOVERSION;  Surgeon: Arnoldo Lenis, MD;  Location: [facility];  Service: Endoscopy;  Laterality:  N/A;  . CORONARY ARTERY BYPASS GRAFT N/A 12/29/2014   Procedure: CORONARY ARTERY BYPASS GRAFTING (CABG), ON PUMP, TIMES FOUR, USING LEFT INTERNAL MAMMARY ARTERY, RIGHT GREATER SAPHENOUS VEIN HARVESTED ENDOSCOPICALLY;  Surgeon: Ivin Poot, MD;  Location: [facility];  Service: Open Heart Surgery;  Laterality: N/A;  . KNEE ARTHROSCOPY Right   . KNEE CARTILAGE SURGERY Left   . LEFT HEART CATHETERIZATION WITH CORONARY ANGIOGRAM N/A 12/28/2014   Procedure: LEFT HEART CATHETERIZATION WITH CORONARY ANGIOGRAM;  Surgeon: Lorretta Harp, MD;  Location: [facility] CATH LAB;  Service: Cardiovascular;  Laterality: N/A;  . MELANOMA EXCISION     "back"  . SHOULDER OPEN ROTATOR CUFF REPAIR Left   . TEE WITHOUT CARDIOVERSION N/A 12/29/2014   Procedure: TRANSESOPHAGEAL ECHOCARDIOGRAM (TEE);  Surgeon: Ivin Poot, MD;  Location: [facility];  Service: Open Heart Surgery;  Laterality: N/A;  . TEE WITHOUT CARDIOVERSION N/A 01/28/2015   Procedure: TRANSESOPHAGEAL ECHOCARDIOGRAM (TEE);  Surgeon: Arnoldo Lenis, MD;  Location: [facility];  Service: Endoscopy;  Laterality: N/A;     Home Medications:  Prior to Admission medications   Medication Sig Start Date End Date Taking? Authorizing Provider  aspirin 81 MG tablet Take 81 mg by mouth daily.   Yes [provider]  atorvastatin (LIPITOR) 10 MG tablet Take 10 mg by mouth daily.   Yes [provider]  clonazePAM (KLONOPIN) 0.5 MG tablet 0.5 mg at bedtime.  02/22/15  Yes [provider]  cyanocobalamin (,VITAMIN B-12,) 1000 MCG/ML injection INJECT 1ML AS DIRECTED EVERY 4 WEEKS 06/10/19  Yes [provider]  doxazosin (CARDURA) 4 MG tablet Take 1 tablet (4 mg total) by mouth 2 (two) times daily. 01/09/20  Yes Irine Seal, MD  PROAIR HFA 108 [phone] Base) MCG/ACT inhaler Inhale 2 puffs into the lungs every 4 (four) hours as needed.  09/19/19  Yes [provider]  testosterone cypionate (DEPOTESTOSTERONE CYPIONATE) 200 MG/ML injection INJECT 2 ML IM  EVERY 2 WEEKS 07/28/19  Yes [provider]  torsemide (DEMADEX) 20 MG tablet Take 20 mg by mouth daily.   Yes [provider]  cyanocobalamin 1000 MCG tablet Take 1,000 mcg by mouth daily.    [provider]    Inpatient Medications: Scheduled Meds: . aspirin  81 mg Oral Q breakfast  . atorvastatin  10 mg Oral Daily  . clonazePAM  0.5 mg Oral QHS  . doxazosin  4 mg Oral BID  . [START ON 04/08/2020] heparin  5,000 Units Subcutaneous Q8H  . metoprolol tartrate  25 mg Oral BID  . mupirocin ointment  1 application Nasal BID  . sodium chloride flush  3 mL Intravenous Q12H  . cyanocobalamin  1,000 mcg Oral Daily   Continuous Infusions: . sodium chloride    . sodium chloride 50 mL/hr at 04/06/20 2234  . cefoTEtan (CEFOTAN) IV    . piperacillin-tazobactam (ZOSYN)  IV 3.375 g (04/07/20 0556)   PRN Meds: sodium chloride, acetaminophen **OR** acetaminophen, albuterol, albuterol, fentaNYL (SUBLIMAZE) injection, ondansetron **OR** ondansetron (ZOFRAN) IV, oxyCODONE, polyethylene glycol, sodium chloride flush, traZODone  Allergies:    Allergies  Allergen Reactions  . Lasix [Furosemide] Nausea Only  Social History:   Social History   Socioeconomic History  . Marital status: Married    Spouse name: Not on file  . Number of children: 7  . Years of education: Not on file  . Highest education level: Not on file  Occupational History  . Not on file  Tobacco Use  . Smoking status: Never Smoker  . Smokeless tobacco: Never Used  Substance and Sexual Activity  . Alcohol use: Yes    Alcohol/week: 0.0 standard drinks    Comment: Occasional  . Drug use: No  . Sexual activity: Yes    Partners: Female  Other Topics Concern  . Not on file  Social History Narrative   Lives at home with wife.  They have seven children.     Social Determinants of Health   Financial Resource Strain:   . Difficulty of Paying Living Expenses:   Food Insecurity:   . Worried About  Charity fundraiser in the Last Year:   . Arboriculturist in the Last Year:   Transportation Needs:   . Film/video editor (Medical):   Marland Kitchen Lack of Transportation (Non-Medical):   Physical Activity:   . Days of Exercise per Week:   . Minutes of Exercise per Session:   Stress:   . Feeling of Stress :   Social Connections:   . Frequency of Communication with Friends and Family:   . Frequency of Social Gatherings with Friends and Family:   . Attends Religious Services:   . Active Member of Clubs or Organizations:   . Attends Archivist Meetings:   Marland Kitchen Marital Status:   Intimate Partner Violence:   . Fear of Current or Ex-Partner:   . Emotionally Abused:   Marland Kitchen Physically Abused:   . Sexually Abused:      Family History:    Family History  Problem Relation Age of Onset  . Stroke Mother   . Hypertension Mother   . Heart disease Sister        Died from complications of RHD  . Heart disease Brother        Heart failure related to EtOH and smoking  . Stomach cancer Sister       Review of Systems    General:  No chills, fever, night sweats or weight changes.  Cardiovascular:  No orthopnea, palpitations, paroxysmal nocturnal dyspnea. Positive for chest wall pain, dyspnea on exertion and edema.  Dermatological: No rash, lesions/masses Respiratory: No cough, dyspnea Urologic: No hematuria, dysuria Abdominal:   No vomiting, diarrhea, bright red blood per rectum, melena, or hematemesis. Positive for abdominal pain and nausea.  Neurologic:  No visual changes, wkns, changes in mental status. All other systems reviewed and are otherwise negative except as noted above.  Physical Exam/Data    Vitals:   04/06/20 1513 04/06/20 2127 04/07/20 0056 04/07/20 0429  BP: (!) 142/75 133/70 (!) 115/58 118/65  Pulse: 74 74 68 74  Resp: 16 20 20 20   Temp: 98.2 F (36.8 C) 98.7 F (37.1 C)  98.1 F (36.7 C)  TempSrc: Oral Oral  Oral  SpO2: 94% 96% 94% 93%  Weight:      Height:         Intake/Output Summary (Last 24 hours) at 04/07/2020 0834 Last data filed at 04/07/2020 0700 Gross per 24 hour  Intake 113.12 ml  Output 925 ml  Net -811.88 ml   Filed Weights   04/06/20 0515  Weight: 127 kg   Body  mass index is 37.97 kg/m.   General: Pleasant male appearing in NAD Psych: Normal affect. Neuro: Alert and oriented X 3. Moves all extremities spontaneously. HEENT: Normal  Neck: Supple without bruits or JVD. Lungs:  Resp regular and unlabored, scattered rhonchi along lung fields bilaterally. Heart: RRR no s3, s4, or murmurs. Abdomen: Soft, BS + x 4. Tender to palpation along RUQ.   Extremities: No clubbing or cyanosis. 1+ pitting edema bilaterally. DP/PT/Radials 2+ and equal bilaterally.   EKG:  The EKG was personally reviewed and demonstrates: NSR, HR 72 with known RBBB. Telemetry: Not connected to telemetry.   Labs/Studies     Relevant CV Studies:  Echocardiogram: 11/2018 IMPRESSIONS    1. The left ventricle has normal systolic function, with an ejection  fraction of 55-60%. The cavity size was normal. There is mildly increased  left ventricular wall thickness. Left ventricular diastolic parameters  were normal.  2. The right ventricle has low normal systolic function. The cavity was  mildly enlarged. There is no increase in right ventricular wall thickness.  3. The mitral valve is normal in structure. Moderate thickening of the  mitral valve leaflet. No evidence of mitral valve stenosis.  4. The tricuspid valve is normal in structure.  5. The aortic valve is tricuspid Moderate thickening of the aortic valve  no stenosis of the aortic valve.  6. The aortic root is normal in size and structure.  7. The inferior vena cava was dilated in size with >50% respiratory  variability.   Laboratory Data:  Chemistry Recent Labs  Lab 04/06/20 0520 04/07/20 0602  NA 137 136  K 3.8 4.1  CL 102 99  CO2 27 29  GLUCOSE 128* 113*  BUN 20 15    CREATININE 0.99 1.17  CALCIUM 8.8* 8.6*  GFRNONAA >60 >60  GFRAA >60 >60  ANIONGAP 8 8    Recent Labs  Lab 04/06/20 0520 04/07/20 0602  PROT 7.0 6.8  ALBUMIN 4.0 3.7  AST 20 16  ALT 24 25  ALKPHOS 46 49  BILITOT 0.5 1.1   Hematology Recent Labs  Lab 04/06/20 0520 04/07/20 0602  WBC 4.6 5.4  RBC 4.80 4.58  HGB 15.3 14.6  HCT 47.0 45.5  MCV 97.9 99.3  MCH 31.9 31.9  MCHC 32.6 32.1  RDW 14.6 14.9  PLT 152 148*   Cardiac EnzymesNo results for input(s): TROPONINI in the last 168 hours. No results for input(s): TROPIPOC in the last 168 hours.  BNPNo results for input(s): BNP, PROBNP in the last 168 hours.  DDimer No results for input(s): DDIMER in the last 168 hours.  Radiology/Studies:  DG Chest Portable 1 View  Result Date: 04/06/2020 CLINICAL DATA:  Abdominal/epigastric pain EXAM: PORTABLE CHEST 1 VIEW COMPARISON:  04/07/2015 FINDINGS: Cardiomegaly. CABG. There is no edema, consolidation, effusion, or pneumothorax. No acute osseous finding IMPRESSION: 1. No acute finding. 2. Mild cardiomegaly. Electronically Signed   By: [place] Fantasia M.D.   On: 04/06/2020 05:40   CT Angio Abd/Pel W and/or Wo Contrast  Result Date: 04/06/2020 CLINICAL DATA:  Epigastric and back pain since 3 a.m. EXAM: CTA ABDOMEN AND PELVIS WITHOUT AND WITH CONTRAST TECHNIQUE: Multidetector CT imaging of the abdomen and pelvis was performed using the standard protocol during bolus administration of intravenous contrast. Multiplanar reconstructed images and MIPs were obtained and reviewed to evaluate the vascular anatomy. CONTRAST:  177mL OMNIPAQUE IOHEXOL 350 MG/ML SOLN COMPARISON:  None. FINDINGS: VASCULAR Aorta: Diffuse atheromatous wall thickening with mixed density. No aneurysm  or dissection. Celiac: Mild atherosclerosis.  No acute finding or stenosis. SMA: Mild atherosclerosis.  No acute finding or proximal stenosis. Renals: 3 left renal arteries. No flow limiting stenosis, branch occlusion, or beading.  IMA: Patent Inflow: Atherosclerosis without stenosis or dissection. Proximal Outflow: Atherosclerosis. Veins: Negative in the arterial phase Review of the MIP images confirms the above findings. NON-VASCULAR Lower chest:  Extensive coronary atherosclerosis. Hepatobiliary: No focal liver abnormality.Cholelithiasis without evidence of cholecystitis. Pancreas: Unremarkable. Spleen: Unremarkable. Adrenals/Urinary Tract: Negative adrenals. No hydronephrosis or stone. Unremarkable bladder. Stomach/Bowel:  No obstruction. No appendicitis. Lymphatic: No mass or adenopathy. Reproductive:Enlarged prostate up lifting the bladder base. Other: No ascites or pneumoperitoneum. Musculoskeletal: No acute abnormalities. Advanced lumbar spine degeneration. IMPRESSION: VASCULAR No emergent finding. Aortic Atherosclerosis without flow limiting stenosis of major branch vessels (ICD10-I70.0). Coronary atherosclerosis. NON-VASCULAR 1. No acute finding. 2. Cholelithiasis. Electronically Signed   By: [place] Fantasia M.D.   On: 04/06/2020 07:33   US Abdomen Limited RUQ  Result Date: 04/06/2020 CLINICAL DATA:  Right upper quadrant pain for 10 hours EXAM: ULTRASOUND ABDOMEN LIMITED RIGHT UPPER QUADRANT COMPARISON:  CTA [facility] 04/06/2020 FINDINGS: Gallbladder: The gallbladder appears distended measuring 11 cm in length. There is gallbladder wall thickening measuring 5 mm. Multiple stones are identified which measure up to 8 mm. Positive sonographic Murphy's sign. Negative for pericholecystic fluid. Common bile duct: Diameter: 6.1 mm Liver: No focal lesion identified. Within normal limits in parenchymal echogenicity. Portal vein is patent on color Doppler imaging with normal direction of blood flow towards the liver. Other: None. IMPRESSION: 1. Gallstones, gallbladder wall thickening and distention. A positive sonographic Murphy sign was elicited by the ultrasound technologist. Imaging findings are compatible with cholecystitis. If further imaging  is clinically indicated consider nuclear medicine hepatic biliary scan to assess patency of the cystic duct. Electronically Signed   By: Kerby Moors M.D.   On: 04/06/2020 10:18     Assessment & Plan    1. Preoperative Cardiac Clearance for Laparoscopic Cholecystectomy - He has experienced chronic chest wall pain since prior CABG in 2016 but denies any change in this or exertional symptoms. He does have baseline dyspnea on exertion but is able to perform routine chores around the house without anginal symptoms. - HS Troponin values were negative on admission and his EKG shows a known right bundle branch block but no acute abnormalities when compared to prior tracings. An echocardiogram has been ordered and is pending at this time. I have reached out to the echocardiogram technologist to see if this can be performed as soon as possible given he is scheduled for surgery later today. - His RCRI risk is at 6.6% of a major cardiac event in the perioperative setting. Unless echocardiogram shows significant abnormalities as compared to prior tracing from 2020, he would likely be of intermediate risk to proceed and no further cardiac testing would be indicated at this time given no recent anginal symptoms. Will review further with Dr. Domenic Polite once echocardiogram results are available. Would follow on telemetry given his history of post-operative atrial flutter.   2. CAD - he is s/p CABG in 12/2014 with LIMA-LAD, SVG-OM1, SVG-OM2 and SVG-PDA. EKG without acute changes. HS Troponin values negative.  - continue current medication regimen with ASA 81mg  daily and Atorvastatin 10mg  daily. Appears he was started on Lopressor 25mg  BID by the admitting team (was not on BB therapy prior to admission). Can hold ASA if needed in the perioperative setting.   3. HTN - BP has been  at 113/54 -155/77 since admission. Continue current medication regimen.   4. HLD - followed by PCP as an outpatient. He has been continued  on Atorvastatin 10mg  daily.   5. Lower Extremity Edema - this has been chronic per his report. Prior echocardiogram in 11/2018 showed a preserved EF of 55 to 60% with mild LVH.  Repeat echocardiogram is pending. Torsemide is currently held given his NPO status.   6. Acute Cholecystitis - further management per Surgery and the Hospitalist team.    For questions or updates, please contact [place] Please consult [email or web address] for contact info under Cardiology/STEMI.  Signed, Erma Heritage, PA-C 04/07/2020, 8:34 AM Pager: [phone]   Attending note:  Patient seen and examined.  Case discussed with Ms. Ahmed Prima PA-C, I agree with her above findings.  Devin [name] is a patient of mine with a history of CAD status post CABG in 2016, chronic atypical thoracic discomfort since that time without clear-cut progressive angina and maintaining ADLs and outside chores without recent functional limitation.  He presents with acute cholecystitis and is scheduled to undergo laparoscopic cholecystectomy later this morning.  Preoperative cardiac consultation requested.  On examination this morning he appears comfortable, no active chest or abdominal discomfort.  Feels somewhat nauseated.  Is afebrile, heart rate 70s, blood pressure 118/65.  Lungs are clear, cardiac exam with RRR no gallop.  Include potassium 4.1, BUN 15, creatinine 1.17, AST 16, ALT 25, hemoglobin 14.6, platelets 148.  Chest x-ray reports no acute findings.  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  Bedside echocardiogram is pending at this time.  Patient is at overall intermediate perioperative cardiac risk with laparoscopic cholecystectomy under general anesthesia.  RCRI risk calculator 6.6% chance of major adverse cardiac event.  No further ischemic testing is suggested at this point.  Will review echocardiogram being performed, LVEF was 55 to 60% as  of February 2020.  Aspirin may be held temporarily if needed.  Would place him on telemetry in the short-term and follow-up ECG tomorrow.  Satira Sark, M.D., F.A.C.C.

## 2020-04-07 NOTE — Interval H&P Note (Signed)
History and Physical Interval Note:  04/07/2020 10:58 AM  Devin [name]  has presented today for surgery, with the diagnosis of acute cholecystitis.  The various methods of treatment have been discussed with the patient and family. After consideration of risks, benefits and other options for treatment, the patient has consented to  Procedure(s): LAPAROSCOPIC CHOLECYSTECTOMY (N/A) as a surgical intervention.  The patient's history has been reviewed, patient examined, no change in status, stable for surgery.  I have reviewed the patient's chart and labs.  Questions were answered to the patient's satisfaction.    Echo and ekg stable. Intermediate risk and no further workup needed. Recommended monitor post op. Overall doing well and no questions. COVID negative.  Virl Cagey

## 2020-04-07 NOTE — Op Note (Signed)
Operative Note   Preoperative Diagnosis: Acute cholecystitis   Postoperative Diagnosis: Same   Procedure(s) Performed: Laparoscopic cholecystectomy   Surgeon: Ria Comment C. Constance Haw, MD   Assistants: No qualified resident was available   Anesthesia: General endotracheal   Anesthesiologist: Denese Killings, MD    Specimens: Gallbladder    Estimated Blood Loss: Minimal    Blood Replacement: None    Complications: None    Operative Findings: Edematous distended gallbladder consistent with cholecystitis    Procedure: The patient was taken to the operating room and placed supine. General endotracheal anesthesia was induced. Intravenous antibiotics were administered per protocol. An orogastric tube positioned to decompress the stomach. The abdomen was prepared and draped in the usual sterile fashion.    A supraumbilical incision was made and a Veress technique was utilized to achieve pneumoperitoneum to 15 mmHg with carbon dioxide. A 11 mm optiview port was placed through the supraumbilical region, and a 10 mm 0-degree operative laparoscope was introduced. The area underlying the trocar and Veress needle were inspected and without evidence of injury.  Remaining trocars were placed under direct vision. Two 5 mm ports were placed in the right abdomen, between the anterior axillary and midclavicular line.  A final 11 mm port was placed through the mid-epigastrium, near the falciform ligament.   The gallbladder was distended and edematous. It was decompressed and bile was suctioned out with minimal spillage.   The gallbladder fundus was elevated cephalad and the infundibulum was retracted to the patient's right. The gallbladder/cystic duct junction was skeletonized. There was significant fat covering the junction, and a small vessel running up along the lateral edge of the gallbladder < 69mm in size. This was clipped to ensure it would not bleeding. The cystic artery noted in the triangle of Calot  and was also skeletonized.  We then continued liberal medial and lateral dissection until the critical view of safety was achieved.    The cystic duct was triply clipped and cystic artery was doubly clipped and both were divided. The gallbladder was then dissected from the liver bed with electrocautery. The specimen was placed in an Endopouch and was retrieved through the epigastric site.   Final inspection revealed acceptable hemostasis. Surgical SNOW was placed in the gallbladder bed.  Trocars were removed and pneumoperitoneum was released.  0 Vicryl fascial sutures were used to close the epigastric site and the umbilical port site was smaller than the tip of my finger. Skin incisions were closed with 4-0 Monocryl subcuticular sutures and Dermabond. The patient was awakened from anesthesia and extubated without complication.    Curlene Labrum, MD [facility] [address] [place], [place] [address] [phone] (office)

## 2020-04-07 NOTE — Progress Notes (Signed)
[facility]  Notified wife surgery completed. Plan for overnight stay for monitoring and EKG in AM per cards rec.  Hopefully home in [place], MD [facility] [address] [place], [place] [address] [phone] (office)

## 2020-04-07 NOTE — Anesthesia Postprocedure Evaluation (Signed)
Anesthesia Post Note  Patient: Devin [name]  Procedure(s) Performed: LAPAROSCOPIC CHOLECYSTECTOMY (N/A Abdomen)  Patient location during evaluation: PACU Anesthesia Type: General Level of consciousness: awake and alert and oriented Pain management: pain level controlled Vital Signs Assessment: post-procedure vital signs reviewed and stable Respiratory status: spontaneous breathing Cardiovascular status: blood pressure returned to baseline and stable Postop Assessment: no apparent nausea or vomiting Anesthetic complications: no     Last Vitals:  Vitals:   04/07/20 1313 04/07/20 1315  BP:  (!) 142/73  Pulse: 66 68  Resp: 14 13  Temp:    SpO2: 97% 94%    Last Pain:  Vitals:   04/07/20 1327  TempSrc:   PainSc: 3                  Siraj Dermody

## 2020-04-07 NOTE — Progress Notes (Signed)
[facility]  Made patient aware will need to stay overnight on telemetry and EKG in the AM per cardiology.  Curlene Labrum, MD [facility] [address] [place], [place] [address] [phone] (office)

## 2020-04-07 NOTE — Anesthesia Procedure Notes (Signed)
Procedure Name: Intubation Date/Time: 04/07/2020 11:28 AM Performed by: Ollen Bowl, CRNA Pre-anesthesia Checklist: Patient identified, Patient being monitored, Timeout performed, Emergency Drugs available and Suction available Patient Re-evaluated:Patient Re-evaluated prior to induction Oxygen Delivery Method: Circle system utilized Preoxygenation: Pre-oxygenation with 100% oxygen Induction Type: IV induction Ventilation: Mask ventilation without difficulty Laryngoscope Size: Glidescope (S4) Grade View: Grade I Tube type: Oral Tube size: 7.0 mm Number of attempts: 1 Airway Equipment and Method: Stylet Placement Confirmation: ETT inserted through vocal cords under direct vision,  positive ETCO2 and breath sounds checked- equal and bilateral Secured at: 24 cm Tube secured with: Tape Dental Injury: Teeth and Oropharynx as per pre-operative assessment

## 2020-04-07 NOTE — Progress Notes (Signed)
Patient Demographics:    Devin [name], is a 74 y.o. male, DOB - October 25, 1946, HDQ:[id number]  Admit date - 04/06/2020   Admitting Physician Makeya Hilgert Denton Brick, MD  Outpatient Primary MD for the patient is Devin Noble, MD  LOS - 1   Chief Complaint  Patient presents with   Chest Pain        Subjective:    Devin [name] today has no fevers, no emesis,  No chest pain,   --Some postop pain otherwise doing okay.  Assessment  & Plan :    Principal Problem:   Calculus of gallbladder with acute cholecystitis without obstruction Active Problems:   CAD (coronary artery disease), native coronary artery/H/o CABG x 4   Obstructive sleep apnea   Essential hypertension   Atrial fibrillation and flutter (HCC)   Chronic diastolic CHF (congestive heart failure) (HCC)   Hypogonadism in male   1) acute calculus cholecystitis---  -As needed IV opiates, IV antiemetics, and IV Zosyn -Status post lap chole by general surgeon Dr. Constance Haw on 04/07/2020   2)H/o CAD status post prior CABG in March 2016 --- status post 4 vessel bypass in March 2016--chest pain-free, no ACS type symptoms, continue aspirin and Lipitor,  metoprolol -Cardiology consult appreciated -Cardiology ordered echo due to persistent lower extremity edema  3)BPH with LUTs--- Cardura as prescribed  4) hypogonadism--PTA was on testosterone supplements  Disposition/Need for in-Hospital Stay- patient unable to be discharged at this time due to --lap chole--iv pain and iv nausea medications IV as tolerated possible discharge home when tolerating oral intake most likely 04/08/2020  Status is: Inpatient  Remains inpatient appropriate because:IV treatments appropriate due to intensity of illness or inability to take PO and Inpatient level of care appropriate due to severity of illness   Disposition: The patient is from: Home              Anticipated d/c is  to: Home              Anticipated d/c date is: 1 day              Patient currently is not medically stable to d/c. Barriers: Not Clinically Stable- -iv pain and iv nausea medications IV as tolerated possible discharge home when tolerating oral intake most likely 04/08/2020  Code Status : full  Family Communication: (patient is alert, awake and coherent)  -Discussed with his wife  Consults  :  Cardiology/gen surgery  DVT Prophylaxis  :    - Heparin - SCDs   Lab Results  Component Value Date   PLT 148 (L) 04/07/2020    Inpatient Medications  Scheduled Meds:  aspirin  81 mg Oral Q breakfast   atorvastatin  10 mg Oral Daily   clonazePAM  0.5 mg Oral QHS   docusate sodium  100 mg Oral BID   doxazosin  4 mg Oral BID   [START ON 04/08/2020] heparin  5,000 Units Subcutaneous Q8H   metoprolol tartrate  25 mg Oral BID   mupirocin ointment  1 application Nasal BID   sodium chloride flush  3 mL Intravenous Q12H   cyanocobalamin  1,000 mcg Oral Daily   Continuous Infusions:  sodium chloride     sodium chloride Stopped (04/07/20 1000)  PRN Meds:.sodium chloride, acetaminophen **OR** acetaminophen, albuterol, albuterol, morphine injection, ondansetron **OR** ondansetron (ZOFRAN) IV, oxyCODONE, polyethylene glycol, sodium chloride flush, traZODone    Anti-infectives (From admission, onward)   Start     Dose/Rate Route Frequency Ordered Stop   04/07/20 0600  cefoTEtan (CEFOTAN) 2 g in sodium chloride 0.9 % 100 mL IVPB     2 g 200 mL/hr over 30 Minutes Intravenous On call to O.R. 04/06/20 2240 04/07/20 1205   04/06/20 2045  piperacillin-tazobactam (ZOSYN) IVPB 3.375 g  Status:  Discontinued     3.375 g 12.5 mL/hr over 240 Minutes Intravenous Every 8 hours 04/06/20 2038 04/07/20 1358   04/06/20 1045  piperacillin-tazobactam (ZOSYN) IVPB 3.375 g     3.375 g 100 mL/hr over 30 Minutes Intravenous  Once 04/06/20 1030 04/06/20 1217        Objective:   Vitals:   04/07/20  1312 04/07/20 1313 04/07/20 1315 04/07/20 1332  BP:   (!) 142/73 120/68  Pulse:  66 68 61  Resp:  14 13 18   Temp:    97.8 F (36.6 C)  TempSrc:    Oral  SpO2: 91% 97% 94% 95%  Weight:      Height:        Wt Readings from Last 3 Encounters:  04/06/20 127 kg  12/16/19 133.4 kg  01/02/19 123.8 kg     Intake/Output Summary (Last 24 hours) at 04/07/2020 2104 Last data filed at 04/07/2020 1700 Gross per 24 hour  Intake 1643.12 ml  Output 345 ml  Net 1298.12 ml     Physical Exam  Gen:- Awake Alert,   HEENT:- [place].AT, No sclera icterus Neck-Supple Neck,No JVD,.  Lungs-  CTAB , fair symmetrical air movement CV- S1, S2 normal, regular , prior CABG scar Abd-  +ve B.Sounds, Abd Soft, appropriate postop tenderness Extremity/Skin:- No  edema, pedal pulses present \ Psych-affect is appropriate, oriented x3 Neuro-no new focal deficits, no tremors   Data Review:   Micro Results Recent Results (from the past 240 hour(s))  Surgical PCR screen     Status: None   Collection Time: 04/07/20  6:38 AM   Specimen: Nasal Mucosa; Nasal Swab  Result Value Ref Range Status   MRSA, PCR NEGATIVE NEGATIVE Final   Staphylococcus aureus NEGATIVE NEGATIVE Final    Comment: (NOTE) The Xpert SA Assay (FDA approved for NASAL specimens in patients 28 years of age and older), is one component of a comprehensive surveillance program. It is not intended to diagnose infection nor to guide or monitor treatment. Performed at [facility], [address], [place], [place] [address]   SARS Coronavirus 2 by RT PCR (hospital order, performed in [facility] hospital lab) Nasopharyngeal Nasopharyngeal Swab     Status: None   Collection Time: 04/07/20  9:07 AM   Specimen: Nasopharyngeal Swab  Result Value Ref Range Status   SARS Coronavirus 2 NEGATIVE NEGATIVE Final    Comment: (NOTE) SARS-CoV-2 target nucleic acids are NOT DETECTED. The SARS-CoV-2 RNA is generally detectable in upper and lower respiratory  specimens during the acute phase of infection. The lowest concentration of SARS-CoV-2 viral copies this assay can detect is 250 copies / mL. A negative result does not preclude SARS-CoV-2 infection and should not be used as the sole basis for treatment or other patient management decisions.  A negative result may occur with improper specimen collection / handling, submission of specimen other than nasopharyngeal swab, presence of viral mutation(s) within the areas targeted by this  assay, and inadequate number of viral copies (<250 copies / mL). A negative result must be combined with clinical observations, patient history, and epidemiological information. Fact Sheet for Patients:   StrictlyIdeas.no Fact Sheet for Healthcare Providers: BankingDealers.co.za This test is not yet approved or cleared  by the [place] FDA and has been authorized for detection and/or diagnosis of SARS-CoV-2 by FDA under an Emergency Use Authorization (EUA).  This EUA will remain in effect (meaning this test can be used) for the duration of the COVID-19 declaration under Section 564(b)(1) of the Act, 21 U.S.C. section 360bbb-3(b)(1), unless the authorization is terminated or revoked sooner. Performed at [facility], [address], [place], [place] [address]     Radiology Reports DG Chest Portable 1 View  Result Date: 04/06/2020 CLINICAL DATA:  Abdominal/epigastric pain EXAM: PORTABLE CHEST 1 VIEW COMPARISON:  04/07/2015 FINDINGS: Cardiomegaly. CABG. There is no edema, consolidation, effusion, or pneumothorax. No acute osseous finding IMPRESSION: 1. No acute finding. 2. Mild cardiomegaly. Electronically Signed   By: Monte Fantasia M.D.   On: 04/06/2020 05:40   ECHOCARDIOGRAM COMPLETE  Result Date: 04/07/2020    ECHOCARDIOGRAM REPORT   Patient Name:   CHAYSON CHARTERS Date of Exam: 04/07/2020 Medical Rec #:  [id number]     Height:       72.0 in Accession #:     [id number]    Weight:       280.0 lb Date of Birth:  10-13-1946     BSA:          2.458 m Patient Age:    41 years      BP:           118/65 mmHg Patient Gender: M             HR:           74 bpm. Exam Location:  [facility] Procedure: 2D Echo Indications:    CAD Native Vessel 414.01 / I25.10  History:        Patient has prior history of Echocardiogram examinations, most                 recent 12/20/2018. CAD, Prior CABG, Arrythmias:Atrial                 Fibrillation and Atrial Flutter; Risk Factors:Hypertension,                 Dyslipidemia and Non-Smoker.  Sonographer:    Leavy Cella RDCS (AE) Referring Phys: [phone] [place]  1. Left ventricular ejection fraction, by estimation, is 55 to 60%. The left ventricle has normal function. The left ventricle has no regional wall motion abnormalities. There is mild left ventricular hypertrophy. Left ventricular diastolic parameters are consistent with Grade II diastolic dysfunction (pseudonormalization). There is the interventricular septum is flattened in diastole ('D' shaped left ventricle), consistent with right ventricular volume overload.  2. Right ventricular systolic function is mildly to moderately reduced. The right ventricular size is moderately enlarged. There is normal pulmonary artery systolic pressure. The estimated right ventricular systolic pressure is 37.1 mmHg.  3. Left atrial size was mildly dilated.  4. Right atrial size was mildly dilated.  5. The mitral valve is abnormal, mildly thickened and calcified with noderate to severe annular calcification. Trivial mitral valve regurgitation.  6. The aortic valve is tricuspid. Aortic valve regurgitation is trivial.  7. The inferior vena cava is normal in size with greater than 50% respiratory variability, suggesting right  atrial pressure of 3 mmHg. FINDINGS  Left Ventricle: Left ventricular ejection fraction, by estimation, is 55 to 60%. The left ventricle has normal function. The  left ventricle has no regional wall motion abnormalities. The left ventricular internal cavity size was normal in size. There is  mild left ventricular hypertrophy. The interventricular septum is flattened in diastole ('D' shaped left ventricle), consistent with right ventricular volume overload. Left ventricular diastolic parameters are consistent with Grade II diastolic dysfunction (pseudonormalization). Right Ventricle: The right ventricular size is moderately enlarged. No increase in right ventricular wall thickness. Right ventricular systolic function is moderately reduced. There is normal pulmonary artery systolic pressure. The tricuspid regurgitant velocity is 2.59 m/s, and with an assumed right atrial pressure of 3 mmHg, the estimated right ventricular systolic pressure is 99.3 mmHg. Left Atrium: Left atrial size was mildly dilated. Right Atrium: Right atrial size was mildly dilated. Pericardium: There is no evidence of pericardial effusion. Mitral Valve: The mitral valve is abnormal. There is mild thickening of the mitral valve leaflet(s). There is mild calcification of the mitral valve leaflet(s). Moderate to severe mitral annular calcification. Trivial mitral valve regurgitation. Tricuspid Valve: The tricuspid valve is grossly normal. Tricuspid valve regurgitation is mild. Aortic Valve: The aortic valve is tricuspid. Aortic valve regurgitation is trivial. Moderate aortic valve annular calcification. Pulmonic Valve: The pulmonic valve was grossly normal. Pulmonic valve regurgitation is trivial. Aorta: The aortic root is normal in size and structure. Venous: The inferior vena cava is normal in size with greater than 50% respiratory variability, suggesting right atrial pressure of 3 mmHg. IAS/Shunts: No atrial level shunt detected by color flow Doppler.  LEFT VENTRICLE PLAX 2D LVIDd:         5.37 cm  Diastology LVIDs:         3.44 cm  LV e' lateral:   9.14 cm/s LV PW:         1.42 cm  LV E/e' lateral: 13.8  LV IVS:        1.13 cm  LV e' medial:    5.87 cm/s LVOT diam:     2.20 cm  LV E/e' medial:  21.5 LVOT Area:     3.80 cm  RIGHT VENTRICLE RV S prime:     8.24 cm/s TAPSE (M-mode): 1.5 cm LEFT ATRIUM             Index       RIGHT ATRIUM           Index LA diam:        4.60 cm 1.87 cm/m  RA Area:     20.90 cm LA Vol (A2C):   67.4 ml 27.42 ml/m RA Volume:   69.40 ml  28.23 ml/m LA Vol (A4C):   75.7 ml 30.80 ml/m LA Biplane Vol: 73.4 ml 29.86 ml/m   AORTA Ao Root diam: 3.40 cm MITRAL VALVE                TRICUSPID VALVE MV Area (PHT): 3.27 cm     TR Peak grad:   26.8 mmHg MV Decel Time: 232 msec     TR Vmax:        259.00 cm/s MV E velocity: 126.00 cm/s MV A velocity: 59.20 cm/s   SHUNTS MV E/A ratio:  2.13         Systemic Diam: 2.20 cm Rozann Lesches MD Electronically signed by Rozann Lesches MD Signature Date/Time: 04/07/2020/9:59:07 AM    Final    CT Angio Abd/Pel  W and/or Wo Contrast  Result Date: 04/06/2020 CLINICAL DATA:  Epigastric and back pain since 3 a.m. EXAM: CTA ABDOMEN AND PELVIS WITHOUT AND WITH CONTRAST TECHNIQUE: Multidetector CT imaging of the abdomen and pelvis was performed using the standard protocol during bolus administration of intravenous contrast. Multiplanar reconstructed images and MIPs were obtained and reviewed to evaluate the vascular anatomy. CONTRAST:  136mL OMNIPAQUE IOHEXOL 350 MG/ML SOLN COMPARISON:  None. FINDINGS: VASCULAR Aorta: Diffuse atheromatous wall thickening with mixed density. No aneurysm or dissection. Celiac: Mild atherosclerosis.  No acute finding or stenosis. SMA: Mild atherosclerosis.  No acute finding or proximal stenosis. Renals: 3 left renal arteries. No flow limiting stenosis, branch occlusion, or beading. IMA: Patent Inflow: Atherosclerosis without stenosis or dissection. Proximal Outflow: Atherosclerosis. Veins: Negative in the arterial phase Review of the MIP images confirms the above findings. NON-VASCULAR Lower chest:  Extensive coronary  atherosclerosis. Hepatobiliary: No focal liver abnormality.Cholelithiasis without evidence of cholecystitis. Pancreas: Unremarkable. Spleen: Unremarkable. Adrenals/Urinary Tract: Negative adrenals. No hydronephrosis or stone. Unremarkable bladder. Stomach/Bowel:  No obstruction. No appendicitis. Lymphatic: No mass or adenopathy. Reproductive:Enlarged prostate up lifting the bladder base. Other: No ascites or pneumoperitoneum. Musculoskeletal: No acute abnormalities. Advanced lumbar spine degeneration. IMPRESSION: VASCULAR No emergent finding. Aortic Atherosclerosis without flow limiting stenosis of major branch vessels (ICD10-I70.0). Coronary atherosclerosis. NON-VASCULAR 1. No acute finding. 2. Cholelithiasis. Electronically Signed   By: Monte Fantasia M.D.   On: 04/06/2020 07:33   US Abdomen Limited RUQ  Result Date: 04/06/2020 CLINICAL DATA:  Right upper quadrant pain for 10 hours EXAM: ULTRASOUND ABDOMEN LIMITED RIGHT UPPER QUADRANT COMPARISON:  CTA AP 04/06/2020 FINDINGS: Gallbladder: The gallbladder appears distended measuring 11 cm in length. There is gallbladder wall thickening measuring 5 mm. Multiple stones are identified which measure up to 8 mm. Positive sonographic Murphy's sign. Negative for pericholecystic fluid. Common bile duct: Diameter: 6.1 mm Liver: No focal lesion identified. Within normal limits in parenchymal echogenicity. Portal vein is patent on color Doppler imaging with normal direction of blood flow towards the liver. Other: None. IMPRESSION: 1. Gallstones, gallbladder wall thickening and distention. A positive sonographic Murphy sign was elicited by the ultrasound technologist. Imaging findings are compatible with cholecystitis. If further imaging is clinically indicated consider nuclear medicine hepatic biliary scan to assess patency of the cystic duct. Electronically Signed   By: Kerby Moors M.D.   On: 04/06/2020 10:18     CBC Recent Labs  Lab 04/06/20 0520 04/07/20 0602    WBC 4.6 5.4  HGB 15.3 14.6  HCT 47.0 45.5  PLT 152 148*  MCV 97.9 99.3  MCH 31.9 31.9  MCHC 32.6 32.1  RDW 14.6 14.9  LYMPHSABS 1.2  --   MONOABS 0.3  --   EOSABS 0.3  --   BASOSABS 0.0  --     Chemistries  Recent Labs  Lab 04/06/20 0520 04/07/20 0602  NA 137 136  K 3.8 4.1  CL 102 99  CO2 27 29  GLUCOSE 128* 113*  BUN 20 15  CREATININE 0.99 1.17  CALCIUM 8.8* 8.6*  AST 20 16  ALT 24 25  ALKPHOS 46 49  BILITOT 0.5 1.1   ------------------------------------------------------------------------------------------------------------------ No results for input(s): CHOL, HDL, LDLCALC, TRIG, CHOLHDL, LDLDIRECT in the last 72 hours.  Lab Results  Component Value Date   HGBA1C 5.6 01/23/2015   ------------------------------------------------------------------------------------------------------------------ No results for input(s): TSH, T4TOTAL, T3FREE, THYROIDAB in the last 72 hours.  Invalid input(s): FREET3 ------------------------------------------------------------------------------------------------------------------ No results for input(s): VITAMINB12, FOLATE, FERRITIN, TIBC,  IRON, RETICCTPCT in the last 72 hours.  Coagulation profile No results for input(s): INR, PROTIME in the last 168 hours.  No results for input(s): DDIMER in the last 72 hours.  Cardiac Enzymes No results for input(s): CKMB, TROPONINI, MYOGLOBIN in the last 168 hours.  Invalid input(s): CK ------------------------------------------------------------------------------------------------------------------ No results found for: BNP   Roxan Hockey M.D on 04/07/2020 at 9:04 PM  Go to [email or web address] - for contact info  [facility] - Office  [phone]

## 2020-04-08 ENCOUNTER — Encounter (HOSPITAL_COMMUNITY): Payer: Self-pay | Admitting: General Surgery

## 2020-04-08 DIAGNOSIS — I4891 Unspecified atrial fibrillation: Secondary | ICD-10-CM

## 2020-04-08 DIAGNOSIS — I4892 Unspecified atrial flutter: Secondary | ICD-10-CM

## 2020-04-08 LAB — COMPREHENSIVE METABOLIC PANEL
ALT: 43 U/L (ref 0–44)
AST: 35 U/L (ref 15–41)
Albumin: 3.8 g/dL (ref 3.5–5.0)
Alkaline Phosphatase: 48 U/L (ref 38–126)
Anion gap: 9 (ref 5–15)
BUN: 15 mg/dL (ref 8–23)
CO2: 29 mmol/L (ref 22–32)
Calcium: 8.3 mg/dL — ABNORMAL LOW (ref 8.9–10.3)
Chloride: 99 mmol/L (ref 98–111)
Creatinine, Ser: 1.21 mg/dL (ref 0.61–1.24)
GFR calc Af Amer: >60 mL/min (ref >60)
GFR calc non Af Amer: 59 mL/min — ABNORMAL LOW (ref >60)
Glucose, Bld: 116 mg/dL — ABNORMAL HIGH (ref 70–99)
Potassium: 4.1 mmol/L (ref 3.5–5.1)
Sodium: 137 mmol/L (ref 135–145)
Total Bilirubin: 1.1 mg/dL (ref 0.3–1.2)
Total Protein: 6.9 g/dL (ref 6.5–8.1)

## 2020-04-08 LAB — CBC
HCT: 46.2 % (ref 39.0–52.0)
Hemoglobin: 14.5 g/dL (ref 13.0–17.0)
MCH: 31.8 pg (ref 26.0–34.0)
MCHC: 31.4 g/dL (ref 30.0–36.0)
MCV: 101.3 fL — ABNORMAL HIGH (ref 80.0–100.0)
Platelets: 152 10*3/uL (ref 150–400)
RBC: 4.56 MIL/uL (ref 4.22–5.81)
RDW: 15.1 % (ref 11.5–15.5)
WBC: 6 10*3/uL (ref 4.0–10.5)
nRBC: 0 % (ref 0.0–0.2)

## 2020-04-08 LAB — SURGICAL PATHOLOGY

## 2020-04-08 MED ORDER — SENNOSIDES-DOCUSATE SODIUM 8.6-50 MG PO TABS: 2.0000 | ORAL_TABLET | Freq: Every day | ORAL | 5 refills | Status: AC

## 2020-04-08 MED ORDER — PROAIR HFA 108 (90 BASE) MCG/ACT IN AERS: 2.0000 | INHALATION_SPRAY | RESPIRATORY_TRACT | 1 refills | Status: DC | PRN

## 2020-04-08 MED ORDER — ONDANSETRON HCL 4 MG PO TABS: 4.0000 mg | ORAL_TABLET | Freq: Four times a day (QID) | ORAL | 0 refills | Status: DC | PRN

## 2020-04-08 MED ORDER — ASPIRIN 81 MG PO TABS: 81.0000 mg | ORAL_TABLET | Freq: Every day | ORAL | 5 refills | Status: AC

## 2020-04-08 MED ORDER — INCRUSE ELLIPTA 62.5 MCG/INH IN AEPB: 1.0000 | INHALATION_SPRAY | Freq: Every day | RESPIRATORY_TRACT | 2 refills | Status: DC

## 2020-04-08 MED ORDER — ACETAMINOPHEN 325 MG PO TABS: 650.0000 mg | ORAL_TABLET | Freq: Four times a day (QID) | ORAL | 0 refills | Status: DC | PRN

## 2020-04-08 MED ORDER — OXYCODONE HCL 5 MG PO TABS: 5.0000 mg | ORAL_TABLET | ORAL | 0 refills | Status: DC | PRN

## 2020-04-08 MED ORDER — METOPROLOL SUCCINATE ER 25 MG PO TB24
25.0000 mg | ORAL_TABLET | Freq: Every day | ORAL | 11 refills | Status: AC
Start: 2020-04-08 — End: 2024-06-26

## 2020-04-08 NOTE — Plan of Care (Signed)

## 2020-04-08 NOTE — Progress Notes (Signed)
[facility]  Doing well this Am. Pain controlled. Ready to go. Cardiology has seen and no chest pain and EKG ok.  Will do post op phone call 6/29.  Call if issues.   Curlene Labrum, MD [facility] [address] [place], [place] [phone] (office)

## 2020-04-08 NOTE — Progress Notes (Signed)
Subjective:  Denies SSCP, palpitations or Dyspnea Taking PO has not had BM  Objective:  Vitals:   04/07/20 1313 04/07/20 1315 04/07/20 1332 04/07/20 2105  BP:  (!) 142/73 120/68 135/61  Pulse: 66 68 61 81  Resp: 14 13 18 20   Temp:   97.8 F (36.6 C) 98.6 F (37 C)  TempSrc:   Oral Oral  SpO2: 97% 94% 95% 97%  Weight:      Height:        Intake/Output from previous day:  Intake/Output Summary (Last 24 hours) at 04/08/2020 0829 Last data filed at 04/08/2020 0300 Gross per 24 hour  Intake 1820 ml  Output 20 ml  Net 1800 ml    Physical Exam: Affect appropriate Healthy:  appears stated age HEENT: normal Neck supple with no adenopathy JVP normal no bruits no thyromegaly Lungs clear with no wheezing and good diaphragmatic motion Heart:  S1/S2 no murmur, no rub, gallop or click PMI normal post sternotomy  Abdomen: benighn, BS positve, post lap choly Distal pulses intact with no bruits Trace edema Neuro non-focal Skin warm and dry No muscular weakness   Lab Results: Basic Metabolic Panel: Recent Labs    04/06/20 0520 04/07/20 0602  NA 137 136  K 3.8 4.1  CL 102 99  CO2 27 29  GLUCOSE 128* 113*  BUN 20 15  CREATININE 0.99 1.17  CALCIUM 8.8* 8.6*   Liver Function Tests: Recent Labs    04/06/20 0520 04/07/20 0602  AST 20 16  ALT 24 25  ALKPHOS 46 49  BILITOT 0.5 1.1  PROT 7.0 6.8  ALBUMIN 4.0 3.7   Recent Labs    04/06/20 0520  LIPASE 37   CBC: Recent Labs    04/06/20 0520 04/07/20 0602  WBC 4.6 5.4  NEUTROABS 2.8  --   HGB 15.3 14.6  HCT 47.0 45.5  MCV 97.9 99.3  PLT 152 148*    Imaging: ECHOCARDIOGRAM COMPLETE  Result Date: 04/07/2020    ECHOCARDIOGRAM REPORT   Patient Name:   Devin [name] Date of Exam: 04/07/2020 Medical Rec #:  [id number]     Height:       72.0 in Accession #:    [id number]    Weight:       280.0 lb Date of Birth:  Apr 20, 1946     BSA:          2.458 m Patient Age:    74 years      BP:           118/65 mmHg Patient  Gender: M             HR:           74 bpm. Exam Location:  [facility] Procedure: 2D Echo Indications:    CAD Native Vessel 414.01 / I25.10  History:        Patient has prior history of Echocardiogram examinations, most                 recent 12/20/2018. CAD, Prior CABG, Arrythmias:Atrial                 Fibrillation and Atrial Flutter; Risk Factors:Hypertension,                 Dyslipidemia and Non-Smoker.  Sonographer:    Leavy Cella RDCS (AE) Referring Phys: [id number] [place]  1. Left ventricular ejection fraction, by estimation, is 55 to 60%. The left ventricle has  normal function. The left ventricle has no regional wall motion abnormalities. There is mild left ventricular hypertrophy. Left ventricular diastolic parameters are consistent with Grade II diastolic dysfunction (pseudonormalization). There is the interventricular septum is flattened in diastole ('D' shaped left ventricle), consistent with right ventricular volume overload.  2. Right ventricular systolic function is mildly to moderately reduced. The right ventricular size is moderately enlarged. There is normal pulmonary artery systolic pressure. The estimated right ventricular systolic pressure is 03.5 mmHg.  3. Left atrial size was mildly dilated.  4. Right atrial size was mildly dilated.  5. The mitral valve is abnormal, mildly thickened and calcified with noderate to severe annular calcification. Trivial mitral valve regurgitation.  6. The aortic valve is tricuspid. Aortic valve regurgitation is trivial.  7. The inferior vena cava is normal in size with greater than 50% respiratory variability, suggesting right atrial pressure of 3 mmHg. FINDINGS  Left Ventricle: Left ventricular ejection fraction, by estimation, is 55 to 60%. The left ventricle has normal function. The left ventricle has no regional wall motion abnormalities. The left ventricular internal cavity size was normal in size. There is  mild left ventricular  hypertrophy. The interventricular septum is flattened in diastole ('D' shaped left ventricle), consistent with right ventricular volume overload. Left ventricular diastolic parameters are consistent with Grade II diastolic dysfunction (pseudonormalization). Right Ventricle: The right ventricular size is moderately enlarged. No increase in right ventricular wall thickness. Right ventricular systolic function is moderately reduced. There is normal pulmonary artery systolic pressure. The tricuspid regurgitant velocity is 2.59 m/s, and with an assumed right atrial pressure of 3 mmHg, the estimated right ventricular systolic pressure is 00.9 mmHg. Left Atrium: Left atrial size was mildly dilated. Right Atrium: Right atrial size was mildly dilated. Pericardium: There is no evidence of pericardial effusion. Mitral Valve: The mitral valve is abnormal. There is mild thickening of the mitral valve leaflet(s). There is mild calcification of the mitral valve leaflet(s). Moderate to severe mitral annular calcification. Trivial mitral valve regurgitation. Tricuspid Valve: The tricuspid valve is grossly normal. Tricuspid valve regurgitation is mild. Aortic Valve: The aortic valve is tricuspid. Aortic valve regurgitation is trivial. Moderate aortic valve annular calcification. Pulmonic Valve: The pulmonic valve was grossly normal. Pulmonic valve regurgitation is trivial. Aorta: The aortic root is normal in size and structure. Venous: The inferior vena cava is normal in size with greater than 50% respiratory variability, suggesting right atrial pressure of 3 mmHg. IAS/Shunts: No atrial level shunt detected by color flow Doppler.  LEFT VENTRICLE PLAX 2D LVIDd:         5.37 cm  Diastology LVIDs:         3.44 cm  LV e' lateral:   9.14 cm/s LV PW:         1.42 cm  LV E/e' lateral: 13.8 LV IVS:        1.13 cm  LV e' medial:    5.87 cm/s LVOT diam:     2.20 cm  LV E/e' medial:  21.5 LVOT Area:     3.80 cm  RIGHT VENTRICLE RV S prime:      8.24 cm/s TAPSE (M-mode): 1.5 cm LEFT ATRIUM             Index       RIGHT ATRIUM           Index LA diam:        4.60 cm 1.87 cm/m  RA Area:     20.90 cm LA  Vol [facility]):   67.4 ml 27.42 ml/m RA Volume:   69.40 ml  28.23 ml/m LA Vol (A4C):   75.7 ml 30.80 ml/m LA Biplane Vol: 73.4 ml 29.86 ml/m   AORTA Ao Root diam: 3.40 cm MITRAL VALVE                TRICUSPID VALVE MV Area (PHT): 3.27 cm     TR Peak grad:   26.8 mmHg MV Decel Time: 232 msec     TR Vmax:        259.00 cm/s MV E velocity: 126.00 cm/s MV A velocity: 59.20 cm/s   SHUNTS MV E/A ratio:  2.13         Systemic Diam: 2.20 cm Rozann Lesches MD Electronically signed by Rozann Lesches MD Signature Date/Time: 04/07/2020/9:59:07 AM    Final    US Abdomen Limited RUQ  Result Date: 04/06/2020 CLINICAL DATA:  Right upper quadrant pain for 10 hours EXAM: ULTRASOUND ABDOMEN LIMITED RIGHT UPPER QUADRANT COMPARISON:  CTA AP 04/06/2020 FINDINGS: Gallbladder: The gallbladder appears distended measuring 11 cm in length. There is gallbladder wall thickening measuring 5 mm. Multiple stones are identified which measure up to 8 mm. Positive sonographic Murphy's sign. Negative for pericholecystic fluid. Common bile duct: Diameter: 6.1 mm Liver: No focal lesion identified. Within normal limits in parenchymal echogenicity. Portal vein is patent on color Doppler imaging with normal direction of blood flow towards the liver. Other: None. IMPRESSION: 1. Gallstones, gallbladder wall thickening and distention. A positive sonographic Murphy sign was elicited by the ultrasound technologist. Imaging findings are compatible with cholecystitis. If further imaging is clinically indicated consider nuclear medicine hepatic biliary scan to assess patency of the cystic duct. Electronically Signed   By: Kerby Moors M.D.   On: 04/06/2020 10:18    Cardiac Studies:  ECG: cannot pul up from Epic due to upgrade   Telemetry:  NSR   Echo: EF normal no significant valve disease    Medications:   . aspirin  81 mg Oral Q breakfast  . atorvastatin  10 mg Oral Daily  . clonazePAM  0.5 mg Oral QHS  . docusate sodium  100 mg Oral BID  . doxazosin  4 mg Oral BID  . heparin  5,000 Units Subcutaneous Q8H  . metoprolol tartrate  25 mg Oral BID  . mupirocin ointment  1 application Nasal BID  . sodium chloride flush  3 mL Intravenous Q12H  . cyanocobalamin  1,000 mcg Oral Daily     . sodium chloride    . sodium chloride Stopped (04/07/20 1000)    Assessment/Plan:   1. CAD:  No angina continue ASA beta blocker and ASA CABG in 2016 2. HLD:  On statin continue 3. PAF:  Distant history no arrhythmia post op  4. GB:  Post surgical removal possible d/c once he has BM not taking any pain meds   Will arrange outpatient f/u with Dr Leta Speller [facility] 04/08/2020, 8:29 AM

## 2020-04-08 NOTE — Discharge Summary (Signed)
Devin [name], is a 74 y.o. male  DOB 24-Sep-1946  MRN [id number].  Admission date:  04/06/2020  Admitting Physician  Otillia Cordone Denton Brick, MD  Discharge Date:  04/08/2020   Primary MD  [name], MD  Recommendations for primary care physician for things to follow:   1)Avoid ibuprofen/Advil/Aleve/Motrin/Goody Powders/Naproxen/BC powders/Meloxicam/Diclofenac/Indomethacin and other Nonsteroidal anti-inflammatory medications as these will make you more likely to bleed and can cause stomach ulcers, can also cause Kidney problems.   2) avoid lifting more than 20 pounds for the next 6 weeks  3) post operative/postsurgical follow-up as advised by general surgeon  4) please note that there has been some changes to your medications, metoprolol/Toprol-XL has been added for your heart  5) you have refused further evaluation for your sleep apnea and possible CPAP machine--- your primary care physician can refer you to a pulmonologist Dr. Halford Chessman here in [place] if you change your mind  Admission Diagnosis  Gallstone [K80.20] Calculus of gallbladder and bile duct with acute cholecystitis [K80.62]   Discharge Diagnosis  Gallstone [K80.20] Calculus of gallbladder and bile duct with acute cholecystitis [K80.62]   Principal Problem:   Calculus of gallbladder with acute cholecystitis without obstruction Active Problems:   CAD (coronary artery disease), native coronary artery/H/o CABG x 4   Obstructive sleep apnea--- refuses CPAP   Essential hypertension   Atrial fibrillation and flutter (HCC)   Chronic diastolic CHF (congestive heart failure) /HFpEF   Hypogonadism in male      Past Medical History:  Diagnosis Date  . Arthritis   . Asbestosis ([place])   . 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  . Essential hypertension   . GERD  (gastroesophageal reflux disease)   . Melanoma of back ([place])   . Prostatic hypertrophy   . Sleep apnea    Intolerant of CPAP    Past Surgical History:  Procedure Laterality Date  . ANTERIOR CERVICAL DECOMP/DISCECTOMY FUSION    . CARDIOVERSION N/A 01/28/2015   Procedure: CARDIOVERSION;  Surgeon: Arnoldo Lenis, MD;  Location: [facility];  Service: Endoscopy;  Laterality: N/A;  . CHOLECYSTECTOMY N/A 04/07/2020   Procedure: LAPAROSCOPIC CHOLECYSTECTOMY;  Surgeon: Virl Cagey, MD;  Location: [facility];  Service: General;  Laterality: N/A;  . CORONARY ARTERY BYPASS GRAFT N/A 12/29/2014   Procedure: CORONARY ARTERY BYPASS GRAFTING (CABG), ON PUMP, TIMES FOUR, USING LEFT INTERNAL MAMMARY ARTERY, RIGHT GREATER SAPHENOUS VEIN HARVESTED ENDOSCOPICALLY;  Surgeon: Ivin Poot, MD;  Location: [facility];  Service: Open Heart Surgery;  Laterality: N/A;  . KNEE ARTHROSCOPY Right   . KNEE CARTILAGE SURGERY Left   . LEFT HEART CATHETERIZATION WITH CORONARY ANGIOGRAM N/A 12/28/2014   Procedure: LEFT HEART CATHETERIZATION WITH CORONARY ANGIOGRAM;  Surgeon: Lorretta Harp, MD;  Location: [facility];  Service: Cardiovascular;  Laterality: N/A;  . MELANOMA EXCISION     "back"  . SHOULDER OPEN ROTATOR CUFF REPAIR  Left   . TEE WITHOUT CARDIOVERSION N/A 12/29/2014   Procedure: TRANSESOPHAGEAL ECHOCARDIOGRAM (TEE);  Surgeon: Ivin Poot, MD;  Location: [facility];  Service: Open Heart Surgery;  Laterality: N/A;  . TEE WITHOUT CARDIOVERSION N/A 01/28/2015   Procedure: TRANSESOPHAGEAL ECHOCARDIOGRAM (TEE);  Surgeon: Arnoldo Lenis, MD;  Location: [facility];  Service: Endoscopy;  Laterality: N/A;     HPI  from the history and physical done on the day of admission:    Devin [name]  is a 74 y.o. male with past medical history relevant for obesity and OSA but noncompliant with CPAP, history of CAD status post four-vessel CABG March 2016, BPH and hypertension who presents to the ED with epigastric pain and nausea  - No  fever  Or chills  -Additional history obtained from patient's wife at bedside -Patient had persistent epigastric pain that radiated to his back starting around 3 AM -Urinary symptoms no hematuria no emesis -CBC WNL with a white count of 4.4 and hemoglobin of 15.3 and platelet count of 152 -The unremarkable except for random glucose of 128, LFTs are not elevated and creatinine 0.9 -Lipase was WNL at 37 -Troponin x2 neg -Imaging studies in the ED -CT abdomen and pelvis with gallstones but without acute findings abdominal ultrasound consistent with acute cholecystitis with positive Murphy sign  -Chest x-ray without acute findings -General surgeon consulted by EDP, general surgeon requested for this admission due to CAD and other comorbid conditions    Hospital Course:     -1)acute calculus cholecystitis--- --Treated with as needed IV opiates, IV antiemetics, and IV Zosyn -Status post lap chole by general surgeon Dr. Constance Haw on 04/07/2020 -Currently tolerating oral intake well, had BM, as per general surgeon okay to discharge   2)H/o CAD status post prior CABG in March 2016 ---status post 4 vessel bypass in March 2016--chest pain-free, no ACS type symptoms, continue aspirin and Lipitor,  metoprolol -Cardiology consult appreciated  3)BPH with LUTs---Cardura as prescribed  4)hypogonadism--PTA was on testosterone supplements  5) obesity and OSA--- patient has not been compliant with CPAP for many years, patient declines referral to pulmonology for further evaluation and CPAP set up  6)HFpEF--patient with chronic diastolic dysfunction CHF with chronic lower extremity edema -I suspect that untreated OSA may be contributing to right-sided heart failure and persistent lower extremity edema =-Echo from 04/07/2020 with EF of 55 to 60% and grade 2 diastolic dysfunction--with evidence of right ventricular volume overload -Attempted to increase torsemide dosage patient declines today he  says it makes him urinate too frequently  --patient only wants to take torsemide 20 mg daily he does not want to escalate the doses of his diuretics -- patient continues to decline CPAP use  7) remote history of PAF--- no significant arrhythmia on telemetry this admission  Disposition--discharge home   Disposition: The patient is from: Home  Anticipated d/c is to: Home  Anticipated d/c date is: 1 day   Code Status : full  Family Communication: (patient is alert, awake and coherent)  -Discussed with his wife  Consults  :  Cardiology/gen surgery   Discharge Condition: Stable  Follow UP   Follow-up Information    Virl Cagey, MD On 04/27/2020.   Specialty: General Surgery Why: post operative phone, call if you need to be seen in person Contact information: [id number] [name] [id number] [phone]                Diet and Activity recommendation:  As advised  Discharge Instructions    Discharge Instructions    Call MD for:  difficulty breathing, headache or visual disturbances   Complete by: As directed    Call MD for:  persistant dizziness or light-headedness   Complete by: As directed    Call MD for:  persistant nausea and vomiting   Complete by: As directed    Call MD for:  severe uncontrolled pain   Complete by: As directed    Call MD for:  temperature >100.4   Complete by: As directed    Diet - low sodium heart healthy   Complete by: As directed    Discharge instructions   Complete by: As directed    1)Avoid ibuprofen/Advil/Aleve/Motrin/Goody Powders/Naproxen/BC powders/Meloxicam/Diclofenac/Indomethacin and other Nonsteroidal anti-inflammatory medications as these will make you more likely to bleed and can cause stomach ulcers, can also cause Kidney problems.   2) avoid lifting more than 20 pounds for the next 6 weeks  3) post operative/postsurgical follow-up as advised by general  surgeon  4) please note that there has been some changes to your medications, metoprolol/Toprol-XL has been added for your heart  5) you have refused further evaluation for your sleep apnea and possible CPAP machine--- your primary care physician can refer you to a pulmonologist Dr. Halford Chessman here in [place] if you change your mind   Discharge wound care:   Complete by: As directed    Postop wound care as advised by your general surgeon   Increase activity slowly   Complete by: As directed    Avoid lifting more than 20 pounds in the next 6 weeks        Discharge Medications     Allergies as of 04/08/2020      Reactions   Lasix [furosemide] Nausea Only      Medication List    TAKE these medications   acetaminophen 325 MG tablet Commonly known as: TYLENOL Take 2 tablets (650 mg total) by mouth every 6 (six) hours as needed for mild pain (or Fever >/= 101).   aspirin 81 MG tablet Take 1 tablet (81 mg total) by mouth daily with breakfast. For Heart What changed:   when to take this  additional instructions   atorvastatin 10 MG tablet Commonly known as: LIPITOR Take 10 mg by mouth daily.   clonazePAM 0.5 MG tablet Commonly known as: KLONOPIN 0.5 mg at bedtime.   cyanocobalamin 1000 MCG tablet Take 1,000 mcg by mouth daily.   cyanocobalamin 1000 MCG/ML injection Commonly known as: (VITAMIN B-12) INJECT 1ML AS DIRECTED EVERY 4 WEEKS   doxazosin 4 MG tablet Commonly known as: CARDURA Take 1 tablet (4 mg total) by mouth 2 (two) times daily.   Incruse Ellipta 62.5 MCG/INH Aepb Generic drug: umeclidinium bromide Inhale 1 puff into the lungs daily.   metoprolol succinate 25 MG 24 hr tablet Commonly known as: Toprol XL Take 1 tablet (25 mg total) by mouth daily. For Heart   ondansetron 4 MG tablet Commonly known as: ZOFRAN Take 1 tablet (4 mg total) by mouth every 6 (six) hours as needed for nausea.   oxyCODONE 5 MG immediate release tablet Commonly known as: Oxy  IR/ROXICODONE Take 1 tablet (5 mg total) by mouth every 4 (four) hours as needed for severe pain or breakthrough pain.   ProAir HFA 108 (90 Base) MCG/ACT inhaler Generic drug: albuterol Inhale 2 puffs into the lungs every 4 (four) hours as needed for wheezing or shortness of breath. What changed: reasons to take this  senna-docusate 8.6-50 MG tablet Commonly known as: Senokot-S Take 2 tablets by mouth at bedtime. For bowels   testosterone cypionate 200 MG/ML injection Commonly known as: DEPOTESTOSTERONE CYPIONATE INJECT 2 ML IM EVERY 2 WEEKS   torsemide 20 MG tablet Commonly known as: DEMADEX Take 20 mg by mouth daily.            Discharge Care Instructions  (From admission, onward)         Start     Ordered   04/08/20 0000  Discharge wound care:       Comments: Postop wound care as advised by your general surgeon   04/08/20 1110          Major procedures and Radiology Reports - PLEASE review detailed and final reports for all details, in brief -    DG Chest Portable 1 View  Result Date: 04/06/2020 CLINICAL DATA:  Abdominal/epigastric pain EXAM: PORTABLE CHEST 1 VIEW COMPARISON:  04/07/2015 FINDINGS: Cardiomegaly. CABG. There is no edema, consolidation, effusion, or pneumothorax. No acute osseous finding IMPRESSION: 1. No acute finding. 2. Mild cardiomegaly. Electronically Signed   By: Monte Fantasia M.D.   On: 04/06/2020 05:40   ECHOCARDIOGRAM COMPLETE  Result Date: 04/07/2020    ECHOCARDIOGRAM REPORT   Patient Name:   ARUL FARABEE Date of Exam: 04/07/2020 Medical Rec #:  [id number]     Height:       72.0 in Accession #:    [id number]    Weight:       280.0 lb Date of Birth:  03-Dec-1945     BSA:          2.458 m Patient Age:    39 years      BP:           118/65 mmHg Patient Gender: M             HR:           74 bpm. Exam Location:  [facility] Procedure: 2D Echo Indications:    CAD Native Vessel 414.01 / I25.10  History:        Patient has prior history of  Echocardiogram examinations, most                 recent 12/20/2018. CAD, Prior CABG, Arrythmias:Atrial                 Fibrillation and Atrial Flutter; Risk Factors:Hypertension,                 Dyslipidemia and Non-Smoker.  Sonographer:    Leavy Cella RDCS (AE) Referring Phys: [phone] [place]  1. Left ventricular ejection fraction, by estimation, is 55 to 60%. The left ventricle has normal function. The left ventricle has no regional wall motion abnormalities. There is mild left ventricular hypertrophy. Left ventricular diastolic parameters are consistent with Grade II diastolic dysfunction (pseudonormalization). There is the interventricular septum is flattened in diastole ('D' shaped left ventricle), consistent with right ventricular volume overload.  2. Right ventricular systolic function is mildly to moderately reduced. The right ventricular size is moderately enlarged. There is normal pulmonary artery systolic pressure. The estimated right ventricular systolic pressure is 41.9 mmHg.  3. Left atrial size was mildly dilated.  4. Right atrial size was mildly dilated.  5. The mitral valve is abnormal, mildly thickened and calcified with noderate to severe annular calcification. Trivial mitral valve regurgitation.  6. The aortic valve is tricuspid. Aortic valve regurgitation is trivial.  7. The inferior vena cava is normal in size with greater than 50% respiratory variability, suggesting right atrial pressure of 3 mmHg. FINDINGS  Left Ventricle: Left ventricular ejection fraction, by estimation, is 55 to 60%. The left ventricle has normal function. The left ventricle has no regional wall motion abnormalities. The left ventricular internal cavity size was normal in size. There is  mild left ventricular hypertrophy. The interventricular septum is flattened in diastole ('D' shaped left ventricle), consistent with right ventricular volume overload. Left ventricular diastolic parameters are  consistent with Grade II diastolic dysfunction (pseudonormalization). Right Ventricle: The right ventricular size is moderately enlarged. No increase in right ventricular wall thickness. Right ventricular systolic function is moderately reduced. There is normal pulmonary artery systolic pressure. The tricuspid regurgitant velocity is 2.59 m/s, and with an assumed right atrial pressure of 3 mmHg, the estimated right ventricular systolic pressure is 63.8 mmHg. Left Atrium: Left atrial size was mildly dilated. Right Atrium: Right atrial size was mildly dilated. Pericardium: There is no evidence of pericardial effusion. Mitral Valve: The mitral valve is abnormal. There is mild thickening of the mitral valve leaflet(s). There is mild calcification of the mitral valve leaflet(s). Moderate to severe mitral annular calcification. Trivial mitral valve regurgitation. Tricuspid Valve: The tricuspid valve is grossly normal. Tricuspid valve regurgitation is mild. Aortic Valve: The aortic valve is tricuspid. Aortic valve regurgitation is trivial. Moderate aortic valve annular calcification. Pulmonic Valve: The pulmonic valve was grossly normal. Pulmonic valve regurgitation is trivial. Aorta: The aortic root is normal in size and structure. Venous: The inferior vena cava is normal in size with greater than 50% respiratory variability, suggesting right atrial pressure of 3 mmHg. IAS/Shunts: No atrial level shunt detected by color flow Doppler.  LEFT VENTRICLE PLAX 2D LVIDd:         5.37 cm  Diastology LVIDs:         3.44 cm  LV e' lateral:   9.14 cm/s LV PW:         1.42 cm  LV E/e' lateral: 13.8 LV IVS:        1.13 cm  LV e' medial:    5.87 cm/s LVOT diam:     2.20 cm  LV E/e' medial:  21.5 LVOT Area:     3.80 cm  RIGHT VENTRICLE RV S prime:     8.24 cm/s TAPSE (M-mode): 1.5 cm LEFT ATRIUM             Index       RIGHT ATRIUM           Index LA diam:        4.60 cm 1.87 cm/m  RA Area:     20.90 cm LA Vol (A2C):   67.4 ml 27.42  ml/m RA Volume:   69.40 ml  28.23 ml/m LA Vol (A4C):   75.7 ml 30.80 ml/m LA Biplane Vol: 73.4 ml 29.86 ml/m   AORTA Ao Root diam: 3.40 cm MITRAL VALVE                TRICUSPID VALVE MV Area (PHT): 3.27 cm     TR Peak grad:   26.8 mmHg MV Decel Time: 232 msec     TR Vmax:        259.00 cm/s MV E velocity: 126.00 cm/s MV A velocity: 59.20 cm/s   SHUNTS MV E/A ratio:  2.13         Systemic Diam: 2.20 cm Rozann Lesches MD Electronically signed by  Rozann Lesches MD Signature Date/Time: 04/07/2020/9:59:07 AM    Final    CT Angio Abd/Pel W and/or Wo Contrast  Result Date: 04/06/2020 CLINICAL DATA:  Epigastric and back pain since 3 a.m. EXAM: CTA ABDOMEN AND PELVIS WITHOUT AND WITH CONTRAST TECHNIQUE: Multidetector CT imaging of the abdomen and pelvis was performed using the standard protocol during bolus administration of intravenous contrast. Multiplanar reconstructed images and MIPs were obtained and reviewed to evaluate the vascular anatomy. CONTRAST:  141mL OMNIPAQUE IOHEXOL 350 MG/ML SOLN COMPARISON:  None. FINDINGS: VASCULAR Aorta: Diffuse atheromatous wall thickening with mixed density. No aneurysm or dissection. Celiac: Mild atherosclerosis.  No acute finding or stenosis. SMA: Mild atherosclerosis.  No acute finding or proximal stenosis. Renals: 3 left renal arteries. No flow limiting stenosis, branch occlusion, or beading. IMA: Patent Inflow: Atherosclerosis without stenosis or dissection. Proximal Outflow: Atherosclerosis. Veins: Negative in the arterial phase Review of the MIP images confirms the above findings. NON-VASCULAR Lower chest:  Extensive coronary atherosclerosis. Hepatobiliary: No focal liver abnormality.Cholelithiasis without evidence of cholecystitis. Pancreas: Unremarkable. Spleen: Unremarkable. Adrenals/Urinary Tract: Negative adrenals. No hydronephrosis or stone. Unremarkable bladder. Stomach/Bowel:  No obstruction. No appendicitis. Lymphatic: No mass or adenopathy. Reproductive:Enlarged  prostate up lifting the bladder base. Other: No ascites or pneumoperitoneum. Musculoskeletal: No acute abnormalities. Advanced lumbar spine degeneration. IMPRESSION: VASCULAR No emergent finding. Aortic Atherosclerosis without flow limiting stenosis of major branch vessels (ICD10-I70.0). Coronary atherosclerosis. NON-VASCULAR 1. No acute finding. 2. Cholelithiasis. Electronically Signed   By: Monte Fantasia M.D.   On: 04/06/2020 07:33   US Abdomen Limited RUQ  Result Date: 04/06/2020 CLINICAL DATA:  Right upper quadrant pain for 10 hours EXAM: ULTRASOUND ABDOMEN LIMITED RIGHT UPPER QUADRANT COMPARISON:  CTA [facility] 04/06/2020 FINDINGS: Gallbladder: The gallbladder appears distended measuring 11 cm in length. There is gallbladder wall thickening measuring 5 mm. Multiple stones are identified which measure up to 8 mm. Positive sonographic Murphy's sign. Negative for pericholecystic fluid. Common bile duct: Diameter: 6.1 mm Liver: No focal lesion identified. Within normal limits in parenchymal echogenicity. Portal vein is patent on color Doppler imaging with normal direction of blood flow towards the liver. Other: None. IMPRESSION: 1. Gallstones, gallbladder wall thickening and distention. A positive sonographic Murphy sign was elicited by the ultrasound technologist. Imaging findings are compatible with cholecystitis. If further imaging is clinically indicated consider nuclear medicine hepatic biliary scan to assess patency of the cystic duct. Electronically Signed   By: Kerby Moors M.D.   On: 04/06/2020 10:18    Micro Results   Recent Results (from the past 240 hour(s))  Surgical PCR screen     Status: None   Collection Time: 04/07/20  6:38 AM   Specimen: Nasal Mucosa; Nasal Swab  Result Value Ref Range Status   MRSA, PCR NEGATIVE NEGATIVE Final   Staphylococcus aureus NEGATIVE NEGATIVE Final    Comment: (NOTE) The Xpert SA Assay (FDA approved for NASAL specimens in patients 64 years of age and  older), is one component of a comprehensive surveillance program. It is not intended to diagnose infection nor to guide or monitor treatment. Performed at [facility], [address], [place], [place] [address]   SARS Coronavirus 2 by RT PCR (hospital order, performed in [facility] hospital lab) Nasopharyngeal Nasopharyngeal Swab     Status: None   Collection Time: 04/07/20  9:07 AM   Specimen: Nasopharyngeal Swab  Result Value Ref Range Status   SARS Coronavirus 2 NEGATIVE NEGATIVE Final    Comment: (NOTE) SARS-CoV-2 target nucleic  acids are NOT DETECTED. The SARS-CoV-2 RNA is generally detectable in upper and lower respiratory specimens during the acute phase of infection. The lowest concentration of SARS-CoV-2 viral copies this assay can detect is 250 copies / mL. A negative result does not preclude SARS-CoV-2 infection and should not be used as the sole basis for treatment or other patient management decisions.  A negative result may occur with improper specimen collection / handling, submission of specimen other than nasopharyngeal swab, presence of viral mutation(s) within the areas targeted by this assay, and inadequate number of viral copies (<250 copies / mL). A negative result must be combined with clinical observations, patient history, and epidemiological information. Fact Sheet for Patients:   StrictlyIdeas.no Fact Sheet for Healthcare Providers: BankingDealers.co.za This test is not yet approved or cleared  by the [place] FDA and has been authorized for detection and/or diagnosis of SARS-CoV-2 by FDA under an Emergency Use Authorization (EUA).  This EUA will remain in effect (meaning this test can be used) for the duration of the COVID-19 declaration under Section 564(b)(1) of the Act, 21 U.S.C. section 360bbb-3(b)(1), unless the authorization is terminated or revoked sooner. Performed at [facility], [address], [place], [place] [address]        Today   Subjective    Kolston Lacount today has no new complaints No fever  Or chills  No Nausea, Vomiting or Diarrhea --- Ambulating around the unit with his wife--- no chest pains palpitations or dizziness -Endorses his usual baseline DOE          Patient has been seen and examined prior to discharge   Objective   Blood pressure 125/61, pulse 78, temperature 98.6 F (37 C), temperature source Oral, resp. rate 20, height 6' (1.829 m), weight 127 kg, SpO2 97 %.   Intake/Output Summary (Last 24 hours) at 04/08/2020 1133 Last data filed at 04/08/2020 0853 Gross per 24 hour  Intake 1583 ml  Output 20 ml  Net 1563 ml   Exam Gen:- Awake Alert, no acute distress , speaking in complete sentences, obese HEENT:- [place].AT, No sclera icterus Neck-Supple Neck,No JVD,.  Lungs-  CTAB , good air movement bilaterally  CV- S1, S2 normal, regular Abd-  +ve B.Sounds, Abd Soft, increased truncal adiposity, appropriate postop tenderness around surgical wounds Extremity/Skin:-1-2+ pitting edema,   good pulses Psych-affect is appropriate, oriented x3 Neuro-no new focal deficits, no tremors    Data Review   CBC w Diff:  Lab Results  Component Value Date   WBC 6.0 04/08/2020   HGB 14.5 04/08/2020   HCT 46.2 04/08/2020   PLT 152 04/08/2020   LYMPHOPCT 26 04/06/2020   MONOPCT 6 04/06/2020   EOSPCT 7 04/06/2020   BASOPCT 0 04/06/2020   CMP:  Lab Results  Component Value Date   NA 137 04/08/2020   K 4.1 04/08/2020   CL 99 04/08/2020   CO2 29 04/08/2020   BUN 15 04/08/2020   CREATININE 1.21 04/08/2020   CREATININE 0.98 01/15/2015   PROT 6.9 04/08/2020   ALBUMIN 3.8 04/08/2020   BILITOT 1.1 04/08/2020   ALKPHOS 48 04/08/2020   AST 35 04/08/2020   ALT 43 04/08/2020    Total Discharge time is about 33 minutes  Roxan Hockey M.D on 04/08/2020 at 11:33 AM  Go to [email or web address] -  for contact info  [facility] - Office   [phone]

## 2020-04-08 NOTE — Discharge Instructions (Signed)
1)Avoid ibuprofen/Advil/Aleve/Motrin/Goody Powders/Naproxen/BC powders/Meloxicam/Diclofenac/Indomethacin and other Nonsteroidal anti-inflammatory medications as these will make you more likely to bleed and can cause stomach ulcers, can also cause Kidney problems.   2) avoid lifting more than 20 pounds for the next 6 weeks  3) post operative/postsurgical follow-up as advised by general surgeon  4) please note that there has been some changes to your medications, metoprolol/Toprol-XL has been added for your heart  5) you have refused further evaluation for your sleep apnea and possible CPAP machine--- your primary care physician can refer you to a pulmonologist Dr. Halford Chessman here in [place] if you change your mind   Discharge Laparoscopic Surgery Instructions:  Common Complaints: Right shoulder pain is common after laparoscopic surgery. This is secondary to the gas used in the surgery being trapped under the diaphragm.  Walk to help your body absorb the gas. This will improve in a few days. Pain at the port sites are common, especially the larger port sites. This will improve with time.  Some nausea is common and poor appetite. The main goal is to stay hydrated the first few days after surgery.   Diet/ Activity: Diet as tolerated. You may not have an appetite, but it is important to stay hydrated. Drink 64 ounces of water a day. Your appetite will return with time.  Shower per your regular routine daily.  Do not take hot showers. Take warm showers that are less than 10 minutes. Rest and listen to your body, but do not remain in bed all day.  Walk everyday for at least 15-20 minutes. Deep cough and move around every 1-2 hours in the first few days after surgery.  Do not lift > 10 lbs, perform excessive bending, pushing, pulling, squatting for 1-2 weeks after surgery.  Do not pick at the dermabond glue on your incision sites.  This glue film will remain in place for 1-2 weeks and will start to  peel off.  Do not place lotions or balms on your incision unless instructed to specifically by Dr. Constance Haw.   Pain Expectations and Narcotics: -After surgery you will have pain associated with your incisions and this is normal. The pain is muscular and nerve pain, and will get better with time. -You are encouraged and expected to take non narcotic medications like tylenol and ibuprofen (when able) to treat pain as multiple modalities can aid with pain treatment. -Narcotics are only used when pain is severe or there is breakthrough pain. -You are not expected to have a pain score of 0 after surgery, as we cannot prevent pain. A pain score of 3-4 that allows you to be functional, move, walk, and tolerate some activity is the goal. The pain will continue to improve over the days after surgery and is dependent on your surgery. -Due to [place] law, we are only able to give a certain amount of pain medication to treat post operative pain, and we only give additional narcotics on a patient by patient basis.  -For most laparoscopic surgery, studies have shown that the majority of patients only need 10-15 narcotic pills, and for open surgeries most patients only need 15-20.   -Having appropriate expectations of pain and knowledge of pain management with non narcotics is important as we do not want anyone to become addicted to narcotic pain medication.  -Using ice packs in the first 48 hours and heating pads after 48 hours, wearing an abdominal binder (when recommended), and using over the counter medications are all ways to help  with pain management.   -Simple acts like meditation and mindfulness practices after surgery can also help with pain control and research has proven the benefit of these practices.  Medication: Take tylenol and ibuprofen as needed for pain control, alternating every 4-6 hours.  Example:  Tylenol 1000mg  @ 6am, 12noon, 6pm, 9midnight (Do not exceed 4000mg  of tylenol a day). Ibuprofen 800mg   @ 9am, 3pm, 9pm, 3am (Do not exceed 3600mg  of ibuprofen a day).  Take Roxicodone for breakthrough pain every 4 hours.  Take Colace for constipation related to narcotic pain medication. If you do not have a bowel movement in 2 days, take Miralax over the counter.  Drink plenty of water to also prevent constipation.   Contact Information: If you have questions or concerns, please call our office, [phone], Monday- Thursday 8AM-5PM and Friday 8AM-12Noon.  If it is after hours or on the weekend, please call [facility] Main Number, [phone], and ask to speak to the surgeon on call for Dr. Constance Haw at [facility].    Laparoscopic Cholecystectomy, Care After This sheet gives you information about how to care for yourself after your procedure. Your health care provider may also give you more specific instructions. If you have problems or questions, contact your health care provider. What can I expect after the procedure? After the procedure, it is common to have:  Pain at your incision sites. You will be given medicines to control this pain.  Mild nausea or vomiting.  Bloating and possible shoulder pain from the air-like gas that was used during the procedure. Follow these instructions at home: Incision care   Follow instructions from your health care provider about how to take care of your incisions. Make sure you: ? Wash your hands with soap and water before you change your bandage (dressing). If soap and water are not available, use hand sanitizer. ? Change your dressing as told by your health care provider. ? Leave stitches (sutures), skin glue, or adhesive strips in place. These skin closures may need to be in place for 2 weeks or longer. If adhesive strip edges start to loosen and curl up, you may trim the loose edges. Do not remove adhesive strips completely unless your health care provider tells you to do that.  Do not take baths, swim, or use a hot tub until your health care provider  approves.   You may shower.  Check your incision area every day for signs of infection. Check for: ? More redness, swelling, or pain. ? More fluid or blood. ? Warmth. ? Pus or a bad smell. Activity  Do not drive or use heavy machinery while taking prescription pain medicine.  Do not lift anything that is heavier than 10 lb (4.5 kg) until your health care provider approves.  Do not play contact sports until your health care provider approves.  Do not drive for 24 hours if you were given a medicine to help you relax (sedative).  Rest as needed. Do not return to work or school until your health care provider approves. General instructions  Take over-the-counter and prescription medicines only as told by your health care provider.  To prevent or treat constipation while you are taking prescription pain medicine, your health care provider may recommend that you: ? Drink enough fluid to keep your urine clear or pale yellow. ? Take over-the-counter or prescription medicines. ? Eat foods that are high in fiber, such as fresh fruits and vegetables, whole grains, and beans. ? Limit foods that are  high in fat and processed sugars, such as fried and sweet foods. Contact a health care provider if:  You develop a rash.  You have more redness, swelling, or pain around your incisions.  You have more fluid or blood coming from your incisions.  Your incisions feel warm to the touch.  You have pus or a bad smell coming from your incisions.  You have a fever.  One or more of your incisions breaks open. Get help right away if:  You have trouble breathing.  You have chest pain.  You have increasing pain in your shoulders.  You faint or feel dizzy when you stand.  You have severe pain in your abdomen.  You have nausea or vomiting that lasts for more than one day.  You have leg pain.   --- 1)Avoid ibuprofen/Advil/Aleve/Motrin/Goody Powders/Naproxen/BC  powders/Meloxicam/Diclofenac/Indomethacin and other Nonsteroidal anti-inflammatory medications as these will make you more likely to bleed and can cause stomach ulcers, can also cause Kidney problems.   2) avoid lifting more than 20 pounds for the next 6 weeks  3) post operative/postsurgical follow-up as advised by general surgeon  4) please note that there has been some changes to your medications, metoprolol/Toprol-XL has been added for your heart  5) you have refused further evaluation for your sleep apnea and possible CPAP machine--- your primary care physician can refer you to a pulmonologist Dr. Halford Chessman here in [place] if you change your mind

## 2020-04-12 DIAGNOSIS — E291 Testicular hypofunction: Secondary | ICD-10-CM | POA: Diagnosis not present

## 2020-04-12 DIAGNOSIS — E538 Deficiency of other specified B group vitamins: Secondary | ICD-10-CM | POA: Diagnosis not present

## 2020-04-16 ENCOUNTER — Ambulatory Visit: Payer: Medicare Other | Admitting: Urology

## 2020-04-23 ENCOUNTER — Other Ambulatory Visit: Payer: Self-pay | Admitting: Urology

## 2020-04-24 LAB — TESTOSTERONE: Testosterone: 402 ng/dL (ref 250–827)

## 2020-04-24 LAB — HEMOGLOBIN AND HEMATOCRIT, BLOOD
HCT: 45.7 % (ref 38.5–50.0)
Hemoglobin: 15.3 g/dL (ref 13.2–17.1)

## 2020-04-27 ENCOUNTER — Telehealth (INDEPENDENT_AMBULATORY_CARE_PROVIDER_SITE_OTHER): Payer: Self-pay | Admitting: General Surgery

## 2020-04-27 ENCOUNTER — Telehealth: Payer: Self-pay

## 2020-04-27 DIAGNOSIS — K8000 Calculus of gallbladder with acute cholecystitis without obstruction: Secondary | ICD-10-CM

## 2020-04-27 NOTE — Progress Notes (Signed)
Subjective:  1. BPH with urinary obstruction   2. Nocturia   3. Urinary frequency   4. Hypogonadism in male   5. Erectile dysfunction due to arterial insufficiency      [name] returns today in f/u.  He has been on TRT with 400mg  IM q2wks.  His T is 402 but he had cut back to 200mg  for costs.  His Hgb is 15.3.   He is doing well on therapy.  He has had progressive ED and has not been using the VED.  He is able to function with a partial erection.  He remains on doxazosin 4mg  bid for his voiding symptoms but had to change to the 8mg  tab and break it.  He has increased frequency with the Torsemide.  He had his gall bladder removed about 2 weeks ago.        IPSS    Row Name 04/30/20 0900         International Prostate Symptom Score   How often have you had the sensation of not emptying your bladder? Less than 1 in 5     How often have you had to urinate less than every two hours? More than half the time     How often have you found you stopped and started again several times when you urinated? Less than 1 in 5 times     How often have you found it difficult to postpone urination? Less than 1 in 5 times     How often have you had a weak urinary stream? Not at All     How often have you had to strain to start urination? Not at All     How many times did you typically get up at night to urinate? 1 Time     Total IPSS Score 8       Quality of Life due to urinary symptoms   If you were to spend the rest of your life with your urinary condition just the way it is now how would you feel about that? Mostly Satisfied             ROS:  ROS  Allergies  Allergen Reactions  . Lasix [Furosemide] Nausea Only    Outpatient Encounter Medications as of 04/30/2020  Medication Sig Note  . aspirin 81 MG tablet Take 1 tablet (81 mg total) by mouth daily with breakfast. For Heart   . atorvastatin (LIPITOR) 10 MG tablet Take 10 mg by mouth daily.   . clonazePAM (KLONOPIN) 0.5 MG tablet 0.5  mg at bedtime.  02/24/2015: Received from: External Pharmacy  . cyanocobalamin (,VITAMIN B-12,) 1000 MCG/ML injection INJECT 1ML AS DIRECTED EVERY 4 WEEKS 04/06/2020: Was due to take this today  . cyanocobalamin 1000 MCG tablet Take 1,000 mcg by mouth daily.   Marland Kitchen doxazosin (CARDURA) 8 MG tablet Take 1 tablet (8 mg total) by mouth daily.   Noelle Penner ASPIRIN ADULT LOW DOSE 81 MG EC tablet Take 81 mg by mouth every morning.   Marland Kitchen PROAIR HFA 108 (90 Base) MCG/ACT inhaler Inhale 2 puffs into the lungs every 4 (four) hours as needed for wheezing or shortness of breath.   . testosterone cypionate (DEPOTESTOSTERONE CYPIONATE) 200 MG/ML injection Inject 2 mLs (400 mg total) into the muscle every 14 (fourteen) days.   Marland Kitchen torsemide (DEMADEX) 20 MG tablet Take 20 mg by mouth daily.   . [DISCONTINUED] testosterone cypionate (DEPOTESTOSTERONE CYPIONATE) 200 MG/ML injection INJECT 2 ML IM EVERY 2  WEEKS 04/06/2020: Patient is only using 1 ml  . acetaminophen (TYLENOL) 325 MG tablet Take 2 tablets (650 mg total) by mouth every 6 (six) hours as needed for mild pain (or Fever >/= 101). (Patient not taking: Reported on 04/30/2020)   . metoprolol succinate (TOPROL XL) 25 MG 24 hr tablet Take 1 tablet (25 mg total) by mouth daily. For Heart   . ondansetron (ZOFRAN) 4 MG tablet Take 1 tablet (4 mg total) by mouth every 6 (six) hours as needed for nausea. (Patient not taking: Reported on 04/30/2020)   . senna-docusate (SENOKOT-S) 8.6-50 MG tablet Take 2 tablets by mouth at bedtime. For bowels (Patient not taking: Reported on 04/30/2020)   . [DISCONTINUED] doxazosin (CARDURA) 4 MG tablet Take 1 tablet (4 mg total) by mouth 2 (two) times daily.   . [DISCONTINUED] oxyCODONE (OXY IR/ROXICODONE) 5 MG immediate release tablet Take 1 tablet (5 mg total) by mouth every 4 (four) hours as needed for severe pain or breakthrough pain.   . [DISCONTINUED] umeclidinium bromide (INCRUSE ELLIPTA) 62.5 MCG/INH AEPB Inhale 1 puff into the lungs daily.    No  facility-administered encounter medications on file as of 04/30/2020.    Past Medical History:  Diagnosis Date  . Arthritis   . 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  . Essential hypertension   . GERD (gastroesophageal reflux disease)   . Melanoma of back ([place])   . Prostatic hypertrophy   . Sleep apnea    Intolerant of CPAP    Past Surgical History:  Procedure Laterality Date  . ANTERIOR CERVICAL DECOMP/DISCECTOMY FUSION    . CARDIOVERSION N/A 01/28/2015   Procedure: CARDIOVERSION;  Surgeon: Arnoldo Lenis, MD;  Location: [facility];  Service: Endoscopy;  Laterality: N/A;  . CHOLECYSTECTOMY N/A 04/07/2020   Procedure: LAPAROSCOPIC CHOLECYSTECTOMY;  Surgeon: Virl Cagey, MD;  Location: [facility];  Service: General;  Laterality: N/A;  . CORONARY ARTERY BYPASS GRAFT N/A 12/29/2014   Procedure: CORONARY ARTERY BYPASS GRAFTING (CABG), ON PUMP, TIMES FOUR, USING LEFT INTERNAL MAMMARY ARTERY, RIGHT GREATER SAPHENOUS VEIN HARVESTED ENDOSCOPICALLY;  Surgeon: Ivin Poot, MD;  Location: [facility];  Service: Open Heart Surgery;  Laterality: N/A;  . KNEE ARTHROSCOPY Right   . KNEE CARTILAGE SURGERY Left   . LEFT HEART CATHETERIZATION WITH CORONARY ANGIOGRAM N/A 12/28/2014   Procedure: LEFT HEART CATHETERIZATION WITH CORONARY ANGIOGRAM;  Surgeon: Lorretta Harp, MD;  Location: [facility] CATH LAB;  Service: Cardiovascular;  Laterality: N/A;  . MELANOMA EXCISION     "back"  . SHOULDER OPEN ROTATOR CUFF REPAIR Left   . TEE WITHOUT CARDIOVERSION N/A 12/29/2014   Procedure: TRANSESOPHAGEAL ECHOCARDIOGRAM (TEE);  Surgeon: Ivin Poot, MD;  Location: [facility];  Service: Open Heart Surgery;  Laterality: N/A;  . TEE WITHOUT CARDIOVERSION N/A 01/28/2015   Procedure: TRANSESOPHAGEAL ECHOCARDIOGRAM (TEE);  Surgeon: Arnoldo Lenis, MD;  Location: [facility];  Service: Endoscopy;  Laterality: N/A;    Social  History   Socioeconomic History  . Marital status: Married    Spouse name: Not on file  . Number of children: 7  . Years of education: Not on file  . Highest education level: Not on file  Occupational History  . Not on file  Tobacco Use  . Smoking status: Never Smoker  . Smokeless tobacco: Never Used  Vaping Use  .  Vaping Use: Never used  Substance and Sexual Activity  . Alcohol use: Yes    Alcohol/week: 0.0 standard drinks    Comment: Occasional  . Drug use: No  . Sexual activity: Yes    Partners: Female  Other Topics Concern  . Not on file  Social History Narrative   Lives at home with wife.  They have seven children.     Social Determinants of Health   Financial Resource Strain:   . Difficulty of Paying Living Expenses:   Food Insecurity:   . Worried About Charity fundraiser in the Last Year:   . Arboriculturist in the Last Year:   Transportation Needs:   . Film/video editor (Medical):   Marland Kitchen Lack of Transportation (Non-Medical):   Physical Activity:   . Days of Exercise per Week:   . Minutes of Exercise per Session:   Stress:   . Feeling of Stress :   Social Connections:   . Frequency of Communication with Friends and Family:   . Frequency of Social Gatherings with Friends and Family:   . Attends Religious Services:   . Active Member of Clubs or Organizations:   . Attends Archivist Meetings:   Marland Kitchen Marital Status:   Intimate Partner Violence:   . Fear of Current or Ex-Partner:   . Emotionally Abused:   Marland Kitchen Physically Abused:   . Sexually Abused:     Family History  Problem Relation Age of Onset  . Stroke Mother   . Hypertension Mother   . Heart disease Sister        Died from complications of RHD  . Heart disease Brother        Heart failure related to EtOH and smoking  . Stomach cancer Sister        Objective: Vitals:   04/30/20 0940  BP: 124/68  Pulse: 76  Temp: (!) 97.5 F (36.4 C)     Physical Exam  Lab Results:   Testosterone is 402 Hgb is 15.3. PSA was 1.0 at last visit.        Studies/Results: I reviewed his CT from 04/06/20 and his prostate volume is about 12ml and no obvious middle lobe.     Assessment & Plan: Hypogonadism.  His is doing well on current therapy.  Med refilled.  F/U in 6 months with labs.  BPH with BOO.  He is doing well on doxazosin.  We briefly discussed the Urolift.  His prostate is 61ml without a middle lobe.   ED.  He is getting by on no treatment.     Orders Placed This Encounter  Procedures  . Hemoglobin and hematocrit, blood    Standing Status:   Future    Standing Expiration Date:   04/30/2021  . Testosterone    Standing Status:   Future    Standing Expiration Date:   04/30/2021  . PSA, total and free    Standing Status:   Future    Standing Expiration Date:   04/30/2021    Return in about 6 months (around 10/31/2020) for with labs.   CC: [name], MD       Irine Seal 04/30/2020

## 2020-04-27 NOTE — Progress Notes (Addendum)
[facility]  I am calling the patient for post operative evaluation due to the current COVID 19 pandemic.  The patient had a laparoscopic cholecystectomy on 04/07/2020. Tried patient's home, mobile, and wife's mobile. Unable to get any answer. Left a message at home for them to call with concerns.   Patient called back and says he is doing well. He had some soreness on the right side but is further around from his port sites. Says that it is improving.  Says he is eating, having good pain control, and having regular Bms. Not needing any stool softener. Incisions are healing.   Will see PRN.   Pathology: FINAL MICROSCOPIC DIAGNOSIS:   A. GALLBLADDER, CHOLECYSTECTOMY:  - Acute cholecystitis.  - Cholelithiasis.   Curlene Labrum, MD [facility] [address] [place], [place] [address] [phone] (office)

## 2020-04-27 NOTE — Telephone Encounter (Signed)
-----   Message from Irine Seal, MD sent at 04/26/2020  4:24 PM EDT ----- Hgb and testosterone are both normal

## 2020-04-27 NOTE — Telephone Encounter (Signed)
Pt notified of labs

## 2020-04-28 ENCOUNTER — Other Ambulatory Visit: Payer: Self-pay

## 2020-04-28 ENCOUNTER — Telehealth: Payer: Self-pay

## 2020-04-28 MED ORDER — DOXAZOSIN MESYLATE 8 MG PO TABS
8.0000 mg | ORAL_TABLET | Freq: Every day | ORAL | 11 refills | Status: DC
Start: 2020-04-28 — End: 2020-11-04

## 2020-04-28 NOTE — Telephone Encounter (Signed)
Pharmacy sent rx request per insurance to change pts  Doxazosin mesylate from 4 mg bid to 8 mg qd. Dr. Jeffie Pollock gave permission. New rx was sent in to [facility].

## 2020-04-30 ENCOUNTER — Ambulatory Visit (INDEPENDENT_AMBULATORY_CARE_PROVIDER_SITE_OTHER): Payer: Medicare Other | Admitting: Urology

## 2020-04-30 ENCOUNTER — Other Ambulatory Visit: Payer: Self-pay

## 2020-04-30 ENCOUNTER — Encounter: Payer: Self-pay | Admitting: Urology

## 2020-04-30 VITALS — BP 124/68 | HR 76 | Temp 97.5°F | Ht 72.0 in | Wt 271.0 lb

## 2020-04-30 DIAGNOSIS — N5201 Erectile dysfunction due to arterial insufficiency: Secondary | ICD-10-CM

## 2020-04-30 DIAGNOSIS — I25119 Atherosclerotic heart disease of native coronary artery with unspecified angina pectoris: Secondary | ICD-10-CM

## 2020-04-30 DIAGNOSIS — R351 Nocturia: Secondary | ICD-10-CM | POA: Diagnosis not present

## 2020-04-30 DIAGNOSIS — E291 Testicular hypofunction: Secondary | ICD-10-CM

## 2020-04-30 DIAGNOSIS — N401 Enlarged prostate with lower urinary tract symptoms: Secondary | ICD-10-CM

## 2020-04-30 DIAGNOSIS — R350 Frequency of micturition: Secondary | ICD-10-CM | POA: Diagnosis not present

## 2020-04-30 DIAGNOSIS — N138 Other obstructive and reflux uropathy: Secondary | ICD-10-CM | POA: Diagnosis not present

## 2020-04-30 MED ORDER — TESTOSTERONE CYPIONATE 200 MG/ML IM SOLN: 400.0000 mg | INTRAMUSCULAR | 2 refills | Status: DC

## 2020-04-30 NOTE — Progress Notes (Signed)
Urological Symptom Review  Patient is experiencing the following symptoms: Frequent urination Hard to postpone urination   Review of Systems  Gastrointestinal (upper)  : Nausea  Gastrointestinal (lower) : Negative for lower GI symptoms  Constitutional : Negative for symptoms  Skin: Skin rash/lesion  Eyes: Negative for eye symptoms  Ear/Nose/Throat : Negative for Ear/Nose/Throat symptoms  Hematologic/Lymphatic: Negative for Hematologic/Lymphatic symptoms  Cardiovascular : Leg swelling  Respiratory : Shortness of breath  Endocrine: Negative for endocrine symptoms  Musculoskeletal: Joint pain  Neurological: Dizziness  Psychologic: Negative for psychiatric symptoms

## 2020-05-10 DIAGNOSIS — I10 Essential (primary) hypertension: Secondary | ICD-10-CM | POA: Diagnosis not present

## 2020-05-10 DIAGNOSIS — E291 Testicular hypofunction: Secondary | ICD-10-CM | POA: Diagnosis not present

## 2020-05-10 DIAGNOSIS — R609 Edema, unspecified: Secondary | ICD-10-CM | POA: Diagnosis not present

## 2020-05-10 DIAGNOSIS — N538 Other male sexual dysfunction: Secondary | ICD-10-CM | POA: Diagnosis not present

## 2020-05-18 DIAGNOSIS — Z23 Encounter for immunization: Secondary | ICD-10-CM | POA: Diagnosis not present

## 2020-05-24 DIAGNOSIS — E291 Testicular hypofunction: Secondary | ICD-10-CM | POA: Diagnosis not present

## 2020-05-24 DIAGNOSIS — E538 Deficiency of other specified B group vitamins: Secondary | ICD-10-CM | POA: Diagnosis not present

## 2020-06-07 DIAGNOSIS — E291 Testicular hypofunction: Secondary | ICD-10-CM | POA: Diagnosis not present

## 2020-06-07 DIAGNOSIS — E538 Deficiency of other specified B group vitamins: Secondary | ICD-10-CM | POA: Diagnosis not present

## 2020-06-21 DIAGNOSIS — E291 Testicular hypofunction: Secondary | ICD-10-CM | POA: Diagnosis not present

## 2020-07-12 DIAGNOSIS — E291 Testicular hypofunction: Secondary | ICD-10-CM | POA: Diagnosis not present

## 2020-08-06 DIAGNOSIS — I10 Essential (primary) hypertension: Secondary | ICD-10-CM | POA: Diagnosis not present

## 2020-08-06 DIAGNOSIS — Z79899 Other long term (current) drug therapy: Secondary | ICD-10-CM | POA: Diagnosis not present

## 2020-08-06 DIAGNOSIS — R609 Edema, unspecified: Secondary | ICD-10-CM | POA: Diagnosis not present

## 2020-08-10 DIAGNOSIS — R609 Edema, unspecified: Secondary | ICD-10-CM | POA: Diagnosis not present

## 2020-08-10 DIAGNOSIS — I2510 Atherosclerotic heart disease of native coronary artery without angina pectoris: Secondary | ICD-10-CM | POA: Diagnosis not present

## 2020-08-10 DIAGNOSIS — E291 Testicular hypofunction: Secondary | ICD-10-CM | POA: Diagnosis not present

## 2020-08-10 DIAGNOSIS — Z23 Encounter for immunization: Secondary | ICD-10-CM | POA: Diagnosis not present

## 2020-08-10 DIAGNOSIS — D519 Vitamin B12 deficiency anemia, unspecified: Secondary | ICD-10-CM | POA: Diagnosis not present

## 2020-08-24 DIAGNOSIS — E291 Testicular hypofunction: Secondary | ICD-10-CM | POA: Diagnosis not present

## 2020-09-07 DIAGNOSIS — E291 Testicular hypofunction: Secondary | ICD-10-CM | POA: Diagnosis not present

## 2020-09-21 DIAGNOSIS — E291 Testicular hypofunction: Secondary | ICD-10-CM | POA: Diagnosis not present

## 2020-10-05 DIAGNOSIS — E291 Testicular hypofunction: Secondary | ICD-10-CM | POA: Diagnosis not present

## 2020-10-19 DIAGNOSIS — E291 Testicular hypofunction: Secondary | ICD-10-CM | POA: Diagnosis not present

## 2020-10-27 ENCOUNTER — Other Ambulatory Visit: Payer: Medicare Other

## 2020-10-27 ENCOUNTER — Other Ambulatory Visit: Payer: Self-pay

## 2020-10-27 DIAGNOSIS — E291 Testicular hypofunction: Secondary | ICD-10-CM

## 2020-10-27 DIAGNOSIS — N5201 Erectile dysfunction due to arterial insufficiency: Secondary | ICD-10-CM

## 2020-10-27 DIAGNOSIS — N138 Other obstructive and reflux uropathy: Secondary | ICD-10-CM | POA: Diagnosis not present

## 2020-10-27 DIAGNOSIS — N401 Enlarged prostate with lower urinary tract symptoms: Secondary | ICD-10-CM

## 2020-10-28 LAB — HEMOGLOBIN: Hemoglobin: 16.3 g/dL (ref 13.0–17.7)

## 2020-10-28 LAB — PSA, TOTAL AND FREE
PSA, Free Pct: 31.5 %
PSA, Free: 0.41 ng/mL
Prostate Specific Ag, Serum: 1.3 ng/mL (ref 0.0–4.0)

## 2020-10-28 LAB — TESTOSTERONE: Testosterone: 801 ng/dL (ref 264–916)

## 2020-10-28 LAB — HEMATOCRIT: Hematocrit: 49.3 % (ref 37.5–51.0)

## 2020-11-03 DIAGNOSIS — Z79899 Other long term (current) drug therapy: Secondary | ICD-10-CM | POA: Diagnosis not present

## 2020-11-03 DIAGNOSIS — E785 Hyperlipidemia, unspecified: Secondary | ICD-10-CM | POA: Diagnosis not present

## 2020-11-03 DIAGNOSIS — I10 Essential (primary) hypertension: Secondary | ICD-10-CM | POA: Diagnosis not present

## 2020-11-03 DIAGNOSIS — D510 Vitamin B12 deficiency anemia due to intrinsic factor deficiency: Secondary | ICD-10-CM | POA: Diagnosis not present

## 2020-11-03 DIAGNOSIS — Z125 Encounter for screening for malignant neoplasm of prostate: Secondary | ICD-10-CM | POA: Diagnosis not present

## 2020-11-03 DIAGNOSIS — R609 Edema, unspecified: Secondary | ICD-10-CM | POA: Diagnosis not present

## 2020-11-03 DIAGNOSIS — M1990 Unspecified osteoarthritis, unspecified site: Secondary | ICD-10-CM | POA: Diagnosis not present

## 2020-11-03 DIAGNOSIS — I2510 Atherosclerotic heart disease of native coronary artery without angina pectoris: Secondary | ICD-10-CM | POA: Diagnosis not present

## 2020-11-03 DIAGNOSIS — D519 Vitamin B12 deficiency anemia, unspecified: Secondary | ICD-10-CM | POA: Diagnosis not present

## 2020-11-03 DIAGNOSIS — E291 Testicular hypofunction: Secondary | ICD-10-CM | POA: Diagnosis not present

## 2020-11-03 NOTE — Progress Notes (Signed)
Subjective:  1. BPH with urinary obstruction   2. 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 Erectile dysfunction due to arterial insufficiency      Devin [name] returns today in f/u.  He has been on TRT with 400mg  IM q2wks.  His T is 801 on 150mg  which is a decrease from 200mg .  His Hgb is 16.3.   He is doing well on therapy.  He has had progressive ED and has not been using the VED.  He is able to function with a partial erection.    He remains on doxazosin 4mg  bid for his voiding symptoms which I have refilled.  He has increased frequency with the Torsemide.  His PSA is 1.3 which is stable.       IPSS    Row Name 11/04/20 0900         International Prostate Symptom Score   How often have you had the sensation of not emptying your bladder? Less than 1 in 5     How often have you had to urinate less than every two hours? About half the time     How often have you found you stopped and started again several times when you urinated? Less than 1 in 5 times     How often have you found it difficult to postpone urination? Less than half the time     How often have you had a weak urinary stream? Less than 1 in 5 times     How often have you had to strain to start urination? Not at All     How many times did you typically get up at night to urinate? 2 Times     Total IPSS Score 10           Quality of Life due to urinary symptoms   If you were to spend the rest of your life with your urinary condition just the way it is now how would you feel about that? Mixed             ROS:  ROS  Allergies  Allergen Reactions   Lasix [Furosemide] Nausea Only    Outpatient Encounter Medications as of 11/04/2020  Medication Sig Note   acetaminophen (TYLENOL) 325 MG tablet Take 2 tablets (650 mg total) by mouth every 6 (six) hours as needed for mild pain (or Fever >/= 101).    aspirin 81 MG tablet Take 1 tablet (81 mg total) by mouth daily with breakfast. For Heart    atorvastatin  (LIPITOR) 10 MG tablet Take 10 mg by mouth daily.    clonazePAM (KLONOPIN) 0.5 MG tablet 0.5 mg at bedtime.  02/24/2015: Received from: External Pharmacy   cyanocobalamin (,VITAMIN B-12,) 1000 MCG/ML injection INJECT AS DIRECTED EVERY 4 WEEKS 04/06/2020: Was due to take this today   cyanocobalamin 1000 MCG tablet Take 1,000 mcg by mouth daily.    doxazosin (CARDURA) 4 MG tablet Take 1 tablet (4 mg total) by mouth daily.    EQ ASPIRIN ADULT LOW DOSE 81 MG EC tablet Take 81 mg by mouth every morning.    metoprolol succinate (TOPROL XL) 25 MG 24 hr tablet Take 1 tablet (25 mg total) by mouth daily. For Heart    ondansetron (ZOFRAN) 4 MG tablet Take 1 tablet (4 mg total) by mouth every 6 (six) hours as needed for nausea.    PROAIR HFA 108 (90 Base) MCG/ACT inhaler Inhale 2 puffs into the lungs  every 4 (four) hours as needed for wheezing or shortness of breath.    torsemide (DEMADEX) 20 MG tablet Take 20 mg by mouth daily.    [DISCONTINUED] doxazosin (CARDURA) 8 MG tablet Take 1 tablet (8 mg total) by mouth daily.    [DISCONTINUED] testosterone cypionate (DEPOTESTOSTERONE CYPIONATE) 200 MG/ML injection Inject 2 mLs (400 mg total) into the muscle every 14 (fourteen) days.    testosterone cypionate (DEPOTESTOSTERONE CYPIONATE) 200 MG/ML injection Inject 2 mLs (400 mg total) into the muscle every 14 (fourteen) days.    No facility-administered encounter medications on file as of 11/04/2020.    Past Medical History:  Diagnosis Date   Arthritis    Asbestosis ([place])    CAD (coronary artery disease)    Multivessel status post CABG March 2016 - LIMA to LAD, SVG to OM1, SVG to OM2, and SVG to PDA.   Essential hypertension    GERD (gastroesophageal reflux disease)    Melanoma of back ([facility])    Prostatic hypertrophy    Sleep apnea    Intolerant of CPAP    Past Surgical History:  Procedure Laterality Date   ANTERIOR CERVICAL DECOMP/DISCECTOMY FUSION     CARDIOVERSION N/A 01/28/2015    Procedure: CARDIOVERSION;  Surgeon: Arnoldo Lenis, MD;  Location: [facility];  Service: Endoscopy;  Laterality: N/A;   CHOLECYSTECTOMY N/A 04/07/2020   Procedure: LAPAROSCOPIC CHOLECYSTECTOMY;  Surgeon: Virl Cagey, MD;  Location: [facility];  Service: General;  Laterality: N/A;   CORONARY ARTERY BYPASS GRAFT N/A 12/29/2014   Procedure: CORONARY ARTERY BYPASS GRAFTING (CABG), ON PUMP, TIMES FOUR, USING LEFT INTERNAL MAMMARY ARTERY, RIGHT GREATER SAPHENOUS VEIN HARVESTED ENDOSCOPICALLY;  Surgeon: Ivin Poot, MD;  Location: [facility];  Service: Open Heart Surgery;  Laterality: N/A;   KNEE ARTHROSCOPY Right    KNEE CARTILAGE SURGERY Left    LEFT HEART CATHETERIZATION WITH CORONARY ANGIOGRAM N/A 12/28/2014   Procedure: LEFT HEART CATHETERIZATION WITH CORONARY ANGIOGRAM;  Surgeon: Lorretta Harp, MD;  Location: [facility] CATH LAB;  Service: Cardiovascular;  Laterality: N/A;   MELANOMA EXCISION     "back"   SHOULDER OPEN ROTATOR CUFF REPAIR Left    TEE WITHOUT CARDIOVERSION N/A 12/29/2014   Procedure: TRANSESOPHAGEAL ECHOCARDIOGRAM (TEE);  Surgeon: Ivin Poot, MD;  Location: [facility];  Service: Open Heart Surgery;  Laterality: N/A;   TEE WITHOUT CARDIOVERSION N/A 01/28/2015   Procedure: TRANSESOPHAGEAL ECHOCARDIOGRAM (TEE);  Surgeon: Arnoldo Lenis, MD;  Location: [facility];  Service: Endoscopy;  Laterality: N/A;    Social History   Socioeconomic History   Marital status: Married    Spouse name: Not on file   Number of children: 7   Years of education: Not on file   Highest education level: Not on file  Occupational History   Not on file  Tobacco Use   Smoking status: Never Smoker   Smokeless tobacco: Never Used  Vaping Use   Vaping Use: Never used  Substance and Sexual Activity   Alcohol use: Yes    Alcohol/week: 0.0 standard drinks    Comment: Occasional   Drug use: No   Sexual activity: Yes    Partners: Female  Other Topics Concern   Not on file  Social History  Narrative   Lives at home with wife.  They have seven children.     Social Determinants of Health   Financial Resource Strain: Not on file  Food Insecurity: Not on file  Transportation Needs: Not on file  Physical Activity:  Not on file  Stress: Not on file  Social Connections: Not on file  Intimate Partner Violence: Not on file    Family History  Problem Relation Age of Onset   Stroke Mother    Hypertension Mother    Heart disease Sister        Died from complications of RHD   Heart disease Brother        Heart failure related to EtOH and smoking   Stomach cancer Sister        Objective: Vitals:   11/04/20 0925  BP: (!) 149/73  Pulse: 75  Temp: 98.3 F (36.8 C)     Physical Exam  Lab Results:  Testosterone is 801 Hgb is 16.3. PSA was 1.3 at last visit.  UA is clear.        Studies/Results:     Assessment & Plan: Hypogonadism.  His is doing well on current therapy.  Med refilled.  F/U in 6 months with labs.  BPH with BOO.  He is doing well on doxazosin.   His prostate is 82ml without a middle lobe.   ED.  He is getting by on no treatment.     Orders Placed This Encounter  Procedures   Urinalysis, Routine w reflex microscopic   Hemoglobin and hematocrit, blood    Standing Status:   Future    Standing Expiration Date:   11/04/2021   Testosterone    Standing Status:   Future    Standing Expiration Date:   11/04/2021    Return in about 6 months (around 05/04/2021).   CC: [name], MD       Devin [name] 11/04/2020

## 2020-11-04 ENCOUNTER — Ambulatory Visit (INDEPENDENT_AMBULATORY_CARE_PROVIDER_SITE_OTHER): Payer: Medicare Other | Admitting: Urology

## 2020-11-04 ENCOUNTER — Encounter: Payer: Self-pay | Admitting: Urology

## 2020-11-04 ENCOUNTER — Other Ambulatory Visit: Payer: Self-pay

## 2020-11-04 VITALS — BP 149/73 | HR 75 | Temp 98.3°F | Ht 72.0 in | Wt 280.0 lb

## 2020-11-04 DIAGNOSIS — R350 Frequency of micturition: Secondary | ICD-10-CM

## 2020-11-04 DIAGNOSIS — N5201 Erectile dysfunction due to arterial insufficiency: Secondary | ICD-10-CM

## 2020-11-04 DIAGNOSIS — E291 Testicular hypofunction: Secondary | ICD-10-CM

## 2020-11-04 DIAGNOSIS — N138 Other obstructive and reflux uropathy: Secondary | ICD-10-CM | POA: Diagnosis not present

## 2020-11-04 DIAGNOSIS — N401 Enlarged prostate with lower urinary tract symptoms: Secondary | ICD-10-CM | POA: Diagnosis not present

## 2020-11-04 LAB — URINALYSIS, ROUTINE W REFLEX MICROSCOPIC
Bilirubin, UA: NEGATIVE
Glucose, UA: NEGATIVE
Ketones, UA: NEGATIVE
Leukocytes,UA: NEGATIVE
Nitrite, UA: NEGATIVE
Protein,UA: NEGATIVE
RBC, UA: NEGATIVE
Specific Gravity, UA: 1.025 (ref 1.005–1.030)
Urobilinogen, Ur: 0.2 mg/dL (ref 0.2–1.0)
pH, UA: 5.5 (ref 5.0–7.5)

## 2020-11-04 MED ORDER — DOXAZOSIN MESYLATE 4 MG PO TABS: 4.0000 mg | ORAL_TABLET | Freq: Every day | ORAL | 3 refills | Status: DC

## 2020-11-04 MED ORDER — TESTOSTERONE CYPIONATE 200 MG/ML IM SOLN: 400.0000 mg | INTRAMUSCULAR | 2 refills | Status: DC

## 2020-11-04 NOTE — Progress Notes (Signed)
Urological Symptom Review  Patient is experiencing the following symptoms: Frequent urination Erection problems (male only)   Review of Systems  Gastrointestinal (upper)  : Negative for upper GI symptoms  Gastrointestinal (lower) : Constipation  Constitutional : Night Sweats Fatigue  Skin: Itching  Eyes: Negative for eye symptoms  Ear/Nose/Throat : Negative for Ear/Nose/Throat symptoms  Hematologic/Lymphatic: Negative for Hematologic/Lymphatic symptoms  Cardiovascular : Leg swelling  Respiratory : Shortness of breath  Endocrine: Negative for endocrine symptoms  Musculoskeletal: Joint pain  Neurological: Negative for neurological symptoms  Psychologic: Negative for psychiatric symptoms

## 2020-11-05 ENCOUNTER — Ambulatory Visit: Payer: Medicare Other | Admitting: Urology

## 2020-11-17 DIAGNOSIS — R609 Edema, unspecified: Secondary | ICD-10-CM | POA: Diagnosis not present

## 2020-11-17 DIAGNOSIS — E785 Hyperlipidemia, unspecified: Secondary | ICD-10-CM | POA: Diagnosis not present

## 2020-11-17 DIAGNOSIS — E291 Testicular hypofunction: Secondary | ICD-10-CM | POA: Diagnosis not present

## 2020-11-17 DIAGNOSIS — I2510 Atherosclerotic heart disease of native coronary artery without angina pectoris: Secondary | ICD-10-CM | POA: Diagnosis not present

## 2020-11-17 DIAGNOSIS — D696 Thrombocytopenia, unspecified: Secondary | ICD-10-CM | POA: Diagnosis not present

## 2020-11-17 DIAGNOSIS — J61 Pneumoconiosis due to asbestos and other mineral fibers: Secondary | ICD-10-CM | POA: Diagnosis not present

## 2020-11-25 DIAGNOSIS — Z85820 Personal history of malignant melanoma of skin: Secondary | ICD-10-CM | POA: Diagnosis not present

## 2020-11-25 DIAGNOSIS — L910 Hypertrophic scar: Secondary | ICD-10-CM | POA: Diagnosis not present

## 2020-11-25 DIAGNOSIS — L570 Actinic keratosis: Secondary | ICD-10-CM | POA: Diagnosis not present

## 2020-11-25 DIAGNOSIS — Z85828 Personal history of other malignant neoplasm of skin: Secondary | ICD-10-CM | POA: Diagnosis not present

## 2020-12-02 DIAGNOSIS — E291 Testicular hypofunction: Secondary | ICD-10-CM | POA: Diagnosis not present

## 2020-12-02 DIAGNOSIS — D519 Vitamin B12 deficiency anemia, unspecified: Secondary | ICD-10-CM | POA: Diagnosis not present

## 2020-12-07 ENCOUNTER — Ambulatory Visit (INDEPENDENT_AMBULATORY_CARE_PROVIDER_SITE_OTHER): Payer: Medicare Other | Admitting: Internal Medicine

## 2020-12-07 ENCOUNTER — Other Ambulatory Visit: Payer: Self-pay

## 2020-12-07 ENCOUNTER — Encounter: Payer: Self-pay | Admitting: Internal Medicine

## 2020-12-07 ENCOUNTER — Other Ambulatory Visit (HOSPITAL_COMMUNITY)
Admission: RE | Admit: 2020-12-07 | Discharge: 2020-12-07 | Disposition: A | Discharge status: Home / Self Care | Payer: Medicare Other | Source: Ambulatory Visit | Attending: Internal Medicine | Admitting: Internal Medicine

## 2020-12-07 VITALS — BP 140/80 | HR 76 | Temp 98.7°F | Ht 72.0 in | Wt 287.8 lb

## 2020-12-07 DIAGNOSIS — R0609 Other forms of dyspnea: Secondary | ICD-10-CM

## 2020-12-07 DIAGNOSIS — R0600 Dyspnea, unspecified: Secondary | ICD-10-CM | POA: Diagnosis not present

## 2020-12-07 LAB — CBC WITH DIFFERENTIAL/PLATELET
Abs Immature Granulocytes: 0.04 10*3/uL (ref 0.00–0.07)
Basophils Absolute: 0 10*3/uL (ref 0.0–0.1)
Basophils Relative: 0 %
Eosinophils Absolute: 0.2 10*3/uL (ref 0.0–0.5)
Eosinophils Relative: 3 %
HCT: 50.3 % (ref 39.0–52.0)
Hemoglobin: 16.1 g/dL (ref 13.0–17.0)
Immature Granulocytes: 1 %
Lymphocytes Relative: 28 %
Lymphs Abs: 1.4 10*3/uL (ref 0.7–4.0)
MCH: 31.6 pg (ref 26.0–34.0)
MCHC: 32 g/dL (ref 30.0–36.0)
MCV: 98.6 fL (ref 80.0–100.0)
Monocytes Absolute: 0.3 10*3/uL (ref 0.1–1.0)
Monocytes Relative: 7 %
Neutro Abs: 2.9 10*3/uL (ref 1.7–7.7)
Neutrophils Relative %: 61 %
Platelets: 146 10*3/uL — ABNORMAL LOW (ref 150–400)
RBC: 5.1 MIL/uL (ref 4.22–5.81)
RDW: 13.9 % (ref 11.5–15.5)
WBC: 4.8 10*3/uL (ref 4.0–10.5)
nRBC: 0 % (ref 0.0–0.2)

## 2020-12-07 LAB — BASIC METABOLIC PANEL
Anion gap: 6 (ref 5–15)
BUN: 17 mg/dL (ref 8–23)
CO2: 29 mmol/L (ref 22–32)
Calcium: 8.9 mg/dL (ref 8.9–10.3)
Chloride: 103 mmol/L (ref 98–111)
Creatinine, Ser: 1.08 mg/dL (ref 0.61–1.24)
GFR, Estimated: >60 mL/min (ref >60)
Glucose, Bld: 94 mg/dL (ref 70–99)
Potassium: 4.2 mmol/L (ref 3.5–5.1)
Sodium: 138 mmol/L (ref 135–145)

## 2020-12-07 LAB — SEDIMENTATION RATE: Sed Rate: 1 mm/hr (ref 0–16)

## 2020-12-07 LAB — BRAIN NATRIURETIC PEPTIDE: B Natriuretic Peptide: 61 pg/mL (ref 0.0–100.0)

## 2020-12-07 LAB — TSH: TSH: 2.869 u[IU]/mL (ref 0.350–4.500)

## 2020-12-07 MED ORDER — AMOXICILLIN-POT CLAVULANATE 875-125 MG PO TABS: 1.0000 | ORAL_TABLET | Freq: Two times a day (BID) | ORAL | 0 refills | Status: AC

## 2020-12-07 NOTE — Progress Notes (Signed)
Devin Holter Sr., male    DOB: September 02, 1946,    MRN: [id number]   Brief patient profile:  48 yow never smoker with onset doe in the 1980s and gradually worse until ? covid 11/2018 (not cofirmed) > Dr Willey Blade rx included  Prednisone/abx > better breathing for months but since early 2021 worse again so referred to pulmonary clinic in [place]  12/07/2020 by Dr Willey Blade     History of Present Illness  12/07/2020  Pulmonary/ 1st office eval/ [name] / [name]  Chief Complaint  Patient presents with  . 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     Also denies any obvious fluctuation of symptoms with weather or environmental changes or other aggravating or alleviating factors except as outlined above   No unusual exposure hx or h/o childhood pna/ asthma or knowledge of premature birth.  Current Allergies, Complete Past Medical History, Past Surgical History, Family History, and Social History were reviewed in [employer] record.  ROS  The following are not active complaints unless bolded Hoarseness, sore throat, dysphagia, dental problems, itching, sneezing,  nasal congestion or discharge of excess mucus or purulent secretions, R ear ache,   fever, chills, sweats, unintended wt loss or wt gain, classically pleuritic or exertional cp,  orthopnea pnd or arm/hand swelling  or leg swelling, presyncope, palpitations, abdominal pain, anorexia, nausea, vomiting, diarrhea  or change in  bowel habits or change in bladder habits, change in stools or change in urine, dysuria, hematuria,  rash, arthralgias, visual complaints, headache, numbness, weakness or ataxia or problems with walking or coordination,  change in mood or  memory.           Past Medical History:  Diagnosis Date  . Arthritis   . Asbestosis ([place])   . 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  . Essential hypertension   . GERD (gastroesophageal reflux disease)   . Melanoma of back ([place])   . Prostatic hypertrophy   . Sleep apnea    Intolerant of CPAP    Outpatient Medications Prior to Visit  Medication Sig Dispense Refill  . aspirin 81 MG tablet Take 1 tablet (81 mg total) by mouth daily with breakfast. For Heart 30 tablet 5  . atorvastatin (LIPITOR) 10 MG tablet Take 10 mg by mouth daily.    . clonazePAM (KLONOPIN) 0.5 MG tablet 0.5 mg at bedtime.     . cyanocobalamin (,VITAMIN B-12,) 1000 MCG/ML injection INJECT 1ML AS DIRECTED EVERY 4 WEEKS    . doxazosin (CARDURA) 4 MG tablet Take 1 tablet (4 mg total) by mouth daily. 90 tablet 3  . metoprolol succinate (TOPROL XL) 25 MG 24 hr tablet Take 1 tablet (25 mg total) by mouth daily. For Heart 30 tablet 11  . ondansetron (ZOFRAN) 4 MG  tablet Take 1 tablet (4 mg total) by mouth every 6 (six) hours as needed for nausea. 20 tablet 0  . PROAIR HFA 108 (90 Base) MCG/ACT inhaler Inhale 2 puffs into the lungs every 4 (four) hours as needed for wheezing or shortness of breath. 18 g 1  . testosterone cypionate (DEPOTESTOSTERONE CYPIONATE) 200 MG/ML injection Inject 2 mLs (400 mg total) into the muscle every 14 (fourteen) days. 10 mL 2  . torsemide (DEMADEX) 20 MG tablet Take 20 mg by mouth daily.    Marland Kitchen acetaminophen (TYLENOL) 325 MG tablet Take 2 tablets (650 mg total) by mouth every 6 (six) hours as needed for mild pain (or Fever >/= 101). (Patient not taking: Reported on 12/07/2020) 15  tablet 0  . cyanocobalamin 1000 MCG tablet Take 1,000 mcg by mouth daily.    Noelle Penner ASPIRIN ADULT LOW DOSE 81 MG EC tablet Take 81 mg by mouth every morning.     No facility-administered medications prior to visit.     Objective:     BP 140/80 (BP Location: Left Arm, Cuff Size: Normal)   Pulse 76   Temp 98.7 F (37.1 C) (Temporal)   Ht 6' (1.829 m)   Wt 287 lb 12.8 oz (130.5 kg)   SpO2 97% Comment: Room air  BMI 39.03 kg/m   SpO2: 97 % (Room air)   Obese wm nad    HEENT : pt wearing mask not removed for exam due to covid -19 concerns.    NECK :  without JVD/Nodes/TM/ nl carotid upstrokes bilaterally   LUNGS: no acc muscle use,  Nl contour chest which is clear to A and P bilaterally without cough on insp or exp maneuvers   CV:  RRR  no s3 or murmur or increase in P2, and 1+ pitting both LE's  ABD:  Obese soft and nontender with nl inspiratory excursion in the supine position. No bruits or organomegaly appreciated, bowel sounds nl  MS:  Nl gait/ ext warm without deformities, calf tenderness, cyanosis or clubbing No obvious joint restrictions   SKIN: warm and dry without lesions    NEURO:  alert, approp, nl sensorium with  no motor or cerebellar deficits apparent.    I personally reviewed images and agree with radiology impression as follows:  Chest CT s contrast 09/07/20 No CT evidence of pleural or pulmonary malignancy. No fibrosis.   Labs ordered/ reviewed:      Chemistry      Component Value Date/Time   NA 138 12/07/2020 1059   K 4.2 12/07/2020 1059   CL 103 12/07/2020 1059   CO2 29 12/07/2020 1059   BUN 17 12/07/2020 1059   CREATININE 1.08 12/07/2020 1059   CREATININE 0.98 01/15/2015 1454      Component Value Date/Time   CALCIUM 8.9 12/07/2020 1059   ALKPHOS 48 04/08/2020 0842   AST 35 04/08/2020 0842   ALT 43 04/08/2020 0842   BILITOT 1.1 04/08/2020 0842        Lab Results  Component Value Date   WBC 4.8 12/07/2020   HGB 16.1 12/07/2020    HCT 50.3 12/07/2020   MCV 98.6 12/07/2020   PLT 146 (L) 12/07/2020       EOS  0.2                                     12/07/2020       Lab Results  Component Value Date   TSH 2.869 12/07/2020    BNP           61.0          12/07/2020      Lab Results  Component Value Date   ESRSEDRATE 1 12/07/2020              Assessment   No problem-specific Assessment & Plan notes found for this encounter.     Christinia Gully, MD 12/07/2020

## 2020-12-07 NOTE — Patient Instructions (Addendum)
Augmentin 875 mg take one pill twice daily  X 10 days - take at breakfast and supper with large glass of water.  It would help reduce the usual side effects (diarrhea and yeast infections) if you ate cultured yogurt at lunch.   Call if cough/ congestion not better after the augmentin and we can do sinus CT   For cough / congestion > mucinex 1200 mg every 12 hours over the counter and flutter valve as much as possible   GERD (REFLUX)  is an extremely common cause of respiratory symptoms just like yours , many times with no obvious heartburn at all.    It can be treated with medication, but also with lifestyle changes including elevation of the head of your bed (ideally with 6 -8inch blocks under the headboard of your bed),  Smoking cessation, avoidance of late meals, excessive alcohol, and avoid fatty foods, chocolate, peppermint, colas, red wine, and acidic juices such as orange juice.  NO MINT OR MENTHOL PRODUCTS SO NO COUGH DROPS  USE SUGARLESS CANDY INSTEAD (Jolley ranchers or Stover's or Life Savers) or even ice chips will also do - the key is to swallow to prevent all throat clearing. NO OIL BASED VITAMINS - use powdered substitutes.  Avoid fish oil when coughing.   Please remember to go to the lab department @ [facility] for your tests - we will call you with the results when they are available.   I very strongly recommend you get the moderna or pfizer vaccine as soon as possible based on your risk of dying from the virus  and the proven safety and benefit of these vaccines against even the delta and omicron variants.  This can save your life as well as  those of your loved ones,  especially if they are also not vaccinated.     Please schedule a follow up visit in 3 months but call sooner if needed

## 2020-12-07 NOTE — Assessment & Plan Note (Signed)
Onset 1980s with background of asbestos exp - CT s contrast 09/07/20  No CT evidence of pleural or pulmonary malignancy. No fibrosis -  12/07/2020   Walked RA  approx   600 ft  @ moderate pace  stopped due to  End of study with sats 95% at end      Symptoms are markedly disproportionate to objective findings and not clear to what extent this is actually a pulmonary  problem but pt does appear to have difficult to sort out respiratory symptoms of unknown origin for which  DDX  = almost all start with A and  include Adherence, Ace Inhibitors, Acid Reflux, Active Sinus Disease, Alpha 1 Antitripsin deficiency, Anxiety masquerading as Airways dz,  ABPA,  Allergy(esp in young), Aspiration (esp in elderly), Adverse effects of meds,  Active smoking or Vaping, A bunch of PE's/clot burden (a few small clots can't cause this syndrome unless there is already severe underlying pulm or vascular dz with poor reserve),  Anemia or thyroid disorder, plus two Bs  = Bronchiectasis and Beta blocker use..and one C= CHF     Adherence is always the initial "prime suspect" and is a multilayered concern that requires a "trust but verify" approach in every patient - starting with knowing how to use medications, especially inhalers, correctly, keeping up with refills and understanding the fundamental difference between maintenance and prns vs those medications only taken for a very short course and then stopped and not refilled.   Active sinus dz >  Try augmentin then ct sinus or ent eval if not improving.   ? Acid (or non-acid) GERD > always difficult to exclude as up to 75% of pts in some series report no assoc GI/ Heartburn symptoms> rec max (  diet restrictions/ reviewed and instructions given in writing - hold off for now on empirical rx with ppi  ? Anxiety/depression/ deconditioning  > usually at the bottom of this list of usual suspects but  may interfere with adherence and also interpretation of response or lack thereof to  symptom management which can be quite subjective.  ? Alpha one def > check phenotype  ? Bronchiectasis > consider HRCT next    ? chf > bnp rules against though can be false neg in [place]    Patient is not fully  vaccinated and was informed of the seriousness of COVID 19 infection as a direct risk to lung health as well as safety to close contacts and should continue to wear a facemask in public and minimize exposure to public locations but especially avoid any area or activity where non-close contacts are not observing distancing or wearing an appropriate face mask.  I strongly recommended pt take either of the vaccines available through local drugstores based on updated information on millions of Americans treated with the Delevan products  which have proven both safe and  effective even against the  delta and new omicron variant to prevent hospitalization and death.   >>> check Ig ab's to help with encouraging completing moderna as he had age and [place] as major risk factors   Each maintenance medication was reviewed in detail including emphasizing most importantly the difference between maintenance and prns and under what circumstances the prns are to be triggered using an action plan format where appropriate.  Total time for H and P, chart review, counseling directly observing portions of ambulatory 02 saturation study/ and generating customized AVS unique to this office visit / same day charting =  51 min

## 2020-12-09 LAB — ALPHA-1 ANTITRYPSIN PHENOTYPE: A-1 Antitrypsin, Ser: 134 mg/dL (ref 101–187)

## 2020-12-11 LAB — IGE: IgE (Immunoglobulin E), Serum: 35 IU/mL (ref 6–495)

## 2020-12-13 ENCOUNTER — Encounter: Payer: Self-pay | Admitting: *Deleted

## 2020-12-27 DIAGNOSIS — D519 Vitamin B12 deficiency anemia, unspecified: Secondary | ICD-10-CM | POA: Diagnosis not present

## 2021-01-03 DIAGNOSIS — E785 Hyperlipidemia, unspecified: Secondary | ICD-10-CM | POA: Diagnosis not present

## 2021-01-03 DIAGNOSIS — J660 Byssinosis: Secondary | ICD-10-CM | POA: Diagnosis not present

## 2021-01-03 DIAGNOSIS — Z79899 Other long term (current) drug therapy: Secondary | ICD-10-CM | POA: Diagnosis not present

## 2021-01-03 DIAGNOSIS — I2510 Atherosclerotic heart disease of native coronary artery without angina pectoris: Secondary | ICD-10-CM | POA: Diagnosis not present

## 2021-01-03 DIAGNOSIS — D696 Thrombocytopenia, unspecified: Secondary | ICD-10-CM | POA: Diagnosis not present

## 2021-01-03 DIAGNOSIS — Z125 Encounter for screening for malignant neoplasm of prostate: Secondary | ICD-10-CM | POA: Diagnosis not present

## 2021-01-03 DIAGNOSIS — M109 Gout, unspecified: Secondary | ICD-10-CM | POA: Diagnosis not present

## 2021-01-10 DIAGNOSIS — E291 Testicular hypofunction: Secondary | ICD-10-CM | POA: Diagnosis not present

## 2021-01-10 DIAGNOSIS — J61 Pneumoconiosis due to asbestos and other mineral fibers: Secondary | ICD-10-CM | POA: Diagnosis not present

## 2021-01-10 DIAGNOSIS — E785 Hyperlipidemia, unspecified: Secondary | ICD-10-CM | POA: Diagnosis not present

## 2021-01-10 DIAGNOSIS — I4510 Unspecified right bundle-branch block: Secondary | ICD-10-CM | POA: Diagnosis not present

## 2021-01-10 DIAGNOSIS — I2510 Atherosclerotic heart disease of native coronary artery without angina pectoris: Secondary | ICD-10-CM | POA: Diagnosis not present

## 2021-01-10 DIAGNOSIS — Z6835 Body mass index (BMI) 35.0-35.9, adult: Secondary | ICD-10-CM | POA: Diagnosis not present

## 2021-01-10 DIAGNOSIS — I10 Essential (primary) hypertension: Secondary | ICD-10-CM | POA: Diagnosis not present

## 2021-01-24 ENCOUNTER — Telehealth: Payer: Self-pay

## 2021-01-24 ENCOUNTER — Other Ambulatory Visit: Payer: Self-pay

## 2021-01-24 DIAGNOSIS — E291 Testicular hypofunction: Secondary | ICD-10-CM | POA: Diagnosis not present

## 2021-01-24 MED ORDER — DOXAZOSIN MESYLATE 4 MG PO TABS: 4.0000 mg | ORAL_TABLET | Freq: Two times a day (BID) | ORAL | 3 refills | Status: DC

## 2021-01-24 NOTE — Telephone Encounter (Signed)
Pts wife called saying Dr. Jeffie Pollock had sent in a prescription for Doxazosin wrong ( prescription read to take 1 tab daily) and it should have said 1 tab in the morning and 1 tab at bedtime for a total of 8 mg per day. I looked at drs note and in subjective it was written as pts wife said that pt was taking 2 tabs per day for a total of 8 mgs . Also in plan and assessment part of note said same. I talked with A.L. RN and she ok just to change.

## 2021-02-09 DIAGNOSIS — Z79899 Other long term (current) drug therapy: Secondary | ICD-10-CM | POA: Diagnosis not present

## 2021-02-09 DIAGNOSIS — E785 Hyperlipidemia, unspecified: Secondary | ICD-10-CM | POA: Diagnosis not present

## 2021-02-09 DIAGNOSIS — E291 Testicular hypofunction: Secondary | ICD-10-CM | POA: Diagnosis not present

## 2021-02-09 DIAGNOSIS — I10 Essential (primary) hypertension: Secondary | ICD-10-CM | POA: Diagnosis not present

## 2021-02-09 DIAGNOSIS — I2510 Atherosclerotic heart disease of native coronary artery without angina pectoris: Secondary | ICD-10-CM | POA: Diagnosis not present

## 2021-02-09 DIAGNOSIS — D649 Anemia, unspecified: Secondary | ICD-10-CM | POA: Diagnosis not present

## 2021-02-11 ENCOUNTER — Other Ambulatory Visit: Payer: Self-pay | Admitting: Urology

## 2021-02-11 ENCOUNTER — Telehealth: Payer: Self-pay | Admitting: Urology

## 2021-02-11 NOTE — Telephone Encounter (Signed)
Pts wife called and message left sending to provider to be filled. Will call when doctor sends.

## 2021-02-11 NOTE — Telephone Encounter (Signed)
Pt's wife called and said that he needs a refill on his testosterone cypionate. Please let pt know when it is sent in. Thanks!

## 2021-02-13 NOTE — Telephone Encounter (Signed)
sent 

## 2021-02-14 ENCOUNTER — Telehealth: Payer: Self-pay

## 2021-02-14 NOTE — Telephone Encounter (Signed)
Called pts wife back and said Dr. Jeffie Pollock sent in prescription on 4/17. Wife said she will call pharmacy.

## 2021-02-14 NOTE — Telephone Encounter (Signed)
Patient's wife, Sunday Spillers calling to find out why patient's testosterone cypionate (DEPOTESTOSTERONE CYPIONATE) 200 MG/ML injection will not be refilled.  Please advise.  Call back:  [phone]  Thanks, Helene Kelp

## 2021-02-16 DIAGNOSIS — R609 Edema, unspecified: Secondary | ICD-10-CM | POA: Diagnosis not present

## 2021-02-16 DIAGNOSIS — I10 Essential (primary) hypertension: Secondary | ICD-10-CM | POA: Diagnosis not present

## 2021-02-21 DIAGNOSIS — L570 Actinic keratosis: Secondary | ICD-10-CM | POA: Diagnosis not present

## 2021-02-21 DIAGNOSIS — Z1283 Encounter for screening for malignant neoplasm of skin: Secondary | ICD-10-CM | POA: Diagnosis not present

## 2021-02-21 DIAGNOSIS — Z85820 Personal history of malignant melanoma of skin: Secondary | ICD-10-CM | POA: Diagnosis not present

## 2021-02-21 DIAGNOSIS — L910 Hypertrophic scar: Secondary | ICD-10-CM | POA: Diagnosis not present

## 2021-03-14 DIAGNOSIS — E291 Testicular hypofunction: Secondary | ICD-10-CM | POA: Diagnosis not present

## 2021-03-30 NOTE — Progress Notes (Addendum)
Cardiology Office Note  Date: 03/30/2021   ID: Devin Holter Sr., DOB May 06, 1946, MRN [id number]  PCP:  [name], MD  Cardiologist:  Rozann Lesches, MD Electrophysiologist:  None   Chief Complaint: 1 year follow-up  History of Present Illness: Devin Foister Sr. is a 75 y.o. male with a history of CAD status post CABG x4, HTN, OSA, asbestosis, GERD, chronic diastolic heart failure, atrial fibrillation, mixed hyperlipidemia, bilateral leg edema.  He was last seen by Dr. Domenic Polite on 12/16/2019 for routine visit.  He reported no anginal symptoms or nitroglycerin use.  He had chronic stable dyspnea on exertion with history of asbestosis along with reactive airway disease.  He was seeing a pulmonologist in [place] once a year.  He was continuing with chronic leg swelling using Demadex 10 mg a day.  Previous echocardiogram in February 2020 showed EF of 55 to 60% with low normal RV contraction.  His current cardiac regimen included aspirin, Lipitor, Demadex.  He was on low-dose Lipitor and having some leg pain.  He stopped the medication for few days and felt better.  Dr. Domenic Polite asked him to talk to his PCP about possibly considering switch to low-dose Crestor if lipids were okay.  Blood pressure was currently well controlled and no changes were made to current regimen.   He is here today for 1 year follow-up.  He states he had COVID infection twice in the last 30 years.  States he had his gallbladder out last year.  Otherwise he denies any recent issues.  He denies any anginal symptoms or nitroglycerin use.  His chronic dyspnea secondary to asbestosis and reactive airway disease is stable.  He states he is only been taking half a pill of his 20 mg torsemide on days when he is active due to need for frequent urination.  He states he takes the full 20 mg on days he does not have to travel.  His weight is staying fairly stable at 283.  He states his weight fluctuates a lot depending on how  much torsemide he takes.  He denies any SOB or DOE currently.  Denies any orthostatic symptoms, palpitations or arrhythmias, CVA or TIA-like symptoms, PND, orthopnea, bleeding issues.  Denies any claudication-like symptoms, DVT or PE-like symptoms.  He does have chronic lower extremity edema.  Current cardiac regimen includes aspirin 81 mg daily, atorvastatin 10 mg daily, Toprol-XL 25 mg daily, torsemide 20 mg taking 10 mg daily.   Past Medical History:  Diagnosis Date  . Arthritis   . Asbestosis ([place])   . 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  . Essential hypertension   . GERD (gastroesophageal reflux disease)   . Melanoma of back ([place])   . Prostatic hypertrophy   . Sleep apnea    Intolerant of CPAP    Past Surgical History:  Procedure Laterality Date  . ANTERIOR CERVICAL DECOMP/DISCECTOMY FUSION    . CARDIOVERSION N/A 01/28/2015   Procedure: CARDIOVERSION;  Surgeon: Arnoldo Lenis, MD;  Location: [facility];  Service: Endoscopy;  Laterality: N/A;  . CHOLECYSTECTOMY N/A 04/07/2020   Procedure: LAPAROSCOPIC CHOLECYSTECTOMY;  Surgeon: Virl Cagey, MD;  Location: [facility];  Service: General;  Laterality: N/A;  . CORONARY ARTERY BYPASS GRAFT N/A 12/29/2014   Procedure: CORONARY ARTERY BYPASS GRAFTING (CABG), ON PUMP, TIMES FOUR, USING LEFT INTERNAL MAMMARY ARTERY, RIGHT GREATER SAPHENOUS  VEIN HARVESTED ENDOSCOPICALLY;  Surgeon: Ivin Poot, MD;  Location: [facility];  Service: Open Heart Surgery;  Laterality: N/A;  . KNEE ARTHROSCOPY Right   . KNEE CARTILAGE SURGERY Left   . LEFT HEART CATHETERIZATION WITH CORONARY ANGIOGRAM N/A 12/28/2014   Procedure: LEFT HEART CATHETERIZATION WITH CORONARY ANGIOGRAM;  Surgeon: Lorretta Harp, MD;  Location: [facility] CATH LAB;  Service: Cardiovascular;  Laterality: N/A;  . MELANOMA EXCISION     "back"  . SHOULDER OPEN ROTATOR CUFF REPAIR Left   . TEE WITHOUT  CARDIOVERSION N/A 12/29/2014   Procedure: TRANSESOPHAGEAL ECHOCARDIOGRAM (TEE);  Surgeon: Ivin Poot, MD;  Location: [facility];  Service: Open Heart Surgery;  Laterality: N/A;  . TEE WITHOUT CARDIOVERSION N/A 01/28/2015   Procedure: TRANSESOPHAGEAL ECHOCARDIOGRAM (TEE);  Surgeon: Arnoldo Lenis, MD;  [facility];  Service: Endoscopy;  Laterality: N/A;    Current Outpatient Medications  Medication Sig Dispense Refill  . acetaminophen (TYLENOL) 325 MG tablet Take 2 tablets (650 mg total) by mouth every 6 (six) hours as needed for mild pain (or Fever >/= 101). (Patient not taking: Reported on 12/07/2020) 15 tablet 0  . aspirin 81 MG tablet Take 1 tablet (81 mg total) by mouth daily with breakfast. For Heart 30 tablet 5  . atorvastatin (LIPITOR) 10 MG tablet Take 10 mg by mouth daily.    . clonazePAM (KLONOPIN) 0.5 MG tablet 0.5 mg at bedtime.     . cyanocobalamin (,VITAMIN B-12,) 1000 MCG/ML injection INJECT 1ML AS DIRECTED EVERY 4 WEEKS    . doxazosin (CARDURA) 4 MG tablet Take 1 tablet (4 mg total) by mouth in the morning and at bedtime. 90 tablet 3  . metoprolol succinate (TOPROL XL) 25 MG 24 hr tablet Take 1 tablet (25 mg total) by mouth daily. For Heart 30 tablet 11  . ondansetron (ZOFRAN) 4 MG tablet Take 1 tablet (4 mg total) by mouth every 6 (six) hours as needed for nausea. 20 tablet 0  . PROAIR HFA 108 (90 Base) MCG/ACT inhaler Inhale 2 puffs into the lungs every 4 (four) hours as needed for wheezing or shortness of breath. 18 g 1  . testosterone cypionate (DEPOTESTOSTERONE CYPIONATE) 200 MG/ML injection ADMINISTER 2 ML(400 MG) IN THE MUSCLE EVERY 14 DAYS 10 mL 0  . torsemide (DEMADEX) 20 MG tablet Take 20 mg by mouth daily.     No current facility-administered medications for this visit.   Allergies:  Lasix [furosemide]   Social History: The patient  reports that he has never smoked. He has never used smokeless tobacco. He reports current alcohol use. He reports that he does not  use drugs.   Family History: The patient's family history includes Heart disease in his brother and sister; Hypertension in his mother; Stomach cancer in his sister; Stroke in his mother.   ROS:  Please see the history of present illness. Otherwise, complete review of systems is positive for none.  All other systems are reviewed and negative.   Physical Exam: VS:  There were no vitals taken for this visit., BMI There is no height or weight on file to calculate BMI.  Wt Readings from Last 3 Encounters:  12/07/20 287 lb 12.8 oz (130.5 kg)  11/04/20 280 lb (127 kg)  04/30/20 271 lb (122.9 kg)    General: Morbidly obese patient appears comfortable at rest. Neck: Supple, no elevated JVP or carotid bruits, no thyromegaly. Lungs: Clear to auscultation, nonlabored breathing at rest. Cardiac: Regular rate and rhythm, no  S3 or significant systolic murmur, no pericardial rub. Extremities: Mild non pitting edema, distal pulses 2+. Skin: Warm and dry. Musculoskeletal: No kyphosis. Neuropsychiatric: Alert and oriented x3, affect grossly appropriate.  ECG:   EKG today 03/31/2021 shows normal sinus rhythm left axis deviation, right bundle blanch block rate of 70.  Recent Labwork: 04/08/2020: ALT 43; AST 35 12/07/2020: B Natriuretic Peptide 61.0; BUN 17; Creatinine, Ser 1.08; Hemoglobin 16.1; Platelets 146; Potassium 4.2; Sodium 138; TSH 2.869     Component Value Date/Time   CHOL 171 12/26/2014 0245   TRIG 120 12/26/2014 0245   HDL 31 (L) 12/26/2014 0245   CHOLHDL 5.5 12/26/2014 0245   VLDL 24 12/26/2014 0245   LDLCALC 116 (H) 12/26/2014 0245    Other Studies Reviewed Today:   Echocardiogram 12/20/2018: 1. The left ventricle has normal systolic function, with an ejection  fraction of 55-60%. The cavity size was normal. There is mildly increased  left ventricular wall thickness. Left ventricular diastolic parameters  were normal.  2. The right ventricle has low normal systolic function. The  cavity was  mildly enlarged. There is no increase in right ventricular wall thickness.  3. The mitral valve is normal in structure. Moderate thickening of the  mitral valve leaflet. No evidence of mitral valve stenosis.  4. The tricuspid valve is normal in structure.  5. The aortic valve is tricuspid Moderate thickening of the aortic valve  no stenosis of the aortic valve.  6. The aortic root is normal in size and structure.  7. The inferior vena cava was dilated in size with >50% respiratory  variabili  Assessment and Plan:  1. CAD in native artery   2. Mixed hyperlipidemia   3. Chronic dyspnea   4. Bilateral leg edema   5. Essential hypertension    1. CAD in native artery Denies any anginal or exertional symptoms.  Continue aspirin 81 mg daily, Toprol-XL 25 mg daily  2. Mixed hyperlipidemia Continue atorvastatin 10 mg daily.  Patient states he had recent lipid panel from PCP which he states looked good.  We do not have those results.  We will attempt to obtain labs from PCP  3. Chronic dyspnea Continues with mild chronic dyspnea secondary to asbestosis and reactive airway disease.  Last seen by Dr. [name] pulmonology.  He takes ProAir HFA/albuterol inhaler as needed for shortness of breath.  4. Bilateral leg edema Has chronic mild lower extremity edema.  Continues taking torsemide 10 mg daily.  5. Essential hypertension Blood pressure reasonably controlled at 138/78 today.  Continue Toprol-XL 25 mg daily, torsemide 10 mg daily.  Medication Adjustments/Labs and Tests Ordered: Current medicines are reviewed at length with the patient today.  Concerns regarding medicines are outlined above.   Disposition: Follow-up with Dr. Domenic Polite or APP 1 year  Signed, Levell July, NP 03/30/2021 2:57 PM    [facility] at [place], [place],  [address] Phone: [phone]; Fax: [phone]

## 2021-03-31 ENCOUNTER — Ambulatory Visit (INDEPENDENT_AMBULATORY_CARE_PROVIDER_SITE_OTHER): Payer: Medicare Other | Admitting: Family Medicine

## 2021-03-31 ENCOUNTER — Encounter: Payer: Self-pay | Admitting: Family Medicine

## 2021-03-31 VITALS — BP 138/78 | HR 73 | Ht 72.0 in | Wt 283.0 lb

## 2021-03-31 DIAGNOSIS — I10 Essential (primary) hypertension: Secondary | ICD-10-CM

## 2021-03-31 DIAGNOSIS — R0609 Other forms of dyspnea: Secondary | ICD-10-CM

## 2021-03-31 DIAGNOSIS — R600 Localized edema: Secondary | ICD-10-CM | POA: Diagnosis not present

## 2021-03-31 DIAGNOSIS — I2510 Atherosclerotic heart disease of native coronary artery without angina pectoris: Secondary | ICD-10-CM | POA: Diagnosis not present

## 2021-03-31 DIAGNOSIS — E782 Mixed hyperlipidemia: Secondary | ICD-10-CM

## 2021-03-31 NOTE — Patient Instructions (Addendum)

## 2021-04-28 ENCOUNTER — Other Ambulatory Visit: Payer: Medicare Other

## 2021-04-28 ENCOUNTER — Other Ambulatory Visit: Payer: Self-pay

## 2021-04-28 DIAGNOSIS — N401 Enlarged prostate with lower urinary tract symptoms: Secondary | ICD-10-CM

## 2021-04-28 DIAGNOSIS — E291 Testicular hypofunction: Secondary | ICD-10-CM | POA: Diagnosis not present

## 2021-04-29 LAB — TESTOSTERONE: Testosterone: 914 ng/dL (ref 264–916)

## 2021-04-29 LAB — HEMOGLOBIN AND HEMATOCRIT, BLOOD
Hematocrit: 51.3 % — ABNORMAL HIGH (ref 37.5–51.0)
Hemoglobin: 16.6 g/dL (ref 13.0–17.7)

## 2021-05-03 ENCOUNTER — Telehealth: Payer: Self-pay

## 2021-05-03 NOTE — Telephone Encounter (Signed)
Called patient. No answer. Left message to return call.

## 2021-05-03 NOTE — Telephone Encounter (Signed)
-----   Message from Irine Seal, MD sent at 04/29/2021 10:06 AM EDT ----- His Hgb continues to creep up on the testosterone therapy.  If he is willing, he needs to go an donate blood to get the level down.  His level is not too high but I want to make sure we stay ahead of this.  ----- Message ----- From: Dorisann Frames, RN Sent: 04/29/2021   9:19 AM EDT To: Irine Seal, MD  Appt 07/07

## 2021-05-05 ENCOUNTER — Ambulatory Visit: Payer: Medicare Other | Admitting: Urology

## 2021-05-05 NOTE — Telephone Encounter (Signed)
Patient left voicemail of returning call. Called patient back. No answer [id number]

## 2021-05-11 DIAGNOSIS — Z79899 Other long term (current) drug therapy: Secondary | ICD-10-CM | POA: Diagnosis not present

## 2021-05-11 DIAGNOSIS — I10 Essential (primary) hypertension: Secondary | ICD-10-CM | POA: Diagnosis not present

## 2021-05-11 DIAGNOSIS — R609 Edema, unspecified: Secondary | ICD-10-CM | POA: Diagnosis not present

## 2021-05-18 DIAGNOSIS — I2510 Atherosclerotic heart disease of native coronary artery without angina pectoris: Secondary | ICD-10-CM | POA: Diagnosis not present

## 2021-05-18 DIAGNOSIS — I10 Essential (primary) hypertension: Secondary | ICD-10-CM | POA: Diagnosis not present

## 2021-05-18 DIAGNOSIS — R609 Edema, unspecified: Secondary | ICD-10-CM | POA: Diagnosis not present

## 2021-05-23 NOTE — Telephone Encounter (Signed)
Unable to reach patient by phone on 3rd attempt. Letter generated and mailed.

## 2021-05-23 NOTE — Progress Notes (Signed)
Unable to reach patient by phone on multiple attempts.  Letter mailed.

## 2021-06-01 ENCOUNTER — Telehealth: Payer: Self-pay | Admitting: Urology

## 2021-06-01 NOTE — Telephone Encounter (Signed)
Pt's wife, Sunday Spillers, called req'n a refill on his testosterone to be sent to [facility] in [place], [place]), plz.  She states they have sent over a request but there has been no response from the office to date.  Please note:  the pt is requesting the 10 ml bottle & not 10 1 ml bottles, please.  Thank you

## 2021-06-02 ENCOUNTER — Other Ambulatory Visit: Payer: Self-pay | Admitting: Urology

## 2021-06-02 MED ORDER — TESTOSTERONE CYPIONATE 200 MG/ML IM SOLN: INTRAMUSCULAR | 1 refills | Status: DC

## 2021-06-02 NOTE — Telephone Encounter (Signed)
See below

## 2021-06-02 NOTE — Telephone Encounter (Signed)
Patient called and made aware.

## 2021-06-09 ENCOUNTER — Telehealth: Payer: Self-pay

## 2021-06-09 ENCOUNTER — Other Ambulatory Visit: Payer: Self-pay | Admitting: Urology

## 2021-06-09 MED ORDER — TESTOSTERONE CYPIONATE 200 MG/ML IM SOLN: INTRAMUSCULAR | 1 refills | Status: DC

## 2021-06-09 NOTE — Telephone Encounter (Signed)
See below

## 2021-06-09 NOTE — Telephone Encounter (Signed)
Medication was sent to wrong pharmacy.  testosterone cypionate (DEPOTESTOSTERONE CYPIONATE) 200 MG/ML injection  Please send to:  [facility] [place], [place] AT [address] Phone:  [phone]  Fax:  [phone]     Thanks, Helene Kelp

## 2021-06-30 ENCOUNTER — Other Ambulatory Visit: Payer: Self-pay

## 2021-06-30 ENCOUNTER — Ambulatory Visit (INDEPENDENT_AMBULATORY_CARE_PROVIDER_SITE_OTHER): Payer: Medicare Other | Admitting: Urology

## 2021-06-30 VITALS — BP 150/71 | HR 71

## 2021-06-30 DIAGNOSIS — N138 Other obstructive and reflux uropathy: Secondary | ICD-10-CM

## 2021-06-30 DIAGNOSIS — N5201 Erectile dysfunction due to arterial insufficiency: Secondary | ICD-10-CM

## 2021-06-30 DIAGNOSIS — N401 Enlarged prostate with lower urinary tract symptoms: Secondary | ICD-10-CM

## 2021-06-30 DIAGNOSIS — E291 Testicular hypofunction: Secondary | ICD-10-CM

## 2021-06-30 DIAGNOSIS — R350 Frequency of micturition: Secondary | ICD-10-CM

## 2021-06-30 DIAGNOSIS — I2510 Atherosclerotic heart disease of native coronary artery without angina pectoris: Secondary | ICD-10-CM | POA: Diagnosis not present

## 2021-06-30 DIAGNOSIS — D751 Secondary polycythemia: Secondary | ICD-10-CM | POA: Diagnosis not present

## 2021-06-30 LAB — URINALYSIS, ROUTINE W REFLEX MICROSCOPIC
Bilirubin, UA: NEGATIVE
Glucose, UA: NEGATIVE
Ketones, UA: NEGATIVE
Leukocytes,UA: NEGATIVE
Nitrite, UA: NEGATIVE
Protein,UA: NEGATIVE
RBC, UA: NEGATIVE
Specific Gravity, UA: 1.02 (ref 1.005–1.030)
Urobilinogen, Ur: 0.2 mg/dL (ref 0.2–1.0)
pH, UA: 7 (ref 5.0–7.5)

## 2021-06-30 NOTE — Progress Notes (Signed)
Subjective:  1. BPH with urinary obstruction   2. Hypogonadism in male   3. Urinary frequency   4. Erectile dysfunction due to arterial insufficiency   5. Acquired polycythemia      Mr. Battis returns today in f/u.  He has been on TRT with '400mg'$  IM q2wks.  His T is 914.  His Hgb is 16.6 and his Hct was 51.3.   He was encouraged to donate blood but hasn't done that yet.   He is doing well on therapy.  He has had progressive ED and has not been using the VED.  He is able to function with a partial erection.    He remains on doxazosin '4mg'$  bid for his voiding symptoms which I have refilled.  His IPSS is 3.     His PSA was 1.3 which is stable.       IPSS     Row Name 06/30/21 1600         International Prostate Symptom Score   How often have you had the sensation of not emptying your bladder? Less than 1 in 5     How often have you had to urinate less than every two hours? Less than 1 in 5 times     How often have you found you stopped and started again several times when you urinated? Not at All     How often have you found it difficult to postpone urination? Not at All     How often have you had a weak urinary stream? Not at All     How often have you had to strain to start urination? Not at All     How many times did you typically get up at night to urinate? 1 Time     Total IPSS Score 3           Quality of Life due to urinary symptoms   If you were to spend the rest of your life with your urinary condition just the way it is now how would you feel about that? Mostly Satisfied                ROS:  ROS  Allergies  Allergen Reactions   Lasix [Furosemide] Nausea Only    Outpatient Encounter Medications as of 06/30/2021  Medication Sig Note   aspirin 81 MG tablet Take 1 tablet (81 mg total) by mouth daily with breakfast. For Heart    atorvastatin (LIPITOR) 10 MG tablet Take 10 mg by mouth daily.    clonazePAM (KLONOPIN) 0.5 MG tablet 0.5 mg at bedtime.  02/24/2015:  Received from: External Pharmacy   cyanocobalamin (,VITAMIN B-12,) 1000 MCG/ML injection INJECT 1ML AS DIRECTED EVERY 4 WEEKS 04/06/2020: Was due to take this today   doxazosin (CARDURA) 4 MG tablet Take 1 tablet (4 mg total) by mouth in the morning and at bedtime.    naproxen sodium (ALEVE) 220 MG tablet Take 220 mg by mouth as needed.    PROAIR HFA 108 (90 Base) MCG/ACT inhaler Inhale 2 puffs into the lungs every 4 (four) hours as needed for wheezing or shortness of breath.    testosterone cypionate (DEPOTESTOSTERONE CYPIONATE) 200 MG/ML injection ADMINISTER 2 ML(400 MG) IN THE MUSCLE EVERY 14 DAYS    torsemide (DEMADEX) 20 MG tablet Take 10 mg by mouth daily.    metoprolol succinate (TOPROL XL) 25 MG 24 hr tablet Take 1 tablet (25 mg total) by mouth daily. For Heart    No  facility-administered encounter medications on file as of 06/30/2021.    Past Medical History:  Diagnosis Date   Arthritis    Asbestosis ([place])    CAD (coronary artery disease)    Multivessel status post CABG March 2016 - LIMA to LAD, SVG to OM1, SVG to OM2, and SVG to PDA.   Essential hypertension    GERD (gastroesophageal reflux disease)    Melanoma of back ([facility])    Prostatic hypertrophy    Sleep apnea    Intolerant of CPAP    Past Surgical History:  Procedure Laterality Date   ANTERIOR CERVICAL DECOMP/DISCECTOMY FUSION     CARDIOVERSION N/A 01/28/2015   Procedure: CARDIOVERSION;  Surgeon: Arnoldo Lenis, MD;  Location: [facility];  Service: Endoscopy;  Laterality: N/A;   CHOLECYSTECTOMY N/A 04/07/2020   Procedure: LAPAROSCOPIC CHOLECYSTECTOMY;  Surgeon: Virl Cagey, MD;  Location: [facility];  Service: General;  Laterality: N/A;   CORONARY ARTERY BYPASS GRAFT N/A 12/29/2014   Procedure: CORONARY ARTERY BYPASS GRAFTING (CABG), ON PUMP, TIMES FOUR, USING LEFT INTERNAL MAMMARY ARTERY, RIGHT GREATER SAPHENOUS VEIN HARVESTED ENDOSCOPICALLY;  Surgeon: Ivin Poot, MD;  Location: [facility];  Service: Open Heart Surgery;   Laterality: N/A;   KNEE ARTHROSCOPY Right    KNEE CARTILAGE SURGERY Left    LEFT HEART CATHETERIZATION WITH CORONARY ANGIOGRAM N/A 12/28/2014   Procedure: LEFT HEART CATHETERIZATION WITH CORONARY ANGIOGRAM;  Surgeon: Lorretta Harp, MD;  Location: [facility] CATH LAB;  Service: Cardiovascular;  Laterality: N/A;   MELANOMA EXCISION     "back"   SHOULDER OPEN ROTATOR CUFF REPAIR Left    TEE WITHOUT CARDIOVERSION N/A 12/29/2014   Procedure: TRANSESOPHAGEAL ECHOCARDIOGRAM (TEE);  Surgeon: Ivin Poot, MD;  Location: [facility];  Service: Open Heart Surgery;  Laterality: N/A;   TEE WITHOUT CARDIOVERSION N/A 01/28/2015   Procedure: TRANSESOPHAGEAL ECHOCARDIOGRAM (TEE);  Surgeon: Arnoldo Lenis, MD;  Location: [facility];  Service: Endoscopy;  Laterality: N/A;    Social History   Socioeconomic History   Marital status: Married    Spouse name: Not on file   Number of children: 7   Years of education: Not on file   Highest education level: Not on file  Occupational History   Not on file  Tobacco Use   Smoking status: Never   Smokeless tobacco: Never  Vaping Use   Vaping Use: Never used  Substance and Sexual Activity   Alcohol use: Yes    Alcohol/week: 0.0 standard drinks    Comment: Occasional   Drug use: No   Sexual activity: Yes    Partners: Female  Other Topics Concern   Not on file  Social History Narrative   Lives at home with wife.  They have seven children.     Social Determinants of Health   Financial Resource Strain: Not on file  Food Insecurity: Not on file  Transportation Needs: Not on file  Physical Activity: Not on file  Stress: Not on file  Social Connections: Not on file  Intimate Partner Violence: Not on file    Family History  Problem Relation Age of Onset   Stroke Mother    Hypertension Mother    Heart disease Sister        Died from complications of RHD   Heart disease Brother        Heart failure related to EtOH and smoking   Stomach cancer Sister         Objective: Vitals:   06/30/21 1544  BP: (!) 150/71  Pulse: 71     Physical Exam  Recent Results (from the past 2160 hour(s))  Testosterone     Status: None   Collection Time: 04/28/21  9:10 AM  Result Value Ref Range   Testosterone 914 264 - 916 ng/dL    Comment: Adult male reference interval is based on a population of healthy nonobese males (BMI <30) between 56 and 32 years old. [place], [place] [phone]. PMID: [id number].   Hemoglobin and hematocrit, blood     Status: Abnormal   Collection Time: 04/28/21  9:10 AM  Result Value Ref Range   Hemoglobin 16.6 13.0 - 17.7 g/dL   Hematocrit 51.3 (H) 37.5 - 51.0 %   Lab Results  Component Value Date   PSA1 1.3 10/27/2020       Studies/Results:     Assessment & Plan: Hypogonadism.  His is doing well on current therapy.  Med refilled on 06/09/21.  F/U in 6 months with labs.  Secondary Polycythemia.  His Hgb is normal but creeping up.  I have recommended he donate blood.   BPH with BOO.  He is doing well on doxazosin and his script is current.   His prostate is 56m without a middle lobe.   ED.  He is getting by on no treatment.     Orders Placed This Encounter  Procedures   Urinalysis, Routine w reflex microscopic   Testosterone    Standing Status:   Future    Standing Expiration Date:   06/30/2022   Hemoglobin and hematocrit, blood    Standing Status:   Future    Standing Expiration Date:   06/30/2022   PSA, total and free    Standing Status:   Future    Standing Expiration Date:   06/30/2022    Return in about 6 months (around 12/28/2021) for with labs. .   CC: FAsencion Noble MD       JIrine Seal9/10/2020 Patient ID: JBoris Sharper, male   DOB: [birth year] 75y.o.   MRN: [id number]

## 2021-06-30 NOTE — Progress Notes (Signed)
Urological Symptom Review  Patient is experiencing the following symptoms: Frequent urination Get up at night to urinate Erection problems (male only)   Review of Systems  Gastrointestinal (upper)  : Negative for upper GI symptoms  Gastrointestinal (lower) : Constipation  Constitutional : Negative for symptoms  Skin: Itching  Eyes: Negative for eye symptoms  Ear/Nose/Throat : Negative for Ear/Nose/Throat symptoms  Hematologic/Lymphatic: Negative for Hematologic/Lymphatic symptoms  Cardiovascular : Leg swelling  Respiratory : Cough Shortness of breath  Endocrine: Negative for endocrine symptoms  Musculoskeletal: Joint pain  Neurological: Negative for neurological symptoms  Psychologic: Negative for psychiatric symptoms

## 2021-07-26 ENCOUNTER — Other Ambulatory Visit: Payer: Self-pay | Admitting: Urology

## 2021-08-03 DIAGNOSIS — Z23 Encounter for immunization: Secondary | ICD-10-CM | POA: Diagnosis not present

## 2021-08-18 DIAGNOSIS — Z79899 Other long term (current) drug therapy: Secondary | ICD-10-CM | POA: Diagnosis not present

## 2021-08-18 DIAGNOSIS — R600 Localized edema: Secondary | ICD-10-CM | POA: Diagnosis not present

## 2021-08-18 DIAGNOSIS — I2510 Atherosclerotic heart disease of native coronary artery without angina pectoris: Secondary | ICD-10-CM | POA: Diagnosis not present

## 2021-08-18 DIAGNOSIS — I10 Essential (primary) hypertension: Secondary | ICD-10-CM | POA: Diagnosis not present

## 2021-08-25 DIAGNOSIS — J61 Pneumoconiosis due to asbestos and other mineral fibers: Secondary | ICD-10-CM | POA: Diagnosis not present

## 2021-08-25 DIAGNOSIS — I10 Essential (primary) hypertension: Secondary | ICD-10-CM | POA: Diagnosis not present

## 2021-12-22 ENCOUNTER — Other Ambulatory Visit: Payer: Medicare Other

## 2021-12-22 ENCOUNTER — Other Ambulatory Visit: Payer: Self-pay

## 2021-12-22 DIAGNOSIS — D751 Secondary polycythemia: Secondary | ICD-10-CM | POA: Diagnosis not present

## 2021-12-22 DIAGNOSIS — N401 Enlarged prostate with lower urinary tract symptoms: Secondary | ICD-10-CM | POA: Diagnosis not present

## 2021-12-22 DIAGNOSIS — N138 Other obstructive and reflux uropathy: Secondary | ICD-10-CM

## 2021-12-22 DIAGNOSIS — E291 Testicular hypofunction: Secondary | ICD-10-CM | POA: Diagnosis not present

## 2021-12-23 LAB — HEMOGLOBIN AND HEMATOCRIT, BLOOD
Hematocrit: 50.1 % (ref 37.5–51.0)
Hemoglobin: 16.9 g/dL (ref 13.0–17.7)

## 2021-12-23 LAB — PSA, TOTAL AND FREE
PSA, Free Pct: 28.8 %
PSA, Free: 0.49 ng/mL
Prostate Specific Ag, Serum: 1.7 ng/mL (ref 0.0–4.0)

## 2021-12-23 LAB — TESTOSTERONE: Testosterone: 701 ng/dL (ref 264–916)

## 2021-12-23 LAB — SPECIMEN STATUS REPORT

## 2021-12-29 ENCOUNTER — Other Ambulatory Visit: Payer: Self-pay

## 2021-12-29 ENCOUNTER — Encounter: Payer: Self-pay | Admitting: Urology

## 2021-12-29 ENCOUNTER — Ambulatory Visit (INDEPENDENT_AMBULATORY_CARE_PROVIDER_SITE_OTHER): Payer: Medicare Other | Admitting: Urology

## 2021-12-29 VITALS — BP 131/66 | HR 80 | Wt 287.0 lb

## 2021-12-29 DIAGNOSIS — E291 Testicular hypofunction: Secondary | ICD-10-CM

## 2021-12-29 DIAGNOSIS — N5201 Erectile dysfunction due to arterial insufficiency: Secondary | ICD-10-CM | POA: Diagnosis not present

## 2021-12-29 DIAGNOSIS — R350 Frequency of micturition: Secondary | ICD-10-CM | POA: Diagnosis not present

## 2021-12-29 DIAGNOSIS — D751 Secondary polycythemia: Secondary | ICD-10-CM | POA: Diagnosis not present

## 2021-12-29 DIAGNOSIS — N138 Other obstructive and reflux uropathy: Secondary | ICD-10-CM | POA: Diagnosis not present

## 2021-12-29 DIAGNOSIS — N401 Enlarged prostate with lower urinary tract symptoms: Secondary | ICD-10-CM | POA: Diagnosis not present

## 2021-12-29 LAB — URINALYSIS, ROUTINE W REFLEX MICROSCOPIC
Bilirubin, UA: NEGATIVE
Glucose, UA: NEGATIVE
Ketones, UA: NEGATIVE
Leukocytes,UA: NEGATIVE
Nitrite, UA: NEGATIVE
Protein,UA: NEGATIVE
RBC, UA: NEGATIVE
Specific Gravity, UA: 1.025 (ref 1.005–1.030)
Urobilinogen, Ur: 0.2 mg/dL (ref 0.2–1.0)
pH, UA: 6 (ref 5.0–7.5)

## 2021-12-29 MED ORDER — TESTOSTERONE CYPIONATE 200 MG/ML IM SOLN: INTRAMUSCULAR | 1 refills | Status: DC

## 2021-12-29 NOTE — Progress Notes (Signed)
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 Acquired polycythemia   3. BPH with urinary obstruction   4. Urinary frequency   5. Erectile dysfunction due to arterial insufficiency      Devin [name] returns today in f/u.  He has been on TRT with 400mg  IM q2wks.  His T is 701.  His Hgb is 16.9 and his Hct is 50.1 which are minimally decreased from the last check. He was encouraged to donate blood but hasn't done that yet.   He is doing well on therapy.  He has had progressive ED and has not been using the VED.  He is able to function with a partial erection.    He remains on doxazosin 4mg  bid for his voiding symptoms.   His IPSS is 12.     His PSA was 1.7 with a 28% f/t ratio which is minimally changed.       IPSS     Row Name 12/29/21 1000         International Prostate Symptom Score   How often have you had the sensation of not emptying your bladder? Less than 1 in 5     How often have you had to urinate less than every two hours? About half the time     How often have you found you stopped and started again several times when you urinated? Less than half the time     How often have you found it difficult to postpone urination? Less than half the time     How often have you had a weak urinary stream? Less than half the time     How often have you had to strain to start urination? Less than 1 in 5 times     How many times did you typically get up at night to urinate? 1 Time     Total IPSS Score 12       Quality of Life due to urinary symptoms   If you were to spend the rest of your life with your urinary condition just the way it is now how would you feel about that? Mixed                 ROS:  Review of Systems  Constitutional:  Positive for malaise/fatigue and weight loss.  HENT:  Positive for congestion.   Respiratory:  Positive for cough and shortness of breath.   Cardiovascular:  Positive for leg swelling.  Musculoskeletal:  Positive for back pain and joint pain.   Skin:  Positive for itching.   Allergies  Allergen Reactions   Lasix [Furosemide] Nausea Only    Outpatient Encounter Medications as of 12/29/2021  Medication Sig Note   aspirin 81 MG tablet Take 1 tablet (81 mg total) by mouth daily with breakfast. For Heart    atorvastatin (LIPITOR) 10 MG tablet Take 10 mg by mouth daily.    clonazePAM (KLONOPIN) 0.5 MG tablet 0.5 mg at bedtime.  02/24/2015: Received from: External Pharmacy   cyanocobalamin (,VITAMIN B-12,) 1000 MCG/ML injection INJECT 1ML AS DIRECTED EVERY 4 WEEKS 04/06/2020: Was due to take this today   doxazosin (CARDURA) 4 MG tablet TAKE 1 TABLET(4 MG) BY MOUTH IN THE MORNING AND AT BEDTIME    naproxen sodium (ALEVE) 220 MG tablet Take 220 mg by mouth as needed.    PROAIR HFA 108 (90 Base) MCG/ACT inhaler Inhale 2 puffs into the lungs every 4 (four) hours as needed for wheezing or shortness  of breath.    torsemide (DEMADEX) 20 MG tablet Take 10 mg by mouth daily.    [DISCONTINUED] testosterone cypionate (DEPOTESTOSTERONE CYPIONATE) 200 MG/ML injection ADMINISTER 2 ML(400 MG) IN THE MUSCLE EVERY 14 DAYS    metoprolol succinate (TOPROL XL) 25 MG 24 hr tablet Take 1 tablet (25 mg total) by mouth daily. For Heart    testosterone cypionate (DEPOTESTOSTERONE CYPIONATE) 200 MG/ML injection ADMINISTER 2 ML(400 MG) IN THE MUSCLE EVERY 14 DAYS    No facility-administered encounter medications on file as of 12/29/2021.    Past Medical History:  Diagnosis Date   Arthritis    Asbestosis ([place])    CAD (coronary artery disease)    Multivessel status post CABG March 2016 - LIMA to LAD, SVG to OM1, SVG to OM2, and SVG to PDA.   Essential hypertension    GERD (gastroesophageal reflux disease)    Melanoma of back ([facility])    Prostatic hypertrophy    Sleep apnea    Intolerant of CPAP    Past Surgical History:  Procedure Laterality Date   ANTERIOR CERVICAL DECOMP/DISCECTOMY FUSION     CARDIOVERSION N/A 01/28/2015   Procedure: CARDIOVERSION;   Surgeon: Arnoldo Lenis, MD;  Location: [facility];  Service: Endoscopy;  Laterality: N/A;   CHOLECYSTECTOMY N/A 04/07/2020   Procedure: LAPAROSCOPIC CHOLECYSTECTOMY;  Surgeon: Virl Cagey, MD;  Location: [facility];  Service: General;  Laterality: N/A;   CORONARY ARTERY BYPASS GRAFT N/A 12/29/2014   Procedure: CORONARY ARTERY BYPASS GRAFTING (CABG), ON PUMP, TIMES FOUR, USING LEFT INTERNAL MAMMARY ARTERY, RIGHT GREATER SAPHENOUS VEIN HARVESTED ENDOSCOPICALLY;  Surgeon: Ivin Poot, MD;  Location: [facility];  Service: Open Heart Surgery;  Laterality: N/A;   KNEE ARTHROSCOPY Right    KNEE CARTILAGE SURGERY Left    LEFT HEART CATHETERIZATION WITH CORONARY ANGIOGRAM N/A 12/28/2014   Procedure: LEFT HEART CATHETERIZATION WITH CORONARY ANGIOGRAM;  Surgeon: Lorretta Harp, MD;  Location: [facility] CATH [facility];  Service: Cardiovascular;  Laterality: N/A;   MELANOMA EXCISION     "back"   SHOULDER OPEN ROTATOR CUFF REPAIR Left    TEE WITHOUT CARDIOVERSION N/A 12/29/2014   Procedure: TRANSESOPHAGEAL ECHOCARDIOGRAM (TEE);  Surgeon: Ivin Poot, MD;  Location: [facility];  Service: Open Heart Surgery;  Laterality: N/A;   TEE WITHOUT CARDIOVERSION N/A 01/28/2015   Procedure: TRANSESOPHAGEAL ECHOCARDIOGRAM (TEE);  Surgeon: Arnoldo Lenis, MD;  Location: [facility];  Service: Endoscopy;  Laterality: N/A;    Social History   Socioeconomic History   Marital status: Married    Spouse name: Not on file   Number of children: 7   Years of education: Not on file   Highest education level: Not on file  Occupational History   Not on file  Tobacco Use   Smoking status: Never   Smokeless tobacco: Never  Vaping Use   Vaping Use: Never used  Substance and Sexual Activity   Alcohol use: Yes    Alcohol/week: 0.0 standard drinks    Comment: Occasional   Drug use: No   Sexual activity: Yes    Partners: Female  Other Topics Concern   Not on file  Social History Narrative   Lives at home with wife.  They have seven  children.     Social Determinants of Health   Financial Resource Strain: Not on file  Food Insecurity: Not on file  Transportation Needs: Not on file  Physical Activity: Not on file  Stress: Not on file  Social Connections: Not on  file  Intimate Partner Violence: Not on file    Family History  Problem Relation Age of Onset   Stroke Mother    Hypertension Mother    Heart disease Sister        Died from complications of RHD   Heart disease Brother        Heart failure related to EtOH and smoking   Stomach cancer Sister        Objective: Vitals:   12/29/21 0934  BP: 131/66  Pulse: 80     Physical Exam  Recent Results (from the past 2160 hour(s))  Testosterone     Status: None   Collection Time: 12/22/21 12:00 AM  Result Value Ref Range   Testosterone 701 264 - 916 ng/dL    Comment: Adult male reference interval is based on a population of healthy nonobese males (BMI <30) between 39 and 45 years old. [place], [place] [phone]. PMID: [id number].   Specimen status report     Status: None   Collection Time: 12/22/21 12:00 AM  Result Value Ref Range   specimen status report Comment     Comment: Please note Please note The date and/or time of collection was not indicated on the requisition as required by state and federal law.  The date of receipt of the specimen was used as the collection date if not supplied.   PSA, total and free     Status: None   Collection Time: 12/22/21  8:39 AM  Result Value Ref Range   Prostate Specific Ag, Serum 1.7 0.0 - 4.0 ng/mL    Comment: Roche ECLIA methodology. According to the American Urological Association, Serum PSA should decrease and remain at undetectable levels after radical prostatectomy. The AUA defines biochemical recurrence as an initial PSA value 0.2 ng/mL or greater followed by a subsequent confirmatory PSA value 0.2 ng/mL or greater. Values obtained with different assay methods or kits cannot be  used interchangeably. Results cannot be interpreted as absolute evidence of the presence or absence of malignant disease.    PSA, Free 0.49 N/A ng/mL    Comment: Roche ECLIA methodology.   PSA, Free Pct 28.8 %    Comment: The table below lists the probability of prostate cancer for men with non-suspicious DRE results and total PSA between 4 and 10 ng/mL, by patient age Ricci Barker, [place], [id number]).                   % Free PSA       50-64 yr        65-75 yr                   0.00-10.00%        56%             55%                  10.01-15.00%        24%             35%                  15.01-20.00%        17%             23%                  20.01-25.00%        10%  20%                       >25.00%         5%              9% Please note:  Catalona et al did not make specific               recommendations regarding the use of               percent free PSA for any other population               of men.   Hemoglobin and hematocrit, blood     Status: None   Collection Time: 12/22/21  8:39 AM  Result Value Ref Range   Hemoglobin 16.9 13.0 - 17.7 g/dL   Hematocrit 50.1 37.5 - 51.0 %  Urinalysis, Routine w reflex microscopic     Status: None   Collection Time: 12/29/21 11:38 AM  Result Value Ref Range   Specific Gravity, UA 1.025 1.005 - 1.030   pH, UA 6.0 5.0 - 7.5   Color, UA Yellow Yellow   Appearance Ur Clear Clear   Leukocytes,UA Negative Negative   Protein,UA Negative Negative/Trace   Glucose, UA Negative Negative   Ketones, UA Negative Negative   RBC, UA Negative Negative   Bilirubin, UA Negative Negative   Urobilinogen, Ur 0.2 0.2 - 1.0 mg/dL   Nitrite, UA Negative Negative   Microscopic Examination Comment     Comment: Microscopic follows if indicated.   Lab Results  Component Value Date   PSA1 1.7 12/22/2021   UA is clear.     Studies/Results:     Assessment & Plan: Hypogonadism.  His is doing well on current therapy.  Med refilled.   F/U in 6 months with labs.  Secondary Polycythemia.  His Hgb is down slightly after a blood donation.  BPH with BOO.  He is doing well on doxazosin and his script is current.  PSA is 1.7  His prostate is 74ml without a middle lobe.   ED.  He is getting by on no treatment.  He doesn't like the VED.    Orders Placed This Encounter  Procedures   Urinalysis, Routine w reflex microscopic   Testosterone    Standing Status:   Future    Standing Expiration Date:   12/30/2022   Hemoglobin and hematocrit, blood    Standing Status:   Future    Standing Expiration Date:   12/30/2022    Return in about 6 months (around 07/01/2022) for with labs.   CC: [name], MD       Irine Seal 12/29/2021 Patient ID: Devin [name]., male   DOB: 30-Nov-1945, 76 y.o.   MRN: [id number]

## 2022-01-09 DIAGNOSIS — D510 Vitamin B12 deficiency anemia due to intrinsic factor deficiency: Secondary | ICD-10-CM | POA: Diagnosis not present

## 2022-01-09 DIAGNOSIS — N529 Male erectile dysfunction, unspecified: Secondary | ICD-10-CM | POA: Diagnosis not present

## 2022-01-09 DIAGNOSIS — Z125 Encounter for screening for malignant neoplasm of prostate: Secondary | ICD-10-CM | POA: Diagnosis not present

## 2022-01-09 DIAGNOSIS — Z79899 Other long term (current) drug therapy: Secondary | ICD-10-CM | POA: Diagnosis not present

## 2022-01-09 DIAGNOSIS — I2510 Atherosclerotic heart disease of native coronary artery without angina pectoris: Secondary | ICD-10-CM | POA: Diagnosis not present

## 2022-01-09 DIAGNOSIS — J61 Pneumoconiosis due to asbestos and other mineral fibers: Secondary | ICD-10-CM | POA: Diagnosis not present

## 2022-01-09 DIAGNOSIS — I10 Essential (primary) hypertension: Secondary | ICD-10-CM | POA: Diagnosis not present

## 2022-01-16 DIAGNOSIS — J61 Pneumoconiosis due to asbestos and other mineral fibers: Secondary | ICD-10-CM | POA: Diagnosis not present

## 2022-01-16 DIAGNOSIS — I10 Essential (primary) hypertension: Secondary | ICD-10-CM | POA: Diagnosis not present

## 2022-01-16 DIAGNOSIS — I4510 Unspecified right bundle-branch block: Secondary | ICD-10-CM | POA: Diagnosis not present

## 2022-01-16 DIAGNOSIS — I2510 Atherosclerotic heart disease of native coronary artery without angina pectoris: Secondary | ICD-10-CM | POA: Diagnosis not present

## 2022-01-16 DIAGNOSIS — E785 Hyperlipidemia, unspecified: Secondary | ICD-10-CM | POA: Diagnosis not present

## 2022-01-16 DIAGNOSIS — Z0001 Encounter for general adult medical examination with abnormal findings: Secondary | ICD-10-CM | POA: Diagnosis not present

## 2022-01-17 ENCOUNTER — Other Ambulatory Visit: Payer: Self-pay

## 2022-01-26 ENCOUNTER — Other Ambulatory Visit: Payer: Self-pay | Admitting: Urology

## 2022-01-31 ENCOUNTER — Telehealth: Payer: Self-pay

## 2022-01-31 ENCOUNTER — Other Ambulatory Visit: Payer: Self-pay

## 2022-01-31 DIAGNOSIS — R718 Other abnormality of red blood cells: Secondary | ICD-10-CM | POA: Diagnosis not present

## 2022-01-31 DIAGNOSIS — R350 Frequency of micturition: Secondary | ICD-10-CM

## 2022-01-31 MED ORDER — DOXAZOSIN MESYLATE 4 MG PO TABS: ORAL_TABLET | ORAL | 3 refills | Status: DC

## 2022-01-31 NOTE — Progress Notes (Signed)
Refill sent for doxazosin ?

## 2022-01-31 NOTE — Telephone Encounter (Signed)
Patient wife called stating that pharmacy needed clarification on his rx for the refill request.  Resent rx for a qty of 60 instead of of 90 with sig to take 1 tablet po in the am and pm as directed by provider. ?

## 2022-02-17 ENCOUNTER — Ambulatory Visit (HOSPITAL_COMMUNITY)
Admission: RE | Admit: 2022-02-17 | Discharge: 2022-02-17 | Disposition: A | Discharge status: Home / Self Care | Payer: Medicare Other | Source: Ambulatory Visit | Attending: Internal Medicine | Admitting: Internal Medicine

## 2022-02-17 ENCOUNTER — Ambulatory Visit (INDEPENDENT_AMBULATORY_CARE_PROVIDER_SITE_OTHER): Payer: Medicare Other | Admitting: Internal Medicine

## 2022-02-17 ENCOUNTER — Encounter: Payer: Self-pay | Admitting: Internal Medicine

## 2022-02-17 DIAGNOSIS — R058 Other specified cough: Secondary | ICD-10-CM | POA: Diagnosis not present

## 2022-02-17 DIAGNOSIS — R0609 Other forms of dyspnea: Secondary | ICD-10-CM | POA: Insufficient documentation

## 2022-02-17 DIAGNOSIS — R059 Cough, unspecified: Secondary | ICD-10-CM | POA: Diagnosis not present

## 2022-02-17 DIAGNOSIS — J45991 Cough variant asthma: Secondary | ICD-10-CM | POA: Insufficient documentation

## 2022-02-17 DIAGNOSIS — R0602 Shortness of breath: Secondary | ICD-10-CM | POA: Diagnosis not present

## 2022-02-17 MED ORDER — PREDNISONE 10 MG PO TABS: ORAL_TABLET | ORAL | 0 refills | Status: DC

## 2022-02-17 MED ORDER — AMOXICILLIN-POT CLAVULANATE 875-125 MG PO TABS: 1.0000 | ORAL_TABLET | Freq: Two times a day (BID) | ORAL | 0 refills | Status: DC

## 2022-02-17 MED ORDER — PANTOPRAZOLE SODIUM 40 MG PO TBEC: 40.0000 mg | DELAYED_RELEASE_TABLET | Freq: Every day | ORAL | 2 refills | Status: DC

## 2022-02-17 MED ORDER — FAMOTIDINE 20 MG PO TABS: ORAL_TABLET | ORAL | 11 refills | Status: DC

## 2022-02-17 MED ORDER — AMOXICILLIN-POT CLAVULANATE 875-125 MG PO TABS: 1.0000 | ORAL_TABLET | Freq: Two times a day (BID) | ORAL | 0 refills | Status: AC

## 2022-02-17 NOTE — Patient Instructions (Addendum)
Augmentin 875 mg take one pill twice daily  X  20 days - take at breakfast and supper with large glass of water.  It would help reduce the usual side effects (diarrhea and yeast infections) if you ate cultured yogurt at lunch.  ? ?Pantoprazole (protonix) 40 mg   Take  30-60 min before first meal of the day and Pepcid (famotidine)  20 mg after supper until return to office - this is the best way to tell whether stomach acid is contributing to your problem.   ? ?GERD (REFLUX)  is an extremely common cause of respiratory symptoms just like yours , many times with no obvious heartburn at all.  ? ? It can be treated with medication, but also with lifestyle changes including elevation of the head of your bed (ideally with 6 -8inch blocks under the headboard of your bed),  Smoking cessation, avoidance of late meals, excessive alcohol, and avoid fatty foods, chocolate, peppermint, colas, red wine, and acidic juices such as orange juice.  ?NO MINT OR MENTHOL PRODUCTS SO NO COUGH DROPS  ?USE SUGARLESS CANDY INSTEAD (Jolley ranchers or Stover's or Life Savers) or even ice chips will also do - the key is to swallow to prevent all throat clearing. ?NO OIL BASED VITAMINS - use powdered substitutes.  Avoid fish oil when coughing.  ? ? Work on inhaler technique:  relax and gently blow all the way out then take a nice smooth full deep breath back in, triggering the inhaler at same time you start breathing in.  Hold for up to 5 seconds if you can.  ? ? Only use your albuterol as a rescue medication to be used if you can't catch your breath by resting or doing a relaxed purse lip breathing pattern.  ?- The less you use it, the better it will work when you need it. ?- Ok to use up to 2 puffs  every 4 hours if you must but call for immediate appointment if use goes up over your usual need ?- Don't leave home without it !!  (think of it like the spare tire for your car)  ? ?Ok to try albuterol 15 min before an activity (on alternating  days)  that you know would usually make you short of breath and see if it makes any difference and if makes none then don't take albuterol after activity unless you can't catch your breath as this means it's the resting that helps, not the albuterol. ?    ?Please remember to go to the  x-ray department  @  [facility] for your tests - we will call you with the results when they are available    ? ?Please schedule a follow up office visit in 2 weeks, sooner if needed  with all medications /inhalers/ solutions in hand so we can verify exactly what you are taking. This includes all medications from all doctors and over the counters  ?   ?

## 2022-02-17 NOTE — Progress Notes (Addendum)
? ?Devin [name]., male    DOB: [birth year],    MRN: [id number] ? ? ?Brief patient profile:  ?76 yowm  never smoker/MM   with onset doe in the 1980s and gradually worse until ? covid 11/2018 (not cofirmed) > Dr Willey Blade rx included  Prednisone/abx > better breathing for months but since early 2021 worse again so referred to pulmonary clinic in [facility]  12/07/2020 by Dr Willey Blade ? ? ? ? ?History of Present Illness  ?12/07/2020  Pulmonary/ 1st office eval/ [name] / [name]  ?Chief Complaint  ?Patient presents with  ? Consult  ?  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  ?Cough:  X one year/ esp at hs immediately on either side /  No am flair ?Sleep: bed is flat/ one pillow  - sleeps in recliner 50% of night and usually helps relieve cough / breathing x years  ?SABA use: did not help  ?Swallowing is ok  ?Rec ?Augmentin 875 mg take one pill twice daily  X 10 days  ?Call if cough/ congestion not better after the augmentin and we can do sinus CT  ?For cough / congestion > mucinex 1200 mg every 12 hours over the counter and flutter valve as much as possible  ?GERD diet reviewed, bed blocks rec  ?Does not recall whether better p augmentin but then got covid and did nto return as rec for additonal w/u ? ?07/2021 annual eval by Pearlie Oyster in [place] "Asbestosis  but breathing better" based on pfts /ct (pts report, records requested ) ? ?02/17/2022 ext  f/u ov/[place] office/Dailah Opperman re: sob/ cough  maint on nothing but lots of sab   ?Chief Complaint  ?Patient presents with  ? Follow-up  ?  Trouble with breathing. Coughing up green mucus. Chest tightness  ?States he is coughing up big clumps of mucus  ?Wants to discuss inhaler   ?Dyspnea:  barn is 100 yards variable doe  flat surface/ would like to be able to hunt with hills  ?Cough: convinced he has mucus stuck/  nightly resp disturbance at least 3 hp hs  ?Sleeping: bed blocks ?SABA use: albuterol hs and  every 4 hours (prev said it didn't help) ?02: none  ?Covid status: last infection 11/2020  and vax x one  ?  ? ? ?No obvious patterns in day to day or daytime variability or assoc  mucus plugs or hemoptysis or cp or chest tightness, subjective wheeze or overt sinus or hb symptoms.  ? ?  Also denies any obvious fluctuation of symptoms with weather or environmental changes or other aggravating or alleviating factors except as outlined above  ? ?No unusual exposure hx or h/o childhood pna/ asthma or knowledge of premature birth. ? ?Current Allergies, Complete Past Medical History, Past Surgical History, Family History, and Social History were reviewed in [employer] record. ? ?ROS  The following are not active complaints unless bolded ?Hoarseness, sore throat, dysphagia, dental problems, itching, sneezing,  nasal congestion or discharge of excess mucus or purulent secretions, ear ache,   fever, chills, sweats, unintended wt loss or wt gain, classically pleuritic or exertional cp,  orthopnea pnd or arm/hand swelling  or leg swelling, presyncope, palpitations, abdominal pain, anorexia, nausea, vomiting, diarrhea  or change in bowel habits or change in bladder habits, change in stools or change in urine, dysuria, hematuria,  rash, arthralgias, visual complaints, headache, numbness, weakness or ataxia or problems  with walking or coordination,  change in mood or  memory. ?      ? ?Current Meds  ?Medication Sig  ? aspirin 81 MG tablet Take 1 tablet (81 mg total) by mouth daily with breakfast. For Heart  ? atorvastatin (LIPITOR) 10 MG tablet Take 10 mg by mouth daily.  ? clonazePAM (KLONOPIN) 0.5 MG tablet 0.5 mg at bedtime.   ? cyanocobalamin (,VITAMIN B-12,) 1000 MCG/ML injection INJECT 1ML AS DIRECTED EVERY 4 WEEKS  ? doxazosin (CARDURA) 4 MG tablet TAKE 1 TABLET(4 MG) BY MOUTH IN THE MORNING AND AT BEDTIME  ? naproxen sodium (ALEVE) 220 MG tablet Take 220 mg by mouth as needed.  ? PROAIR HFA 108 (90  Base) MCG/ACT inhaler Inhale 2 puffs into the lungs every 4 (four) hours as needed for wheezing or shortness of breath.  ? testosterone cypionate (DEPOTESTOSTERONE CYPIONATE) 200 MG/ML injection ADMINISTER 2 ML(400 MG) IN THE MUSCLE EVERY 14 DAYS  ? torsemide (DEMADEX) 20 MG tablet Take 10 mg by mouth daily.  ?     ? ?  ? ? ?Past Medical History:  ?Diagnosis Date  ? Arthritis   ? Asbestosis ([place])   ? CAD (coronary artery disease)   ? Multivessel status post CABG March 2016 - LIMA to LAD, SVG to OM1, SVG to OM2, and SVG to PDA.  ? Essential hypertension   ? GERD (gastroesophageal reflux disease)   ? Melanoma of back ([place])   ? Prostatic hypertrophy   ? Sleep apnea   ? Intolerant of CPAP  ?  ? ? ?Objective:  ?  ? ?  ?Wt Readings from Last 3 Encounters:  ?02/17/22 285 lb 6.4 oz (129.5 kg)  ?12/29/21 287 lb (130.2 kg)  ?03/31/21 283 lb (128.4 kg)  ?  ? ? ?Vital signs reviewed  02/17/2022  - Note at rest 02 sats  94% on RA  ? ?General appearance:    somber amb wm / spont gurgle then clears throat and gurgle gone with cmpletely dry cough on voluntary cough maneuver  ? ? HEENT : no pnd/ cobblestoning / turbinates s edema.   ? ? ?NECK :  without JVD/Nodes/TM/ nl carotid upstrokes bilaterally ? ? ?LUNGS: no acc muscle use,  Nl contour chest which is clear to A and P bilaterally without cough on insp or exp maneuvers ? ? ?CV:  RRR  no s3 or murmur or increase in P2, and trace to 1+  pitting edema bilateral LE s ? ?ABD:  soft and nontender with nl inspiratory excursion in the supine position. No bruits or organomegaly appreciated, bowel sounds nl ? ?MS:  Nl gait/ ext warm without deformities, calf tenderness, cyanosis or clubbing ?No obvious joint restrictions  ? ?SKIN: warm and dry without lesions   ? ?NEURO:  alert, approp, nl sensorium with  no motor or cerebellar deficits apparent.  ?  ? ? CXR PA and Lateral:   02/17/2022 :    ?I personally reviewed images and impression is as follows:     ?C/w very mild asbestosis s interval  changes  ?   ?Assessment  ?  ?   ?

## 2022-02-17 NOTE — Assessment & Plan Note (Addendum)
?   Onset 2020 ?-  Labs ordered 12/07/20   :  allergy screen with Eos 0.2 and IgE 35 ?- rx augmentin x 20 days then sinus ct or ent eval next  ? ?? Asthma vs (favor) Upper airway cough syndrome (previously labeled PNDS),  is so named because it's frequently impossible to sort out how much is  CR/sinusitis with freq throat clearing (which can be related to primary GERD)   vs  causing  secondary (" extra esophageal")  GERD from wide swings in gastric pressure that occur with throat clearing, often  promoting self use of mint and menthol lozenges that reduce the lower esophageal sphincter tone and exacerbate the problem further in a cyclical fashion.  ? ?These are the same pts (now being labeled as having "irritable larynx syndrome" by some cough centers) who not infrequently have a history of having failed to tolerate ace inhibitors,  dry powder inhalers or biphosphonates or report having atypical/extraesophageal reflux symptoms that don't respond to standard doses of PPI  and are easily confused as having aecopd or asthma flares by even experienced allergists/ pulmonologists (myself included).  ? ?>> rx max gerd diet/ acid suppression and augmentin as above ? ?Advised pt:  ?The standardized cough guidelines published in Chest by Lissa Morales in 2006 are still the best available and consist of a multiple step process (up to 12!) , not a single office visit,  and are intended  to address this problem logically,  with an alogrithm dependent on response to empiric treatment at  each progressive step  to determine a specific diagnosis with  minimal addtional testing needed. Therefore if adherence is an issue or can't be accurately verified,  it's very unlikely the standard evaluation and treatment will be successful here.   ? ?Furthermore, response to therapy (other than acute cough suppression, which should only be used short term with avoidance of narcotic containing cough syrups if possible), can be a gradual process for  which the patient is not likely to  perceive immediate benefit. ? ?Unlike going to an eye doctor where the best perscription is almost always the first one and is immediately effective, this is almost never the case in the management of chronic cough syndromes. Therefore the patient needs to commit up front to consistently adhere to recommendations  for up to 6 weeks of therapy directed at the likely underlying problem(s) before the response can be reasonably evaluated. ? ?F/u 2 weeks with all meds in hand using a trust but verify approach to confirm accurate Medication  Reconciliation The principal here is that until we are certain that the  patients are doing what we've asked, it makes no sense to ask them to do more.  ? ? ? ?Each maintenance medication was reviewed in detail including emphasizing most importantly the difference between maintenance and prns and under what circumstances the prns are to be triggered using an action plan format where appropriate. ? ?Total time for H and P, chart review, counseling, , directly observing portions of ambulatory 02 saturation study/ and generating customized AVS unique to this office visit / same day charting > 40 min in pt not seen in over a year ?     ?  ?      ?

## 2022-02-17 NOTE — Assessment & Plan Note (Addendum)
Onset 1980s with background of asbestos exp ?- CT s contrast 09/07/20  No CT evidence of pleural or pulmonary malignancy. No fibrosis ?-  12/07/2020   Walked RA  approx   600 ft  @ moderate pace  stopped due to  End of study with sats 95% at end   ?  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 ?Symptoms continue to be disproportionate to objective findings and not clear to what extent this is actually a pulmonary  problem but pt does appear to have difficult to sort out respiratory symptoms of unknown origin for which  DDX  = almost all start with A and  include Adherence, Ace Inhibitors, Acid Reflux, Active Sinus Disease, Alpha 1 Antitripsin deficiency(ruled out already), Anxiety/depression masquerading as Airways dz,  ABPA,  Allergy(esp in young), Aspiration (esp in elderly), Adverse effects of meds,  Active smoking or Vaping, A bunch of PE's/clot burden (a few small clots can't cause this syndrome unless there is already severe underlying pulm or vascular dz with poor reserve),  Anemia or thyroid disorder, plus two Bs  = Bronchiectasis and Beta blocker use..and one C= CHF  ? ?Adherence is always the initial "prime suspect" and is a multilayered concern that requires a "trust but verify" approach in every patient - starting with knowing how to use medications, especially inhalers, correctly, keeping up with refills and understanding the fundamental difference between maintenance and prns vs those medications only taken for a very short course and then stopped and not refilled.  ?- - The proper method of use, as well as anticipated side effects, of a metered-dose inhaler were discussed and demonstrated to the patient using teach back method. Improved effectiveness after extensive coaching during this visit to a level of approximately 75 % from a baseline of 25  % ok to continue saba for now ? ?? Acid (or non-acid) GERD > always difficult to exclude as up to 75% of pts in some series report no assoc GI/ Heartburn symptoms> rec max (24h)  acid suppression and diet restrictions/ reviewed and instructions given in writing.  ? ?? Active sinus dz > augmentin x 20 days and f/u before completes ? ?? Allergy/asthma> note IgE eos nl last ov so less likely  >>  Prednisone 10 mg take  4 each am x 2 days,   2 each am x 2 days,  1 each am x 2 days and stop  ?Re SABA :  I spent extra time with pt today reviewing appropriate use of albuterol for prn use on exertion with the following points: ?1) saba is for relief of sob that does not improve by walking a slower pace or resting but rather if the pt does not improve after trying this first. ?2) If the pt is convinced, as many are, that saba helps recover from activity faster then it's easy to tell if this is the case by re-challenging : ie stop, take the inhaler, then p 5 minutes try the exact same activity (intensity of workload) that just caused the symptoms and see if they are substantially diminished or not after saba ?3) if there is an activity that reproducibly causes the symptoms, try the saba 15 min  before the activity on alternate days  ? ?If in fact the saba really does help, then fine to continue to use it prn but advised may need to look closer at the maintenance regimen being used to achieve better control of airways disease with exertion.  ? ? ?? Anxiety/depression/deconditioning with assoc with gain  > usually at the bottom of this list of usual suspects but should be much higher on this pt's based on H and P and note already on psychotropics and may interfere with adherence and also interpretation of response or lack thereof to symptom management which can be quite subjective.   ? ?? Anemia/thyroid dz > ruled out last ov  ? ? ? Bronchiectasis > requested CT report from [place] ? ?? chf > bnp nl last eval  ?  ?

## 2022-02-21 DIAGNOSIS — B079 Viral wart, unspecified: Secondary | ICD-10-CM | POA: Diagnosis not present

## 2022-02-21 DIAGNOSIS — D485 Neoplasm of uncertain behavior of skin: Secondary | ICD-10-CM | POA: Diagnosis not present

## 2022-02-21 DIAGNOSIS — L570 Actinic keratosis: Secondary | ICD-10-CM | POA: Diagnosis not present

## 2022-02-21 DIAGNOSIS — Z1283 Encounter for screening for malignant neoplasm of skin: Secondary | ICD-10-CM | POA: Diagnosis not present

## 2022-02-21 DIAGNOSIS — Z85820 Personal history of malignant melanoma of skin: Secondary | ICD-10-CM | POA: Diagnosis not present

## 2022-03-06 ENCOUNTER — Ambulatory Visit (INDEPENDENT_AMBULATORY_CARE_PROVIDER_SITE_OTHER): Payer: Medicare Other | Admitting: Internal Medicine

## 2022-03-06 ENCOUNTER — Encounter: Payer: Self-pay | Admitting: Internal Medicine

## 2022-03-06 ENCOUNTER — Ambulatory Visit: Payer: Medicare Other | Admitting: Internal Medicine

## 2022-03-06 VITALS — BP 142/82 | HR 74 | Temp 98.0°F | Ht 71.0 in | Wt 286.4 lb

## 2022-03-06 DIAGNOSIS — J45991 Cough variant asthma: Secondary | ICD-10-CM | POA: Diagnosis not present

## 2022-03-06 DIAGNOSIS — R058 Other specified cough: Secondary | ICD-10-CM | POA: Diagnosis not present

## 2022-03-06 DIAGNOSIS — R0609 Other forms of dyspnea: Secondary | ICD-10-CM

## 2022-03-06 NOTE — Assessment & Plan Note (Addendum)
?   Onset 2020 ?-  Labs ordered 12/07/20   :  allergy screen with Eos 0.2 and IgE 35 ?- rx augmentin x 20 days then sinus ct or ent eval next > improved 03/06/2022 but stated never able to breath thru nose x years > referred to ENT and pulmonary f/u prn p ent eval complete ? ?- The proper method of use, as well as anticipated side effects, of a metered-dose inhaler were discussed and demonstrated to the patient using teach back method. Improved effectiveness after extensive coaching during this visit to a level of approximately 80 % from a baseline of 50 % (short Ti)   ? ?Since he is followed by Dr Pearlie Oyster anyway rec he just use saba prn and f/u here prn  ? ? ?    ?  ? ?Each maintenance medication was reviewed in detail including emphasizing most importantly the difference between maintenance and prns and under what circumstances the prns are to be triggered using an action plan format where appropriate. ? ?Total time for H and P, chart review, counseling, reviewing hfa device(s) and generating customized AVS unique to this office visit / same day charting  > 30 min  ?     ?

## 2022-03-06 NOTE — Assessment & Plan Note (Signed)
Onset 1980s with background of asbestos exp ?- CT s contrast 09/07/20  No CT evidence of pleural or pulmonary malignancy. No fibrosis ?- CT s contast 10/13/21 No ILD/ no asbestos plaques  ?-  12/07/2020   Walked RA  approx   600 ft  @ moderate pace  stopped due to  End of study with sats 95% at end   ?  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 ?Improving with rx of apparent acute/ chronic sinusitis so plan f/u in pulmonary once completes ent eval/rx ?

## 2022-03-06 NOTE — Patient Instructions (Addendum)
I will be referring you to ENT in [facility] to evaluate your problem breathing thru your nose and the possibility if affecting your sinuses and your lungs. ? ?Pulmonary follow up is as needed after you finish up with your ENT if not satisfied.  ? ? ? ?

## 2022-03-06 NOTE — Progress Notes (Signed)
? ?Devin [name]., male    DOB: 1946/10/23,    MRN: [id number] ? ? ?Brief patient profile:  ?86 yowm  never smoker/MM   with onset doe in the 1980s and gradually worse until ? covid 11/2018 (not cofirmed) > Dr Willey Blade rx included  Prednisone/abx > better breathing for months but since early 2021 worse again so referred to pulmonary clinic in [facility]  12/07/2020 by Dr Willey Blade ? ? ? ? ?History of Present Illness  ?12/07/2020  Pulmonary/ 1st office eval/ [name] / [name]  ?Chief Complaint  ?Patient presents with  ? Consult  ?  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  ?Cough:  X one year/ esp at hs immediately on either side /  No am flair ?Sleep: bed is flat/ one pillow  - sleeps in recliner 50% of night and usually helps relieve cough / breathing x years  ?SABA use: did not help  ?Swallowing is ok  ?Rec ?Augmentin 875 mg take one pill twice daily  X 10 days  ?Call if cough/ congestion not better after the augmentin and we can do sinus CT  ?For cough / congestion > mucinex 1200 mg every 12 hours over the counter and flutter valve as much as possible  ?GERD diet reviewed, bed blocks rec  ?Does not recall whether better p augmentin but then got covid and did not  return as rec for additonal w/u ? ?Covid status: last infection 11/2020 ? ?10/13/21 annual eval by Pearlie Oyster in [place] "Asbestosis  but breathing better" but note Ct's did not support this dz ? ?02/17/2022 ext  f/u ov/[place] office/Wynona Duhamel re: sob/ cough  maint on nothing but lots of saba   ?Chief Complaint  ?Patient presents with  ? Follow-up  ?  Trouble with breathing. Coughing up green mucus. Chest tightness  ?States he is coughing up big clumps of mucus  ?Wants to discuss inhaler   ?Dyspnea:  barn is 100 yards variable doe  flat surface/ would like to be able to hunt with hills  ?Cough: convinced he has mucus stuck/  nightly resp disturbance at least 3 hp hs  ?Sleeping: bed  blocks ?SABA use: albuterol hs and every 4 hours (prev said it didn't help) ?02: none  ?Covid status: last infection 11/2020  and vax x one  ?Rec ?Augmentin 875 mg take one pill twice daily  X  20 days  ?Pantoprazole (protonix) 40 mg   Take  30-60 min before first meal of the day and Pepcid (famotidine)  20 mg after supper until return to office ?GERD diet reviewed, bed blocks rec  ?Work on inhaler technique:  ?se your albuterol as a rescue medication  ?Also Ok to try albuterol 15 min before an activity (on alternating days)  that you know would usually make you short of breath   ?Please schedule a follow up office visit in 2 weeks, sooner if needed  with all medications /inhalers/ solutions in hand  ? ?03/06/2022  f/u ov/[facility] office/Lulia Schriner re: cough  maint on  no resp rx  ?Chief Complaint  ?Patient presents with  ? Follow-up  ?  Augmentin has helped. Breathing has improved.   ?Dyspnea:  improved but still struggling with hills due to "ankles rolling" no doe ?Cough: better / still some grey mucus plug  ?Sleeping: bed blocks / no resp ?SABA use: not using as much  ?02: none  ?Covid status: vax x one ? ?  ? ? ?  No obvious day to day or daytime variability or assoc excess/ purulent sputum or mucus plugs or hemoptysis or cp or chest tightness, subjective wheeze or overt sinus or hb symptoms.  ? ?Sleeping much better  without nocturnal  or early am exacerbation  of respiratory  c/o's or need for noct saba. Also denies any obvious fluctuation of symptoms with weather or environmental changes or other aggravating or alleviating factors except as outlined above  ? ?No unusual exposure hx or h/o childhood pna/ asthma or knowledge of premature birth. ? ?Current Allergies, Complete Past Medical History, Past Surgical History, Family History, and Social History were reviewed in [employer] record. ? ?ROS  The following are not active complaints unless bolded ?Hoarseness, sore throat, dysphagia, dental  problems, itching, sneezing,  nasal congestion or discharge of excess mucus or purulent secretions, ear ache,   fever, chills, sweats, unintended wt loss or wt gain, classically pleuritic or exertional cp,  orthopnea pnd or arm/hand swelling  or leg swelling, presyncope, palpitations, abdominal pain, anorexia, nausea, vomiting, diarrhea  or change in bowel habits or change in bladder habits, change in stools or change in urine, dysuria, hematuria,  rash, arthralgias, visual complaints, headache, numbness, weakness or ataxia or problems with walking or coordination,  change in mood or  memory. ?      ? ?Current Meds  ?Medication Sig  ? amoxicillin-clavulanate (AUGMENTIN) 875-125 MG tablet Take 1 tablet by mouth 2 (two) times daily for 20 days.  ? aspirin 81 MG tablet Take 1 tablet (81 mg total) by mouth daily with breakfast. For Heart  ? atorvastatin (LIPITOR) 10 MG tablet Take 10 mg by mouth daily.  ? clonazePAM (KLONOPIN) 0.5 MG tablet 0.5 mg at bedtime.   ? cyanocobalamin (,VITAMIN B-12,) 1000 MCG/ML injection INJECT 1ML AS DIRECTED EVERY 4 WEEKS  ? doxazosin (CARDURA) 4 MG tablet TAKE 1 TABLET(4 MG) BY MOUTH IN THE MORNING AND AT BEDTIME  ? famotidine (PEPCID) 20 MG tablet One after supper  ? naproxen sodium (ALEVE) 220 MG tablet Take 220 mg by mouth as needed.  ? pantoprazole (PROTONIX) 40 MG tablet Take 1 tablet (40 mg total) by mouth daily. Take 30-60 min before first meal of the day  ? predniSONE (DELTASONE) 10 MG tablet Take  4 each am x 2 days,   2 each am x 2 days,  1 each am x 2 days and stop  ? PROAIR HFA 108 (90 Base) MCG/ACT inhaler Inhale 2 puffs into the lungs every 4 (four) hours as needed for wheezing or shortness of breath.  ? testosterone cypionate (DEPOTESTOSTERONE CYPIONATE) 200 MG/ML injection ADMINISTER 2 ML(400 MG) IN THE MUSCLE EVERY 14 DAYS  ? torsemide (DEMADEX) 20 MG tablet Take 10 mg by mouth daily.  ?     ? ?  ?  ?  ? ?  ? ? ?Past Medical History:  ?Diagnosis Date  ? Arthritis   ?  Asbestosis ([place])   ? CAD (coronary artery disease)   ? Multivessel status post CABG March 2016 - LIMA to LAD, SVG to OM1, SVG to OM2, and SVG to PDA.  ? Essential hypertension   ? GERD (gastroesophageal reflux disease)   ? Melanoma of back ([place])   ? Prostatic hypertrophy   ? Sleep apnea   ? Intolerant of CPAP  ?  ? ? ?Objective:  ?  ?Wts  ? ?        ?03/06/2022  286   ?02/17/22 285 lb 6.4 oz (129.5 kg)  ?12/29/21 287 lb (130.2 kg)  ?03/31/21 283 lb (128.4 kg)  ?  ?Vital signs reviewed  03/06/2022  - Note at rest 02 sats  97% on RA  ? ?General appearance:    amb obese wm nad  ? ? HEENT : mod severe TE bilaterally s pnd  ? ? ?NECK :  without JVD/Nodes/TM/ nl carotid upstrokes bilaterally ? ? ?LUNGS: no acc muscle use,  Nl contour chest which is clear to A and P bilaterally without cough on insp or exp maneuvers ? ? ?CV:  RRR  no s3 or murmur or increase in P2, and only trace ankle edema bilaterally  ? ?ABD:  soft and nontender with nl inspiratory excursion in the supine position. No bruits or organomegaly appreciated, bowel sounds nl ? ?MS:  Nl gait/ ext warm without deformities, calf tenderness, cyanosis or clubbing ?No obvious joint restrictions  ? ?SKIN: warm and dry without lesions   ? ?NEURO:  alert, approp, nl sensorium with  no motor or cerebellar deficits apparent.  ? ?  ?  ?  ?I personally reviewed images and agree with radiology impression as follows:  ?CXR:   05110 ?No active cardiopulmonary disease. Chronic mild increased pulmonary ?interstitium ?   ?Assessment  ?  ?   ?

## 2022-05-16 ENCOUNTER — Ambulatory Visit
Admission: EM | Admit: 2022-05-16 | Discharge: 2022-05-16 | Disposition: A | Discharge status: Home / Self Care | Payer: Medicare Other | Attending: Nurse Practitioner | Admitting: Nurse Practitioner

## 2022-05-16 ENCOUNTER — Ambulatory Visit (INDEPENDENT_AMBULATORY_CARE_PROVIDER_SITE_OTHER): Payer: Medicare Other

## 2022-05-16 ENCOUNTER — Encounter: Payer: Self-pay | Admitting: Emergency Medicine

## 2022-05-16 DIAGNOSIS — L03313 Cellulitis of chest wall: Secondary | ICD-10-CM

## 2022-05-16 DIAGNOSIS — R0789 Other chest pain: Secondary | ICD-10-CM

## 2022-05-16 DIAGNOSIS — Z23 Encounter for immunization: Secondary | ICD-10-CM | POA: Diagnosis not present

## 2022-05-16 DIAGNOSIS — I639 Cerebral infarction, unspecified: Secondary | ICD-10-CM | POA: Diagnosis not present

## 2022-05-16 MED ORDER — TETANUS-DIPHTH-ACELL PERTUSSIS 5-2.5-18.5 LF-MCG/0.5 IM SUSY
0.5000 mL | PREFILLED_SYRINGE | Freq: Once | INTRAMUSCULAR | Status: AC
  Administered 2022-05-16: 0.5 mL via INTRAMUSCULAR

## 2022-05-16 MED ORDER — CEPHALEXIN 500 MG PO CAPS: 500.0000 mg | ORAL_CAPSULE | Freq: Four times a day (QID) | ORAL | 0 refills | Status: AC

## 2022-05-16 NOTE — Discharge Instructions (Signed)
-   We have updated your tetanus shot today - Please start the antibiotic (Keflex) and keep the wound clean and dry.  Stop using peroxide and try to stop messing with the area.  - You can apply ice 15 minutes on, 45 minutes off while awake to help with the swelling

## 2022-05-16 NOTE — ED Provider Notes (Signed)
[facility]    CSN: [id number] Arrival date & time: 05/16/22  1117      History   Chief Complaint No chief complaint on file.   HPI Devin [name] is a 76 y.o. male.   Patient presents today for lump in his upper chest that started about 1 week ago.  Reports he and his son were working on his tractor when a wrench exploded and a small metal piece hit him in the chest.  The lump started immediately after.  He denies any nausea/vomiting, fevers, drainage from the area.  He reports the area is tender to touch and red.  He has been using hydrogen peroxide, picking at the scab which has not been helping much.  He is worried there may be a metal piece left inside his chest.  Does not know when he had his last tetanus shot.    Past Medical History:  Diagnosis Date   Arthritis    Asbestosis ([place])    CAD (coronary artery disease)    Multivessel status post CABG March 2016 - LIMA to LAD, SVG to OM1, SVG to OM2, and SVG to PDA.   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   Nocturia 10/17/2019   Erectile dysfunction due to arterial insufficiency 10/17/2019   Atrial fibrillation and flutter ([place]) 01/23/2015   Palpitations 01/23/2015   Hyperlipidemia 01/23/2015   BPH with urinary obstruction 01/23/2015   Chronic diastolic CHF (congestive heart failure) /HFpEF 01/23/2015   Atrial fibrillation with RVR ([place]) 01/23/2015   Obstructive sleep apnea--- refuses CPAP 01/18/2015   Essential hypertension 01/18/2015   Leg edema 01/18/2015   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   CAD (coronary artery disease), native coronary  artery/H/o CABG x 4 12/26/2014    Past Surgical History:  Procedure Laterality Date   ANTERIOR CERVICAL DECOMP/DISCECTOMY FUSION     CARDIOVERSION N/A 01/28/2015   Procedure: CARDIOVERSION;  Surgeon: Arnoldo Lenis, MD;  Location: [facility];  Service: Endoscopy;  Laterality: N/A;   CHOLECYSTECTOMY N/A 04/07/2020   Procedure: LAPAROSCOPIC CHOLECYSTECTOMY;  Surgeon: Virl Cagey, MD;  Location: [facility];  Service: General;  Laterality: N/A;   CORONARY ARTERY BYPASS GRAFT N/A 12/29/2014   Procedure: CORONARY ARTERY BYPASS GRAFTING (CABG), ON PUMP, TIMES FOUR, USING LEFT INTERNAL MAMMARY ARTERY, RIGHT GREATER SAPHENOUS VEIN HARVESTED ENDOSCOPICALLY;  Surgeon: Ivin Poot, MD;  Location: [place];  Service: Open Heart Surgery;  Laterality: N/A;   KNEE ARTHROSCOPY Right    KNEE CARTILAGE SURGERY Left    LEFT HEART CATHETERIZATION WITH CORONARY ANGIOGRAM N/A 12/28/2014   Procedure: LEFT HEART CATHETERIZATION WITH CORONARY ANGIOGRAM;  Surgeon: Lorretta Harp, MD;  [facility] LAB;  Service: Cardiovascular;  Laterality: N/A;   MELANOMA EXCISION     "back"   SHOULDER OPEN ROTATOR CUFF REPAIR Left    TEE WITHOUT CARDIOVERSION N/A 12/29/2014   Procedure: TRANSESOPHAGEAL ECHOCARDIOGRAM (TEE);  Surgeon: Ivin Poot, MD;  Location: [place];  Service: Open Heart Surgery;  Laterality: N/A;   TEE WITHOUT CARDIOVERSION N/A 01/28/2015   Procedure: TRANSESOPHAGEAL ECHOCARDIOGRAM (TEE);  Surgeon: Arnoldo Lenis, MD;  Location: [facility];  Service: Endoscopy;  Laterality: N/A;       Home Medications    Prior to Admission medications   Medication Sig Start Date End Date Taking? Authorizing Provider  cephALEXin (KEFLEX) 500 MG capsule Take 1 capsule (500 mg total) by mouth 4 (four) times daily for 5 days. 05/16/22 05/21/22 Yes Eulogio Bear, NP  aspirin 81 MG tablet Take 1 tablet (81 mg total) by mouth daily with breakfast. For Heart 04/08/20   Roxan Hockey, MD  atorvastatin (LIPITOR) 10 MG  tablet Take 10 mg by mouth daily.    [provider]  clonazePAM (KLONOPIN) 0.5 MG tablet 0.5 mg at bedtime.  02/22/15   [provider]  cyanocobalamin (,VITAMIN B-12,) 1000 MCG/ML injection INJECT 1ML AS DIRECTED EVERY 4 WEEKS 06/10/19   [provider]  doxazosin (CARDURA) 4 MG tablet TAKE 1 TABLET(4 MG) BY MOUTH IN THE MORNING AND AT BEDTIME 01/31/22   Irine Seal, MD  famotidine (PEPCID) 20 MG tablet One after supper 02/17/22   Tanda Rockers, MD  metoprolol succinate (TOPROL XL) 25 MG 24 hr tablet Take 1 tablet (25 mg total) by mouth daily. For Heart 04/08/20 04/08/21  Roxan Hockey, MD  naproxen sodium (ALEVE) 220 MG tablet Take 220 mg by mouth as needed.    [provider]  pantoprazole (PROTONIX) 40 MG tablet Take 1 tablet (40 mg total) by mouth daily. Take 30-60 min before first meal of the day 02/17/22   Tanda Rockers, MD  PROAIR HFA 108 [phone] Base) MCG/ACT inhaler Inhale 2 puffs into the lungs every 4 (four) hours as needed for wheezing or shortness of breath. 04/08/20   Roxan Hockey, MD  testosterone cypionate (DEPOTESTOSTERONE CYPIONATE) 200 MG/ML injection ADMINISTER 2 ML(400 MG) IN THE MUSCLE EVERY 14 DAYS 12/29/21   Irine Seal, MD  torsemide (DEMADEX) 20 MG tablet Take 10 mg by mouth daily.    [provider]    Family History Family History  Problem Relation Age of Onset   Stroke Mother    Hypertension Mother    Heart disease Sister        Died from complications of RHD   Heart disease Brother        Heart failure related to EtOH and smoking   Stomach cancer Sister     Social History Social History   Tobacco Use   Smoking status: Never   Smokeless tobacco: Never  Vaping Use   Vaping Use: Never used  Substance Use Topics   Alcohol use: Yes    Alcohol/week: 0.0 standard drinks of alcohol    Comment: Occasional   Drug use: No     Allergies   Lasix [furosemide]   Review of Systems Review of Systems Per  HPI  Physical Exam Triage Vital Signs ED Triage Vitals  Enc Vitals Group     BP 05/16/22 1123 (!) 145/77     Pulse Rate 05/16/22 1123 67     Resp 05/16/22 1123 18     Temp 05/16/22 1123 97.8 F (36.6 C)     Temp Source 05/16/22 1123 Oral     SpO2 05/16/22 1123 95 %     Weight --      Height --      Head Circumference --      Peak Flow --      Pain Score 05/16/22 1125 0     Pain Loc --  Pain Edu? --      Excl. in [place]? --    No data found.  Updated Vital Signs BP (!) 145/77 (BP Location: Right Arm)   Pulse 67   Temp 97.8 F (36.6 C) (Oral)   Resp 18   SpO2 95%   Visual Acuity Right Eye Distance:   Left Eye Distance:   Bilateral Distance:    Right Eye Near:   Left Eye Near:    Bilateral Near:     Physical Exam Vitals and nursing note reviewed.  Constitutional:      General: He is not in acute distress.    Appearance: Normal appearance. He is not toxic-appearing.  HENT:     Head: Normocephalic and atraumatic.  Eyes:     General: No scleral icterus.    Extraocular Movements: Extraocular movements intact.  Pulmonary:     Effort: Pulmonary effort is normal. No respiratory distress.  Chest:     Chest wall: Mass and swelling present. No deformity, tenderness or edema.       Comments: Distinct, approximately 1 cm x 1 cm hardened, circular mass just superior to jugular notch as depicted.  There is no surrounding erythema, fluctuance, warmth, active drainage.  The mass is slightly tender to palpation. Skin:    General: Skin is warm and dry.     Coloration: Skin is not jaundiced or pale.     Findings: No erythema.  Neurological:     Mental Status: He is alert and oriented to person, place, and time.  Psychiatric:        Behavior: Behavior is cooperative.      UC Treatments / Results  Labs (all labs ordered are listed, but only abnormal results are displayed) Labs Reviewed - No data to display  EKG   Radiology DG Chest 2 View  Result Date:  05/16/2022 CLINICAL DATA:  Stroke in the chest by a wrench, has a knot above the jugular notch EXAM: CHEST - 2 VIEW COMPARISON:  02/17/2022 FINDINGS: Unchanged cardiac and mediastinal contours. Status post median sternotomy. Left basilar opacities, most likely atelectasis. No pleural effusion or pneumothorax. No acute osseous abnormality. IMPRESSION: No acute cardiopulmonary process. No definite radiographic abnormality is seen to correlate with the palpated abnormality described. Electronically Signed   By: Merilyn Baba M.D.   On: 05/16/2022 12:02    Procedures Procedures (including critical care time)  Medications Ordered in UC Medications  Tdap (BOOSTRIX) injection 0.5 mL (0.5 mLs Intramuscular Given 05/16/22 1222)    Initial Impression / Assessment and Plan / UC Course  I have reviewed the triage vital signs and the nursing notes.  Pertinent labs & imaging results that were available during my care of the patient were reviewed by me and considered in my medical decision making (see chart for details).    Patient is a very pleasant, well-appearing 76 year old male presenting for mass of upper chest.  Vital signs are stable today and patient is nontoxic.  Chest x-ray today does not show any definite radiographic abnormality or foreign body.  We will treat the patient for cellulitis with Keflex 500 mg 4 times daily for 5 days.  Tetanus shot updated today.  Discussed wound care.  Discussed use of ice.  Seek care if symptoms persist worsen despite treatment. Final Clinical Impressions(s) / UC Diagnoses   Final diagnoses:  Cellulitis of chest wall     Discharge Instructions      - We have updated your tetanus shot today - Please  start the antibiotic (Keflex) and keep the wound clean and dry.  Stop using peroxide and try to stop messing with the area.  - You can apply ice 15 minutes on, 45 minutes off while awake to help with the swelling    ED Prescriptions     Medication Sig  Dispense Auth. Provider   cephALEXin (KEFLEX) 500 MG capsule Take 1 capsule (500 mg total) by mouth 4 (four) times daily for 5 days. 20 capsule Eulogio Bear, NP      PDMP not reviewed this encounter.   Eulogio Bear, NP 05/16/22 1402

## 2022-05-16 NOTE — ED Triage Notes (Signed)
Wrench exploded and a piece hit him in the upper chest area below neck last week.  Hurts to move arms and hurts at night time.  States he feels a knot coming up on the left side of neck.

## 2022-05-19 DIAGNOSIS — J399 Disease of upper respiratory tract, unspecified: Secondary | ICD-10-CM | POA: Diagnosis not present

## 2022-05-19 DIAGNOSIS — R531 Weakness: Secondary | ICD-10-CM | POA: Diagnosis not present

## 2022-05-19 DIAGNOSIS — I10 Essential (primary) hypertension: Secondary | ICD-10-CM | POA: Diagnosis not present

## 2022-05-20 ENCOUNTER — Other Ambulatory Visit: Payer: Self-pay | Admitting: Internal Medicine

## 2022-06-28 ENCOUNTER — Other Ambulatory Visit: Payer: Medicare Other

## 2022-06-28 ENCOUNTER — Encounter (HOSPITAL_COMMUNITY): Payer: Self-pay | Admitting: Physical Therapy

## 2022-06-28 ENCOUNTER — Ambulatory Visit (HOSPITAL_COMMUNITY): Payer: Medicare Other | Attending: Internal Medicine | Admitting: Physical Therapy

## 2022-06-28 DIAGNOSIS — D751 Secondary polycythemia: Secondary | ICD-10-CM

## 2022-06-28 DIAGNOSIS — M6281 Muscle weakness (generalized): Secondary | ICD-10-CM | POA: Insufficient documentation

## 2022-06-28 DIAGNOSIS — R262 Difficulty in walking, not elsewhere classified: Secondary | ICD-10-CM | POA: Diagnosis not present

## 2022-06-28 DIAGNOSIS — E291 Testicular hypofunction: Secondary | ICD-10-CM | POA: Diagnosis not present

## 2022-06-28 NOTE — Therapy (Signed)
OUTPATIENT PHYSICAL THERAPY LOWER EXTREMITY EVALUATION   Patient Name: Devin [name] MRN: [id number] DOB:October 12, 1946, 76 y.o., male Today's Date: 06/28/2022   PT End of Session - 06/28/22 0953     Visit Number 1    Number of Visits 12    Date for PT Re-Evaluation 08/09/22    Authorization Type Medicare Part A    Progress Note Due on Visit 10    PT Start Time [phone]    PT Stop Time 1031    PT Time Calculation (min) 48 min    Activity Tolerance Patient tolerated treatment well    Behavior During Therapy [facility] for tasks assessed/performed             Past Medical History:  Diagnosis Date   Arthritis    Asbestosis ([place])    CAD (coronary artery disease)    Multivessel status post CABG March 2016 - LIMA to LAD, SVG to OM1, SVG to OM2, and SVG to PDA.   Essential hypertension    GERD (gastroesophageal reflux disease)    Melanoma of back ([facility])    Prostatic hypertrophy    Sleep apnea    Intolerant of CPAP   Past Surgical History:  Procedure Laterality Date   ANTERIOR CERVICAL DECOMP/DISCECTOMY FUSION     CARDIOVERSION N/A 01/28/2015   Procedure: CARDIOVERSION;  Surgeon: Arnoldo Lenis, MD;  Location: [facility];  Service: Endoscopy;  Laterality: N/A;   CHOLECYSTECTOMY N/A 04/07/2020   Procedure: LAPAROSCOPIC CHOLECYSTECTOMY;  Surgeon: Virl Cagey, MD;  Location: [facility];  Service: General;  Laterality: N/A;   CORONARY ARTERY BYPASS GRAFT N/A 12/29/2014   Procedure: CORONARY ARTERY BYPASS GRAFTING (CABG), ON PUMP, TIMES FOUR, USING LEFT INTERNAL MAMMARY ARTERY, RIGHT GREATER SAPHENOUS VEIN HARVESTED ENDOSCOPICALLY;  Surgeon: Ivin Poot, MD;  Location: [place];  Service: Open Heart Surgery;  Laterality: N/A;   KNEE ARTHROSCOPY Right    KNEE CARTILAGE SURGERY Left    LEFT HEART CATHETERIZATION WITH CORONARY ANGIOGRAM N/A 12/28/2014   Procedure: LEFT HEART CATHETERIZATION WITH CORONARY ANGIOGRAM;  Surgeon: Lorretta Harp, MD;  Location: [facility] CATH LAB;  Service: Cardiovascular;   Laterality: N/A;   MELANOMA EXCISION     "back"   SHOULDER OPEN ROTATOR CUFF REPAIR Left    TEE WITHOUT CARDIOVERSION N/A 12/29/2014   Procedure: TRANSESOPHAGEAL ECHOCARDIOGRAM (TEE);  Surgeon: Ivin Poot, MD;  Location: [place];  Service: Open Heart Surgery;  Laterality: N/A;   TEE WITHOUT CARDIOVERSION N/A 01/28/2015   Procedure: TRANSESOPHAGEAL ECHOCARDIOGRAM (TEE);  Surgeon: Arnoldo Lenis, MD;  Location: [facility];  Service: Endoscopy;  Laterality: N/A;   Patient Active Problem List   Diagnosis Date Noted   Cough variant asthma vs UACS  02/17/2022   DOE (dyspnea on exertion) 12/07/2020   Calculus of gallbladder with acute cholecystitis without obstruction 04/06/2020   Hypogonadism in male 10/17/2019   Nocturia 10/17/2019   Erectile dysfunction due to arterial insufficiency 10/17/2019   Atrial fibrillation and flutter ([place]) 01/23/2015   Palpitations 01/23/2015   Hyperlipidemia 01/23/2015   BPH with urinary obstruction 01/23/2015   Chronic diastolic CHF (congestive heart failure) /HFpEF 01/23/2015   Atrial fibrillation with RVR ([place]) 01/23/2015   Obstructive sleep apnea--- refuses CPAP 01/18/2015   Essential hypertension 01/18/2015   Leg edema 01/18/2015   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   CAD (coronary artery disease), native coronary artery/H/o CABG x 4 12/26/2014    PCP: [name], MD   REFERRING PROVIDER: [name], MD   REFERRING  DIAG: R53.1 (ICD-10-CM) - Weakness   THERAPY DIAG:  Generalized weakness, difficulty walking  Rationale for Evaluation and Treatment Rehabilitation  ONSET DATE: 2020  SUBJECTIVE:   SUBJECTIVE STATEMENT: Patient reports history of lung issues including asbestosis. Developed COIVD in 2020 and noticed a decline in his functional capacity, limiting his tolerance with prolonged activities, especially walking. Cannot stand for long periods of time, <5 minutes before he needs to change position or bend forward. Leaning over improves symptoms. Works  6x/week, required to lift heavy hay bails. Ambulation limited to less than 3 minutes before he needs to sit, combination of LE fatigue and dyspnea.   PERTINENT HISTORY: CABG x4, hx of COVID, asbestosis   PAIN:  Are you having pain? No  PRECAUTIONS: None  WEIGHT BEARING RESTRICTIONS No  FALLS:  Has patient fallen in last 6 months? No  LIVING ENVIRONMENT: Lives with: lives with their family and lives with their spouse Lives in: House/apartment Stairs: Yes: External: 1-2 steps; none (split level inside 10 steps, rail on Rt) Has following equipment at home: Single point cane and Crutches  OCCUPATION: retired, farming currently  PLOF: [place] Be able to walk with his wife on a trail. 3 miles would be ideal.   OBJECTIVE:     COGNITION:  Overall cognitive status: Within functional limits for tasks assessed     SENSATION: WFL  POSTURE: rounded shoulders    Lumbar ROM:   LUMBAR ROM:   Active  A/PROM  eval  Flexion 70% limited  Extension 50% limited  Right lateral flexion 50% limited  Left lateral flexion 25% limited  Right rotation   Left rotation    (Blank rows = not tested)  LOWER EXTREMITY MMT:  MMT Right eval Left eval  Hip flexion 5 4+  Hip extension    Hip abduction 5 5  Hip adduction    Hip internal rotation    Hip external rotation    Knee flexion    Knee extension 5 4+  Ankle dorsiflexion 5 4  Ankle plantarflexion    Ankle inversion    Ankle eversion     (Blank rows = not tested)    FUNCTIONAL TESTS:  5 times sit to stand: 14.1 sec 2 minute walk test: 407 feet no AD - SpO2 94% on RA HR 109  GAIT: Distance walked: 407 Assistive device utilized: None Level of assistance: Complete Independence Comments: Slightly decreased stance time on Lt, intermittent drift from midline without overt instability or LOB    TODAY'S TREATMENT: Eval 2MWT 5x sit to stand MMT HEP Education   PATIENT EDUCATION:  Education  details: Findings, POC, symptom awareness, HEP Person educated: Patient Education method: Explanation Education comprehension: verbalized understanding   HOME EXERCISE PROGRAM: Access Code: [email or web address] URL: [email or web address] Date: 06/28/2022 Prepared by: Candie [facility]  Exercises - Supine Transversus Abdominis Bracing - Hands on Stomach  - 2 x daily - 7 x weekly - 2 sets - 10 reps - Small Range Straight Leg Raise  - 2 x daily - 7 x weekly - 2 sets - 10 reps - Standing Shoulder Row with Anchored Resistance  - 2 x daily - 7 x weekly - 2 sets - 10 reps  ASSESSMENT:  CLINICAL IMPRESSION: Patient is a 76 y.o. male who was seen today for physical therapy evaluation and treatment for lower extremity weakness, difficulty walking, and reduced tolerance with functional activities involving prolonged standing and walking. Demonstrates reduced lumbar ROM without pain, LLE weakness, reduced tolerance with  upright activities. Patient will benefit from skilled PT intervention to address deficits listed below and improve his functional abilities for required daily activities.    OBJECTIVE IMPAIRMENTS Abnormal gait, cardiopulmonary status limiting activity, decreased activity tolerance, decreased endurance, decreased knowledge of condition, decreased knowledge of use of DME, decreased mobility, difficulty walking, decreased ROM, decreased strength, and impaired flexibility.   ACTIVITY LIMITATIONS carrying, lifting, bending, and standing  PARTICIPATION LIMITATIONS: cleaning, community activity, occupation, and yard work  PERSONAL FACTORS Age, Time since onset of injury/illness/exacerbation, and multiple medical issues including CABG and asbestosis hx  are also affecting patient's functional outcome.   REHAB POTENTIAL: Good  CLINICAL DECISION MAKING: Stable/uncomplicated  EVALUATION COMPLEXITY: Low   GOALS: Goals reviewed with patient? Yes  SHORT TERM GOALS: Target date:  07/19/2022  Patient will be independent with initial HEP and self-management strategies to improve functional outcomes Baseline: Initiated Goal status: INITIAL    LONG TERM GOALS: Target date: 08/09/2022  Patient will be independent with advanced HEP and self-management strategies to improve functional outcomes Baseline: Initiated STG at eval Goal status: INITIAL  2.  Patient will improve 5 time sit to stand score to <12 sec seconds or less value to indicate improvement in functional outcomes Baseline: 14 seconds Goal status: INITIAL  3.  Patient will report ability to ambulate 1 mile with wife on trails for routine exercise and maintenance of symptoms with LRAD. Baseline: Limited to 3 min walk, approx 100 yrd Goal status: INITIAL  4. Patient will have equal to or >4+/5 MMT throughout BIL LEs to improve ability to perform functional mobility, stair ambulation and ADLs.  Baseline: See above Goal status: INITIAL     PLAN: PT FREQUENCY: 2x/week  PT DURATION: 6 weeks  PLANNED INTERVENTIONS: Therapeutic exercises, Therapeutic activity, Neuromuscular re-education, Balance training, Gait training, Patient/Family education, Self Care, Joint mobilization, Stair training, and Spinal mobilization  PLAN FOR NEXT SESSION: Progressive core strength, gross LE strengthening, lumbar ROM, progress functional mobility and tolerance with prolonged upright activities.   10:59 AM, 06/28/22 Josue Hector PT DPT  Physical Therapist with [facility]  [phone]

## 2022-06-29 ENCOUNTER — Other Ambulatory Visit: Payer: Medicare Other

## 2022-06-29 LAB — HEMOGLOBIN AND HEMATOCRIT, BLOOD
Hematocrit: 42.8 % (ref 37.5–51.0)
Hemoglobin: 14.4 g/dL (ref 13.0–17.7)

## 2022-06-29 LAB — TESTOSTERONE: Testosterone: 103 ng/dL — ABNORMAL LOW (ref 264–916)

## 2022-07-04 ENCOUNTER — Encounter (HOSPITAL_COMMUNITY): Payer: Medicare Other

## 2022-07-06 ENCOUNTER — Ambulatory Visit (INDEPENDENT_AMBULATORY_CARE_PROVIDER_SITE_OTHER): Payer: Medicare Other | Admitting: Urology

## 2022-07-06 ENCOUNTER — Encounter: Payer: Self-pay | Admitting: Urology

## 2022-07-06 VITALS — BP 105/55 | HR 79

## 2022-07-06 DIAGNOSIS — N401 Enlarged prostate with lower urinary tract symptoms: Secondary | ICD-10-CM

## 2022-07-06 DIAGNOSIS — N5201 Erectile dysfunction due to arterial insufficiency: Secondary | ICD-10-CM

## 2022-07-06 DIAGNOSIS — D751 Secondary polycythemia: Secondary | ICD-10-CM | POA: Diagnosis not present

## 2022-07-06 DIAGNOSIS — R350 Frequency of micturition: Secondary | ICD-10-CM | POA: Diagnosis not present

## 2022-07-06 DIAGNOSIS — E291 Testicular hypofunction: Secondary | ICD-10-CM

## 2022-07-06 DIAGNOSIS — N138 Other obstructive and reflux uropathy: Secondary | ICD-10-CM | POA: Diagnosis not present

## 2022-07-06 LAB — URINALYSIS, ROUTINE W REFLEX MICROSCOPIC
Bilirubin, UA: NEGATIVE
Glucose, UA: NEGATIVE
Ketones, UA: NEGATIVE
Leukocytes,UA: NEGATIVE
Nitrite, UA: NEGATIVE
Protein,UA: NEGATIVE
RBC, UA: NEGATIVE
Specific Gravity, UA: 1.025 (ref 1.005–1.030)
Urobilinogen, Ur: 0.2 mg/dL (ref 0.2–1.0)
pH, UA: 5 (ref 5.0–7.5)

## 2022-07-06 MED ORDER — TESTOSTERONE CYPIONATE 200 MG/ML IM SOLN: INTRAMUSCULAR | 1 refills | Status: DC

## 2022-07-06 MED ORDER — DOXAZOSIN MESYLATE 4 MG PO TABS: ORAL_TABLET | ORAL | 3 refills | Status: DC

## 2022-07-06 NOTE — Progress Notes (Signed)
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 Acquired polycythemia   3. BPH with urinary obstruction   4. Urinary frequency   5. Erectile dysfunction due to arterial insufficiency      Devin [name] returns today in f/u.  He has been on TRT with '400mg'$  IM q2wks.  His T is 103 on 06/28/22 which was a trough level.  His Hgb is 14.4 and his Hct is 42.8.   He has noticed with the last 2 shots that he gets and little dizzy and has some blurred vision but that abated and he is doing well on therapy. His BP is low this morning at 105/55 and he feels that is too low for him.   He has had progressive ED and has not been using the VED.  He is able to function with a partial erection.  He has some questions about IPP.   He remains on doxazosin '4mg'$  bid for his voiding symptoms.   His IPSS is 9.     His last PSA was 1.7 with a 28% f/t ratio which is minimally changed.         IPSS     Row Name 07/06/22 1000         International Prostate Symptom Score   How often have you had the sensation of not emptying your bladder? Less than 1 in 5     How often have you had to urinate less than every two hours? Less than half the time     How often have you found you stopped and started again several times when you urinated? Less than 1 in 5 times     How often have you found it difficult to postpone urination? Less than half the time     How often have you had a weak urinary stream? Less than 1 in 5 times     How often have you had to strain to start urination? Less than 1 in 5 times     How many times did you typically get up at night to urinate? 1 Time     Total IPSS Score 9       Quality of Life due to urinary symptoms   If you were to spend the rest of your life with your urinary condition just the way it is now how would you feel about that? Pleased                  ROS:  Review of Systems  Constitutional:  Positive for malaise/fatigue and weight loss.  HENT:  Positive for congestion.    Respiratory:  Positive for cough and shortness of breath.   Cardiovascular:  Positive for leg swelling.  Musculoskeletal:  Positive for back pain and joint pain.  Skin:  Positive for itching.    Allergies  Allergen Reactions   Lasix [Furosemide] Nausea Only    Outpatient Encounter Medications as of 07/06/2022  Medication Sig Note   aspirin 81 MG tablet Take 1 tablet (81 mg total) by mouth daily with breakfast. For Heart    atorvastatin (LIPITOR) 10 MG tablet Take 10 mg by mouth daily.    clonazePAM (KLONOPIN) 0.5 MG tablet 0.5 mg at bedtime.  02/24/2015: Received from: External Pharmacy   cyanocobalamin (,VITAMIN B-12,) 1000 MCG/ML injection INJECT 1ML AS DIRECTED EVERY 4 WEEKS 04/06/2020: Was due to take this today   doxazosin (CARDURA) 4 MG tablet TAKE 1 TABLET(4 MG) BY MOUTH IN THE MORNING AND AT  BEDTIME    famotidine (PEPCID) 20 MG tablet One after supper    metoprolol succinate (TOPROL XL) 25 MG 24 hr tablet Take 1 tablet (25 mg total) by mouth daily. For Heart    naproxen sodium (ALEVE) 220 MG tablet Take 220 mg by mouth as needed.    pantoprazole (PROTONIX) 40 MG tablet TAKE 1 TABLET (40 MG TOTAL) BY MOUTH DAILY. TAKE 30-60 MIN BEFORE FIRST MEAL OF THE DAY    PROAIR HFA 108 (90 Base) MCG/ACT inhaler Inhale 2 puffs into the lungs every 4 (four) hours as needed for wheezing or shortness of breath.    testosterone cypionate (DEPOTESTOSTERONE CYPIONATE) 200 MG/ML injection ADMINISTER 2 ML(400 MG) IN THE MUSCLE EVERY 14 DAYS    torsemide (DEMADEX) 20 MG tablet Take 10 mg by mouth daily.    [DISCONTINUED] doxazosin (CARDURA) 4 MG tablet TAKE 1 TABLET(4 MG) BY MOUTH IN THE MORNING AND AT BEDTIME    [DISCONTINUED] testosterone cypionate (DEPOTESTOSTERONE CYPIONATE) 200 MG/ML injection ADMINISTER 2 ML(400 MG) IN THE MUSCLE EVERY 14 DAYS    No facility-administered encounter medications on file as of 07/06/2022.    Past Medical History:  Diagnosis Date   Arthritis    Asbestosis ([place])     CAD (coronary artery disease)    Multivessel status post CABG March 2016 - LIMA to LAD, SVG to OM1, SVG to OM2, and SVG to PDA.   Essential hypertension    GERD (gastroesophageal reflux disease)    Melanoma of back ([facility])    Prostatic hypertrophy    Sleep apnea    Intolerant of CPAP    Past Surgical History:  Procedure Laterality Date   ANTERIOR CERVICAL DECOMP/DISCECTOMY FUSION     CARDIOVERSION N/A 01/28/2015   Procedure: CARDIOVERSION;  Surgeon: Arnoldo Lenis, MD;  Location: [facility];  Service: Endoscopy;  Laterality: N/A;   CHOLECYSTECTOMY N/A 04/07/2020   Procedure: LAPAROSCOPIC CHOLECYSTECTOMY;  Surgeon: Virl Cagey, MD;  Location: [facility];  Service: General;  Laterality: N/A;   CORONARY ARTERY BYPASS GRAFT N/A 12/29/2014   Procedure: CORONARY ARTERY BYPASS GRAFTING (CABG), ON PUMP, TIMES FOUR, USING LEFT INTERNAL MAMMARY ARTERY, RIGHT GREATER SAPHENOUS VEIN HARVESTED ENDOSCOPICALLY;  Surgeon: Ivin Poot, MD;  Location: [place];  Service: Open Heart Surgery;  Laterality: N/A;   KNEE ARTHROSCOPY Right    KNEE CARTILAGE SURGERY Left    LEFT HEART CATHETERIZATION WITH CORONARY ANGIOGRAM N/A 12/28/2014   Procedure: LEFT HEART CATHETERIZATION WITH CORONARY ANGIOGRAM;  Surgeon: Lorretta Harp, MD;  Location: [facility] CATH LAB;  Service: Cardiovascular;  Laterality: N/A;   MELANOMA EXCISION     "back"   SHOULDER OPEN ROTATOR CUFF REPAIR Left    TEE WITHOUT CARDIOVERSION N/A 12/29/2014   Procedure: TRANSESOPHAGEAL ECHOCARDIOGRAM (TEE);  Surgeon: Ivin Poot, MD;  Location: [place];  Service: Open Heart Surgery;  Laterality: N/A;   TEE WITHOUT CARDIOVERSION N/A 01/28/2015   Procedure: TRANSESOPHAGEAL ECHOCARDIOGRAM (TEE);  Surgeon: Arnoldo Lenis, MD;  Location: [facility];  Service: Endoscopy;  Laterality: N/A;    Social History   Socioeconomic History   Marital status: Married    Spouse name: Not on file   Number of children: 7   Years of education: Not on file   Highest education  level: Not on file  Occupational History   Not on file  Tobacco Use   Smoking status: Never   Smokeless tobacco: Never  Vaping Use   Vaping Use: Never used  Substance and Sexual Activity  Alcohol use: Yes    Alcohol/week: 0.0 standard drinks of alcohol    Comment: Occasional   Drug use: No   Sexual activity: Yes    Partners: Female  Other Topics Concern   Not on file  Social History Narrative   Lives at home with wife.  They have seven children.     Social Determinants of Health   Financial Resource Strain: Not on file  Food Insecurity: Not on file  Transportation Needs: Not on file  Physical Activity: Not on file  Stress: Not on file  Social Connections: Not on file  Intimate Partner Violence: Not on file    Family History  Problem Relation Age of Onset   Stroke Mother    Hypertension Mother    Heart disease Sister        Died from complications of RHD   Heart disease Brother        Heart failure related to EtOH and smoking   Stomach cancer Sister        Objective: Vitals:   07/06/22 1025  BP: (!) 105/55  Pulse: 79     Physical Exam  Recent Results (from the past 2160 hour(s))  Hemoglobin and hematocrit, blood     Status: None   Collection Time: 06/28/22  9:08 AM  Result Value Ref Range   Hemoglobin 14.4 13.0 - 17.7 g/dL   Hematocrit 42.8 37.5 - 51.0 %  Testosterone     Status: Abnormal   Collection Time: 06/28/22  9:08 AM  Result Value Ref Range   Testosterone 103 (L) 264 - 916 ng/dL    Comment: Adult male reference interval is based on a population of healthy nonobese males (BMI <30) between 25 and 61 years old. [place], [place] [phone]. PMID: [id number].   Urinalysis, Routine w reflex microscopic     Status: None   Collection Time: 07/06/22 10:25 AM  Result Value Ref Range   Specific Gravity, UA 1.025 1.005 - 1.030   pH, UA 5.0 5.0 - 7.5   Color, UA Yellow Yellow   Appearance Ur Clear Clear   Leukocytes,UA Negative  Negative   Protein,UA Negative Negative/Trace   Glucose, UA Negative Negative   Ketones, UA Negative Negative   RBC, UA Negative Negative   Bilirubin, UA Negative Negative   Urobilinogen, Ur 0.2 0.2 - 1.0 mg/dL   Nitrite, UA Negative Negative   Microscopic Examination Comment     Comment: Microscopic follows if indicated.   Lab Results  Component Value Date   PSA1 1.7 12/22/2021   UA is clear.     Studies/Results:     Assessment & Plan: Hypogonadism.  His is doing well on current therapy.  Med refilled.  F/U in 6 months with labs.  Secondary Polycythemia.  His Hgb is down after a blood donation.  BPH with BOO.  He is doing well on doxazosin and his script is current.   He has had some low BP and dizziness and I discussed changing therapy but he wants to stay the course.  PSA was 1.7 at last check.  His prostate is 19m without a middle lobe.   ED.  He is getting by on no treatment.  He doesn't like the VED.  We discussed an IPP and he will let me know if he wants a referral.     Orders Placed This Encounter  Procedures   Urinalysis, Routine w reflex microscopic   Hemoglobin and hematocrit, blood    Standing Status:  Future    Standing Expiration Date:   07/07/2023   Testosterone    Standing Status:   Future    Standing Expiration Date:   07/07/2023   PSA, total and free    Standing Status:   Future    Standing Expiration Date:   07/07/2023    Return in about 6 months (around 01/04/2023) for with labs. .   CC: [name], MD       Irine Seal 07/07/2022 Patient ID: Devin [name], male   DOB: 05-07-1946, 76 y.o.   MRN: [id number]

## 2022-07-07 ENCOUNTER — Ambulatory Visit (HOSPITAL_COMMUNITY): Payer: Medicare Other | Attending: Internal Medicine

## 2022-07-07 DIAGNOSIS — R262 Difficulty in walking, not elsewhere classified: Secondary | ICD-10-CM | POA: Insufficient documentation

## 2022-07-07 DIAGNOSIS — M6281 Muscle weakness (generalized): Secondary | ICD-10-CM | POA: Diagnosis not present

## 2022-07-07 NOTE — Therapy (Signed)
OUTPATIENT PHYSICAL THERAPY LOWER EXTREMITY EVALUATION   Patient Name: Devin [name] MRN: [id number] DOB:12-Feb-1946, 76 y.o., male Today's Date: 07/07/2022   PT End of Session - 07/07/22 0910     Visit Number 2    Number of Visits 12    Date for PT Re-Evaluation 08/09/22    Authorization Type Medicare Part A    Progress Note Due on Visit 10    PT Start Time 0906    PT Stop Time 0945    PT Time Calculation (min) 39 min    Activity Tolerance Patient tolerated treatment well    Behavior During Therapy [facility] for tasks assessed/performed              Past Medical History:  Diagnosis Date   Arthritis    Asbestosis ([place])    CAD (coronary artery disease)    Multivessel status post CABG March 2016 - LIMA to LAD, SVG to OM1, SVG to OM2, and SVG to PDA.   Essential hypertension    GERD (gastroesophageal reflux disease)    Melanoma of back (HCC)    Prostatic hypertrophy    Sleep apnea    Intolerant of CPAP   Past Surgical History:  Procedure Laterality Date   ANTERIOR CERVICAL DECOMP/DISCECTOMY FUSION     CARDIOVERSION N/A 01/28/2015   Procedure: CARDIOVERSION;  Surgeon: Arnoldo Lenis, MD;  Location: [facility];  Service: Endoscopy;  Laterality: N/A;   CHOLECYSTECTOMY N/A 04/07/2020   Procedure: LAPAROSCOPIC CHOLECYSTECTOMY;  Surgeon: Virl Cagey, MD;  Location: [facility];  Service: General;  Laterality: N/A;   CORONARY ARTERY BYPASS GRAFT N/A 12/29/2014   Procedure: CORONARY ARTERY BYPASS GRAFTING (CABG), ON PUMP, TIMES FOUR, USING LEFT INTERNAL MAMMARY ARTERY, RIGHT GREATER SAPHENOUS VEIN HARVESTED ENDOSCOPICALLY;  Surgeon: Ivin Poot, MD;  Location: [place];  Service: Open Heart Surgery;  Laterality: N/A;   KNEE ARTHROSCOPY Right    KNEE CARTILAGE SURGERY Left    LEFT HEART CATHETERIZATION WITH CORONARY ANGIOGRAM N/A 12/28/2014   Procedure: LEFT HEART CATHETERIZATION WITH CORONARY ANGIOGRAM;  Surgeon: Lorretta Harp, MD;  Location: [facility] CATH LAB;  Service:  Cardiovascular;  Laterality: N/A;   MELANOMA EXCISION     "back"   SHOULDER OPEN ROTATOR CUFF REPAIR Left    TEE WITHOUT CARDIOVERSION N/A 12/29/2014   Procedure: TRANSESOPHAGEAL ECHOCARDIOGRAM (TEE);  Surgeon: Ivin Poot, MD;  Location: [place];  Service: Open Heart Surgery;  Laterality: N/A;   TEE WITHOUT CARDIOVERSION N/A 01/28/2015   Procedure: TRANSESOPHAGEAL ECHOCARDIOGRAM (TEE);  Surgeon: Arnoldo Lenis, MD;  Location[facility];  Service: Endoscopy;  Laterality: N/A;   Patient Active Problem List   Diagnosis Date Noted   Cough variant asthma vs UACS  02/17/2022   DOE (dyspnea on exertion) 12/07/2020   Calculus of gallbladder with acute cholecystitis without obstruction 04/06/2020   Hypogonadism in male 10/17/2019   Nocturia 10/17/2019   Erectile dysfunction due to arterial insufficiency 10/17/2019   Atrial fibrillation and flutter ([place]) 01/23/2015   Palpitations 01/23/2015   Hyperlipidemia 01/23/2015   BPH with urinary obstruction 01/23/2015   Chronic diastolic CHF (congestive heart failure) /HFpEF 01/23/2015   Atrial fibrillation with RVR ([place]) 01/23/2015   Obstructive sleep apnea--- refuses CPAP 01/18/2015   Essential hypertension 01/18/2015   Leg edema 01/18/2015   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   CAD (coronary artery disease), native coronary artery/H/o CABG x 4 12/26/2014    PCP: [name], MD   REFERRING PROVIDER: [name], MD  REFERRING DIAG: R53.1 (ICD-10-CM) - Weakness   THERAPY DIAG:  Generalized weakness, difficulty walking  Rationale for Evaluation and Treatment Rehabilitation  ONSET DATE: 2020  SUBJECTIVE:   SUBJECTIVE STATEMENT: Patient reports he felt a little lightheaded yesterday.  Able to perform HEP without issue. Reports he feels his lung issues limit his walking, general activity.   Eval:Patient reports history of lung issues including asbestosis. Developed COIVD in 2020 and noticed a decline in his functional capacity, limiting his tolerance  with prolonged activities, especially walking. Cannot stand for long periods of time, <5 minutes before he needs to change position or bend forward. Leaning over improves symptoms. Works 6x/week, required to lift heavy hay bails. Ambulation limited to less than 3 minutes before he needs to sit, combination of LE fatigue and dyspnea.   PERTINENT HISTORY: CABG x4, hx of COVID, asbestosis   PAIN:  Are you having pain? No  PRECAUTIONS: None  WEIGHT BEARING RESTRICTIONS No  FALLS:  Has patient fallen in last 6 months? No  LIVING ENVIRONMENT: Lives with: lives with their family and lives with their spouse Lives in: House/apartment Stairs: Yes: External: 1-2 steps; none (split level inside 10 steps, rail on Rt) Has following equipment at home: Single point cane and Crutches  OCCUPATION: retired, farming currently  PLOF: [place] Be able to walk with his wife on a trail. 3 miles would be ideal.   OBJECTIVE:     COGNITION:  Overall cognitive status: Within functional limits for tasks assessed     SENSATION: WFL  POSTURE: rounded shoulders    Lumbar ROM:   LUMBAR ROM:   Active  A/PROM  eval  Flexion 70% limited  Extension 50% limited  Right lateral flexion 50% limited  Left lateral flexion 25% limited  Right rotation   Left rotation    (Blank rows = not tested)  LOWER EXTREMITY MMT:  MMT Right eval Left eval  Hip flexion 5 4+  Hip extension    Hip abduction 5 5  Hip adduction    Hip internal rotation    Hip external rotation    Knee flexion    Knee extension 5 4+  Ankle dorsiflexion 5 4  Ankle plantarflexion    Ankle inversion    Ankle eversion     (Blank rows = not tested)    FUNCTIONAL TESTS:  5 times sit to stand: 14.1 sec 2 minute walk test: 407 feet no AD - SpO2 94% on RA HR 109  GAIT: Distance walked: 407 Assistive device utilized: None Level of assistance: Complete Independence Comments: Slightly decreased stance time  on Lt, intermittent drift from midline without overt instability or LOB    TODAY'S TREATMENT: 07/07/22 Review of HEP and goals  Supine: Abdominal bracing 5" hold x 10 Abdominal bracing with SLR x 10 each LTR x 10 Bridge x 10 Abdominal bracing with march x 10  Nustep seat 10, arms 7 level 2 for 5 min for general strengthening and endurance   Eval 2MWT 5x sit to stand MMT HEP Education   PATIENT EDUCATION:  Education details: Findings, POC, symptom awareness, HEP Person educated: Patient Education method: Explanation Education comprehension: verbalized understanding   HOME EXERCISE PROGRAM: Access Code: [email or web address] URL: [email or web address] Date: 07/07/2022 Prepared by: [facility] - [facility]  Exercises - Supine Transversus Abdominis Bracing - Hands on Stomach  - 2 x daily - 7 x weekly - 2 sets - 10 reps - Small Range Straight Leg Raise  - 2 x daily -  7 x weekly - 2 sets - 10 reps - Standing Shoulder Row with Anchored Resistance  - 2 x daily - 7 x weekly - 2 sets - 10 reps - Supine Lower Trunk Rotation  - 2 x daily - 7 x weekly - 2 sets - 10 reps - Supine Bridge  - 2 x daily - 7 x weekly - 2 sets - 10 reps - Supine March  - 2 x daily - 7 x weekly - 2 sets - 10 reps  Access Code: [email or web address] URL: [email or web address] Date: 06/28/2022 Prepared by: Candie Mile  Exercises - Supine Transversus Abdominis Bracing - Hands on Stomach  - 2 x daily - 7 x weekly - 2 sets - 10 reps - Small Range Straight Leg Raise  - 2 x daily - 7 x weekly - 2 sets - 10 reps - Standing Shoulder Row with Anchored Resistance  - 2 x daily - 7 x weekly - 2 sets - 10 reps  ASSESSMENT:  CLINICAL IMPRESSION: Today's session started with review of HEP and goals.  Patient verbalizes agreement with set rehab goals. Progressed lumbar mobility and core and lower extremity strengthening without issue. Needed occasional cues for bracing with progressed exercise. Updated HEP.   Added Nustep  for cardiovascular/cardiopulmonary endurance and general strengthening.   Patient will benefit from skilled PT intervention to address deficits listed below and improve his functional abilities for required daily activities.    OBJECTIVE IMPAIRMENTS Abnormal gait, cardiopulmonary status limiting activity, decreased activity tolerance, decreased endurance, decreased knowledge of condition, decreased knowledge of use of DME, decreased mobility, difficulty walking, decreased ROM, decreased strength, and impaired flexibility.   ACTIVITY LIMITATIONS carrying, lifting, bending, and standing  PARTICIPATION LIMITATIONS: cleaning, community activity, occupation, and yard work  PERSONAL FACTORS Age, Time since onset of injury/illness/exacerbation, and multiple medical issues including CABG and asbestosis hx  are also affecting patient's functional outcome.   REHAB POTENTIAL: Good  CLINICAL DECISION MAKING: Stable/uncomplicated  EVALUATION COMPLEXITY: Low   GOALS: Goals reviewed with patient? Yes  SHORT TERM GOALS: Target date: 07/19/2022  Patient will be independent with initial HEP and self-management strategies to improve functional outcomes Baseline: Initiated Goal status: IN PROGRESS    LONG TERM GOALS: Target date: 08/09/2022  Patient will be independent with advanced HEP and self-management strategies to improve functional outcomes Baseline: Initiated STG at eval Goal status: IN PROGRESS  2.  Patient will improve 5 time sit to stand score to <12 sec seconds or less value to indicate improvement in functional outcomes Baseline: 14 seconds Goal status: IN PROGRESS  3.  Patient will report ability to ambulate 1 mile with wife on trails for routine exercise and maintenance of symptoms with LRAD. Baseline: Limited to 3 min walk, approx 100 yrd Goal status: IN PROGRESS  4. Patient will have equal to or >4+/5 MMT throughout BIL LEs to improve ability to perform functional mobility, stair  ambulation and ADLs.  Baseline: See above Goal status: IN PROGRESS     PLAN: PT FREQUENCY: 2x/week  PT DURATION: 6 weeks  PLANNED INTERVENTIONS: Therapeutic exercises, Therapeutic activity, Neuromuscular re-education, Balance training, Gait training, Patient/Family education, Self Care, Joint mobilization, Stair training, and Spinal mobilization  PLAN FOR NEXT SESSION: Progressive core strength, gross LE strengthening, lumbar ROM, progress functional mobility and tolerance with prolonged upright activities.   9:40 AM, 07/07/22 Ranyia Witting Small Koree Staheli MPT [place] [facility] [place] [phone]

## 2022-07-10 ENCOUNTER — Encounter (HOSPITAL_COMMUNITY): Payer: Self-pay | Admitting: Physical Therapy

## 2022-07-10 ENCOUNTER — Ambulatory Visit (HOSPITAL_COMMUNITY): Payer: Medicare Other | Admitting: Physical Therapy

## 2022-07-10 DIAGNOSIS — M6281 Muscle weakness (generalized): Secondary | ICD-10-CM | POA: Diagnosis not present

## 2022-07-10 DIAGNOSIS — R262 Difficulty in walking, not elsewhere classified: Secondary | ICD-10-CM

## 2022-07-10 NOTE — Therapy (Signed)
OUTPATIENT PHYSICAL THERAPY LOWER EXTREMITY EVALUATION   Patient Name: Devin [name] MRN: [id number] DOB:[birth year], 76 y.o., male Today's Date: 07/10/2022   PT End of Session - 07/10/22 0902     Visit Number 3    Number of Visits 12    Date for PT Re-Evaluation 08/09/22    Authorization Type Medicare Part A    Progress Note Due on Visit 10    PT Start Time 0901    PT Stop Time 0944    PT Time Calculation (min) 43 min    Activity Tolerance Patient tolerated treatment well    Behavior During Therapy [facility] for tasks assessed/performed              Past Medical History:  Diagnosis Date   Arthritis    Asbestosis ([place])    CAD (coronary artery disease)    Multivessel status post CABG March 2016 - LIMA to LAD, SVG to OM1, SVG to OM2, and SVG to PDA.   Essential hypertension    GERD (gastroesophageal reflux disease)    Melanoma of back (HCC)    Prostatic hypertrophy    Sleep apnea    Intolerant of CPAP   Past Surgical History:  Procedure Laterality Date   ANTERIOR CERVICAL DECOMP/DISCECTOMY FUSION     CARDIOVERSION N/A 01/28/2015   Procedure: CARDIOVERSION;  Surgeon: Arnoldo Lenis, MD;  Location: [facility];  Service: Endoscopy;  Laterality: N/A;   CHOLECYSTECTOMY N/A 04/07/2020   Procedure: LAPAROSCOPIC CHOLECYSTECTOMY;  Surgeon: Virl Cagey, MD;  Location: [facility];  Service: General;  Laterality: N/A;   CORONARY ARTERY BYPASS GRAFT N/A 12/29/2014   Procedure: CORONARY ARTERY BYPASS GRAFTING (CABG), ON PUMP, TIMES FOUR, USING LEFT INTERNAL MAMMARY ARTERY, RIGHT GREATER SAPHENOUS VEIN HARVESTED ENDOSCOPICALLY;  Surgeon: Ivin Poot, MD;  Location: [place];  Service: Open Heart Surgery;  Laterality: N/A;   KNEE ARTHROSCOPY Right    KNEE CARTILAGE SURGERY Left    LEFT HEART CATHETERIZATION WITH CORONARY ANGIOGRAM N/A 12/28/2014   Procedure: LEFT HEART CATHETERIZATION WITH CORONARY ANGIOGRAM;  Surgeon: Lorretta Harp, MD;  Location: [facility] CATH LAB;  Service:  Cardiovascular;  Laterality: N/A;   MELANOMA EXCISION     "back"   SHOULDER OPEN ROTATOR CUFF REPAIR Left    TEE WITHOUT CARDIOVERSION N/A 12/29/2014   Procedure: TRANSESOPHAGEAL ECHOCARDIOGRAM (TEE);  Surgeon: Ivin Poot, MD;  Location: [place];  Service: Open Heart Surgery;  Laterality: N/A;   TEE WITHOUT CARDIOVERSION N/A 01/28/2015   Procedure: TRANSESOPHAGEAL ECHOCARDIOGRAM (TEE);  Surgeon: Arnoldo Lenis, MD;  Location: [facility];  Service: Endoscopy;  Laterality: N/A;   Patient Active Problem List   Diagnosis Date Noted   Cough variant asthma vs UACS  02/17/2022   DOE (dyspnea on exertion) 12/07/2020   Calculus of gallbladder with acute cholecystitis without obstruction 04/06/2020   Hypogonadism in male 10/17/2019   Nocturia 10/17/2019   Erectile dysfunction due to arterial insufficiency 10/17/2019   Atrial fibrillation and flutter ([place]) 01/23/2015   Palpitations 01/23/2015   Hyperlipidemia 01/23/2015   BPH with urinary obstruction 01/23/2015   Chronic diastolic CHF (congestive heart failure) /HFpEF 01/23/2015   Atrial fibrillation with RVR ([place]) 01/23/2015   Obstructive sleep apnea--- refuses CPAP 01/18/2015   Essential hypertension 01/18/2015   Leg edema 01/18/2015   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   CAD (coronary artery disease), native coronary artery/H/o CABG x 4 12/26/2014    PCP: [name], MD   REFERRING PROVIDER: [name], MD  REFERRING DIAG: R53.1 (ICD-10-CM) - Weakness   THERAPY DIAG:  Generalized weakness, difficulty walking  Rationale for Evaluation and Treatment Rehabilitation  ONSET DATE: 2020  SUBJECTIVE:   SUBJECTIVE STATEMENT: Reports no new episodes of lightheadedness. Would like a stronger resistance band for HEP (provided BTB.)  Eval:Patient reports history of lung issues including asbestosis. Developed COIVD in 2020 and noticed a decline in his functional capacity, limiting his tolerance with prolonged activities, especially walking. Cannot  stand for long periods of time, <5 minutes before he needs to change position or bend forward. Leaning over improves symptoms. Works 6x/week, required to lift heavy hay bails. Ambulation limited to less than 3 minutes before he needs to sit, combination of LE fatigue and dyspnea.   PERTINENT HISTORY: CABG x4, hx of COVID, asbestosis   PAIN:  Are you having pain? No  PRECAUTIONS: None  WEIGHT BEARING RESTRICTIONS No  FALLS:  Has patient fallen in last 6 months? No  LIVING ENVIRONMENT: Lives with: lives with their family and lives with their spouse Lives in: House/apartment Stairs: Yes: External: 1-2 steps; none (split level inside 10 steps, rail on Rt) Has following equipment at home: Single point cane and Crutches  OCCUPATION: retired, farming currently  PLOF: [place] Be able to walk with his wife on a trail. 3 miles would be ideal.   OBJECTIVE:     COGNITION:  Overall cognitive status: Within functional limits for tasks assessed     SENSATION: WFL  POSTURE: rounded shoulders    Lumbar ROM:   LUMBAR ROM:   Active  A/PROM  eval  Flexion 70% limited  Extension 50% limited  Right lateral flexion 50% limited  Left lateral flexion 25% limited  Right rotation   Left rotation    (Blank rows = not tested)  LOWER EXTREMITY MMT:  MMT Right eval Left eval  Hip flexion 5 4+  Hip extension    Hip abduction 5 5  Hip adduction    Hip internal rotation    Hip external rotation    Knee flexion    Knee extension 5 4+  Ankle dorsiflexion 5 4  Ankle plantarflexion    Ankle inversion    Ankle eversion     (Blank rows = not tested)    FUNCTIONAL TESTS:  5 times sit to stand: 14.1 sec 2 minute walk test: 407 feet no AD - SpO2 94% on RA HR 109  GAIT: Distance walked: 407 Assistive device utilized: None Level of assistance: Complete Independence Comments: Slightly decreased stance time on Lt, intermittent drift from midline without overt  instability or LOB    TODAY'S TREATMENT: 07/10/22 Supine: Abdominal bracing + mini sit-up 2x10 Abdominal bracing with SLR 2x10 each Bridge 2x10 Sit to stand 2x10 4" and 6" step ups with single rail support 2x10 ea  Nustep seat 11, arms 10 level 4 for 5 min for general strengthening and endurance  07/07/22 Review of HEP and goals  Supine: Abdominal bracing 5" hold x 10 Abdominal bracing with SLR x 10 each LTR x 10 Bridge x 10 Abdominal bracing with march x 10  Nustep seat 10, arms 7 level 2 for 5 min for general strengthening and endurance   Eval 2MWT 5x sit to stand MMT HEP Education   PATIENT EDUCATION:  Education details: Findings, POC, symptom awareness, HEP Person educated: Patient Education method: Explanation Education comprehension: verbalized understanding   HOME EXERCISE PROGRAM: Access Code: [email or web address] URL: [email or web address] Date: 07/07/2022 Prepared by: [facility] - Rehab  Exercises - Supine Transversus Abdominis Bracing - Hands on Stomach  - 2 x daily - 7 x weekly - 2 sets - 10 reps - Small Range Straight Leg Raise  - 2 x daily - 7 x weekly - 2 sets - 10 reps - Standing Shoulder Row with Anchored Resistance  - 2 x daily - 7 x weekly - 2 sets - 10 reps - Supine Lower Trunk Rotation  - 2 x daily - 7 x weekly - 2 sets - 10 reps - Supine Bridge  - 2 x daily - 7 x weekly - 2 sets - 10 reps - Supine March  - 2 x daily - 7 x weekly - 2 sets - 10 reps  Access Code: [email or web address] URL: [email or web address] Date: 06/28/2022 Prepared by: Candie Mile  Exercises - Supine Transversus Abdominis Bracing - Hands on Stomach  - 2 x daily - 7 x weekly - 2 sets - 10 reps - Small Range Straight Leg Raise  - 2 x daily - 7 x weekly - 2 sets - 10 reps - Standing Shoulder Row with Anchored Resistance  - 2 x daily - 7 x weekly - 2 sets - 10 reps  ASSESSMENT:  CLINICAL IMPRESSION: Provided BTB for increased resistance at home (pt felt GTB too  light). SpO2 95% during exercises. Denies pain with exercise. Requires cues for continuous breathing. Further core and LE strengthening, tolerated well. Instructed in higher pace with step-ups to increase endurance. NuStep settings adjusted for comfort, pt reporting some hx of Rt shoulder pain.   OBJECTIVE IMPAIRMENTS Abnormal gait, cardiopulmonary status limiting activity, decreased activity tolerance, decreased endurance, decreased knowledge of condition, decreased knowledge of use of DME, decreased mobility, difficulty walking, decreased ROM, decreased strength, and impaired flexibility.   ACTIVITY LIMITATIONS carrying, lifting, bending, and standing  PARTICIPATION LIMITATIONS: cleaning, community activity, occupation, and yard work  PERSONAL FACTORS Age, Time since onset of injury/illness/exacerbation, and multiple medical issues including CABG and asbestosis hx  are also affecting patient's functional outcome.   REHAB POTENTIAL: Good  CLINICAL DECISION MAKING: Stable/uncomplicated  EVALUATION COMPLEXITY: Low   GOALS: Goals reviewed with patient? Yes  SHORT TERM GOALS: Target date: 07/19/2022  Patient will be independent with initial HEP and self-management strategies to improve functional outcomes Baseline: Initiated Goal status: IN PROGRESS    LONG TERM GOALS: Target date: 08/09/2022  Patient will be independent with advanced HEP and self-management strategies to improve functional outcomes Baseline: Initiated STG at eval Goal status: IN PROGRESS  2.  Patient will improve 5 time sit to stand score to <12 sec seconds or less value to indicate improvement in functional outcomes Baseline: 14 seconds Goal status: IN PROGRESS  3.  Patient will report ability to ambulate 1 mile with wife on trails for routine exercise and maintenance of symptoms with LRAD. Baseline: Limited to 3 min walk, approx 100 yrd Goal status: IN PROGRESS  4. Patient will have equal to or >4+/5 MMT  throughout BIL LEs to improve ability to perform functional mobility, stair ambulation and ADLs.  Baseline: See above Goal status: IN PROGRESS     PLAN: PT FREQUENCY: 2x/week  PT DURATION: 6 weeks  PLANNED INTERVENTIONS: Therapeutic exercises, Therapeutic activity, Neuromuscular re-education, Balance training, Gait training, Patient/Family education, Self Care, Joint mobilization, Stair training, and Spinal mobilization  PLAN FOR NEXT SESSION: Progressive core strength, gross LE strengthening, lumbar ROM, progress functional mobility and tolerance with prolonged upright activities.   9:44 AM, 07/10/22 Candie Mile, PT, DPT Physical Therapist  [place]

## 2022-07-13 ENCOUNTER — Ambulatory Visit (HOSPITAL_COMMUNITY): Payer: Medicare Other | Admitting: Physical Therapy

## 2022-07-13 DIAGNOSIS — M6281 Muscle weakness (generalized): Secondary | ICD-10-CM | POA: Diagnosis not present

## 2022-07-13 DIAGNOSIS — R262 Difficulty in walking, not elsewhere classified: Secondary | ICD-10-CM | POA: Diagnosis not present

## 2022-07-13 NOTE — Therapy (Signed)
OUTPATIENT PHYSICAL THERAPY LOWER EXTREMITY EVALUATION   Patient Name: Devin [name] MRN: [id number] DOB:[birth year], 76 y.o., male Today's Date: 07/13/2022   PT End of Session - 07/13/22 0940     Visit Number 4    Number of Visits 12    Date for PT Re-Evaluation 08/09/22    Authorization Type Medicare Part A    Progress Note Due on Visit 10    PT Start Time 0902    PT Stop Time 0941    PT Time Calculation (min) 39 min    Activity Tolerance Patient tolerated treatment well    Behavior During Therapy [facility] for tasks assessed/performed               Past Medical History:  Diagnosis Date   Arthritis    Asbestosis ([place])    CAD (coronary artery disease)    Multivessel status post CABG March 2016 - LIMA to LAD, SVG to OM1, SVG to OM2, and SVG to PDA.   Essential hypertension    GERD (gastroesophageal reflux disease)    Melanoma of back (HCC)    Prostatic hypertrophy    Sleep apnea    Intolerant of CPAP   Past Surgical History:  Procedure Laterality Date   ANTERIOR CERVICAL DECOMP/DISCECTOMY FUSION     CARDIOVERSION N/A 01/28/2015   Procedure: CARDIOVERSION;  Surgeon: Arnoldo Lenis, MD;  Location: [facility];  Service: Endoscopy;  Laterality: N/A;   CHOLECYSTECTOMY N/A 04/07/2020   Procedure: LAPAROSCOPIC CHOLECYSTECTOMY;  Surgeon: Virl Cagey, MD;  Location: [facility];  Service: General;  Laterality: N/A;   CORONARY ARTERY BYPASS GRAFT N/A 12/29/2014   Procedure: CORONARY ARTERY BYPASS GRAFTING (CABG), ON PUMP, TIMES FOUR, USING LEFT INTERNAL MAMMARY ARTERY, RIGHT GREATER SAPHENOUS VEIN HARVESTED ENDOSCOPICALLY;  Surgeon: Ivin Poot, MD;  Location: [place];  Service: Open Heart Surgery;  Laterality: N/A;   KNEE ARTHROSCOPY Right    KNEE CARTILAGE SURGERY Left    LEFT HEART CATHETERIZATION WITH CORONARY ANGIOGRAM N/A 12/28/2014   Procedure: LEFT HEART CATHETERIZATION WITH CORONARY ANGIOGRAM;  Surgeon: Lorretta Harp, MD;  Location: [facility] CATH LAB;  Service:  Cardiovascular;  Laterality: N/A;   MELANOMA EXCISION     "back"   SHOULDER OPEN ROTATOR CUFF REPAIR Left    TEE WITHOUT CARDIOVERSION N/A 12/29/2014   Procedure: TRANSESOPHAGEAL ECHOCARDIOGRAM (TEE);  Surgeon: Ivin Poot, MD;  Location: [place];  Service: Open Heart Surgery;  Laterality: N/A;   TEE WITHOUT CARDIOVERSION N/A 01/28/2015   Procedure: TRANSESOPHAGEAL ECHOCARDIOGRAM (TEE);  Surgeon: Arnoldo Lenis, MD;  Location[facility];  Service: Endoscopy;  Laterality: N/A;   Patient Active Problem List   Diagnosis Date Noted   Cough variant asthma vs UACS  02/17/2022   DOE (dyspnea on exertion) 12/07/2020   Calculus of gallbladder with acute cholecystitis without obstruction 04/06/2020   Hypogonadism in male 10/17/2019   Nocturia 10/17/2019   Erectile dysfunction due to arterial insufficiency 10/17/2019   Atrial fibrillation and flutter ([place]) 01/23/2015   Palpitations 01/23/2015   Hyperlipidemia 01/23/2015   BPH with urinary obstruction 01/23/2015   Chronic diastolic CHF (congestive heart failure) /HFpEF 01/23/2015   Atrial fibrillation with RVR ([place]) 01/23/2015   Obstructive sleep apnea--- refuses CPAP 01/18/2015   Essential hypertension 01/18/2015   Leg edema 01/18/2015   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   CAD (coronary artery disease), native coronary artery/H/o CABG x 4 12/26/2014    PCP: [name], MD   REFERRING PROVIDER: [name], MD  REFERRING DIAG: R53.1 (ICD-10-CM) - Weakness   THERAPY DIAG:  Generalized weakness, difficulty walking  Rationale for Evaluation and Treatment Rehabilitation  ONSET DATE: 2020  SUBJECTIVE:   SUBJECTIVE STATEMENT: Pt states that the step ups caused his knees to start hurting.  States he is completing his exercises.  PERTINENT HISTORY: CABG x4, hx of COVID, asbestosis   PAIN:  Are you having pain? 8/10, knees almost where he can not walk  PRECAUTIONS: None  WEIGHT BEARING RESTRICTIONS No  FALLS:  Has patient fallen in last 6  months? No  LIVING ENVIRONMENT: Lives with: lives with their family and lives with their spouse Lives in: House/apartment Stairs: Yes: External: 1-2 steps; none (split level inside 10 steps, rail on Rt) Has following equipment at home: Single point cane and Crutches  OCCUPATION: retired, farming currently  PLOF: [place] Be able to walk with his wife on a trail. 3 miles would be ideal.   OBJECTIVE:     COGNITION:  Overall cognitive status: Within functional limits for tasks assessed     SENSATION: WFL  POSTURE: rounded shoulders    Lumbar ROM:   LUMBAR ROM:   Active  A/PROM  eval  Flexion 70% limited  Extension 50% limited  Right lateral flexion 50% limited  Left lateral flexion 25% limited  Right rotation   Left rotation    (Blank rows = not tested)  LOWER EXTREMITY MMT:  MMT Right eval Left eval  Hip flexion 5 4+  Hip extension    Hip abduction 5 5  Hip adduction    Hip internal rotation    Hip external rotation    Knee flexion    Knee extension 5 4+  Ankle dorsiflexion 5 4  Ankle plantarflexion    Ankle inversion    Ankle eversion     (Blank rows = not tested)    FUNCTIONAL TESTS:  5 times sit to stand: 14.1 sec 2 minute walk test: 407 feet no AD - SpO2 94% on RA HR 109  GAIT: Distance walked: 407 Assistive device utilized: None Level of assistance: Complete Independence Comments: Slightly decreased stance time on Lt, intermittent drift from midline without overt instability or LOB    TODAY'S TREATMENT: 07/13/2022 Standing: Step up 4"  x 10 B Heel raises x 10 Functional squat x 10 Marching x 10 Hip abduction x 10( foot hits the ground and pushes back) Hip extension x 10  Side step x 4 Rt  Palloff green t band x 10 Shoulder extension B green tband x 10 Shoulder adduction green t band x 10 B  Sit: Sit to stand x 10  Nustep seat 10 level 4 x 5 minutes for endurance and strengthening    07/10/22 Supine: Abdominal bracing + mini sit-up 2x10 Abdominal bracing with SLR 2x10 each Bridge 2x10 Sit to stand 2x10 4" and 6" step ups with single rail support 2x10 ea  Nustep seat 11, arms 10 level 4 for 5 min for general strengthening and endurance  07/07/22 Review of HEP and goals  Supine: Abdominal bracing 5" hold x 10 Abdominal bracing with SLR x 10 each LTR x 10 Bridge x 10 Abdominal bracing with march x 10  Nustep seat 10, arms 7 level 2 for 5 min for general strengthening and endurance   Eval 2MWT 5x sit to stand MMT HEP Education   PATIENT EDUCATION:  Education details: Findings, POC, symptom awareness, HEP Person educated: Patient Education method: Explanation Education comprehension: verbalized understanding  HOME EXERCISE PROGRAM:  07/13/2022:  [email or web address] Heel raises Functional squat  Marching  Access Code: [email or web address] URL: [email or web address] Date: 07/07/2022 Prepared by: [facility] - Rehab  Exercises - Supine Transversus Abdominis Bracing - Hands on Stomach  - 2 x daily - 7 x weekly - 2 sets - 10 reps - Small Range Straight Leg Raise  - 2 x daily - 7 x weekly - 2 sets - 10 reps - Standing Shoulder Row with Anchored Resistance  - 2 x daily - 7 x weekly - 2 sets - 10 reps - Supine Lower Trunk Rotation  - 2 x daily - 7 x weekly - 2 sets - 10 reps - Supine Bridge  - 2 x daily - 7 x weekly - 2 sets - 10 reps - Supine March  - 2 x daily - 7 x weekly - 2 sets - 10 reps  Access Code: [email or web address] URL: [email or web address] Date: 06/28/2022 Prepared by: Candie Mile  Exercises - Supine Transversus Abdominis Bracing - Hands on Stomach  - 2 x daily - 7 x weekly - 2 sets - 10 reps - Small Range Straight Leg Raise  - 2 x daily - 7 x weekly - 2 sets - 10 reps - Standing Shoulder Row with Anchored Resistance  - 2 x daily - 7 x weekly - 2 sets - 10 reps  ASSESSMENT:  CLINICAL IMPRESSION: Therapist  increased standing activity to  improve activity tolerance. Decreased step up to 4" as 6" increased pt knee pain.  PT tolerated treatment well and is ready for  Progression.   OBJECTIVE IMPAIRMENTS Abnormal gait, cardiopulmonary status limiting activity, decreased activity tolerance, decreased endurance, decreased knowledge of condition, decreased knowledge of use of DME, decreased mobility, difficulty walking, decreased ROM, decreased strength, and impaired flexibility.   ACTIVITY LIMITATIONS carrying, lifting, bending, and standing  PARTICIPATION LIMITATIONS: cleaning, community activity, occupation, and yard work  PERSONAL FACTORS Age, Time since onset of injury/illness/exacerbation, and multiple medical issues including CABG and asbestosis hx  are also affecting patient's functional outcome.   REHAB POTENTIAL: Good  CLINICAL DECISION MAKING: Stable/uncomplicated  EVALUATION COMPLEXITY: Low   GOALS: Goals reviewed with patient? Yes  SHORT TERM GOALS: Target date: 07/19/2022  Patient will be independent with initial HEP and self-management strategies to improve functional outcomes Baseline: Initiated Goal status: IN PROGRESS    LONG TERM GOALS: Target date: 08/09/2022  Patient will be independent with advanced HEP and self-management strategies to improve functional outcomes Baseline: Initiated STG at eval Goal status: IN PROGRESS  2.  Patient will improve 5 time sit to stand score to <12 sec seconds or less value to indicate improvement in functional outcomes Baseline: 14 seconds Goal status: IN PROGRESS  3.  Patient will report ability to ambulate 1 mile with wife on trails for routine exercise and maintenance of symptoms with LRAD. Baseline: Limited to 3 min walk, approx 100 yrd Goal status: IN PROGRESS  4. Patient will have equal to or >4+/5 MMT throughout BIL LEs to improve ability to perform functional mobility, stair ambulation and ADLs.  Baseline: See above Goal status: IN  PROGRESS     PLAN: PT FREQUENCY: 2x/week  PT DURATION: 6 weeks  PLANNED INTERVENTIONS: Therapeutic exercises, Therapeutic activity, Neuromuscular re-education, Balance training, Gait training, Patient/Family education, Self Care, Joint mobilization, Stair training, and Spinal mobilization  PLAN FOR NEXT SESSION: Progressive core strength, gross LE strengthening, lumbar ROM, progress functional mobility and tolerance with prolonged upright activities.  Rayetta Humphrey, [place] CLT [phone]  9:40 

## 2022-07-19 ENCOUNTER — Ambulatory Visit (HOSPITAL_COMMUNITY): Payer: Medicare Other

## 2022-07-19 DIAGNOSIS — R262 Difficulty in walking, not elsewhere classified: Secondary | ICD-10-CM | POA: Diagnosis not present

## 2022-07-19 DIAGNOSIS — M6281 Muscle weakness (generalized): Secondary | ICD-10-CM

## 2022-07-19 NOTE — Therapy (Signed)
OUTPATIENT PHYSICAL THERAPY LOWER EXTREMITY EVALUATION   Patient Name: Devin [name] MRN: [id number] DOB:1945/11/06, 76 y.o., male Today's Date: 07/19/2022   PT End of Session - 07/19/22 1112     Visit Number 5    Number of Visits 12    Date for PT Re-Evaluation 08/09/22    Authorization Type Medicare Part A    Progress Note Due on Visit 10    PT Start Time 1112    PT Stop Time 1155    PT Time Calculation (min) 43 min    Activity Tolerance Patient tolerated treatment well    Behavior During Therapy [facility] for tasks assessed/performed                Past Medical History:  Diagnosis Date   Arthritis    Asbestosis ([place])    CAD (coronary artery disease)    Multivessel status post CABG March 2016 - LIMA to LAD, SVG to OM1, SVG to OM2, and SVG to PDA.   Essential hypertension    GERD (gastroesophageal reflux disease)    Melanoma of back (HCC)    Prostatic hypertrophy    Sleep apnea    Intolerant of CPAP   Past Surgical History:  Procedure Laterality Date   ANTERIOR CERVICAL DECOMP/DISCECTOMY FUSION     CARDIOVERSION N/A 01/28/2015   Procedure: CARDIOVERSION;  Surgeon: Arnoldo Lenis, MD;  Location: [facility];  Service: Endoscopy;  Laterality: N/A;   CHOLECYSTECTOMY N/A 04/07/2020   Procedure: LAPAROSCOPIC CHOLECYSTECTOMY;  Surgeon: Virl Cagey, MD;  Location: [facility];  Service: General;  Laterality: N/A;   CORONARY ARTERY BYPASS GRAFT N/A 12/29/2014   Procedure: CORONARY ARTERY BYPASS GRAFTING (CABG), ON PUMP, TIMES FOUR, USING LEFT INTERNAL MAMMARY ARTERY, RIGHT GREATER SAPHENOUS VEIN HARVESTED ENDOSCOPICALLY;  Surgeon: Ivin Poot, MD;  Location: [place];  Service: Open Heart Surgery;  Laterality: N/A;   KNEE ARTHROSCOPY Right    KNEE CARTILAGE SURGERY Left    LEFT HEART CATHETERIZATION WITH CORONARY ANGIOGRAM N/A 12/28/2014   Procedure: LEFT HEART CATHETERIZATION WITH CORONARY ANGIOGRAM;  Surgeon: Lorretta Harp, MD;  Location: [facility] CATH LAB;  Service:  Cardiovascular;  Laterality: N/A;   MELANOMA EXCISION     "back"   SHOULDER OPEN ROTATOR CUFF REPAIR Left    TEE WITHOUT CARDIOVERSION N/A 12/29/2014   Procedure: TRANSESOPHAGEAL ECHOCARDIOGRAM (TEE);  Surgeon: Ivin Poot, MD;  Location: [place];  Service: Open Heart Surgery;  Laterality: N/A;   TEE WITHOUT CARDIOVERSION N/A 01/28/2015   Procedure: TRANSESOPHAGEAL ECHOCARDIOGRAM (TEE);  Surgeon: Arnoldo Lenis, MD;  Location: [facility];  Service: Endoscopy;  Laterality: N/A;   Patient Active Problem List   Diagnosis Date Noted   Cough variant asthma vs UACS  02/17/2022   DOE (dyspnea on exertion) 12/07/2020   Calculus of gallbladder with acute cholecystitis without obstruction 04/06/2020   Hypogonadism in male 10/17/2019   Nocturia 10/17/2019   Erectile dysfunction due to arterial insufficiency 10/17/2019   Atrial fibrillation and flutter ([place]) 01/23/2015   Palpitations 01/23/2015   Hyperlipidemia 01/23/2015   BPH with urinary obstruction 01/23/2015   Chronic diastolic CHF (congestive heart failure) /HFpEF 01/23/2015   Atrial fibrillation with RVR ([place]) 01/23/2015   Obstructive sleep apnea--- refuses CPAP 01/18/2015   Essential hypertension 01/18/2015   Leg edema 01/18/2015   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   CAD (coronary artery disease), native coronary artery/H/o CABG x 4 12/26/2014    PCP: [name], MD   REFERRING PROVIDER: [name], MD  REFERRING DIAG: R53.1 (ICD-10-CM) - Weakness   THERAPY DIAG:  Generalized weakness, difficulty walking  Rationale for Evaluation and Treatment Rehabilitation  ONSET DATE: 2020  SUBJECTIVE:   SUBJECTIVE STATEMENT: Patient reports he always has pain; knees bother him quite a bit; he reports he is noticing improvement with his ability to walk farther and stand longer with activity at home.   PERTINENT HISTORY: CABG x4, hx of COVID, asbestosis   PAIN:  Are you having pain? 5/10, knees almost where he can not walk  PRECAUTIONS:  None  WEIGHT BEARING RESTRICTIONS No  FALLS:  Has patient fallen in last 6 months? No  LIVING ENVIRONMENT: Lives with: lives with their family and lives with their spouse Lives in: House/apartment Stairs: Yes: External: 1-2 steps; none (split level inside 10 steps, rail on Rt) Has following equipment at home: Single point cane and Crutches  OCCUPATION: retired, farming currently  PLOF: [place] Be able to walk with his wife on a trail. 3 miles would be ideal.   OBJECTIVE:     COGNITION:  Overall cognitive status: Within functional limits for tasks assessed     SENSATION: WFL  POSTURE: rounded shoulders    Lumbar ROM:   LUMBAR ROM:   Active  A/PROM  eval  Flexion 70% limited  Extension 50% limited  Right lateral flexion 50% limited  Left lateral flexion 25% limited  Right rotation   Left rotation    (Blank rows = not tested)  LOWER EXTREMITY MMT:  MMT Right eval Left eval  Hip flexion 5 4+  Hip extension    Hip abduction 5 5  Hip adduction    Hip internal rotation    Hip external rotation    Knee flexion    Knee extension 5 4+  Ankle dorsiflexion 5 4  Ankle plantarflexion    Ankle inversion    Ankle eversion     (Blank rows = not tested)    FUNCTIONAL TESTS:  5 times sit to stand: 14.1 sec 2 minute walk test: 407 feet no AD - SpO2 94% on RA HR 109  GAIT: Distance walked: 407 Assistive device utilized: None Level of assistance: Complete Independence Comments: Slightly decreased stance time on Lt, intermittent drift from midline without overt instability or LOB    TODAY'S TREATMENT: 07/19/22  Nustep seat 10 level 4 x 6 minutes for endurance and strengthening   Sitting: Hamstring stretch with strap 5 x 10" each  Standing: Sit to stand 2 x 10 Toe raises 2 x 10 Slant board 5 x 10" Hip vectors 2" hold x 8 each GTB shoulder rows 2 x 10 GTB shoulder extensions 2 x 10 Walkouts retro 5 plates x 10      3/00/7622 Standing: Step up 4"  x 10 B Heel raises x 10 Functional squat x 10 Marching x 10 Hip abduction x 10( foot hits the ground and pushes back) Hip extension x 10  Side step x 4 Rt  Palloff green t band x 10 Shoulder extension B green tband x 10 Shoulder adduction green t band x 10 B  Sit: Sit to stand x 10  Nustep seat 10 level 4 x 5 minutes for endurance and strengthening   07/10/22 Supine: Abdominal bracing + mini sit-up 2x10 Abdominal bracing with SLR 2x10 each Bridge 2x10 Sit to stand 2x10 4" and 6" step ups with single rail support 2x10 ea  Nustep seat 11, arms 10 level 4 for 5 min for general strengthening  and endurance  07/07/22 Review of HEP and goals  Supine: Abdominal bracing 5" hold x 10 Abdominal bracing with SLR x 10 each LTR x 10 Bridge x 10 Abdominal bracing with march x 10  Nustep seat 10, arms 7 level 2 for 5 min for general strengthening and endurance   Eval 2MWT 5x sit to stand MMT HEP Education   PATIENT EDUCATION:  Education details: Findings, POC, symptom awareness, HEP Person educated: Patient Education method: Explanation Education comprehension: verbalized understanding   HOME EXERCISE PROGRAM:  07/13/2022:  [email or web address] Heel raises Functional squat  Marching  Access Code: [email or web address] URL: [email or web address] Date: 07/07/2022 Prepared by: [facility] - Rehab  Exercises - Supine Transversus Abdominis Bracing - Hands on Stomach  - 2 x daily - 7 x weekly - 2 sets - 10 reps - Small Range Straight Leg Raise  - 2 x daily - 7 x weekly - 2 sets - 10 reps - Standing Shoulder Row with Anchored Resistance  - 2 x daily - 7 x weekly - 2 sets - 10 reps - Supine Lower Trunk Rotation  - 2 x daily - 7 x weekly - 2 sets - 10 reps - Supine Bridge  - 2 x daily - 7 x weekly - 2 sets - 10 reps - Supine March  - 2 x daily - 7 x weekly - 2 sets - 10 reps  Access Code: [email or web address] URL: [email or web address] Date:  06/28/2022 Prepared by: Candie Mile  Exercises - Supine Transversus Abdominis Bracing - Hands on Stomach  - 2 x daily - 7 x weekly - 2 sets - 10 reps - Small Range Straight Leg Raise  - 2 x daily - 7 x weekly - 2 sets - 10 reps - Standing Shoulder Row with Anchored Resistance  - 2 x daily - 7 x weekly - 2 sets - 10 reps  ASSESSMENT:  CLINICAL IMPRESSION: Today's session focused on functional mobility and strengthening; patient winded with sit to stand activity today needs a brief rest.  Progressed hip exercise with vectors; needs verbal cues for maintaining good upright posturing to avoid trunk substitution; added walkouts for strengthening and balance.  Patient reports no issue with new exercise. Patient will benefit from continued skilled therapy services  to address deficits and promote return to optimal function.       OBJECTIVE IMPAIRMENTS Abnormal gait, cardiopulmonary status limiting activity, decreased activity tolerance, decreased endurance, decreased knowledge of condition, decreased knowledge of use of DME, decreased mobility, difficulty walking, decreased ROM, decreased strength, and impaired flexibility.   ACTIVITY LIMITATIONS carrying, lifting, bending, and standing  PARTICIPATION LIMITATIONS: cleaning, community activity, occupation, and yard work  PERSONAL FACTORS Age, Time since onset of injury/illness/exacerbation, and multiple medical issues including CABG and asbestosis hx  are also affecting patient's functional outcome.   REHAB POTENTIAL: Good  CLINICAL DECISION MAKING: Stable/uncomplicated  EVALUATION COMPLEXITY: Low   GOALS: Goals reviewed with patient? Yes  SHORT TERM GOALS: Target date: 07/19/2022  Patient will be independent with initial HEP and self-management strategies to improve functional outcomes Baseline: Initiated Goal status: IN PROGRESS    LONG TERM GOALS: Target date: 08/09/2022  Patient will be independent with advanced HEP and  self-management strategies to improve functional outcomes Baseline: Initiated STG at eval Goal status: IN PROGRESS  2.  Patient will improve 5 time sit to stand score to <12 sec seconds or less value to indicate improvement in functional outcomes Baseline: 14 seconds Goal status: IN PROGRESS  3.  Patient will  report ability to ambulate 1 mile with wife on trails for routine exercise and maintenance of symptoms with LRAD. Baseline: Limited to 3 min walk, approx 100 yrd Goal status: IN PROGRESS  4. Patient will have equal to or >4+/5 MMT throughout BIL LEs to improve ability to perform functional mobility, stair ambulation and ADLs.  Baseline: See above Goal status: IN PROGRESS     PLAN: PT FREQUENCY: 2x/week  PT DURATION: 6 weeks  PLANNED INTERVENTIONS: Therapeutic exercises, Therapeutic activity, Neuromuscular re-education, Balance training, Gait training, Patient/Family education, Self Care, Joint mobilization, Stair training, and Spinal mobilization  PLAN FOR NEXT SESSION: Progressive core strength, gross LE strengthening, lumbar ROM, progress functional mobility and tolerance with prolonged upright activities.  11:13 AM, 07/19/22 Wendelyn Kiesling Small Anis Degidio MPT [place] [facility] [place] [phone]

## 2022-07-20 ENCOUNTER — Ambulatory Visit (HOSPITAL_COMMUNITY): Payer: Medicare Other | Admitting: Physical Therapy

## 2022-07-20 DIAGNOSIS — M6281 Muscle weakness (generalized): Secondary | ICD-10-CM | POA: Diagnosis not present

## 2022-07-20 DIAGNOSIS — R262 Difficulty in walking, not elsewhere classified: Secondary | ICD-10-CM

## 2022-07-20 NOTE — Therapy (Signed)
OUTPATIENT PHYSICAL THERAPY LOWER EXTREMITY EVALUATION   Patient Name: Devin [name] MRN: [id number] DOB:03/27/1946, 76 y.o., male Today's Date: 07/20/2022   PT End of Session - 07/20/22 1154     Visit Number 6    Number of Visits 12    Date for PT Re-Evaluation 08/09/22    Authorization Type Medicare Part A    Progress Note Due on Visit 10    PT Start Time 0950    PT Stop Time 1033    PT Time Calculation (min) 43 min    Activity Tolerance Patient tolerated treatment well    Behavior During Therapy [facility] for tasks assessed/performed                 Past Medical History:  Diagnosis Date   Arthritis    Asbestosis ([place])    CAD (coronary artery disease)    Multivessel status post CABG March 2016 - LIMA to LAD, SVG to OM1, SVG to OM2, and SVG to PDA.   Essential hypertension    GERD (gastroesophageal reflux disease)    Melanoma of back (HCC)    Prostatic hypertrophy    Sleep apnea    Intolerant of CPAP   Past Surgical History:  Procedure Laterality Date   ANTERIOR CERVICAL DECOMP/DISCECTOMY FUSION     CARDIOVERSION N/A 01/28/2015   Procedure: CARDIOVERSION;  Surgeon: Arnoldo Lenis, MD;  Location: [facility];  Service: Endoscopy;  Laterality: N/A;   CHOLECYSTECTOMY N/A 04/07/2020   Procedure: LAPAROSCOPIC CHOLECYSTECTOMY;  Surgeon: Virl Cagey, MD;  Location: [facility];  Service: General;  Laterality: N/A;   CORONARY ARTERY BYPASS GRAFT N/A 12/29/2014   Procedure: CORONARY ARTERY BYPASS GRAFTING (CABG), ON PUMP, TIMES FOUR, USING LEFT INTERNAL MAMMARY ARTERY, RIGHT GREATER SAPHENOUS VEIN HARVESTED ENDOSCOPICALLY;  Surgeon: Ivin Poot, MD;  Location: [place];  Service: Open Heart Surgery;  Laterality: N/A;   KNEE ARTHROSCOPY Right    KNEE CARTILAGE SURGERY Left    LEFT HEART CATHETERIZATION WITH CORONARY ANGIOGRAM N/A 12/28/2014   Procedure: LEFT HEART CATHETERIZATION WITH CORONARY ANGIOGRAM;  Surgeon: Lorretta Harp, MD;  Location: [facility] CATH LAB;  Service:  Cardiovascular;  Laterality: N/A;   MELANOMA EXCISION     "back"   SHOULDER OPEN ROTATOR CUFF REPAIR Left    TEE WITHOUT CARDIOVERSION N/A 12/29/2014   Procedure: TRANSESOPHAGEAL ECHOCARDIOGRAM (TEE);  Surgeon: Ivin Poot, MD;  Location: [place];  Service: Open Heart Surgery;  Laterality: N/A;   TEE WITHOUT CARDIOVERSION N/A 01/28/2015   Procedure: TRANSESOPHAGEAL ECHOCARDIOGRAM (TEE);  Surgeon: Arnoldo Lenis, MD;  Location: [facility];  Service: Endoscopy;  Laterality: N/A;   Patient Active Problem List   Diagnosis Date Noted   Cough variant asthma vs UACS  02/17/2022   DOE (dyspnea on exertion) 12/07/2020   Calculus of gallbladder with acute cholecystitis without obstruction 04/06/2020   Hypogonadism in male 10/17/2019   Nocturia 10/17/2019   Erectile dysfunction due to arterial insufficiency 10/17/2019   Atrial fibrillation and flutter ([place]) 01/23/2015   Palpitations 01/23/2015   Hyperlipidemia 01/23/2015   BPH with urinary obstruction 01/23/2015   Chronic diastolic CHF (congestive heart failure) /HFpEF 01/23/2015   Atrial fibrillation with RVR ([place]) 01/23/2015   Obstructive sleep apnea--- refuses CPAP 01/18/2015   Essential hypertension 01/18/2015   Leg edema 01/18/2015   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   CAD (coronary artery disease), native coronary artery/H/o CABG x 4 12/26/2014    PCP: [name], MD   REFERRING PROVIDER: [name],  MD   REFERRING DIAG: R53.1 (ICD-10-CM) - Weakness   THERAPY DIAG:  Generalized weakness, difficulty walking  Rationale for Evaluation and Treatment Rehabilitation  ONSET DATE: 2020  SUBJECTIVE:   SUBJECTIVE STATEMENT:  Pt states that he has no pain just stiff.  Pt states he is strong in his legs he just can not stand on them for a long period of time.    PERTINENT HISTORY: CABG x4, hx of COVID, asbestosis   PAIN:  Are you having pain? 0/10, PATIENT GOALS Be able to walk with his wife on a trail. 3 miles would be ideal.   OBJECTIVE:    Lumbar ROM:   LUMBAR ROM:   Active  A/PROM  eval  Flexion 70% limited  Extension 50% limited  Right lateral flexion 50% limited  Left lateral flexion 25% limited  Right rotation   Left rotation    (Blank rows = not tested)  LOWER EXTREMITY MMT:  MMT Right eval Left eval  Hip flexion 5 4+  Hip extension    Hip abduction 5 5  Hip adduction    Hip internal rotation    Hip external rotation    Knee flexion    Knee extension 5 4+  Ankle dorsiflexion 5 4  Ankle plantarflexion    Ankle inversion    Ankle eversion     (Blank rows = not tested)    FUNCTIONAL TESTS:  5 times sit to stand: 14.1 sec 2 minute walk test: 407 feet no AD - SpO2 94% on RA HR 109  GAIT: Distance walked: 407 Assistive device utilized: None Level of assistance: Complete Independence Comments: Slightly decreased stance time on Lt, intermittent drift from midline without overt instability or LOB    TODAY'S TREATMENT: 07/20/22 Standing: Hip excursions x 3 Side step x 3 Rt with green thera-band. Functional squat x 10 Heel raises x 10  Supine:   Active hamstring stretch 3x 30" Knee to chest 3x 30" Bridge x 10  LTR x 10    Nustep seat 10 level 4 x 6 minutes for endurance and strengthening   07/19/22  Nustep seat 10 level 4 x 6 minutes for endurance and strengthening   Sitting: Hamstring stretch with strap 5 x 10" each  Standing: Sit to stand 2 x 10 Toe raises 2 x 10 Slant board 5 x 10" Hip vectors 2" hold x 8 each GTB shoulder rows 2 x 10 GTB shoulder extensions 2 x 10 Walkouts retro 5 plates x 10     9/76/7341 Standing: Step up 4"  x 10 B Heel raises x 10 Functional squat x 10 Marching x 10 Hip abduction x 10( foot hits the ground and pushes back) Hip extension x 10  Side step x 4 Rt  Palloff green t band x 10 Shoulder extension B green tband x 10 Shoulder adduction green t band x 10 B  Sit: Sit to stand x 10  Nustep seat 10 level 4 x 5 minutes for endurance and  strengthening   07/10/22 Supine: Abdominal bracing + mini sit-up 2x10 Abdominal bracing with SLR 2x10 each Bridge 2x10 Sit to stand 2x10 4" and 6" step ups with single rail support 2x10 ea  Nustep seat 11, arms 10 level 4 for 5 min for general strengthening and endurance  07/07/22 Review of HEP and goals  Supine: Abdominal bracing 5" hold x 10 Abdominal bracing with SLR x 10 each LTR x 10 Bridge x 10 Abdominal bracing with march x 10  Nustep seat 10, arms 7 level 2 for 5 min for general strengthening and endurance   Eval 2MWT 5x sit to stand MMT HEP Education   PATIENT EDUCATION:  Education details: Findings, POC, symptom awareness, HEP Person educated: Patient Education method: Explanation Education comprehension: verbalized understanding   HOME EXERCISE PROGRAM: 07/20/22: knee to chest and active hamstring 3 x 30" each one time daily   07/13/2022:   Heel raises Functional squat  Marching  Access Code: [email or web address] URL: [email or web address] Date: 07/07/2022 Prepared by: [facility]  Exercises - Supine Transversus Abdominis Bracing - Hands on Stomach  - 2 x daily - 7 x weekly - 2 sets - 10 reps - Small Range Straight Leg Raise  - 2 x daily - 7 x weekly - 2 sets - 10 reps - Standing Shoulder Row with Anchored Resistance  - 2 x daily - 7 x weekly - 2 sets - 10 reps - Supine Lower Trunk Rotation  - 2 x daily - 7 x weekly - 2 sets - 10 reps - Supine Bridge  - 2 x daily - 7 x weekly - 2 sets - 10 reps - Supine March  - 2 x daily - 7 x weekly - 2 sets - 10 reps  Access Code: [email or web address] URL: [email or web address] Date: 06/28/2022 Prepared by: Candie Mile  Exercises - Supine Transversus Abdominis Bracing - Hands on Stomach  - 2 x daily - 7 x weekly - 2 sets - 10 reps - Small Range Straight Leg Raise  - 2 x daily - 7 x weekly - 2 sets - 10 reps - Standing Shoulder Row with Anchored Resistance  - 2 x daily - 7 x weekly - 2 sets - 10  reps  ASSESSMENT:  CLINICAL IMPRESSION: Session focused on stretches vs strengthening with HEP updated with stretching exercises. Patient will benefit from continued skilled therapy services  to address deficits and promote return to optimal function.       OBJECTIVE IMPAIRMENTS Abnormal gait, cardiopulmonary status limiting activity, decreased activity tolerance, decreased endurance, decreased knowledge of condition, decreased knowledge of use of DME, decreased mobility, difficulty walking, decreased ROM, decreased strength, and impaired flexibility.   ACTIVITY LIMITATIONS carrying, lifting, bending, and standing  PARTICIPATION LIMITATIONS: cleaning, community activity, occupation, and yard work  PERSONAL FACTORS Age, Time since onset of injury/illness/exacerbation, and multiple medical issues including CABG and asbestosis hx  are also affecting patient's functional outcome.   REHAB POTENTIAL: Good  CLINICAL DECISION MAKING: Stable/uncomplicated  EVALUATION COMPLEXITY: Low   GOALS: Goals reviewed with patient? Yes  SHORT TERM GOALS: Target date: 07/19/2022  Patient will be independent with initial HEP and self-management strategies to improve functional outcomes Baseline: Initiated Goal status: IN PROGRESS    LONG TERM GOALS: Target date: 08/09/2022  Patient will be independent with advanced HEP and self-management strategies to improve functional outcomes Baseline: Initiated STG at eval Goal status: IN PROGRESS  2.  Patient will improve 5 time sit to stand score to <12 sec seconds or less value to indicate improvement in functional outcomes Baseline: 14 seconds Goal status: IN PROGRESS  3.  Patient will report ability to ambulate 1 mile with wife on trails for routine exercise and maintenance of symptoms with LRAD. Baseline: Limited to 3 min walk, approx 100 yrd Goal status: IN PROGRESS  4. Patient will have equal to or >4+/5 MMT throughout BIL LEs to improve ability to  perform functional mobility, stair ambulation and ADLs.  Baseline: See above Goal status:  IN PROGRESS     PLAN: PT FREQUENCY: 2x/week  PT DURATION: 6 weeks  PLANNED INTERVENTIONS: Therapeutic exercises, Therapeutic activity, Neuromuscular re-education, Balance training, Gait training, Patient/Family education, Self Care, Joint mobilization, Stair training, and Spinal mobilization  PLAN FOR NEXT SESSION: Progressive core strength, gross LE strengthening, lumbar ROM, progress functional mobility and tolerance with prolonged upright activities.  Rayetta Humphrey, PT CLT [phone]  11:55AM

## 2022-07-25 ENCOUNTER — Ambulatory Visit (HOSPITAL_COMMUNITY): Payer: Medicare Other | Admitting: Physical Therapy

## 2022-07-25 ENCOUNTER — Encounter (HOSPITAL_COMMUNITY): Payer: Self-pay | Admitting: Physical Therapy

## 2022-07-25 DIAGNOSIS — R262 Difficulty in walking, not elsewhere classified: Secondary | ICD-10-CM | POA: Diagnosis not present

## 2022-07-25 DIAGNOSIS — M6281 Muscle weakness (generalized): Secondary | ICD-10-CM | POA: Diagnosis not present

## 2022-07-25 NOTE — Therapy (Signed)
OUTPATIENT PHYSICAL THERAPY LOWER EXTREMITY EVALUATION   Patient Name: Devin [name] MRN: [id number] DOB:August 11, 1946, 76 y.o., male Today's Date: 07/25/2022   PT End of Session - 07/25/22 1439     Visit Number 7    Number of Visits 12    Date for PT Re-Evaluation 08/09/22    Authorization Type Medicare Part A    Progress Note Due on Visit 10    PT Start Time 0109    PT Stop Time 1521    PT Time Calculation (min) 44 min    Activity Tolerance Patient tolerated treatment well    Behavior During Therapy [facility] for tasks assessed/performed                 Past Medical History:  Diagnosis Date   Arthritis    Asbestosis ([place])    CAD (coronary artery disease)    Multivessel status post CABG March 2016 - LIMA to LAD, SVG to OM1, SVG to OM2, and SVG to PDA.   Essential hypertension    GERD (gastroesophageal reflux disease)    Melanoma of back (HCC)    Prostatic hypertrophy    Sleep apnea    Intolerant of CPAP   Past Surgical History:  Procedure Laterality Date   ANTERIOR CERVICAL DECOMP/DISCECTOMY FUSION     CARDIOVERSION N/A 01/28/2015   Procedure: CARDIOVERSION;  Surgeon: Arnoldo Lenis, MD;  Location: [facility];  Service: Endoscopy;  Laterality: N/A;   CHOLECYSTECTOMY N/A 04/07/2020   Procedure: LAPAROSCOPIC CHOLECYSTECTOMY;  Surgeon: Virl Cagey, MD;  Location: [facility];  Service: General;  Laterality: N/A;   CORONARY ARTERY BYPASS GRAFT N/A 12/29/2014   Procedure: CORONARY ARTERY BYPASS GRAFTING (CABG), ON PUMP, TIMES FOUR, USING LEFT INTERNAL MAMMARY ARTERY, RIGHT GREATER SAPHENOUS VEIN HARVESTED ENDOSCOPICALLY;  Surgeon: Ivin Poot, MD;  Location: [place];  Service: Open Heart Surgery;  Laterality: N/A;   KNEE ARTHROSCOPY Right    KNEE CARTILAGE SURGERY Left    LEFT HEART CATHETERIZATION WITH CORONARY ANGIOGRAM N/A 12/28/2014   Procedure: LEFT HEART CATHETERIZATION WITH CORONARY ANGIOGRAM;  Surgeon: Lorretta Harp, MD;  Location: [facility] CATH LAB;  Service:  Cardiovascular;  Laterality: N/A;   MELANOMA EXCISION     "back"   SHOULDER OPEN ROTATOR CUFF REPAIR Left    TEE WITHOUT CARDIOVERSION N/A 12/29/2014   Procedure: TRANSESOPHAGEAL ECHOCARDIOGRAM (TEE);  Surgeon: Ivin Poot, MD;  Location: [place];  Service: Open Heart Surgery;  Laterality: N/A;   TEE WITHOUT CARDIOVERSION N/A 01/28/2015   Procedure: TRANSESOPHAGEAL ECHOCARDIOGRAM (TEE);  Surgeon: Arnoldo Lenis, MD;  Location: [facility];  Service: Endoscopy;  Laterality: N/A;   Patient Active Problem List   Diagnosis Date Noted   Cough variant asthma vs UACS  02/17/2022   DOE (dyspnea on exertion) 12/07/2020   Calculus of gallbladder with acute cholecystitis without obstruction 04/06/2020   Hypogonadism in male 10/17/2019   Nocturia 10/17/2019   Erectile dysfunction due to arterial insufficiency 10/17/2019   Atrial fibrillation and flutter ([place]) 01/23/2015   Palpitations 01/23/2015   Hyperlipidemia 01/23/2015   BPH with urinary obstruction 01/23/2015   Chronic diastolic CHF (congestive heart failure) /HFpEF 01/23/2015   Atrial fibrillation with RVR ([place]) 01/23/2015   Obstructive sleep apnea--- refuses CPAP 01/18/2015   Essential hypertension 01/18/2015   Leg edema 01/18/2015   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   CAD (coronary artery disease), native coronary artery/H/o CABG x 4 12/26/2014    PCP: [name], MD   REFERRING PROVIDER: [name],  MD   REFERRING DIAG: R53.1 (ICD-10-CM) - Weakness   THERAPY DIAG:  Generalized weakness, difficulty walking  Rationale for Evaluation and Treatment Rehabilitation  ONSET DATE: 2020  SUBJECTIVE:   SUBJECTIVE STATEMENT:  Notices improvements in his function. Able to stand majority of the day at an auction this weekend. He was very tired afterwards however.  PERTINENT HISTORY: CABG x4, hx of COVID, asbestosis   PAIN:  Are you having pain? 0/10, PATIENT GOALS Be able to walk with his wife on a trail. 3 miles would be ideal.   OBJECTIVE:    Lumbar ROM:   LUMBAR ROM:   Active  A/PROM  eval  Flexion 70% limited  Extension 50% limited  Right lateral flexion 50% limited  Left lateral flexion 25% limited  Right rotation   Left rotation    (Blank rows = not tested)  LOWER EXTREMITY MMT:  MMT Right eval Left eval  Hip flexion 5 4+  Hip extension    Hip abduction 5 5  Hip adduction    Hip internal rotation    Hip external rotation    Knee flexion    Knee extension 5 4+  Ankle dorsiflexion 5 4  Ankle plantarflexion    Ankle inversion    Ankle eversion     (Blank rows = not tested)    FUNCTIONAL TESTS:  5 times sit to stand: 14.1 sec 2 minute walk test: 407 feet no AD - SpO2 94% on RA HR 109  GAIT: Distance walked: 407 Assistive device utilized: None Level of assistance: Complete Independence Comments: Slightly decreased stance time on Lt, intermittent drift from midline without overt instability or LOB    TODAY'S TREATMENT: 07/25/22 Hip abduction 3# 2x10 Hip extension 3# 2x10 Hip flexion 3# 2x10 Hamstring curl 3# 2x10  Side step x 3 Rt with green thera-band around ankles. Functional squat 2x10 Heel raises 2x 10    Nustep seat 10 level 4 x 6 minutes for endurance and strengthening   07/20/22 Standing: Hip excursions x 3 Side step x 3 Rt with green thera-band. Functional squat x 10 Heel raises x 10  Supine:   Active hamstring stretch 3x 30" Knee to chest 3x 30" Bridge x 10  LTR x 10    Nustep seat 10 level 4 x 6 minutes for endurance and strengthening   07/19/22  Nustep seat 10 level 4 x 6 minutes for endurance and strengthening   Sitting: Hamstring stretch with strap 5 x 10" each  Standing: Sit to stand 2 x 10 Toe raises 2 x 10 Slant board 5 x 10" Hip vectors 2" hold x 8 each GTB shoulder rows 2 x 10 GTB shoulder extensions 2 x 10 Walkouts retro 5 plates x 10     1/91/4782 Standing: Step up 4"  x 10 B Heel raises x 10 Functional squat x 10 Marching x 10 Hip  abduction x 10( foot hits the ground and pushes back) Hip extension x 10  Side step x 4 Rt  Palloff green t band x 10 Shoulder extension B green tband x 10 Shoulder adduction green t band x 10 B  Sit: Sit to stand x 10  Nustep seat 10 level 4 x 5 minutes for endurance and strengthening     PATIENT EDUCATION:  Education details: Findings, POC, symptom awareness, HEP Person educated: Patient Education method: Explanation Education comprehension: verbalized understanding   HOME EXERCISE PROGRAM: 07/20/22: knee to chest and active hamstring 3 x 30" each one time  daily   07/13/2022:   Heel raises Functional squat  Marching  Access Code: [email or web address] URL: [email or web address] Date: 07/07/2022 Prepared by: [facility] - [facility]  Exercises - Supine Transversus Abdominis Bracing - Hands on Stomach  - 2 x daily - 7 x weekly - 2 sets - 10 reps - Small Range Straight Leg Raise  - 2 x daily - 7 x weekly - 2 sets - 10 reps - Standing Shoulder Row with Anchored Resistance  - 2 x daily - 7 x weekly - 2 sets - 10 reps - Supine Lower Trunk Rotation  - 2 x daily - 7 x weekly - 2 sets - 10 reps - Supine Bridge  - 2 x daily - 7 x weekly - 2 sets - 10 reps - Supine March  - 2 x daily - 7 x weekly - 2 sets - 10 reps  Access Code: [email or web address] URL: [email or web address] Date: 06/28/2022 Prepared by: Candie Mile  Exercises - Supine Transversus Abdominis Bracing - Hands on Stomach  - 2 x daily - 7 x weekly - 2 sets - 10 reps - Small Range Straight Leg Raise  - 2 x daily - 7 x weekly - 2 sets - 10 reps - Standing Shoulder Row with Anchored Resistance  - 2 x daily - 7 x weekly - 2 sets - 10 reps  ASSESSMENT:  CLINICAL IMPRESSION: Progressed functional strength with added load to LE exercises. Cues for core stability throughout. Pt reports hip fatigue but denies pain throughout session Patient will continue to benefit from skilled physical therapy services to further improve  independence with functional mobility.     OBJECTIVE IMPAIRMENTS Abnormal gait, cardiopulmonary status limiting activity, decreased activity tolerance, decreased endurance, decreased knowledge of condition, decreased knowledge of use of DME, decreased mobility, difficulty walking, decreased ROM, decreased strength, and impaired flexibility.   ACTIVITY LIMITATIONS carrying, lifting, bending, and standing  PARTICIPATION LIMITATIONS: cleaning, community activity, occupation, and yard work  PERSONAL FACTORS Age, Time since onset of injury/illness/exacerbation, and multiple medical issues including CABG and asbestosis hx  are also affecting patient's functional outcome.   REHAB POTENTIAL: Good  CLINICAL DECISION MAKING: Stable/uncomplicated  EVALUATION COMPLEXITY: Low   GOALS: Goals reviewed with patient? Yes  SHORT TERM GOALS: Target date: 07/19/2022  Patient will be independent with initial HEP and self-management strategies to improve functional outcomes Baseline: Initiated Goal status: IN PROGRESS    LONG TERM GOALS: Target date: 08/09/2022  Patient will be independent with advanced HEP and self-management strategies to improve functional outcomes Baseline: Initiated STG at eval Goal status: IN PROGRESS  2.  Patient will improve 5 time sit to stand score to <12 sec seconds or less value to indicate improvement in functional outcomes Baseline: 14 seconds Goal status: IN PROGRESS  3.  Patient will report ability to ambulate 1 mile with wife on trails for routine exercise and maintenance of symptoms with LRAD. Baseline: Limited to 3 min walk, approx 100 yrd Goal status: IN PROGRESS  4. Patient will have equal to or >4+/5 MMT throughout BIL LEs to improve ability to perform functional mobility, stair ambulation and ADLs.  Baseline: See above Goal status: IN PROGRESS     PLAN: PT FREQUENCY: 2x/week  PT DURATION: 6 weeks  PLANNED INTERVENTIONS: Therapeutic exercises,  Therapeutic activity, Neuromuscular re-education, Balance training, Gait training, Patient/Family education, Self Care, Joint mobilization, Stair training, and Spinal mobilization  PLAN FOR NEXT SESSION: Progressive core strength, gross LE strengthening, lumbar ROM, progress functional mobility and tolerance with  prolonged upright activities.  Candie Mile, PT, DPT Physical Therapist Acute Rehabilitation [facility]

## 2022-07-27 ENCOUNTER — Ambulatory Visit (HOSPITAL_COMMUNITY): Payer: Medicare Other | Admitting: Physical Therapy

## 2022-07-27 DIAGNOSIS — R262 Difficulty in walking, not elsewhere classified: Secondary | ICD-10-CM

## 2022-07-27 DIAGNOSIS — M6281 Muscle weakness (generalized): Secondary | ICD-10-CM

## 2022-07-27 NOTE — Therapy (Signed)
OUTPATIENT PHYSICAL THERAPY LOWER EXTREMITY EVALUATION   Patient Name: Devin [name] MRN: [id number] DOB:September 24, 1946, 76 y.o., male Today's Date: 07/27/2022   PT End of Session - 07/27/22 1432     Visit Number 8    Number of Visits 12    Date for PT Re-Evaluation 08/09/22    Authorization Type Medicare Part A    Progress Note Due on Visit 10    PT Start Time 1430    PT Stop Time 1510    PT Time Calculation (min) 40 min    Activity Tolerance Patient tolerated treatment well    Behavior During Therapy [facility] for tasks assessed/performed                 Past Medical History:  Diagnosis Date   Arthritis    Asbestosis ([place])    CAD (coronary artery disease)    Multivessel status post CABG March 2016 - LIMA to LAD, SVG to OM1, SVG to OM2, and SVG to PDA.   Essential hypertension    GERD (gastroesophageal reflux disease)    Melanoma of back (HCC)    Prostatic hypertrophy    Sleep apnea    Intolerant of CPAP   Past Surgical History:  Procedure Laterality Date   ANTERIOR CERVICAL DECOMP/DISCECTOMY FUSION     CARDIOVERSION N/A 01/28/2015   Procedure: CARDIOVERSION;  Surgeon: Arnoldo Lenis, MD;  Location: [facility];  Service: Endoscopy;  Laterality: N/A;   CHOLECYSTECTOMY N/A 04/07/2020   Procedure: LAPAROSCOPIC CHOLECYSTECTOMY;  Surgeon: Virl Cagey, MD;  Location: [facility];  Service: General;  Laterality: N/A;   CORONARY ARTERY BYPASS GRAFT N/A 12/29/2014   Procedure: CORONARY ARTERY BYPASS GRAFTING (CABG), ON PUMP, TIMES FOUR, USING LEFT INTERNAL MAMMARY ARTERY, RIGHT GREATER SAPHENOUS VEIN HARVESTED ENDOSCOPICALLY;  Surgeon: Ivin Poot, MD;  Location: [place];  Service: Open Heart Surgery;  Laterality: N/A;   KNEE ARTHROSCOPY Right    KNEE CARTILAGE SURGERY Left    LEFT HEART CATHETERIZATION WITH CORONARY ANGIOGRAM N/A 12/28/2014   Procedure: LEFT HEART CATHETERIZATION WITH CORONARY ANGIOGRAM;  Surgeon: Lorretta Harp, MD;  Location: [facility] CATH LAB;  Service:  Cardiovascular;  Laterality: N/A;   MELANOMA EXCISION     "back"   SHOULDER OPEN ROTATOR CUFF REPAIR Left    TEE WITHOUT CARDIOVERSION N/A 12/29/2014   Procedure: TRANSESOPHAGEAL ECHOCARDIOGRAM (TEE);  Surgeon: Ivin Poot, MD;  Location: [place];  Service: Open Heart Surgery;  Laterality: N/A;   TEE WITHOUT CARDIOVERSION N/A 01/28/2015   Procedure: TRANSESOPHAGEAL ECHOCARDIOGRAM (TEE);  Surgeon: Arnoldo Lenis, MD;  Location[facility];  Service: Endoscopy;  Laterality: N/A;   Patient Active Problem List   Diagnosis Date Noted   Cough variant asthma vs UACS  02/17/2022   DOE (dyspnea on exertion) 12/07/2020   Calculus of gallbladder with acute cholecystitis without obstruction 04/06/2020   Hypogonadism in male 10/17/2019   Nocturia 10/17/2019   Erectile dysfunction due to arterial insufficiency 10/17/2019   Atrial fibrillation and flutter ([place]) 01/23/2015   Palpitations 01/23/2015   Hyperlipidemia 01/23/2015   BPH with urinary obstruction 01/23/2015   Chronic diastolic CHF (congestive heart failure) /HFpEF 01/23/2015   Atrial fibrillation with RVR ([place]) 01/23/2015   Obstructive sleep apnea--- refuses CPAP 01/18/2015   Essential hypertension 01/18/2015   Leg edema 01/18/2015   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   CAD (coronary artery disease), native coronary artery/H/o CABG x 4 12/26/2014    PCP: [name], MD   REFERRING PROVIDER: [name],  MD   REFERRING DIAG: R53.1 (ICD-10-CM) - Weakness   THERAPY DIAG:  Generalized weakness, difficulty walking  Rationale for Evaluation and Treatment Rehabilitation  ONSET DATE: 2020  SUBJECTIVE:   SUBJECTIVE STATEMENT:   Doing better. Not sure about stronger, but he is feeling better. Able to do more now. Lungs are better as long as he takes his medicine.   PERTINENT HISTORY: CABG x4, hx of COVID, asbestosis   PAIN:  Are you having pain? Yes: NPRS scale: 3/10 Pain location: LT knee Pain description: "wore out" aching Aggravating  factors: turning, twisting, standing Relieving factors: rest    PATIENT GOALS Be able to walk with his wife on a trail. 3 miles would be ideal.   OBJECTIVE:   Lumbar ROM:   LUMBAR ROM:   Active  A/PROM  eval  Flexion 70% limited  Extension 50% limited  Right lateral flexion 50% limited  Left lateral flexion 25% limited  Right rotation   Left rotation    (Blank rows = not tested)  LOWER EXTREMITY MMT:  MMT Right eval Left eval  Hip flexion 5 4+  Hip extension    Hip abduction 5 5  Hip adduction    Hip internal rotation    Hip external rotation    Knee flexion    Knee extension 5 4+  Ankle dorsiflexion 5 4  Ankle plantarflexion    Ankle inversion    Ankle eversion     (Blank rows = not tested)    FUNCTIONAL TESTS:  5 times sit to stand: 14.1 sec 2 minute walk test: 407 feet no AD - SpO2 94% on RA HR 109  GAIT: Distance walked: 407 Assistive device utilized: None Level of assistance: Complete Independence Comments: Slightly decreased stance time on Lt, intermittent drift from midline without overt instability or LOB    TODAY'S TREATMENT: 07/27/22 Nu step (seat 12) lv 5 8 min for strength and conditioning  Hip abduction 3# 2x15 Hip extension 3# 2x15 Hip flexion (marching) 3# 2x15 Hamstring curl 3# 2x15 Step up 4 inch x 20 each   Clinic ambulation 2 RT with 3 # ankle weights  Functional squat 2x10 (to chair for depth cue)  Heel raises 2x 10   07/25/22 Hip abduction 3# 2x10 Hip extension 3# 2x10 Hip flexion 3# 2x10 Hamstring curl 3# 2x10  Side step x 3 Rt with green thera-band around ankles. Functional squat 2x10 Heel raises 2x 10    Nustep seat 10 level 4 x 6 minutes for endurance and strengthening   07/20/22 Standing: Hip excursions x 3 Side step x 3 Rt with green thera-band. Functional squat x 10 Heel raises x 10  Supine:   Active hamstring stretch 3x 30" Knee to chest 3x 30" Bridge x 10  LTR x 10    Nustep seat 10 level 4 x 6  minutes for endurance and strengthening    PATIENT EDUCATION:  Education details: Findings, POC, symptom awareness, HEP Person educated: Patient Education method: Explanation Education comprehension: verbalized understanding   HOME EXERCISE PROGRAM: 07/20/22: knee to chest and active hamstring 3 x 30" each one time daily   07/13/2022:   Heel raises Functional squat  Marching  Access Code: [email or web address] URL: [email or web address] Date: 07/07/2022 Prepared by: [facility]  Exercises - Supine Transversus Abdominis Bracing - Hands on Stomach  - 2 x daily - 7 x weekly - 2 sets - 10 reps - Small Range Straight Leg Raise  - 2 x daily - 7 x  weekly - 2 sets - 10 reps - Standing Shoulder Row with Anchored Resistance  - 2 x daily - 7 x weekly - 2 sets - 10 reps - Supine Lower Trunk Rotation  - 2 x daily - 7 x weekly - 2 sets - 10 reps - Supine Bridge  - 2 x daily - 7 x weekly - 2 sets - 10 reps - Supine March  - 2 x daily - 7 x weekly - 2 sets - 10 reps  Access Code: [email or web address] URL: [email or web address] Date: 06/28/2022 Prepared by: Candie Mile  Exercises - Supine Transversus Abdominis Bracing - Hands on Stomach  - 2 x daily - 7 x weekly - 2 sets - 10 reps - Small Range Straight Leg Raise  - 2 x daily - 7 x weekly - 2 sets - 10 reps - Standing Shoulder Row with Anchored Resistance  - 2 x daily - 7 x weekly - 2 sets - 10 reps  ASSESSMENT:  CLINICAL IMPRESSION: Patient tolerated session well today with very little rest breaks. Fairly continuous activity for conditioning. Patient did note some hip pain toward end of session with weighted clinic ambulation but resolved with rest. Patient did require min verbal cues for proper form with standing hip extension and abduction. Reporting fatigue end of session. Patient will continue to benefit from skilled therapy services to reduce remaining deficits and improve functional ability.   OBJECTIVE IMPAIRMENTS Abnormal gait,  cardiopulmonary status limiting activity, decreased activity tolerance, decreased endurance, decreased knowledge of condition, decreased knowledge of use of DME, decreased mobility, difficulty walking, decreased ROM, decreased strength, and impaired flexibility.   ACTIVITY LIMITATIONS carrying, lifting, bending, and standing  PARTICIPATION LIMITATIONS: cleaning, community activity, occupation, and yard work  PERSONAL FACTORS Age, Time since onset of injury/illness/exacerbation, and multiple medical issues including CABG and asbestosis hx  are also affecting patient's functional outcome.   REHAB POTENTIAL: Good  CLINICAL DECISION MAKING: Stable/uncomplicated  EVALUATION COMPLEXITY: Low   GOALS: Goals reviewed with patient? Yes  SHORT TERM GOALS: Target date: 07/19/2022  Patient will be independent with initial HEP and self-management strategies to improve functional outcomes Baseline: Initiated Goal status: IN PROGRESS    LONG TERM GOALS: Target date: 08/09/2022  Patient will be independent with advanced HEP and self-management strategies to improve functional outcomes Baseline: Initiated STG at eval Goal status: IN PROGRESS  2.  Patient will improve 5 time sit to stand score to <12 sec seconds or less value to indicate improvement in functional outcomes Baseline: 14 seconds Goal status: IN PROGRESS  3.  Patient will report ability to ambulate 1 mile with wife on trails for routine exercise and maintenance of symptoms with LRAD. Baseline: Limited to 3 min walk, approx 100 yrd Goal status: IN PROGRESS  4. Patient will have equal to or >4+/5 MMT throughout BIL LEs to improve ability to perform functional mobility, stair ambulation and ADLs.  Baseline: See above Goal status: IN PROGRESS     PLAN: PT FREQUENCY: 2x/week  PT DURATION: 6 weeks  PLANNED INTERVENTIONS: Therapeutic exercises, Therapeutic activity, Neuromuscular re-education, Balance training, Gait training,  Patient/Family education, Self Care, Joint mobilization, Stair training, and Spinal mobilization  PLAN FOR NEXT SESSION: Progressive core strength, gross LE strengthening, lumbar ROM, progress functional mobility and tolerance with prolonged upright activities.  2:33 PM, 07/27/22 Josue Hector PT DPT  Physical Therapist with [facility]  [phone]

## 2022-08-01 ENCOUNTER — Ambulatory Visit (HOSPITAL_COMMUNITY): Payer: Medicare Other | Attending: Internal Medicine | Admitting: Physical Therapy

## 2022-08-01 DIAGNOSIS — M6281 Muscle weakness (generalized): Secondary | ICD-10-CM | POA: Diagnosis not present

## 2022-08-01 DIAGNOSIS — R262 Difficulty in walking, not elsewhere classified: Secondary | ICD-10-CM

## 2022-08-01 NOTE — Therapy (Signed)
OUTPATIENT PHYSICAL THERAPY LOWER EXTREMITY EVALUATION   Patient Name: Devin [name] MRN: [id number] DOB:May 31, 1946, 76 y.o., male Today's Date: 08/01/2022   PT End of Session - 08/01/22 0952     Visit Number 9    Number of Visits 12    Date for PT Re-Evaluation 08/09/22    Authorization Type Medicare Part A    Progress Note Due on Visit 10    PT Start Time [phone]    PT Stop Time 1029    PT Time Calculation (min) 38 min    Activity Tolerance Patient tolerated treatment well    Behavior During Therapy [facility] for tasks assessed/performed                 Past Medical History:  Diagnosis Date   Arthritis    Asbestosis ([place])    CAD (coronary artery disease)    Multivessel status post CABG March 2016 - LIMA to LAD, SVG to OM1, SVG to OM2, and SVG to PDA.   Essential hypertension    GERD (gastroesophageal reflux disease)    Melanoma of back (HCC)    Prostatic hypertrophy    Sleep apnea    Intolerant of CPAP   Past Surgical History:  Procedure Laterality Date   ANTERIOR CERVICAL DECOMP/DISCECTOMY FUSION     CARDIOVERSION N/A 01/28/2015   Procedure: CARDIOVERSION;  Surgeon: Arnoldo Lenis, MD;  Location: [facility];  Service: Endoscopy;  Laterality: N/A;   CHOLECYSTECTOMY N/A 04/07/2020   Procedure: LAPAROSCOPIC CHOLECYSTECTOMY;  Surgeon: Virl Cagey, MD;  Location: [facility];  Service: General;  Laterality: N/A;   CORONARY ARTERY BYPASS GRAFT N/A 12/29/2014   Procedure: CORONARY ARTERY BYPASS GRAFTING (CABG), ON PUMP, TIMES FOUR, USING LEFT INTERNAL MAMMARY ARTERY, RIGHT GREATER SAPHENOUS VEIN HARVESTED ENDOSCOPICALLY;  Surgeon: Ivin Poot, MD;  Location: [place];  Service: Open Heart Surgery;  Laterality: N/A;   KNEE ARTHROSCOPY Right    KNEE CARTILAGE SURGERY Left    LEFT HEART CATHETERIZATION WITH CORONARY ANGIOGRAM N/A 12/28/2014   Procedure: LEFT HEART CATHETERIZATION WITH CORONARY ANGIOGRAM;  Surgeon: Lorretta Harp, MD;  Location: [facility] CATH LAB;  Service:  Cardiovascular;  Laterality: N/A;   MELANOMA EXCISION     "back"   SHOULDER OPEN ROTATOR CUFF REPAIR Left    TEE WITHOUT CARDIOVERSION N/A 12/29/2014   Procedure: TRANSESOPHAGEAL ECHOCARDIOGRAM (TEE);  Surgeon: Ivin Poot, MD;  Location: [place];  Service: Open Heart Surgery;  Laterality: N/A;   TEE WITHOUT CARDIOVERSION N/A 01/28/2015   Procedure: TRANSESOPHAGEAL ECHOCARDIOGRAM (TEE);  Surgeon: Arnoldo Lenis, MD;  Location: [facility];  Service: Endoscopy;  Laterality: N/A;   Patient Active Problem List   Diagnosis Date Noted   Cough variant asthma vs UACS  02/17/2022   DOE (dyspnea on exertion) 12/07/2020   Calculus of gallbladder with acute cholecystitis without obstruction 04/06/2020   Hypogonadism in male 10/17/2019   Nocturia 10/17/2019   Erectile dysfunction due to arterial insufficiency 10/17/2019   Atrial fibrillation and flutter ([place]) 01/23/2015   Palpitations 01/23/2015   Hyperlipidemia 01/23/2015   BPH with urinary obstruction 01/23/2015   Chronic diastolic CHF (congestive heart failure) /HFpEF 01/23/2015   Atrial fibrillation with RVR ([place]) 01/23/2015   Obstructive sleep apnea--- refuses CPAP 01/18/2015   Essential hypertension 01/18/2015   Leg edema 01/18/2015   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   CAD (coronary artery disease), native coronary artery/H/o CABG x 4 12/26/2014    PCP: [name], MD   REFERRING PROVIDER: [name],  MD   REFERRING DIAG: R53.1 (ICD-10-CM) - Weakness   THERAPY DIAG:  Generalized weakness, difficulty walking  Rationale for Evaluation and Treatment Rehabilitation  ONSET DATE: 2020  SUBJECTIVE:   SUBJECTIVE STATEMENT:   Feeling slow this morning. He did a lot of packing and cleaning out at his house yesterday.   PERTINENT HISTORY: CABG x4, hx of COVID, asbestosis   PAIN:  Are you having pain? Yes: NPRS scale: 3/10 Pain location: LT heel Pain description: "wore out" aching Aggravating factors: turning, twisting, standing Relieving  factors: rest    PATIENT GOALS Be able to walk with his wife on a trail. 3 miles would be ideal.   OBJECTIVE:   Lumbar ROM:   LUMBAR ROM:   Active  A/PROM  eval  Flexion 70% limited  Extension 50% limited  Right lateral flexion 50% limited  Left lateral flexion 25% limited  Right rotation   Left rotation    (Blank rows = not tested)  LOWER EXTREMITY MMT:  MMT Right eval Left eval  Hip flexion 5 4+  Hip extension    Hip abduction 5 5  Hip adduction    Hip internal rotation    Hip external rotation    Knee flexion    Knee extension 5 4+  Ankle dorsiflexion 5 4  Ankle plantarflexion    Ankle inversion    Ankle eversion     (Blank rows = not tested)    FUNCTIONAL TESTS:  5 times sit to stand: 14.1 sec 2 minute walk test: 407 feet no AD - SpO2 94% on RA HR 109  GAIT: Distance walked: 407 Assistive device utilized: None Level of assistance: Complete Independence Comments: Slightly decreased stance time on Lt, intermittent drift from midline without overt instability or LOB    TODAY'S TREATMENT: 08/01/22 Lunge stretch at step 10 x 5"  Nu step (seat 12) lv 5 5 min for strength and conditioning  Hip abduction 3# 2x15 Hip extension 3# 2x15 Hip flexion (marching) 3# 2x15 Hamstring curl 3# 2x15 Stairs 4 inch HHA x 1 5 RT   Clinic ambulation 2 RT with 3 # ankle weights  Functional squat 2x10 (to chair for depth cue)  Heel raises 2x 10  Toe raises 2 x 10  07/27/22 Nu step (seat 12) lv 5 8 min for strength and conditioning  Hip abduction 3# 2x15 Hip extension 3# 2x15 Hip flexion (marching) 3# 2x15 Hamstring curl 3# 2x15 Step up 4 inch x 20 each   Clinic ambulation 2 RT with 3 # ankle weights  Functional squat 2x10 (to chair for depth cue)  Heel raises 2x 10   07/25/22 Hip abduction 3# 2x10 Hip extension 3# 2x10 Hip flexion 3# 2x10 Hamstring curl 3# 2x10  Side step x 3 Rt with green thera-band around ankles. Functional squat 2x10 Heel raises 2x  10    Nustep seat 10 level 4 x 6 minutes for endurance and strengthening   07/20/22 Standing: Hip excursions x 3 Side step x 3 Rt with green thera-band. Functional squat x 10 Heel raises x 10  Supine:   Active hamstring stretch 3x 30" Knee to chest 3x 30" Bridge x 10  LTR x 10    Nustep seat 10 level 4 x 6 minutes for endurance and strengthening    PATIENT EDUCATION:  Education details: Findings, POC, symptom awareness, HEP Person educated: Patient Education method: Explanation Education comprehension: verbalized understanding   HOME EXERCISE PROGRAM: 07/20/22: knee to chest and active hamstring 3  x 30" each one time daily   07/13/2022:   Heel raises Functional squat  Marching  Access Code: [email or web address] URL: [email or web address] Date: 07/07/2022 Prepared by: [facility] - [facility]  Exercises - Supine Transversus Abdominis Bracing - Hands on Stomach  - 2 x daily - 7 x weekly - 2 sets - 10 reps - Small Range Straight Leg Raise  - 2 x daily - 7 x weekly - 2 sets - 10 reps - Standing Shoulder Row with Anchored Resistance  - 2 x daily - 7 x weekly - 2 sets - 10 reps - Supine Lower Trunk Rotation  - 2 x daily - 7 x weekly - 2 sets - 10 reps - Supine Bridge  - 2 x daily - 7 x weekly - 2 sets - 10 reps - Supine March  - 2 x daily - 7 x weekly - 2 sets - 10 reps  Access Code: [email or web address] URL: [email or web address] Date: 06/28/2022 Prepared by: Candie Mile  Exercises - Supine Transversus Abdominis Bracing - Hands on Stomach  - 2 x daily - 7 x weekly - 2 sets - 10 reps - Small Range Straight Leg Raise  - 2 x daily - 7 x weekly - 2 sets - 10 reps - Standing Shoulder Row with Anchored Resistance  - 2 x daily - 7 x weekly - 2 sets - 10 reps  ASSESSMENT:  CLINICAL IMPRESSION: Patient tolerated session well today. Continued with established POC for LE strength and improved activity tolerance. Added stair ambulation with good return. Patient did note some discomfort  in knees with this but showing good stability with reciprocal pattern. Patient does require frequent verbal cues for tracking reps of given exercise and requires demo for distinguishing proper form between hip extension and standing hamstring curl. Patient will continue to benefit from skilled therapy services to reduce remaining deficits and improve functional ability.    OBJECTIVE IMPAIRMENTS Abnormal gait, cardiopulmonary status limiting activity, decreased activity tolerance, decreased endurance, decreased knowledge of condition, decreased knowledge of use of DME, decreased mobility, difficulty walking, decreased ROM, decreased strength, and impaired flexibility.   ACTIVITY LIMITATIONS carrying, lifting, bending, and standing  PARTICIPATION LIMITATIONS: cleaning, community activity, occupation, and yard work  PERSONAL FACTORS Age, Time since onset of injury/illness/exacerbation, and multiple medical issues including CABG and asbestosis hx  are also affecting patient's functional outcome.   REHAB POTENTIAL: Good  CLINICAL DECISION MAKING: Stable/uncomplicated  EVALUATION COMPLEXITY: Low   GOALS: Goals reviewed with patient? Yes  SHORT TERM GOALS: Target date: 07/19/2022  Patient will be independent with initial HEP and self-management strategies to improve functional outcomes Baseline: Initiated Goal status: IN PROGRESS    LONG TERM GOALS: Target date: 08/09/2022  Patient will be independent with advanced HEP and self-management strategies to improve functional outcomes Baseline: Initiated STG at eval Goal status: IN PROGRESS  2.  Patient will improve 5 time sit to stand score to <12 sec seconds or less value to indicate improvement in functional outcomes Baseline: 14 seconds Goal status: IN PROGRESS  3.  Patient will report ability to ambulate 1 mile with wife on trails for routine exercise and maintenance of symptoms with LRAD. Baseline: Limited to 3 min walk, approx 100  yrd Goal status: IN PROGRESS  4. Patient will have equal to or >4+/5 MMT throughout BIL LEs to improve ability to perform functional mobility, stair ambulation and ADLs.  Baseline: See above Goal status: IN PROGRESS     PLAN: PT FREQUENCY: 2x/week  PT DURATION:  6 weeks  PLANNED INTERVENTIONS: Therapeutic exercises, Therapeutic activity, Neuromuscular re-education, Balance training, Gait training, Patient/Family education, Self Care, Joint mobilization, Stair training, and Spinal mobilization  PLAN FOR NEXT SESSION: Progressive core strength, gross LE strengthening, lumbar ROM, progress functional mobility and tolerance with prolonged upright activities.  9:53 AM, 08/01/22 Josue Hector PT DPT  Physical Therapist with [facility]  [phone]

## 2022-08-03 ENCOUNTER — Ambulatory Visit (HOSPITAL_COMMUNITY): Payer: Medicare Other | Admitting: Physical Therapy

## 2022-08-03 ENCOUNTER — Encounter (HOSPITAL_COMMUNITY): Payer: Self-pay | Admitting: Physical Therapy

## 2022-08-03 DIAGNOSIS — R262 Difficulty in walking, not elsewhere classified: Secondary | ICD-10-CM

## 2022-08-03 DIAGNOSIS — M6281 Muscle weakness (generalized): Secondary | ICD-10-CM | POA: Diagnosis not present

## 2022-08-03 NOTE — Therapy (Signed)
OUTPATIENT PHYSICAL THERAPY LOWER EXTREMITY EVALUATION   Patient Name: Devin [name] MRN: [id number] DOB:25-Jul-1946, 76 y.o., male Today's Date: 08/03/2022  PHYSICAL THERAPY DISCHARGE SUMMARY  Visits from Start of Care: 10  Current functional level related to goals / functional outcomes: See below   Remaining deficits: See below   Education / Equipment: See assessment    Patient agrees to discharge. Patient goals were partially met. Patient is being discharged due to being pleased with the current functional level.   PT End of Session - 08/03/22 0951     Visit Number 10    Number of Visits 12    Date for PT Re-Evaluation 08/09/22    Authorization Type Medicare Part A    Progress Note Due on Visit 10    PT Start Time 0949    PT Stop Time 1028    PT Time Calculation (min) 39 min    Activity Tolerance Patient tolerated treatment well    Behavior During Therapy [facility] for tasks assessed/performed                 Past Medical History:  Diagnosis Date   Arthritis    Asbestosis ([place])    CAD (coronary artery disease)    Multivessel status post CABG March 2016 - LIMA to LAD, SVG to OM1, SVG to OM2, and SVG to PDA.   Essential hypertension    GERD (gastroesophageal reflux disease)    Melanoma of back (HCC)    Prostatic hypertrophy    Sleep apnea    Intolerant of CPAP   Past Surgical History:  Procedure Laterality Date   ANTERIOR CERVICAL DECOMP/DISCECTOMY FUSION     CARDIOVERSION N/A 01/28/2015   Procedure: CARDIOVERSION;  Surgeon: Arnoldo Lenis, MD;  Location: [facility];  Service: Endoscopy;  Laterality: N/A;   CHOLECYSTECTOMY N/A 04/07/2020   Procedure: LAPAROSCOPIC CHOLECYSTECTOMY;  Surgeon: Virl Cagey, MD;  Location: [facility];  Service: General;  Laterality: N/A;   CORONARY ARTERY BYPASS GRAFT N/A 12/29/2014   Procedure: CORONARY ARTERY BYPASS GRAFTING (CABG), ON PUMP, TIMES FOUR, USING LEFT INTERNAL MAMMARY ARTERY, RIGHT GREATER SAPHENOUS VEIN  HARVESTED ENDOSCOPICALLY;  Surgeon: Ivin Poot, MD;  Location: [place];  Service: Open Heart Surgery;  Laterality: N/A;   KNEE ARTHROSCOPY Right    KNEE CARTILAGE SURGERY Left    LEFT HEART CATHETERIZATION WITH CORONARY ANGIOGRAM N/A 12/28/2014   Procedure: LEFT HEART CATHETERIZATION WITH CORONARY ANGIOGRAM;  Surgeon: Lorretta Harp, MD;  Location: [facility] CATH LAB;  Service: Cardiovascular;  Laterality: N/A;   MELANOMA EXCISION     "back"   SHOULDER OPEN ROTATOR CUFF REPAIR Left    TEE WITHOUT CARDIOVERSION N/A 12/29/2014   Procedure: TRANSESOPHAGEAL ECHOCARDIOGRAM (TEE);  Surgeon: Ivin Poot, MD;  Location: [place];  Service: Open Heart Surgery;  Laterality: N/A;   TEE WITHOUT CARDIOVERSION N/A 01/28/2015   Procedure: TRANSESOPHAGEAL ECHOCARDIOGRAM (TEE);  Surgeon: Arnoldo Lenis, MD;  Location: [facility];  Service: Endoscopy;  Laterality: N/A;   Patient Active Problem List   Diagnosis Date Noted   Cough variant asthma vs UACS  02/17/2022   DOE (dyspnea on exertion) 12/07/2020   Calculus of gallbladder with acute cholecystitis without obstruction 04/06/2020   Hypogonadism in male 10/17/2019   Nocturia 10/17/2019   Erectile dysfunction due to arterial insufficiency 10/17/2019   Atrial fibrillation and flutter ([place]) 01/23/2015   Palpitations 01/23/2015   Hyperlipidemia 01/23/2015   BPH with urinary obstruction 01/23/2015   Chronic diastolic CHF (congestive heart  failure) /HFpEF 01/23/2015   Atrial fibrillation with RVR ([place]) 01/23/2015   Obstructive sleep apnea--- refuses CPAP 01/18/2015   Essential hypertension 01/18/2015   Leg edema 01/18/2015   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 A little stiff this AM. Feels ready to DC to HEP. Would like to do exercise at home at this time.   PERTINENT HISTORY: CABG x4, hx of COVID, asbestosis   PAIN:  Are you having pain? Yes: NPRS scale: 3/10 Pain location: LT heel Pain description: "wore out" aching Aggravating factors: turning, twisting, standing Relieving factors: rest    PATIENT GOALS Be able to walk with his wife on a trail. 3 miles would be ideal.   OBJECTIVE:   Lumbar ROM:   LUMBAR ROM:   Active  A/PROM  eval  Flexion 70% limited  Extension 50% limited  Right lateral flexion 50% limited  Left lateral flexion 25% limited  Right rotation   Left rotation    (Blank rows = not tested)  LOWER EXTREMITY MMT:  MMT Right eval Left eval RT 08/03/22 LT 08/03/22  Hip flexion 5 4+ 5 5  Hip extension      Hip abduction _0 Hip adduction      Hip internal rotation      Hip external rotation      Knee flexion      Knee extension 5 4+ 5 5  Ankle dorsiflexion _1 Ankle plantarflexion      Ankle inversion      Ankle eversion       (Blank rows = not tested)    FUNCTIONAL TESTS:  5 times sit to stand: 11.6 sec 2 minute walk test: 407 feet no AD - SpO2 94% on RA HR 109  GAIT: Distance walked: 407 Assistive device utilized: None Level of assistance: Complete Independence Comments: Slightly decreased stance time on Lt, intermittent drift from midline without overt instability or LOB    TODAY'S TREATMENT: 08/03/22 Nu step (seat 10) lv 5 8 min for strength and conditioning  MMT 5 x STS Clinic amb HEP Review   08/01/22 Lunge stretch at step 10 x 5"  Nu step (seat 12) lv 5 5 min for strength and conditioning  Hip abduction 3# 2x15 Hip extension 3# 2x15 Hip flexion (marching) 3# 2x15 Hamstring curl 3# 2x15 Stairs 4 inch HHA x 1 5 RT   Clinic ambulation 2 RT with 3 # ankle weights  Functional squat 2x10 (to  chair for depth cue)  Heel raises 2x 10  Toe raises 2 x 10  07/27/22 Nu step (seat 12) lv 5 8 min for strength and conditioning  Hip abduction 3# 2x15 Hip extension 3# 2x15 Hip flexion (marching) 3# 2x15 Hamstring curl 3# 2x15 Step up 4 inch x 20 each   Clinic ambulation 2 RT with 3 # ankle weights  Functional squat 2x10 (to chair for depth cue)  Heel raises 2x 10    PATIENT EDUCATION:  Education details: Findings, POC, symptom awareness, HEP Person educated: Patient Education method: Explanation Education comprehension: verbalized understanding  HOME EXERCISE PROGRAM:  Access Code: [email or web address] URL: [email or web address] Date: 08/03/2022 Prepared by: Josue Hector  Exercises - Supine Transversus Abdominis Bracing - Hands on Stomach  - 1-2 x daily - 5-7 x weekly - 2 sets - 10 reps - Small Range Straight Leg Raise  - 1-2 x daily - 5-7 x weekly - 2 sets - 10 reps - Supine Lower Trunk Rotation  - 1-2 x daily - 5-7 x weekly - 2 sets - 10 reps - Supine Bridge  - 1-2 x daily - 5-7 x weekly - 2 sets - 10 reps - Supine March  - 1-2 x daily - 5-7 x weekly - 2 sets - 10 reps - Heel Raises with Counter Support  - 1-2 x daily - 5-7 x weekly - 3 sets - 10 reps - Toe Raises with Counter Support  - 1-2 x daily - 5-7 x weekly - 3 sets - 10 reps - Standing Shoulder Row with Anchored Resistance  - 1-2 x daily - 5-7 x weekly - 3 sets - 10 reps - Sit to Stand Without Arm Support  - 1-2 x daily - 5-7 x weekly - 3 sets - 10 reps - Standing Marching  - 1-2 x daily - 5-7 x weekly - 3 sets - 10 reps - Standing Hip Extension  - 1-2 x daily - 5-7 x weekly - 3 sets - 10 reps - Standing Hip Abduction AROM  - 1-2 x daily - 5-7 x weekly - 3 sets - 10 reps - Standing Knee Flexion AROM  - 1-2 x daily - 5-7 x weekly - 3 sets - 10 reps - Tandem Stance with Support  - 1-2 x daily - 5-7 x weekly - 1 sets - 3 reps - 30 sec hold - Side Stepping with Counter Support  - 1-2 x daily - 5-7 x weekly -  1 sets - 10 reps  07/20/22: knee to chest and active hamstring 3 x 30" each one time daily   07/13/2022:   Heel raises Functional squat  Marching  Access Code: [email or web address] URL: [email or web address] Date: 07/07/2022 Prepared by: [facility]  Exercises - Supine Transversus Abdominis Bracing - Hands on Stomach  - 2 x daily - 7 x weekly - 2 sets - 10 reps - Small Range Straight Leg Raise  - 2 x daily - 7 x weekly - 2 sets - 10 reps - Standing Shoulder Row with Anchored Resistance  - 2 x daily - 7 x weekly - 2 sets - 10 reps - Supine Lower Trunk Rotation  - 2 x daily - 7 x weekly - 2 sets - 10 reps - Supine Bridge  - 2 x daily - 7 x weekly - 2 sets - 10 reps - Supine March  - 2 x daily - 7 x weekly - 2 sets - 10 reps  Access Code: [email or web address] URL: [email or web address] Date: 06/28/2022 Prepared by: Candie Mile  Exercises - Supine Transversus Abdominis Bracing - Hands on Stomach  - 2 x daily - 7 x weekly - 2 sets - 10 reps - Small Range Straight Leg Raise  - 2 x daily - 7 x weekly - 2 sets - 10 reps - Standing Shoulder Row with Anchored Resistance  - 2 x daily - 7 x weekly - 2 sets - 10 reps  ASSESSMENT:  CLINICAL IMPRESSION: Patient has made good progress to therapy goals. Showing improved strength and ambulation. Still remains limited by fatigue but activity tolerance has significantly  improved since initial eval. Reviewed comprehensive HEP and answered all patient questions. Issued updated HEP handout. Encouraged patient to follow up with therapy services with any further questions or concerns.   OBJECTIVE IMPAIRMENTS Abnormal gait, cardiopulmonary status limiting activity, decreased activity tolerance, decreased endurance, decreased knowledge of condition, decreased knowledge of use of DME, decreased mobility, difficulty walking, decreased ROM, decreased strength, and impaired flexibility.   ACTIVITY LIMITATIONS carrying, lifting, bending, and  standing  PARTICIPATION LIMITATIONS: cleaning, community activity, occupation, and yard work  PERSONAL FACTORS Age, Time since onset of injury/illness/exacerbation, and multiple medical issues including CABG and asbestosis hx  are also affecting patient's functional outcome.   REHAB POTENTIAL: Good  CLINICAL DECISION MAKING: Stable/uncomplicated  EVALUATION COMPLEXITY: Low   GOALS: Goals reviewed with patient? Yes  SHORT TERM GOALS: Target date: 07/19/2022  Patient will be independent with initial HEP and self-management strategies to improve functional outcomes Baseline: Initiated Goal status: MET    LONG TERM GOALS: Target date: 08/09/2022  Patient will be independent with advanced HEP and self-management strategies to improve functional outcomes Baseline: Reviewed HEP and answered all questions Goal status: MET  2.  Patient will improve 5 time sit to stand score to <12 sec seconds or less value to indicate improvement in functional outcomes Baseline: 11.6 seconds Goal status: MET  3.  Patient will report ability to ambulate 1 mile with wife on trails for routine exercise and maintenance of symptoms with LRAD. Baseline:Hasn't tried this, he has been busy with trying to sell his house Goal status: NOT MET  4. Patient will have equal to or >4+/5 MMT throughout BIL LEs to improve ability to perform functional mobility, stair ambulation and ADLs.  Baseline: See MMT Goal status: Partially MET (all except LT ankle DF)   PLAN: PT FREQUENCY: 2x/week   PT DURATION: 6 weeks  PLANNED INTERVENTIONS: Therapeutic exercises, Therapeutic activity, Neuromuscular re-education, Balance training, Gait training, Patient/Family education, Self Care, Joint mobilization, Stair training, and Spinal mobilization  PLAN FOR NEXT SESSION:  DC to HEP   9:52 AM, 08/03/22 Josue Hector PT DPT  Physical Therapist with [facility]  [phone]

## 2022-08-08 ENCOUNTER — Encounter (HOSPITAL_COMMUNITY): Payer: Medicare Other | Admitting: Physical Therapy

## 2022-08-10 ENCOUNTER — Encounter (HOSPITAL_COMMUNITY): Payer: Medicare Other | Admitting: Physical Therapy

## 2022-08-16 ENCOUNTER — Ambulatory Visit: Payer: Medicare Other | Attending: Cardiology | Admitting: Cardiology

## 2022-08-16 ENCOUNTER — Encounter: Payer: Self-pay | Admitting: Cardiology

## 2022-08-16 VITALS — BP 140/80 | HR 72 | Ht 72.0 in | Wt 277.4 lb

## 2022-08-16 DIAGNOSIS — I25119 Atherosclerotic heart disease of native coronary artery with unspecified angina pectoris: Secondary | ICD-10-CM | POA: Insufficient documentation

## 2022-08-16 DIAGNOSIS — E782 Mixed hyperlipidemia: Secondary | ICD-10-CM | POA: Diagnosis not present

## 2022-08-16 DIAGNOSIS — I10 Essential (primary) hypertension: Secondary | ICD-10-CM | POA: Diagnosis not present

## 2022-08-16 NOTE — Progress Notes (Signed)
Cardiology Office Note  Date: 08/16/2022   ID: Devin [name], Devin [name] [birth year], MRN [id number]  PCP:  [name], MD  Cardiologist:  Rozann Lesches, MD Electrophysiologist:  None   Chief Complaint  Patient presents with   Cardiac follow-up    History of Present Illness: Devin [name] is a 76 y.o. male last seen in June 2022 by Mr. Leonides Sake NP, I reviewed the note.  He is here for a routine visit.  Reports no angina and stable NYHA class II dyspnea with typical activities.  I reviewed his medications which are outlined below and stable from a cardiac perspective.  No intolerances to Lipitor.  He had lab work with Dr. Willey Blade back in March at which point his LDL was 79.  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  Past Medical History:  Diagnosis Date   Arthritis    Asbestosis ([place])    CAD (coronary artery disease)    Multivessel status post CABG March 2016 - LIMA to LAD, SVG to OM1, SVG to OM2, and SVG to PDA.   Essential hypertension    GERD (gastroesophageal reflux disease)    Melanoma of back (HCC)    Prostatic hypertrophy    Sleep apnea    Intolerant of CPAP    Past Surgical History:  Procedure Laterality Date   ANTERIOR CERVICAL DECOMP/DISCECTOMY FUSION     CARDIOVERSION N/A 01/28/2015   Procedure: CARDIOVERSION;  Surgeon: Arnoldo Lenis, MD;  Location: [facility];  Service: Endoscopy;  Laterality: N/A;   CHOLECYSTECTOMY N/A 04/07/2020   Procedure: LAPAROSCOPIC CHOLECYSTECTOMY;  Surgeon: Virl Cagey, MD;  Location: [facility];  Service: General;  Laterality: N/A;   CORONARY ARTERY BYPASS GRAFT N/A 12/29/2014   Procedure: CORONARY ARTERY BYPASS GRAFTING (CABG), ON PUMP, TIMES FOUR, USING LEFT INTERNAL MAMMARY ARTERY, RIGHT GREATER SAPHENOUS VEIN HARVESTED ENDOSCOPICALLY;  Surgeon: Ivin Poot, MD;  Location: [place];  Service: Open Heart Surgery;  Laterality: N/A;   KNEE  ARTHROSCOPY Right    KNEE CARTILAGE SURGERY Left    LEFT HEART CATHETERIZATION WITH CORONARY ANGIOGRAM N/A 12/28/2014   Procedure: LEFT HEART CATHETERIZATION WITH CORONARY ANGIOGRAM;  Surgeon: Lorretta Harp, MD;  Location[facility] CATH LAB;  Service: Cardiovascular;  Laterality: N/A;   MELANOMA EXCISION     "back"   SHOULDER OPEN ROTATOR CUFF REPAIR Left    TEE WITHOUT CARDIOVERSION N/A 12/29/2014   Procedure: TRANSESOPHAGEAL ECHOCARDIOGRAM (TEE);  Surgeon: Ivin Poot, MD;  Location: [place];  Service: Open Heart Surgery;  Laterality: N/A;   TEE WITHOUT CARDIOVERSION N/A 01/28/2015   Procedure: TRANSESOPHAGEAL ECHOCARDIOGRAM (TEE);  Surgeon: Arnoldo Lenis, MD;  Location: [facility];  Service: Endoscopy;  Laterality: N/A;    Current Outpatient Medications  Medication Sig Dispense Refill   aspirin 81 MG tablet Take 1 tablet (81 mg total) by mouth daily with breakfast. For Heart 30 tablet 5   atorvastatin (LIPITOR) 10 MG tablet Take 10 mg by mouth daily.     clonazePAM (KLONOPIN) 0.5 MG tablet 0.5 mg at bedtime.      cyanocobalamin (,VITAMIN B-12,) 1000 MCG/ML injection INJECT 1ML AS DIRECTED EVERY 4 WEEKS     doxazosin (CARDURA) 4 MG tablet TAKE 1 TABLET(4 MG) BY MOUTH IN THE MORNING AND AT BEDTIME 180 tablet 3   famotidine (PEPCID) 20 MG tablet One after supper 30 tablet 11   metoprolol succinate (TOPROL XL) 25 MG 24  hr tablet Take 1 tablet (25 mg total) by mouth daily. For Heart 30 tablet 11   naproxen sodium (ALEVE) 220 MG tablet Take 220 mg by mouth as needed.     pantoprazole (PROTONIX) 40 MG tablet TAKE 1 TABLET (40 MG TOTAL) BY MOUTH DAILY. TAKE 30-60 MIN BEFORE FIRST MEAL OF THE DAY 90 tablet 0   PROAIR HFA 108 (90 Base) MCG/ACT inhaler Inhale 2 puffs into the lungs every 4 (four) hours as needed for wheezing or shortness of breath. 18 g 1   testosterone cypionate (DEPOTESTOSTERONE CYPIONATE) 200 MG/ML injection ADMINISTER 2 ML(400 MG) IN THE MUSCLE EVERY 14 DAYS 10 mL 1   torsemide  (DEMADEX) 20 MG tablet Take 10 mg by mouth daily.     No current facility-administered medications for this visit.   Allergies:  Lasix [furosemide]   ROS:  No syncope.  Physical Exam: VS:  BP (!) 140/80   Pulse 72   Ht 6' (1.829 m)   Wt 277 lb 6.4 oz (125.8 kg)   SpO2 95%   BMI 37.62 kg/m , BMI Body mass index is 37.62 kg/m.  Wt Readings from Last 3 Encounters:  08/16/22 277 lb 6.4 oz (125.8 kg)  03/06/22 286 lb 6.4 oz (129.9 kg)  02/17/22 285 lb 6.4 oz (129.5 kg)    General: Patient appears comfortable at rest. HEENT: Conjunctiva and lids normal. Neck: Supple, no elevated JVP or carotid bruits, no thyromegaly. Lungs: Coarse breath sounds with scattered rhonchi but no wheezing, nonlabored breathing at rest. Cardiac: Regular rate and rhythm, no S3 or significant systolic murmur, no pericardial rub. Extremities: No pitting edema.  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  Recent Labwork: March 2023: Hemoglobin 16.6, platelets 152, BUN 17, creatinine 1.12, potassium 4.7, AST 17, ALT 18, cholesterol 139, triglycerides 160, HDL 32, LDL 79 06/28/2022: Hemoglobin 14.4   Other Studies Reviewed Today:  Echocardiogram 04/07/2020:  1. Left ventricular ejection fraction, by estimation, is 55 to 60%. The  left ventricle has normal function. The left ventricle has no regional  wall motion abnormalities. There is mild left ventricular hypertrophy.  Left ventricular diastolic parameters  are consistent with Grade II diastolic dysfunction (pseudonormalization).  There is the interventricular septum is flattened in diastole ('D' shaped  left ventricle), consistent with right ventricular volume overload.   2. Right ventricular systolic function is mildly to moderately reduced.  The right ventricular size is moderately enlarged. There is normal  pulmonary artery systolic  pressure. The estimated right ventricular  systolic pressure is 01.0 mmHg.   3. Left atrial size was mildly dilated.   4. Right atrial size was mildly dilated.   5. The mitral valve is abnormal, mildly thickened and calcified with  noderate to severe annular calcification. Trivial mitral valve  regurgitation.   6. The aortic valve is tricuspid. Aortic valve regurgitation is trivial.   7. The inferior vena cava is normal in size with greater than 50%  respiratory variability, suggesting right atrial pressure of 3 mmHg.   Assessment and Plan:  1.  Multivessel CAD status post CABG in 2016.  He is doing well without active angina, last LVEF 55 to 60% in 2021.  ECG reviewed and stable.  Plan to continue observation for now on medical therapy including aspirin, Toprol-XL, and Lipitor.  2.  Mixed hyperlipidemia, on Lipitor with last LDL 79.  3.  Essential  hypertension, blood pressure mildly elevated today.  He remains on Toprol-XL.  No distinct upward trend in blood pressure as yet, no changes were made.  Could consider addition of ARB if necessary.  Medication Adjustments/Labs and Tests Ordered: Current medicines are reviewed at length with the patient today.  Concerns regarding medicines are outlined above.   Tests Ordered: Orders Placed This Encounter  Procedures   EKG 12-Lead    Medication Changes: No orders of the defined types were placed in this encounter.   Disposition:  Follow up  1 year.  Signed, Satira Sark, MD, [facility] 08/16/2022 1:59 PM     [facility] [address], [place], [place] [address] Phone: [phone]; Fax: [phone]

## 2022-08-16 NOTE — Patient Instructions (Signed)
Medication Instructions:  Your physician recommends that you continue on your current medications as directed. Please refer to the Current Medication list given to you today.   Labwork: None today  Testing/Procedures: None today  Follow-Up: 1 year  Any Other Special Instructions Will Be Listed Below (If Applicable).  If you need a refill on your cardiac medications before your next appointment, please call your pharmacy.  

## 2022-09-19 DIAGNOSIS — Z79899 Other long term (current) drug therapy: Secondary | ICD-10-CM | POA: Diagnosis not present

## 2022-09-19 DIAGNOSIS — R252 Cramp and spasm: Secondary | ICD-10-CM | POA: Diagnosis not present

## 2022-09-19 DIAGNOSIS — I2510 Atherosclerotic heart disease of native coronary artery without angina pectoris: Secondary | ICD-10-CM | POA: Diagnosis not present

## 2022-09-19 DIAGNOSIS — I10 Essential (primary) hypertension: Secondary | ICD-10-CM | POA: Diagnosis not present

## 2022-09-19 DIAGNOSIS — E785 Hyperlipidemia, unspecified: Secondary | ICD-10-CM | POA: Diagnosis not present

## 2022-09-19 DIAGNOSIS — Z23 Encounter for immunization: Secondary | ICD-10-CM | POA: Diagnosis not present

## 2022-09-19 DIAGNOSIS — R609 Edema, unspecified: Secondary | ICD-10-CM | POA: Diagnosis not present

## 2022-10-09 ENCOUNTER — Emergency Department (HOSPITAL_COMMUNITY)
Admission: EM | Admit: 2022-10-09 | Discharge: 2022-10-09 | Disposition: A | Discharge status: Home / Self Care | Payer: Medicare Other | Attending: Emergency Medicine | Admitting: Emergency Medicine

## 2022-10-09 ENCOUNTER — Emergency Department (HOSPITAL_COMMUNITY): Payer: Medicare Other

## 2022-10-09 ENCOUNTER — Other Ambulatory Visit: Payer: Self-pay

## 2022-10-09 ENCOUNTER — Encounter (HOSPITAL_COMMUNITY): Payer: Self-pay

## 2022-10-09 DIAGNOSIS — R918 Other nonspecific abnormal finding of lung field: Secondary | ICD-10-CM | POA: Diagnosis not present

## 2022-10-09 DIAGNOSIS — I6523 Occlusion and stenosis of bilateral carotid arteries: Secondary | ICD-10-CM | POA: Diagnosis not present

## 2022-10-09 DIAGNOSIS — R0789 Other chest pain: Secondary | ICD-10-CM | POA: Diagnosis not present

## 2022-10-09 DIAGNOSIS — Z79899 Other long term (current) drug therapy: Secondary | ICD-10-CM | POA: Diagnosis not present

## 2022-10-09 DIAGNOSIS — Y92828 Other wilderness area as the place of occurrence of the external cause: Secondary | ICD-10-CM | POA: Diagnosis not present

## 2022-10-09 DIAGNOSIS — Z041 Encounter for examination and observation following transport accident: Secondary | ICD-10-CM | POA: Diagnosis not present

## 2022-10-09 DIAGNOSIS — I10 Essential (primary) hypertension: Secondary | ICD-10-CM | POA: Diagnosis not present

## 2022-10-09 DIAGNOSIS — I2510 Atherosclerotic heart disease of native coronary artery without angina pectoris: Secondary | ICD-10-CM | POA: Insufficient documentation

## 2022-10-09 DIAGNOSIS — M79652 Pain in left thigh: Secondary | ICD-10-CM

## 2022-10-09 DIAGNOSIS — R0781 Pleurodynia: Secondary | ICD-10-CM | POA: Diagnosis not present

## 2022-10-09 DIAGNOSIS — I672 Cerebral atherosclerosis: Secondary | ICD-10-CM | POA: Diagnosis not present

## 2022-10-09 DIAGNOSIS — Z043 Encounter for examination and observation following other accident: Secondary | ICD-10-CM | POA: Diagnosis not present

## 2022-10-09 DIAGNOSIS — Z7982 Long term (current) use of aspirin: Secondary | ICD-10-CM | POA: Insufficient documentation

## 2022-10-09 DIAGNOSIS — S79922A Unspecified injury of left thigh, initial encounter: Secondary | ICD-10-CM | POA: Diagnosis not present

## 2022-10-09 DIAGNOSIS — R079 Chest pain, unspecified: Secondary | ICD-10-CM | POA: Diagnosis not present

## 2022-10-09 DIAGNOSIS — Y9389 Activity, other specified: Secondary | ICD-10-CM | POA: Insufficient documentation

## 2022-10-09 LAB — CBC
HCT: 48.7 % (ref 39.0–52.0)
Hemoglobin: 15.7 g/dL (ref 13.0–17.0)
MCH: 32.1 pg (ref 26.0–34.0)
MCHC: 32.2 g/dL (ref 30.0–36.0)
MCV: 99.6 fL (ref 80.0–100.0)
Platelets: 166 10*3/uL (ref 150–400)
RBC: 4.89 MIL/uL (ref 4.22–5.81)
RDW: 13.7 % (ref 11.5–15.5)
WBC: 5.5 10*3/uL (ref 4.0–10.5)
nRBC: 0 % (ref 0.0–0.2)

## 2022-10-09 LAB — BASIC METABOLIC PANEL
Anion gap: 8 (ref 5–15)
BUN: 16 mg/dL (ref 8–23)
CO2: 27 mmol/L (ref 22–32)
Calcium: 8.8 mg/dL — ABNORMAL LOW (ref 8.9–10.3)
Chloride: 102 mmol/L (ref 98–111)
Creatinine, Ser: 1.08 mg/dL (ref 0.61–1.24)
GFR, Estimated: >60 mL/min (ref >60)
Glucose, Bld: 101 mg/dL — ABNORMAL HIGH (ref 70–99)
Potassium: 4.1 mmol/L (ref 3.5–5.1)
Sodium: 137 mmol/L (ref 135–145)

## 2022-10-09 LAB — TYPE AND SCREEN
ABO/RH(D): A POS
Antibody Screen: NEGATIVE

## 2022-10-09 MED ORDER — IOHEXOL 300 MG/ML  SOLN
75.0000 mL | Freq: Once | INTRAMUSCULAR | Status: AC | PRN
  Administered 2022-10-09: 75 mL via INTRAVENOUS
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
## 2022-10-24 ENCOUNTER — Emergency Department (HOSPITAL_COMMUNITY)
Admission: EM | Admit: 2022-10-24 | Discharge: 2022-10-24 | Disposition: A | Discharge status: Home / Self Care | Payer: Medicare Other | Attending: Emergency Medicine | Admitting: Emergency Medicine

## 2022-10-24 ENCOUNTER — Emergency Department (HOSPITAL_COMMUNITY): Payer: Medicare Other

## 2022-10-24 ENCOUNTER — Other Ambulatory Visit: Payer: Self-pay

## 2022-10-24 ENCOUNTER — Encounter (HOSPITAL_COMMUNITY): Payer: Self-pay | Admitting: *Deleted

## 2022-10-24 DIAGNOSIS — R9389 Abnormal findings on diagnostic imaging of other specified body structures: Secondary | ICD-10-CM | POA: Diagnosis not present

## 2022-10-24 DIAGNOSIS — Z7982 Long term (current) use of aspirin: Secondary | ICD-10-CM | POA: Insufficient documentation

## 2022-10-24 DIAGNOSIS — Z79899 Other long term (current) drug therapy: Secondary | ICD-10-CM | POA: Insufficient documentation

## 2022-10-24 DIAGNOSIS — I509 Heart failure, unspecified: Secondary | ICD-10-CM | POA: Insufficient documentation

## 2022-10-24 DIAGNOSIS — I110 Hypertensive heart disease with heart failure: Secondary | ICD-10-CM | POA: Diagnosis not present

## 2022-10-24 DIAGNOSIS — R0602 Shortness of breath: Secondary | ICD-10-CM | POA: Diagnosis not present

## 2022-10-24 LAB — CBC
HCT: 44.5 % (ref 39.0–52.0)
Hemoglobin: 14.3 g/dL (ref 13.0–17.0)
MCH: 32.1 pg (ref 26.0–34.0)
MCHC: 32.1 g/dL (ref 30.0–36.0)
MCV: 99.8 fL (ref 80.0–100.0)
Platelets: 173 10*3/uL (ref 150–400)
RBC: 4.46 MIL/uL (ref 4.22–5.81)
RDW: 14.1 % (ref 11.5–15.5)
WBC: 4.6 10*3/uL (ref 4.0–10.5)
nRBC: 0 % (ref 0.0–0.2)

## 2022-10-24 LAB — TROPONIN I (HIGH SENSITIVITY)
Troponin I (High Sensitivity): 4 ng/L (ref <18)
Troponin I (High Sensitivity): 5 ng/L (ref <18)

## 2022-10-24 LAB — BRAIN NATRIURETIC PEPTIDE: B Natriuretic Peptide: 124 pg/mL — ABNORMAL HIGH (ref 0.0–100.0)

## 2022-10-24 LAB — BASIC METABOLIC PANEL
Anion gap: 7 (ref 5–15)
BUN: 22 mg/dL (ref 8–23)
CO2: 22 mmol/L (ref 22–32)
Calcium: 8.3 mg/dL — ABNORMAL LOW (ref 8.9–10.3)
Chloride: 105 mmol/L (ref 98–111)
Creatinine, Ser: 1.2 mg/dL (ref 0.61–1.24)
GFR, Estimated: >60 mL/min (ref >60)
Glucose, Bld: 100 mg/dL — ABNORMAL HIGH (ref 70–99)
Potassium: 5.7 mmol/L — ABNORMAL HIGH (ref 3.5–5.1)
Sodium: 134 mmol/L — ABNORMAL LOW (ref 135–145)

## 2022-10-24 MED ORDER — ONDANSETRON HCL 4 MG/2ML IJ SOLN
4.0000 mg | Freq: Once | INTRAMUSCULAR | Status: DC
  Filled 2022-10-24: qty 2

## 2022-10-24 MED ORDER — FUROSEMIDE 10 MG/ML IJ SOLN
40.0000 mg | Freq: Once | INTRAMUSCULAR | Status: DC
  Filled 2022-10-24: qty 4

## 2022-10-24 NOTE — Discharge Instructions (Signed)
Take your medications as directed.  See your Physicain for recheck.  Your heart test are normal today

## 2022-10-24 NOTE — ED Triage Notes (Signed)
Pt in c/o SOB and stomach upset onset last night, SOB episodes worse with sleeping and exertion, takes Turosemide and has not taken it x 2 days d/t traveling and not having access to the bathroom, pt reports nausea denies v/d, A&O x4

## 2022-10-25 NOTE — ED Provider Notes (Signed)
[facility] EMERGENCY DEPARTMENT Provider Note   CSN: [id number] Arrival date & time: 10/24/22  0930     History  Chief Complaint  Patient presents with   Shortness of Breath    Devin [name] is a 76 y.o. male.  Complains of swelling in bilateral ankles and some shortness of breath.  Patient reports that he takes furosemide at home but he has not been taking for the past 2 days because he has been traveling.  Patient's family is concerned about patient sleeping they report that he falls asleep and then startles awake.  They report loud snoring before he wakes.  Patient reports that he has a history of sleep apnea and has not been able to tolerate CPAP so he does not use.  Patient has a past medical history of congestive heart failure hypertension and coronary artery disease.  He denies having any chest pain.  The history is provided by the patient. No language interpreter was used.  Shortness of Breath Severity:  Moderate Onset quality:  Gradual Timing:  Constant Progression:  Worsening Chronicity:  New Relieved by:  Nothing Worsened by:  Nothing Ineffective treatments:  None tried Associated symptoms: abdominal pain   Risk factors: obesity        Home Medications Prior to Admission medications   Medication Sig Start Date End Date Taking? Authorizing Provider  aspirin 81 MG tablet Take 1 tablet (81 mg total) by mouth daily with breakfast. For Heart 04/08/20  Yes Emokpae, Courage, MD  atorvastatin (LIPITOR) 10 MG tablet Take 10 mg by mouth daily.   Yes [provider]  clonazePAM (KLONOPIN) 0.5 MG tablet 0.5 mg at bedtime.  02/22/15  Yes [provider]  cyanocobalamin (,VITAMIN B-12,) 1000 MCG/ML injection INJECT 1ML AS DIRECTED EVERY 4 WEEKS 06/10/19  Yes [provider]  doxazosin (CARDURA) 4 MG tablet TAKE 1 TABLET(4 MG) BY MOUTH IN THE MORNING AND AT BEDTIME 07/06/22  Yes Irine Seal, MD  famotidine (PEPCID) 20 MG tablet One after  supper Patient taking differently: Take 20 mg by mouth daily. One after supper 02/17/22  Yes Tanda Rockers, MD  metoprolol succinate (TOPROL XL) 25 MG 24 hr tablet Take 1 tablet (25 mg total) by mouth daily. For Heart 04/08/20 10/24/22 Yes Emokpae, Courage, MD  torsemide (DEMADEX) 20 MG tablet Take 10 mg by mouth daily.   Yes [provider]  pantoprazole (PROTONIX) 40 MG tablet TAKE 1 TABLET (40 MG TOTAL) BY MOUTH DAILY. TAKE 30-60 MIN BEFORE FIRST MEAL OF THE DAY Patient not taking: Reported on 10/24/2022 05/22/22   Tanda Rockers, MD  PROAIR HFA 108 [phone] Base) MCG/ACT inhaler Inhale 2 puffs into the lungs every 4 (four) hours as needed for wheezing or shortness of breath. Patient not taking: Reported on 10/24/2022 04/08/20   Roxan Hockey, MD  testosterone cypionate (DEPOTESTOSTERONE CYPIONATE) 200 MG/ML injection ADMINISTER 2 ML(400 MG) IN THE MUSCLE EVERY 14 DAYS Patient not taking: Reported on 10/24/2022 07/06/22   Irine Seal, MD      Allergies    Lasix [furosemide]    Review of Systems   Review of Systems  Respiratory:  Positive for shortness of breath.   Gastrointestinal:  Positive for abdominal pain.  All other systems reviewed and are negative.   Physical Exam Updated Vital Signs BP (!) 168/82   Pulse 71   Temp 97.8 F (36.6 C) (Oral)   Resp 16   Ht 6' (1.829 m)   Wt 122.5 kg  SpO2 94%   BMI 36.62 kg/m  Physical Exam Vitals and nursing note reviewed.  Constitutional:      Appearance: He is well-developed.  HENT:     Head: Normocephalic.  Cardiovascular:     Rate and Rhythm: Normal rate and regular rhythm.  Pulmonary:     Effort: Pulmonary effort is normal.  Abdominal:     General: There is no distension.     Palpations: Abdomen is soft.     Tenderness: There is no abdominal tenderness.  Musculoskeletal:        General: Normal range of motion.     Cervical back: Normal range of motion.  Skin:    General: Skin is warm.  Neurological:     Mental  Status: He is alert and oriented to person, place, and time.     ED Results / Procedures / Treatments   Labs (all labs ordered are listed, but only abnormal results are displayed) Labs Reviewed  BRAIN NATRIURETIC PEPTIDE - Abnormal; Notable for the following components:      Result Value   B Natriuretic Peptide 124.0 (*)    All other components within normal limits  BASIC METABOLIC PANEL - Abnormal; Notable for the following components:   Sodium 134 (*)    Potassium 5.7 (*)    Glucose, Bld 100 (*)    Calcium 8.3 (*)    All other components within normal limits  CBC  TROPONIN I (HIGH SENSITIVITY)  TROPONIN I (HIGH SENSITIVITY)    EKG EKG Interpretation  Date/Time:  Tuesday October 24 2022 10:25:17 EST Ventricular Rate:  75 PR Interval:  170 QRS Duration: 156 QT Interval:  415 QTC Calculation: 464 R Axis:   -73 Text Interpretation: Sinus rhythm RBBB and LAFB Confirmed by Tretha Sciara [phone]) on 10/24/2022 4:09:53 PM  Radiology DG Chest 2 View  Result Date: 10/24/2022 CLINICAL DATA:  Shortness of breath.  Abnormal chest x-ray EXAM: CHEST - 2 VIEW COMPARISON:  Same day chest x-ray.  10/09/2022 FINDINGS: Repeat chest x-ray was performed with nipple markers. Previously described nodular density at the lateral left lung base did not persist, possibly a small focus of atelectasis. Mildly coarsened interstitial markings. No new airspace consolidation. No pleural effusion or pneumothorax. Stable heart size status post sternotomy and CABG. IMPRESSION: Previously described nodular density at the lateral left lung base did not persist, and likely represented a small focus of atelectasis. No new airspace consolidation. Electronically Signed   By: Davina Poke D.O.   On: 10/24/2022 13:05   DG Chest 2 View  Result Date: 10/24/2022 CLINICAL DATA:  Shortness of breath EXAM: CHEST - 2 VIEW COMPARISON:  October 09, 2022 FINDINGS: A nodular density projected over the lateral left  lung base is probably a nipple shadow but nonspecific. The cardiomediastinal silhouette is stable. No pneumothorax. No focal infiltrates. No overt edema. IMPRESSION: 1. A nodular density projected over the lateral left lung base is probably a nipple shadow but is nonspecific. Recommend repeat imaging with nipple markers. 2. No other abnormalities. Electronically Signed   By: Dorise Bullion III M.D.   On: 10/24/2022 11:13    Procedures Procedures    Medications Ordered in ED Medications - No data to display  ED Course/ Medical Decision Making/ A&P                           Medical Decision Making Patient complains of this of breath earlier today.  He reports  he is breathing better now he complains of some swelling in his ankles  Amount and/or Complexity of Data Reviewed Independent Historian: spouse    Details: Patient has family present who is supportive External Data Reviewed: notes.    Details: Cardiology notes reviewed Labs: ordered. Decision-making details documented in ED Course.    Details: Labs ordered reviewed and interpreted patient has a BNP of 124 Radiology: ordered and independent interpretation performed. Decision-making details documented in ED Course.    Details: Chest x-ray shows a small area of atelectasis ECG/medicine tests: independent interpretation performed. Decision-making details documented in ED Course.    Details: EKG shows acute changes  Risk Risk Details: IV Lasix is ordered patient declines administration of IV Lasix because he states that it makes him urinate too much.  Dr. Oswald Hillock emergency physician is in to see and evaluate patient patient is breathing and satting normally.  He has had 2 negative troponins.  Patient is discharged to follow-up with his physician he is advised he needs to take his furosemide at home  Reviewed and interpreted patient has a BNP of 124         Final Clinical Impression(s) / ED Diagnoses Final diagnoses:  Acute  congestive heart failure, unspecified heart failure type ([place])    Rx / DC Orders ED Discharge Orders     None      An After Visit Summary was printed and given to the patient.    Sidney Ace 10/25/22 2120    Tretha Sciara, MD 10/28/22 1500

## 2022-10-31 DIAGNOSIS — W19XXXA Unspecified fall, initial encounter: Secondary | ICD-10-CM | POA: Diagnosis not present

## 2022-10-31 DIAGNOSIS — I959 Hypotension, unspecified: Secondary | ICD-10-CM | POA: Diagnosis not present

## 2022-11-16 DIAGNOSIS — F419 Anxiety disorder, unspecified: Secondary | ICD-10-CM | POA: Diagnosis not present

## 2022-12-18 DIAGNOSIS — F419 Anxiety disorder, unspecified: Secondary | ICD-10-CM | POA: Diagnosis not present

## 2022-12-18 DIAGNOSIS — R609 Edema, unspecified: Secondary | ICD-10-CM | POA: Diagnosis not present

## 2023-01-02 DIAGNOSIS — R1111 Vomiting without nausea: Secondary | ICD-10-CM | POA: Diagnosis not present

## 2023-01-02 DIAGNOSIS — R1310 Dysphagia, unspecified: Secondary | ICD-10-CM | POA: Diagnosis not present

## 2023-01-02 DIAGNOSIS — R110 Nausea: Secondary | ICD-10-CM | POA: Diagnosis not present

## 2023-01-04 ENCOUNTER — Encounter: Payer: Medicare Other | Admitting: Urology

## 2023-01-04 ENCOUNTER — Encounter: Payer: Self-pay | Admitting: Urology

## 2023-01-04 ENCOUNTER — Ambulatory Visit (INDEPENDENT_AMBULATORY_CARE_PROVIDER_SITE_OTHER): Payer: Medicare Other | Admitting: Urology

## 2023-01-04 VITALS — BP 127/66 | HR 80

## 2023-01-04 DIAGNOSIS — N5201 Erectile dysfunction due to arterial insufficiency: Secondary | ICD-10-CM

## 2023-01-04 DIAGNOSIS — D751 Secondary polycythemia: Secondary | ICD-10-CM

## 2023-01-04 DIAGNOSIS — R350 Frequency of micturition: Secondary | ICD-10-CM

## 2023-01-04 DIAGNOSIS — N138 Other obstructive and reflux uropathy: Secondary | ICD-10-CM

## 2023-01-04 DIAGNOSIS — E291 Testicular hypofunction: Secondary | ICD-10-CM

## 2023-01-04 DIAGNOSIS — N401 Enlarged prostate with lower urinary tract symptoms: Secondary | ICD-10-CM

## 2023-01-04 LAB — URINALYSIS, ROUTINE W REFLEX MICROSCOPIC
Bilirubin, UA: NEGATIVE
Glucose, UA: NEGATIVE
Ketones, UA: NEGATIVE
Leukocytes,UA: NEGATIVE
Nitrite, UA: NEGATIVE
RBC, UA: NEGATIVE
Specific Gravity, UA: 1.02 (ref 1.005–1.030)
Urobilinogen, Ur: 0.2 mg/dL (ref 0.2–1.0)
pH, UA: 7 (ref 5.0–7.5)

## 2023-01-04 LAB — MICROSCOPIC EXAMINATION: Bacteria, UA: NONE SEEN

## 2023-01-04 MED ORDER — TESTOSTERONE CYPIONATE 200 MG/ML IM SOLN: INTRAMUSCULAR | 1 refills | Status: DC
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
## 2023-01-05 LAB — HEMOGLOBIN AND HEMATOCRIT, BLOOD
Hematocrit: 45.2 % (ref 37.5–51.0)
Hemoglobin: 15.3 g/dL (ref 13.0–17.7)

## 2023-01-05 LAB — TESTOSTERONE: Testosterone: 791 ng/dL (ref 264–916)

## 2023-01-12 DIAGNOSIS — I10 Essential (primary) hypertension: Secondary | ICD-10-CM | POA: Diagnosis not present

## 2023-01-12 DIAGNOSIS — R609 Edema, unspecified: Secondary | ICD-10-CM | POA: Diagnosis not present

## 2023-01-19 DIAGNOSIS — D696 Thrombocytopenia, unspecified: Secondary | ICD-10-CM | POA: Diagnosis not present

## 2023-01-19 DIAGNOSIS — I2510 Atherosclerotic heart disease of native coronary artery without angina pectoris: Secondary | ICD-10-CM | POA: Diagnosis not present

## 2023-01-19 DIAGNOSIS — I4510 Unspecified right bundle-branch block: Secondary | ICD-10-CM | POA: Diagnosis not present

## 2023-01-19 DIAGNOSIS — I10 Essential (primary) hypertension: Secondary | ICD-10-CM | POA: Diagnosis not present

## 2023-01-19 DIAGNOSIS — E785 Hyperlipidemia, unspecified: Secondary | ICD-10-CM | POA: Diagnosis not present

## 2023-01-19 DIAGNOSIS — I700 Atherosclerosis of aorta: Secondary | ICD-10-CM | POA: Diagnosis not present

## 2023-02-19 DIAGNOSIS — D239 Other benign neoplasm of skin, unspecified: Secondary | ICD-10-CM | POA: Diagnosis not present

## 2023-02-19 DIAGNOSIS — L910 Hypertrophic scar: Secondary | ICD-10-CM | POA: Diagnosis not present

## 2023-02-19 DIAGNOSIS — L570 Actinic keratosis: Secondary | ICD-10-CM | POA: Diagnosis not present

## 2023-02-19 DIAGNOSIS — Z85820 Personal history of malignant melanoma of skin: Secondary | ICD-10-CM | POA: Diagnosis not present

## 2023-02-19 DIAGNOSIS — D485 Neoplasm of uncertain behavior of skin: Secondary | ICD-10-CM | POA: Diagnosis not present

## 2023-03-05 ENCOUNTER — Other Ambulatory Visit: Payer: Self-pay | Admitting: Urology

## 2023-03-22 DIAGNOSIS — E785 Hyperlipidemia, unspecified: Secondary | ICD-10-CM | POA: Diagnosis not present

## 2023-03-22 DIAGNOSIS — I2510 Atherosclerotic heart disease of native coronary artery without angina pectoris: Secondary | ICD-10-CM | POA: Diagnosis not present

## 2023-03-22 DIAGNOSIS — I10 Essential (primary) hypertension: Secondary | ICD-10-CM | POA: Diagnosis not present

## 2023-04-04 DIAGNOSIS — R609 Edema, unspecified: Secondary | ICD-10-CM | POA: Diagnosis not present

## 2023-04-04 DIAGNOSIS — I10 Essential (primary) hypertension: Secondary | ICD-10-CM | POA: Diagnosis not present

## 2023-05-14 DIAGNOSIS — H04123 Dry eye syndrome of bilateral lacrimal glands: Secondary | ICD-10-CM | POA: Diagnosis not present

## 2023-05-21 DIAGNOSIS — R609 Edema, unspecified: Secondary | ICD-10-CM | POA: Diagnosis not present

## 2023-05-21 DIAGNOSIS — I10 Essential (primary) hypertension: Secondary | ICD-10-CM | POA: Diagnosis not present

## 2023-05-24 DIAGNOSIS — H04123 Dry eye syndrome of bilateral lacrimal glands: Secondary | ICD-10-CM | POA: Diagnosis not present

## 2023-06-28 ENCOUNTER — Telehealth: Payer: Self-pay

## 2023-06-28 ENCOUNTER — Other Ambulatory Visit: Payer: Medicare Other

## 2023-06-28 ENCOUNTER — Other Ambulatory Visit: Payer: Self-pay

## 2023-06-28 DIAGNOSIS — D751 Secondary polycythemia: Secondary | ICD-10-CM | POA: Diagnosis not present

## 2023-06-28 DIAGNOSIS — N138 Other obstructive and reflux uropathy: Secondary | ICD-10-CM | POA: Diagnosis not present

## 2023-06-28 DIAGNOSIS — N401 Enlarged prostate with lower urinary tract symptoms: Secondary | ICD-10-CM | POA: Diagnosis not present

## 2023-06-28 DIAGNOSIS — R609 Edema, unspecified: Secondary | ICD-10-CM | POA: Diagnosis not present

## 2023-06-28 DIAGNOSIS — E291 Testicular hypofunction: Secondary | ICD-10-CM

## 2023-06-28 DIAGNOSIS — I10 Essential (primary) hypertension: Secondary | ICD-10-CM | POA: Diagnosis not present

## 2023-06-28 DIAGNOSIS — I2510 Atherosclerotic heart disease of native coronary artery without angina pectoris: Secondary | ICD-10-CM | POA: Diagnosis not present

## 2023-06-28 DIAGNOSIS — Z79899 Other long term (current) drug therapy: Secondary | ICD-10-CM | POA: Diagnosis not present

## 2023-06-28 NOTE — Telephone Encounter (Signed)
Patient requested labs be printed so he could take labs to local [facility]

## 2023-06-29 LAB — HEMOGLOBIN AND HEMATOCRIT, BLOOD
Hematocrit: 47.9 % (ref 37.5–51.0)
Hemoglobin: 16.1 g/dL (ref 13.0–17.7)

## 2023-06-29 LAB — PSA, TOTAL AND FREE
PSA, Free Pct: 28.8 %
PSA, Free: 0.49 ng/mL
Prostate Specific Ag, Serum: 1.7 ng/mL (ref 0.0–4.0)

## 2023-06-29 LAB — TESTOSTERONE: Testosterone: >1500 ng/dL — ABNORMAL HIGH (ref 264–916)

## 2023-07-05 ENCOUNTER — Ambulatory Visit (INDEPENDENT_AMBULATORY_CARE_PROVIDER_SITE_OTHER): Payer: Medicare Other | Admitting: Urology

## 2023-07-05 VITALS — BP 137/72 | HR 71 | Ht 72.0 in | Wt 280.0 lb

## 2023-07-05 DIAGNOSIS — N401 Enlarged prostate with lower urinary tract symptoms: Secondary | ICD-10-CM | POA: Diagnosis not present

## 2023-07-05 DIAGNOSIS — N138 Other obstructive and reflux uropathy: Secondary | ICD-10-CM | POA: Diagnosis not present

## 2023-07-05 DIAGNOSIS — R350 Frequency of micturition: Secondary | ICD-10-CM | POA: Diagnosis not present

## 2023-07-05 DIAGNOSIS — Z862 Personal history of diseases of the blood and blood-forming organs and certain disorders involving the immune mechanism: Secondary | ICD-10-CM

## 2023-07-05 DIAGNOSIS — D751 Secondary polycythemia: Secondary | ICD-10-CM

## 2023-07-05 DIAGNOSIS — E291 Testicular hypofunction: Secondary | ICD-10-CM | POA: Diagnosis not present

## 2023-07-05 DIAGNOSIS — N5201 Erectile dysfunction due to arterial insufficiency: Secondary | ICD-10-CM

## 2023-07-05 LAB — URINALYSIS, ROUTINE W REFLEX MICROSCOPIC
Bilirubin, UA: NEGATIVE
Glucose, UA: NEGATIVE
Ketones, UA: NEGATIVE
Leukocytes,UA: NEGATIVE
Nitrite, UA: NEGATIVE
Protein,UA: NEGATIVE
RBC, UA: NEGATIVE
Specific Gravity, UA: 1.02 (ref 1.005–1.030)
Urobilinogen, Ur: 0.2 mg/dL (ref 0.2–1.0)
pH, UA: 7 (ref 5.0–7.5)

## 2023-07-05 MED ORDER — TESTOSTERONE CYPIONATE 200 MG/ML IM SOLN: INTRAMUSCULAR | 1 refills | Status: DC

## 2023-07-05 MED ORDER — DOXAZOSIN MESYLATE 4 MG PO TABS: ORAL_TABLET | ORAL | 3 refills | Status: DC

## 2023-07-05 NOTE — Progress Notes (Signed)
Subjective:  1. BPH with urinary obstruction   2. 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 Acquired polycythemia   5. Erectile dysfunction due to arterial insufficiency      Mr. Mccredie returns today in f/u.  He has been on TRT with 400mg  IM q2wks.  His T is >1500 and his Hgb is 16.1.   He has a history of secondary polycythemia.  His Hgb was 15.7 in 12/23.    He has had progressive ED and has not been using the VED.  He is able to function with a partial erection.  He ordered some gummies off of the internet that helped.  He has some questions about IPP at last visit.   He remains on doxazosin 4mg  bid for his voiding symptoms.   His IPSS is 8.     His last PSA was 1.7 with a 28% f/t ratio which is stable.       IPSS     Row Name 07/05/23 0900         International Prostate Symptom Score   How often have you had the sensation of not emptying your bladder? Less than 1 in 5     How often have you had to urinate less than every two hours? Less than half the time     How often have you found you stopped and started again several times when you urinated? Less than 1 in 5 times     How often have you found it difficult to postpone urination? Less than half the time     How often have you had a weak urinary stream? Less than 1 in 5 times     How often have you had to strain to start urination? Not at All     How many times did you typically get up at night to urinate? 1 Time     Total IPSS Score 8       Quality of Life due to urinary symptoms   If you were to spend the rest of your life with your urinary condition just the way it is now how would you feel about that? Mixed                   ROS:  Review of Systems  Constitutional:  Positive for malaise/fatigue.  HENT:  Positive for congestion.   Respiratory:  Positive for cough and shortness of breath.   Cardiovascular:  Positive for leg swelling.  Gastrointestinal:  Positive for constipation.  Musculoskeletal:   Positive for joint pain.  Skin:  Positive for itching.  Endo/Heme/Allergies:  Bruises/bleeds easily.  Psychiatric/Behavioral:  The patient is nervous/anxious.     Allergies  Allergen Reactions   Lasix [Furosemide] Nausea Only    Outpatient Encounter Medications as of 07/05/2023  Medication Sig Note   aspirin 81 MG tablet Take 1 tablet (81 mg total) by mouth daily with breakfast. For Heart    atorvastatin (LIPITOR) 10 MG tablet Take 10 mg by mouth daily.    clonazePAM (KLONOPIN) 0.5 MG tablet 0.5 mg at bedtime.  02/24/2015: Received from: External Pharmacy   cyanocobalamin (,VITAMIN B-12,) 1000 MCG/ML injection INJECT AS DIRECTED EVERY 4 WEEKS    torsemide (DEMADEX) 20 MG tablet Take 10 mg by mouth daily.    [DISCONTINUED] doxazosin (CARDURA) 4 MG tablet TAKE 1 TABLET(4 MG) BY MOUTH IN THE MORNING AND AT BEDTIME    [DISCONTINUED] testosterone cypionate (DEPOTESTOSTERONE CYPIONATE) 200 MG/ML injection  ADMINISTER 2 ML(400 MG) IN THE MUSCLE EVERY 14 DAYS    doxazosin (CARDURA) 4 MG tablet TAKE 1 TABLET(4 MG) BY MOUTH IN THE MORNING AND AT BEDTIME    metoprolol succinate (TOPROL XL) 25 MG 24 hr tablet Take 1 tablet (25 mg total) by mouth daily. For Heart    testosterone cypionate (DEPOTESTOSTERONE CYPIONATE) 200 MG/ML injection ADMINISTER 2 ML(400 MG) IN THE MUSCLE EVERY 14 DAYS    [DISCONTINUED] famotidine (PEPCID) 20 MG tablet One after supper (Patient not taking: Reported on 07/05/2023)    No facility-administered encounter medications on file as of 07/05/2023.    Past Medical History:  Diagnosis Date   Arthritis    Asbestosis (HCC)    CAD (coronary artery disease)    Multivessel status post CABG March 2016 - LIMA to LAD, SVG to OM1, SVG to OM2, and SVG to PDA.   Essential hypertension    GERD (gastroesophageal reflux disease)    Melanoma of back (HCC)    Prostatic hypertrophy    Sleep apnea    Intolerant of CPAP    Past Surgical History:  Procedure Laterality Date   ANTERIOR  CERVICAL DECOMP/DISCECTOMY FUSION     CARDIOVERSION N/A 01/28/2015   Procedure: CARDIOVERSION;  Surgeon: Antoine Poche, MD;  Location: [facility];  Service: Endoscopy;  Laterality: N/A;   CHOLECYSTECTOMY N/A 04/07/2020   Procedure: LAPAROSCOPIC CHOLECYSTECTOMY;  Surgeon: Lucretia Roers, MD;  Location: [facility];  Service: General;  Laterality: N/A;   CORONARY ARTERY BYPASS GRAFT N/A 12/29/2014   Procedure: CORONARY ARTERY BYPASS GRAFTING (CABG), ON PUMP, TIMES FOUR, USING LEFT INTERNAL MAMMARY ARTERY, RIGHT GREATER SAPHENOUS VEIN HARVESTED ENDOSCOPICALLY;  Surgeon: Kerin Perna, MD;  Location: [facility] OR;  Service: Open Heart Surgery;  Laterality: N/A;   KNEE ARTHROSCOPY Right    KNEE CARTILAGE SURGERY Left    LEFT HEART CATHETERIZATION WITH CORONARY ANGIOGRAM N/A 12/28/2014   Procedure: LEFT HEART CATHETERIZATION WITH CORONARY ANGIOGRAM;  Surgeon: Runell Gess, MD;  Location: [facility] CATH LAB;  Service: Cardiovascular;  Laterality: N/A;   MELANOMA EXCISION     "back"   SHOULDER OPEN ROTATOR CUFF REPAIR Left    TEE WITHOUT CARDIOVERSION N/A 12/29/2014   Procedure: TRANSESOPHAGEAL ECHOCARDIOGRAM (TEE);  Surgeon: Kerin Perna, MD;  Location: [facility] OR;  Service: Open Heart Surgery;  Laterality: N/A;   TEE WITHOUT CARDIOVERSION N/A 01/28/2015   Procedure: TRANSESOPHAGEAL ECHOCARDIOGRAM (TEE);  Surgeon: Antoine Poche, MD;  Location: [facility];  Service: Endoscopy;  Laterality: N/A;    Social History   Socioeconomic History   Marital status: Married    Spouse name: Not on file   Number of children: 7   Years of education: Not on file   Highest education level: Not on file  Occupational History   Not on file  Tobacco Use   Smoking status: Never   Smokeless tobacco: Never  Vaping Use   Vaping status: Never Used  Substance and Sexual Activity   Alcohol use: Yes    Alcohol/week: 0.0 standard drinks of alcohol    Comment: Occasional   Drug use: No   Sexual activity: Yes    Partners: Female   Other Topics Concern   Not on file  Social History Narrative   Lives at home with wife.  They have seven children.     Social Determinants of Health   Financial Resource Strain: Not on file  Food Insecurity: Not on file  Transportation Needs: Not on file  Physical Activity:  Not on file  Stress: Not on file  Social Connections: Unknown (03/03/2022)   Received from [facility], [facility]   Social Network    Social Network: Not on file  Intimate Partner Violence: Unknown (02/01/2022)   Received from [facility], [facility]   HITS    Physically Hurt: Not on file    Insult or Talk Down To: Not on file    Threaten Physical Harm: Not on file    Scream or Curse: Not on file    Family History  Problem Relation Age of Onset   Stroke Mother    Hypertension Mother    Heart disease Sister        Died from complications of RHD   Heart disease Brother        Heart failure related to EtOH and smoking   Stomach cancer Sister        Objective: Vitals:   07/05/23 0940  BP: 137/72  Pulse: 71      Physical Exam  Recent Results (from the past 2160 hour(s))  Hemoglobin and hematocrit, blood     Status: None   Collection Time: 06/28/23 11:36 AM  Result Value Ref Range   Hemoglobin 16.1 13.0 - 17.7 g/dL   Hematocrit 01.0 27.2 - 51.0 %  Testosterone     Status: Abnormal   Collection Time: 06/28/23 11:37 AM  Result Value Ref Range   Testosterone >1500 (H) 264 - 916 ng/dL    Comment: Adult male reference interval is based on a population of healthy nonobese males (BMI <30) between 53 and 12 years old. Travison, et.al. JCEM [id number]. PMID: 63875643.   PSA, total and free     Status: None   Collection Time: 06/28/23 11:38 AM  Result Value Ref Range   Prostate Specific Ag, Serum 1.7 0.0 - 4.0 ng/mL    Comment: Roche ECLIA methodology. According to the American Urological Association, Serum PSA should decrease and remain at undetectable levels after  radical prostatectomy. The AUA defines biochemical recurrence as an initial PSA value 0.2 ng/mL or greater followed by a subsequent confirmatory PSA value 0.2 ng/mL or greater. Values obtained with different assay methods or kits cannot be used interchangeably. Results cannot be interpreted as absolute evidence of the presence or absence of malignant disease.    PSA, Free 0.49 N/A ng/mL    Comment: Roche ECLIA methodology.   PSA, Free Pct 28.8 %    Comment: The table below lists the probability of prostate cancer for men with non-suspicious DRE results and total PSA between 4 and 10 ng/mL, by patient age Damaris Schooner, JAMA 1998, 329:5188).                   % Free PSA       50-64 yr        65-75 yr                   0.00-10.00%        56%             55%                  10.01-15.00%        24%             35%                  15.01-20.00%        17%  23%                  20.01-25.00%        10%             20%                       >25.00%         5%              9% Please note:  Catalona et al did not make specific               recommendations regarding the use of               percent free PSA for any other population               of men.   Urinalysis, Routine w reflex microscopic     Status: None   Collection Time: 07/05/23  9:41 AM  Result Value Ref Range   Specific Gravity, UA 1.020 1.005 - 1.030   pH, UA 7.0 5.0 - 7.5   Color, UA Yellow Yellow   Appearance Ur Clear Clear   Leukocytes,UA Negative Negative   Protein,UA Negative Negative/Trace   Glucose, UA Negative Negative   Ketones, UA Negative Negative   RBC, UA Negative Negative   Bilirubin, UA Negative Negative   Urobilinogen, Ur 0.2 0.2 - 1.0 mg/dL   Nitrite, UA Negative Negative   Microscopic Examination Comment     Comment: Microscopic not indicated and not performed.   Lab Results  Component Value Date   PSA1 1.7 06/28/2023   UA is clear.     Studies/Results:     Assessment &  Plan: Hypogonadism.  His is doing well on current therapy.  Med refilled.  F/U in 6 months with labs.  Hx of Secondary Polycythemia.  His Hgb remains normal.  BPH with BOO.  He is doing well on doxazosin which I refilled    His prostate is 49ml without a middle lobe.   ED.  He is getting by and had some benefit from an online product.   He doesn't like the VED.  He is not interested in injections but may be interested in an IPP.     Orders Placed This Encounter  Procedures   Urinalysis, Routine w reflex microscopic   Hemoglobin and hematocrit, blood    Standing Status:   Future    Standing Expiration Date:   07/04/2024   Testosterone    Standing Status:   Future    Standing Expiration Date:   07/04/2024    Return in about 6 months (around 01/02/2024) for with labs.   CC: Carylon Perches, MD       Bjorn Pippin 07/06/2023 Patient ID: Ferrel Logan, male   DOB: [birth year], 77 y.o.   MRN: [id number]

## 2023-07-12 ENCOUNTER — Ambulatory Visit: Payer: Medicare Other | Admitting: Urology

## 2023-07-18 DIAGNOSIS — H04123 Dry eye syndrome of bilateral lacrimal glands: Secondary | ICD-10-CM | POA: Diagnosis not present

## 2023-07-18 DIAGNOSIS — H5203 Hypermetropia, bilateral: Secondary | ICD-10-CM | POA: Diagnosis not present

## 2023-07-18 DIAGNOSIS — H2513 Age-related nuclear cataract, bilateral: Secondary | ICD-10-CM | POA: Diagnosis not present

## 2023-07-24 DIAGNOSIS — Z23 Encounter for immunization: Secondary | ICD-10-CM | POA: Diagnosis not present

## 2023-07-24 DIAGNOSIS — I2510 Atherosclerotic heart disease of native coronary artery without angina pectoris: Secondary | ICD-10-CM | POA: Diagnosis not present

## 2023-07-24 DIAGNOSIS — R609 Edema, unspecified: Secondary | ICD-10-CM | POA: Diagnosis not present

## 2023-08-20 DIAGNOSIS — Z79899 Other long term (current) drug therapy: Secondary | ICD-10-CM | POA: Diagnosis not present

## 2023-08-20 DIAGNOSIS — R609 Edema, unspecified: Secondary | ICD-10-CM | POA: Diagnosis not present

## 2023-08-20 DIAGNOSIS — I2510 Atherosclerotic heart disease of native coronary artery without angina pectoris: Secondary | ICD-10-CM | POA: Diagnosis not present

## 2023-08-27 DIAGNOSIS — J61 Pneumoconiosis due to asbestos and other mineral fibers: Secondary | ICD-10-CM | POA: Diagnosis not present

## 2023-08-27 DIAGNOSIS — R609 Edema, unspecified: Secondary | ICD-10-CM | POA: Diagnosis not present

## 2023-08-27 DIAGNOSIS — I700 Atherosclerosis of aorta: Secondary | ICD-10-CM | POA: Diagnosis not present

## 2023-08-28 ENCOUNTER — Encounter: Payer: Self-pay | Admitting: Cardiology

## 2023-08-28 ENCOUNTER — Ambulatory Visit: Payer: Medicare Other | Attending: Cardiology | Admitting: Cardiology

## 2023-08-28 VITALS — BP 132/70 | HR 67 | Ht 72.0 in | Wt 282.0 lb

## 2023-08-28 DIAGNOSIS — I10 Essential (primary) hypertension: Secondary | ICD-10-CM | POA: Diagnosis not present

## 2023-08-28 DIAGNOSIS — E782 Mixed hyperlipidemia: Secondary | ICD-10-CM

## 2023-08-28 DIAGNOSIS — I25119 Atherosclerotic heart disease of native coronary artery with unspecified angina pectoris: Secondary | ICD-10-CM

## 2023-08-28 NOTE — Progress Notes (Signed)
Cardiology Office Note  Date: 08/28/2023   ID: Dantre Scheirer, DOB 11-25-1945, MRN [id number]  History of Present Illness: Devin [name] is a 77 y.o. male last seen in October 2023.  He is here for a routine visit.  Reports no angina or dyspnea beyond NYHA class II.  No palpitations or syncope.  Has been very busy working on his house and building decks.  I reviewed his medications.  Current cardiac regimen includes aspirin, Lipitor, Toprol-XL, and Demadex.  States that diuretics have been controlling his lower leg edema fairly well.  I reviewed his interval lab work, last LDL was 58 in March.  I also reviewed his last ECG.  Physical Exam: VS:  BP 132/70 (BP Location: Right Arm, Patient Position: Sitting, Cuff Size: Normal)   Pulse 67   Ht 6' (1.829 m)   Wt 282 lb (127.9 kg)   SpO2 96%   BMI 38.25 kg/m , BMI Body mass index is 38.25 kg/m.  Wt Readings from Last 3 Encounters:  08/28/23 282 lb (127.9 kg)  07/05/23 280 lb (127 kg)  10/24/22 270 lb (122.5 kg)    General: Patient appears comfortable at rest. HEENT: Conjunctiva and lids normal. Neck: Supple, no elevated JVP or carotid bruits. Lungs: Clear to auscultation, nonlabored breathing at rest. Cardiac: Regular rate and rhythm, no S3 or significant systolic murmur. Extremities: No pitting edema.  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  Labwork: 10/24/2022: B Natriuretic Peptide 124.0; BUN 22; Creatinine, Ser 1.20; Platelets 173; Potassium 5.7; Sodium 134 06/28/2023: Hemoglobin 16.29 December 2022: Cholesterol 107, triglycerides 76, HDL 33, LDL 58 August 2024: Hemoglobin 16.31 July 2023: BUN 19, creatinine 1.08, potassium 5  Other Studies Reviewed Today:  No interval cardiac testing for review today.  Assessment and Plan:  1.  Multivessel CAD status post CABG in March 2016 including LIMA  to LAD, SVG to OM1, SVG to OM 2, and SVG to PDA.  LVEF 55 to 60% by echocardiogram in June 2021.  He reports no active angina and stable NYHA class II dyspnea.  Continue aspirin, Lipitor, and Toprol-XL.  2.  Primary hypertension.  Blood pressure control is reasonable today.  Continue Toprol-XL.  He uses Demadex for control of lower leg edema.  3.  Mixed hyperlipidemia.  LDL 58 in March of this year.  Continue Lipitor.  4.  OSA, intolerant of CPAP.  Disposition:  Follow up  1 year, sooner if needed.  Signed, Jonelle Sidle, M.D., F.A.C.C. [facility]

## 2023-08-28 NOTE — Patient Instructions (Signed)
Medication Instructions:  Your physician recommends that you continue on your current medications as directed. Please refer to the Current Medication list given to you today.   Labwork: None today  Testing/Procedures: None today  Follow-Up: 1 year Dr.McDowell  Any Other Special Instructions Will Be Listed Below (If Applicable).  If you need a refill on your cardiac medications before your next appointment, please call your pharmacy.

## 2023-10-22 DIAGNOSIS — G4733 Obstructive sleep apnea (adult) (pediatric): Secondary | ICD-10-CM | POA: Diagnosis not present

## 2023-10-22 DIAGNOSIS — R609 Edema, unspecified: Secondary | ICD-10-CM | POA: Diagnosis not present

## 2023-10-22 DIAGNOSIS — Z77090 Contact with and (suspected) exposure to asbestos: Secondary | ICD-10-CM | POA: Diagnosis not present

## 2023-10-22 DIAGNOSIS — I10 Essential (primary) hypertension: Secondary | ICD-10-CM | POA: Diagnosis not present

## 2023-10-22 DIAGNOSIS — I700 Atherosclerosis of aorta: Secondary | ICD-10-CM | POA: Diagnosis not present

## 2023-10-22 DIAGNOSIS — I2510 Atherosclerotic heart disease of native coronary artery without angina pectoris: Secondary | ICD-10-CM | POA: Diagnosis not present

## 2023-10-22 DIAGNOSIS — Z79899 Other long term (current) drug therapy: Secondary | ICD-10-CM | POA: Diagnosis not present

## 2023-10-29 DIAGNOSIS — R609 Edema, unspecified: Secondary | ICD-10-CM | POA: Diagnosis not present

## 2023-10-29 DIAGNOSIS — I2510 Atherosclerotic heart disease of native coronary artery without angina pectoris: Secondary | ICD-10-CM | POA: Diagnosis not present

## 2023-10-29 DIAGNOSIS — I10 Essential (primary) hypertension: Secondary | ICD-10-CM | POA: Diagnosis not present

## 2024-01-03 ENCOUNTER — Other Ambulatory Visit: Payer: Medicare Other

## 2024-01-03 DIAGNOSIS — D751 Secondary polycythemia: Secondary | ICD-10-CM | POA: Diagnosis not present

## 2024-01-03 DIAGNOSIS — E291 Testicular hypofunction: Secondary | ICD-10-CM | POA: Diagnosis not present

## 2024-01-04 LAB — HEMOGLOBIN AND HEMATOCRIT, BLOOD
Hematocrit: 48.5 % (ref 37.5–51.0)
Hemoglobin: 16 g/dL (ref 13.0–17.7)

## 2024-01-04 LAB — TESTOSTERONE: Testosterone: 251 ng/dL — ABNORMAL LOW (ref 264–916)

## 2024-01-10 ENCOUNTER — Ambulatory Visit: Payer: Medicare Other | Admitting: Urology

## 2024-01-15 DIAGNOSIS — D510 Vitamin B12 deficiency anemia due to intrinsic factor deficiency: Secondary | ICD-10-CM | POA: Diagnosis not present

## 2024-01-15 DIAGNOSIS — D696 Thrombocytopenia, unspecified: Secondary | ICD-10-CM | POA: Diagnosis not present

## 2024-01-15 DIAGNOSIS — I2510 Atherosclerotic heart disease of native coronary artery without angina pectoris: Secondary | ICD-10-CM | POA: Diagnosis not present

## 2024-01-15 DIAGNOSIS — N400 Enlarged prostate without lower urinary tract symptoms: Secondary | ICD-10-CM | POA: Diagnosis not present

## 2024-01-15 DIAGNOSIS — R609 Edema, unspecified: Secondary | ICD-10-CM | POA: Diagnosis not present

## 2024-01-15 DIAGNOSIS — J61 Pneumoconiosis due to asbestos and other mineral fibers: Secondary | ICD-10-CM | POA: Diagnosis not present

## 2024-01-15 DIAGNOSIS — G4733 Obstructive sleep apnea (adult) (pediatric): Secondary | ICD-10-CM | POA: Diagnosis not present

## 2024-01-15 DIAGNOSIS — I700 Atherosclerosis of aorta: Secondary | ICD-10-CM | POA: Diagnosis not present

## 2024-01-15 DIAGNOSIS — E785 Hyperlipidemia, unspecified: Secondary | ICD-10-CM | POA: Diagnosis not present

## 2024-01-15 DIAGNOSIS — I10 Essential (primary) hypertension: Secondary | ICD-10-CM | POA: Diagnosis not present

## 2024-01-15 DIAGNOSIS — Z79899 Other long term (current) drug therapy: Secondary | ICD-10-CM | POA: Diagnosis not present

## 2024-01-22 DIAGNOSIS — I4510 Unspecified right bundle-branch block: Secondary | ICD-10-CM | POA: Diagnosis not present

## 2024-01-22 DIAGNOSIS — I10 Essential (primary) hypertension: Secondary | ICD-10-CM | POA: Diagnosis not present

## 2024-01-22 DIAGNOSIS — J61 Pneumoconiosis due to asbestos and other mineral fibers: Secondary | ICD-10-CM | POA: Diagnosis not present

## 2024-01-22 DIAGNOSIS — E785 Hyperlipidemia, unspecified: Secondary | ICD-10-CM | POA: Diagnosis not present

## 2024-01-22 DIAGNOSIS — R079 Chest pain, unspecified: Secondary | ICD-10-CM | POA: Diagnosis not present

## 2024-01-22 DIAGNOSIS — I2510 Atherosclerotic heart disease of native coronary artery without angina pectoris: Secondary | ICD-10-CM | POA: Diagnosis not present

## 2024-01-22 DIAGNOSIS — R609 Edema, unspecified: Secondary | ICD-10-CM | POA: Diagnosis not present

## 2024-01-24 ENCOUNTER — Encounter: Payer: Self-pay | Admitting: Internal Medicine

## 2024-01-24 ENCOUNTER — Ambulatory Visit: Payer: Medicare Other | Admitting: Urology

## 2024-01-24 VITALS — BP 147/69 | HR 67

## 2024-01-24 DIAGNOSIS — D751 Secondary polycythemia: Secondary | ICD-10-CM

## 2024-01-24 DIAGNOSIS — E291 Testicular hypofunction: Secondary | ICD-10-CM

## 2024-01-24 DIAGNOSIS — N5201 Erectile dysfunction due to arterial insufficiency: Secondary | ICD-10-CM | POA: Diagnosis not present

## 2024-01-24 DIAGNOSIS — N138 Other obstructive and reflux uropathy: Secondary | ICD-10-CM | POA: Diagnosis not present

## 2024-01-24 DIAGNOSIS — R350 Frequency of micturition: Secondary | ICD-10-CM | POA: Diagnosis not present

## 2024-01-24 DIAGNOSIS — N401 Enlarged prostate with lower urinary tract symptoms: Secondary | ICD-10-CM

## 2024-01-24 LAB — URINALYSIS, ROUTINE W REFLEX MICROSCOPIC
Bilirubin, UA: NEGATIVE
Glucose, UA: NEGATIVE
Ketones, UA: NEGATIVE
Leukocytes,UA: NEGATIVE
Nitrite, UA: NEGATIVE
Protein,UA: NEGATIVE
RBC, UA: NEGATIVE
Specific Gravity, UA: 1.02 (ref 1.005–1.030)
Urobilinogen, Ur: 0.2 mg/dL (ref 0.2–1.0)
pH, UA: 6 (ref 5.0–7.5)

## 2024-01-24 MED ORDER — TESTOSTERONE CYPIONATE 200 MG/ML IM SOLN: INTRAMUSCULAR | 1 refills | Status: DC

## 2024-01-24 MED ORDER — TADALAFIL 20 MG PO TABS: 20.0000 mg | ORAL_TABLET | Freq: Every day | ORAL | 11 refills | Status: AC | PRN

## 2024-01-24 NOTE — Progress Notes (Signed)
 Bladder Scan completed today.  Patient can void prior to the bladder scan. Bladder scan result: 126  Performed By: Alfonse Spruce. CMA  Additional notes-

## 2024-01-24 NOTE — Progress Notes (Unsigned)
 Subjective:  1. BPH with urinary obstruction   2. 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 Acquired polycythemia   5. Erectile dysfunction due to arterial insufficiency      Devin [name] returns today in f/u.  He has been on TRT with 400mg  IM q2wks.  His T is 251 and his Hgb is 16.0 which is stable.   He has a history of secondary polycythemia.   He has had progressive ED and has not been using the VED.  He is able to function with a partial erection.  He ordered some gummies off of the internet that helped.  He has some questions about IPP at last visit.   He remains on doxazosin 4mg  bid for his voiding symptoms.   His IPSS is 8.   He has nocturia x 1.   His last PSA on 06/28/23 was 1.7 with a 28% f/t ratio which is stable.    He is on torsemide for edema but he is not getting a response.      IPSS     Row Name 01/24/24 1100         International Prostate Symptom Score   How often have you had the sensation of not emptying your bladder? Less than 1 in 5     How often have you had to urinate less than every two hours? About half the time     How often have you found you stopped and started again several times when you urinated? Less than 1 in 5 times     How often have you found it difficult to postpone urination? Less than half the time     How often have you had a weak urinary stream? Not at All     How often have you had to strain to start urination? Not at All     How many times did you typically get up at night to urinate? 1 Time     Total IPSS Score 8       Quality of Life due to urinary symptoms   If you were to spend the rest of your life with your urinary condition just the way it is now how would you feel about that? Mixed                    ROS:  Review of Systems  Constitutional:  Positive for malaise/fatigue.  Respiratory:  Positive for shortness of breath.   Cardiovascular:  Positive for leg swelling.  Musculoskeletal:  Positive for joint  pain.  Skin:  Positive for itching.    Allergies  Allergen Reactions   Lasix [Furosemide] Nausea Only    Outpatient Encounter Medications as of 01/24/2024  Medication Sig Note   aspirin 81 MG tablet Take 1 tablet (81 mg total) by mouth daily with breakfast. For Heart    atorvastatin (LIPITOR) 10 MG tablet Take 10 mg by mouth daily.    clonazePAM (KLONOPIN) 0.5 MG tablet 0.5 mg at bedtime.  02/24/2015: Received from: External Pharmacy   cyanocobalamin (,VITAMIN B-12,) 1000 MCG/ML injection INJECT AS DIRECTED EVERY 4 WEEKS    doxazosin (CARDURA) 4 MG tablet TAKE 1 TABLET(4 MG) BY MOUTH IN THE MORNING AND AT BEDTIME    tadalafil (CIALIS) 20 MG tablet Take 1 tablet (20 mg total) by mouth daily as needed for erectile dysfunction.    torsemide (DEMADEX) 20 MG tablet Take 10 mg by mouth daily.    [DISCONTINUED]  testosterone cypionate (DEPOTESTOSTERONE CYPIONATE) 200 MG/ML injection ADMINISTER 2 ML(400 MG) IN THE MUSCLE EVERY 14 DAYS    metoprolol succinate (TOPROL XL) 25 MG 24 hr tablet Take 1 tablet (25 mg total) by mouth daily. For Heart    testosterone cypionate (DEPOTESTOSTERONE CYPIONATE) 200 MG/ML injection ADMINISTER 2 ML(400 MG) IN THE MUSCLE EVERY 14 DAYS    No facility-administered encounter medications on file as of 01/24/2024.    Past Medical History:  Diagnosis Date   Arthritis    Asbestosis (HCC)    CAD (coronary artery disease)    Multivessel status post CABG March 2016 - LIMA to LAD, SVG to OM1, SVG to OM2, and SVG to PDA.   Essential hypertension    GERD (gastroesophageal reflux disease)    Melanoma of back (HCC)    Prostatic hypertrophy    Sleep apnea    Intolerant of CPAP    Past Surgical History:  Procedure Laterality Date   ANTERIOR CERVICAL DECOMP/DISCECTOMY FUSION     CARDIOVERSION N/A 01/28/2015   Procedure: CARDIOVERSION;  Surgeon: Antoine Poche, MD;  Location: [facility];  Service: Endoscopy;  Laterality: N/A;   CHOLECYSTECTOMY N/A 04/07/2020   Procedure:  LAPAROSCOPIC CHOLECYSTECTOMY;  Surgeon: Lucretia Roers, MD;  Location: [facility];  Service: General;  Laterality: N/A;   CORONARY ARTERY BYPASS GRAFT N/A 12/29/2014   Procedure: CORONARY ARTERY BYPASS GRAFTING (CABG), ON PUMP, TIMES FOUR, USING LEFT INTERNAL MAMMARY ARTERY, RIGHT GREATER SAPHENOUS VEIN HARVESTED ENDOSCOPICALLY;  Surgeon: Kerin Perna, MD;  Location: [facility] OR;  Service: Open Heart Surgery;  Laterality: N/A;   KNEE ARTHROSCOPY Right    KNEE CARTILAGE SURGERY Left    LEFT HEART CATHETERIZATION WITH CORONARY ANGIOGRAM N/A 12/28/2014   Procedure: LEFT HEART CATHETERIZATION WITH CORONARY ANGIOGRAM;  Surgeon: Runell Gess, MD;  Location: [facility] CATH LAB;  Service: Cardiovascular;  Laterality: N/A;   MELANOMA EXCISION     "back"   SHOULDER OPEN ROTATOR CUFF REPAIR Left    TEE WITHOUT CARDIOVERSION N/A 12/29/2014   Procedure: TRANSESOPHAGEAL ECHOCARDIOGRAM (TEE);  Surgeon: Kerin Perna, MD;  Location: [facility] OR;  Service: Open Heart Surgery;  Laterality: N/A;   TEE WITHOUT CARDIOVERSION N/A 01/28/2015   Procedure: TRANSESOPHAGEAL ECHOCARDIOGRAM (TEE);  Surgeon: Antoine Poche, MD;  Location: [facility];  Service: Endoscopy;  Laterality: N/A;    Social History   Socioeconomic History   Marital status: Married    Spouse name: Not on file   Number of children: 7   Years of education: Not on file   Highest education level: Not on file  Occupational History   Not on file  Tobacco Use   Smoking status: Never   Smokeless tobacco: Never  Vaping Use   Vaping status: Never Used  Substance and Sexual Activity   Alcohol use: Yes    Alcohol/week: 0.0 standard drinks of alcohol    Comment: Occasional   Drug use: No   Sexual activity: Yes    Partners: Female  Other Topics Concern   Not on file  Social History Narrative   Lives at home with wife.  They have seven children.     Social Drivers of Corporate investment banker Strain: Not on file  Food Insecurity: Not on file   Transportation Needs: Not on file  Physical Activity: Not on file  Stress: Not on file  Social Connections: Unknown (03/03/2022)   Received from [facility]   Social Network    Social Network: Not  on file  Intimate Partner Violence: Unknown (02/01/2022)   Received from [facility]   HITS    Physically Hurt: Not on file    Insult or Talk Down To: Not on file    Threaten Physical Harm: Not on file    Scream or Curse: Not on file    Family History  Problem Relation Age of Onset   Stroke Mother    Hypertension Mother    Heart disease Sister        Died from complications of RHD   Heart disease Brother        Heart failure related to EtOH and smoking   Stomach cancer Sister        Objective: Vitals:   01/24/24 1128 01/24/24 1130  BP: (!) 163/70 (!) 147/69  Pulse: 79 67      Physical Exam  Recent Results (from the past 2160 hours)  Hemoglobin and hematocrit, blood     Status: None   Collection Time: 01/03/24  9:05 AM  Result Value Ref Range   Hemoglobin 16.0 13.0 - 17.7 g/dL   Hematocrit 82.9 56.2 - 51.0 %  Testosterone     Status: Abnormal   Collection Time: 01/03/24  9:05 AM  Result Value Ref Range   Testosterone 251 (L) 264 - 916 ng/dL    Comment: Adult male reference interval is based on a population of healthy nonobese males (BMI <30) between 62 and 30 years old. Travison, et.al. JCEM [phone]. PMID: [id number].   Urinalysis, Routine w reflex microscopic     Status: None   Collection Time: 01/24/24 11:30 AM  Result Value Ref Range   Specific Gravity, UA 1.020 1.005 - 1.030   pH, UA 6.0 5.0 - 7.5   Color, UA Yellow Yellow   Appearance Ur Clear Clear   Leukocytes,UA Negative Negative   Protein,UA Negative Negative/Trace   Glucose, UA Negative Negative   Ketones, UA Negative Negative   RBC, UA Negative Negative   Bilirubin, UA Negative Negative   Urobilinogen, Ur 0.2 0.2 - 1.0 mg/dL   Nitrite, UA Negative  Negative   Microscopic Examination Comment     Comment: Microscopic not indicated and not performed.   Lab Results  Component Value Date   PSA1 1.7 06/28/2023   UA is clear.     Studies/Results:     Assessment & Plan: Hypogonadism.  His is doing well on current therapy.  Med refilled.  F/U in 6 months with labs.  Hx of Secondary Polycythemia.  His Hgb remains normal.  BPH with BOO.  He is doing well on doxazosin.    His prostate is 49ml without a middle lobe.   ED.  He would like to try tadalafil again.  I have given him a script.  I discussed the side effects.  I also mentioned Eroxon.    Orders Placed This Encounter  Procedures   Urinalysis, Routine w reflex microscopic   PSA, total and free    Standing Status:   Future    Expected Date:   07/26/2024    Expiration Date:   01/23/2025   Testosterone    Standing Status:   Future    Expected Date:   07/26/2024    Expiration Date:   01/23/2025   Hemoglobin and hematocrit, blood    Standing Status:   Future    Expected Date:   07/26/2024    Expiration Date:   01/23/2025   BLADDER SCAN AMB NON-IMAGING  Return in about 6 months (around 07/26/2024) for Fu with Dr. Retta Diones or Dr. Mena Goes in 6 months with labs. .   CC: Carylon Perches, MD       Devin [name] 01/25/2024 Patient ID: Devin [name], male   DOB: 01/12/1946, 78 y.o.   MRN: [id number] Patient ID: Devin [name], male   DOB: Mar 17, 1946, 78 y.o.   MRN: [id number]

## 2024-01-25 ENCOUNTER — Encounter: Payer: Self-pay | Admitting: Urology

## 2024-02-19 DIAGNOSIS — Z85820 Personal history of malignant melanoma of skin: Secondary | ICD-10-CM | POA: Diagnosis not present

## 2024-02-19 DIAGNOSIS — L821 Other seborrheic keratosis: Secondary | ICD-10-CM | POA: Diagnosis not present

## 2024-02-19 DIAGNOSIS — L570 Actinic keratosis: Secondary | ICD-10-CM | POA: Diagnosis not present

## 2024-02-19 DIAGNOSIS — D225 Melanocytic nevi of trunk: Secondary | ICD-10-CM | POA: Diagnosis not present

## 2024-02-19 DIAGNOSIS — D485 Neoplasm of uncertain behavior of skin: Secondary | ICD-10-CM | POA: Diagnosis not present

## 2024-02-21 ENCOUNTER — Ambulatory Visit (INDEPENDENT_AMBULATORY_CARE_PROVIDER_SITE_OTHER)

## 2024-02-21 ENCOUNTER — Encounter: Payer: Self-pay | Admitting: Podiatry

## 2024-02-21 ENCOUNTER — Ambulatory Visit (INDEPENDENT_AMBULATORY_CARE_PROVIDER_SITE_OTHER): Admitting: Podiatry

## 2024-02-21 DIAGNOSIS — M7752 Other enthesopathy of left foot: Secondary | ICD-10-CM | POA: Diagnosis not present

## 2024-02-21 DIAGNOSIS — M76822 Posterior tibial tendinitis, left leg: Secondary | ICD-10-CM

## 2024-02-21 DIAGNOSIS — M778 Other enthesopathies, not elsewhere classified: Secondary | ICD-10-CM

## 2024-02-21 DIAGNOSIS — M66872 Spontaneous rupture of other tendons, left ankle and foot: Secondary | ICD-10-CM | POA: Diagnosis not present

## 2024-02-21 MED ORDER — METHYLPREDNISOLONE 4 MG PO TBPK: ORAL_TABLET | ORAL | 0 refills | Status: DC

## 2024-02-21 MED ORDER — TRIAMCINOLONE ACETONIDE 40 MG/ML IJ SUSP: 20.0000 mg | Freq: Once | INTRAMUSCULAR | Status: AC
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
## 2024-02-28 NOTE — Progress Notes (Signed)
 Duplicate visit created- patient original no show and then readded to schedule.

## 2024-02-29 ENCOUNTER — Telehealth: Payer: Self-pay | Admitting: *Deleted

## 2024-02-29 NOTE — Telephone Encounter (Signed)
 Pt walked in the office and c/o swelling to his legs and feet and SOB that stated 6 mons ago. Pt reports that his swelling and SOB has gotten worse recently, but he is unsure of the timeframe. His weight he reports is normally 260-270 lbs. And he weighed today and was 296 lbs. He takes Torsemide 10 - 20 mg daily depending on the swelling. Pt does elevate legs at times but complains that it makes his knees hurt. Pt reports that his BP is elevated today it was 158/69 and 144/70 hr 70.  Appt made for 5/8 with B.Strader. Please advise.

## 2024-02-29 NOTE — Telephone Encounter (Signed)
 Called pt with response. No answer. Voicemail is full.

## 2024-02-29 NOTE — Telephone Encounter (Signed)
     Unclear if baseline weight is 260-270 lbs as he was at 282 lbs when he saw Dr. Londa Rival in 07/2023. Still above baseline though and given his symptoms, would recommend he take at minimum Torsemide 20mg  daily until his visit next week. Based off volume status, may need to further titrate at that time. Limit sodium intake and keep fluid intake to less than 2 L daily.   Signed, Dorma Gash, PA-C 02/29/2024, 1:03 PM Pager: [phone]

## 2024-03-06 ENCOUNTER — Encounter: Payer: Self-pay | Admitting: Physician Assistant

## 2024-03-06 ENCOUNTER — Ambulatory Visit: Attending: Student | Admitting: Physician Assistant

## 2024-03-06 VITALS — BP 126/70 | HR 72 | Ht 71.0 in | Wt 304.0 lb

## 2024-03-06 DIAGNOSIS — I10 Essential (primary) hypertension: Secondary | ICD-10-CM | POA: Diagnosis not present

## 2024-03-06 DIAGNOSIS — E782 Mixed hyperlipidemia: Secondary | ICD-10-CM | POA: Insufficient documentation

## 2024-03-06 DIAGNOSIS — I519 Heart disease, unspecified: Secondary | ICD-10-CM | POA: Insufficient documentation

## 2024-03-06 DIAGNOSIS — R600 Localized edema: Secondary | ICD-10-CM | POA: Insufficient documentation

## 2024-03-06 DIAGNOSIS — G4733 Obstructive sleep apnea (adult) (pediatric): Secondary | ICD-10-CM | POA: Insufficient documentation

## 2024-03-06 DIAGNOSIS — I25709 Atherosclerosis of coronary artery bypass graft(s), unspecified, with unspecified angina pectoris: Secondary | ICD-10-CM | POA: Insufficient documentation

## 2024-03-06 DIAGNOSIS — R0602 Shortness of breath: Secondary | ICD-10-CM | POA: Diagnosis not present

## 2024-03-06 DIAGNOSIS — I5033 Acute on chronic diastolic (congestive) heart failure: Secondary | ICD-10-CM | POA: Diagnosis not present

## 2024-03-06 MED ORDER — TORSEMIDE 20 MG PO TABS: 40.0000 mg | ORAL_TABLET | Freq: Every day | ORAL | 11 refills | Status: AC

## 2024-03-06 NOTE — Progress Notes (Signed)
 Cardiology Office Note:  .   Date:  03/06/2024  ID:  Devin [name], DOB 18-Sep-1946, MRN [id number] PCP: Artemisa Bile, MD  [facility] Providers Cardiologist:  Teddie Favre, MD {  History of Present Illness: .   Devin [name] is a 78 y.o. male with PMH of multivessel CAD (s/p CABG in 12/2014 including LIMA to LAD, SVG to OM1, SVG to OM2 and SVG to PDA), HFpEF (2021: 55-60%), HTN, HLD, OSA intolerant to CPAP, Asbestosis, present to OV for evaluation of SOB, swelling and weight gain.   Last seen in OV 07/2023 with Dr. Londa Rival for routine visit. At that time, no cardiac symptoms.  Noted improvement in LE edema with diuretics.  Continued on ASA 81 mg daily, Lipitor  10 mg daily, Toprol  XL 25 mg daily and torsemide 10 mg daily.   Patient walked into office on 02/29/2024 with concerns about LE edema, worsening SOB, weight gain and elevated BP. At that time, taking 10-20 mg daily of torsemide.  BP was 158/69 then repeat 144/70 with HR of 70.  Woodfin Hays, PA-C recommended taking Torsemide 20 mg daily until next visit, limit Na and fluid intake.   He did not notice any change with 20 mg Torsemide daily. Tried taking Torsemide 20 mg in am and pm yesterday but did not see any difference in edema or urine output. Noted worsening SOB with minimal exertion for about 1 year ago. Noted worsening LE edema x 1 m/o. Reported productive chronic cough x 1 year with clear or dark colored sputum but relates to asbestosis hx. Feel like he is drowning when laying supine. Able to sleep on R side with one pillow. Reported baseline weight at 259 to 270 lbs (last OV 07/2023 wt 282 vs today 304). Reported constant burning CP since CABG, 4/10 or 8/10, and has not worsening over this time, sometime relief with ASA or Tums. Endorsed high sodium diet. Reported daily fluid intake of x2 20 oz water, 1 L of soda, 1 cup of decaf coffee. Denies binge drinking, tobacco, or drugs. Can do yard work if riding Oncologist. Activity limited due to SOB. No recent sickness.   Studies Reviewed: [name]   ECHO IMPRESSIONS 03/2020  1. Left ventricular ejection fraction, by estimation, is 55 to 60%. The  left ventricle has normal function. The left ventricle has no regional  wall motion abnormalities. There is mild left ventricular hypertrophy.  Left ventricular diastolic parameters  are consistent with Grade II diastolic dysfunction (pseudonormalization).  There is the interventricular septum is flattened in diastole ('D' shaped  left ventricle), consistent with right ventricular volume overload.   2. Right ventricular systolic function is mildly to moderately reduced.  The right ventricular size is moderately enlarged. There is normal  pulmonary artery systolic pressure. The estimated right ventricular  systolic pressure is 29.8 mmHg.   3. Left atrial size was mildly dilated.   4. Right atrial size was mildly dilated.   5. The mitral valve is abnormal, mildly thickened and calcified with  noderate to severe annular calcification. Trivial mitral valve  regurgitation.   6. The aortic valve is tricuspid. Aortic valve regurgitation is trivial.   7. The inferior vena cava is normal in size with greater than 50%  respiratory variability, suggesting right atrial pressure of 3 mmHg.   Physical Exam:   VS:  BP 126/70 (BP Location: Left Arm, Cuff Size: Large)   Pulse 72   Ht 5\' 11"  (1.803 m)  Wt (!) 304 lb (137.9 kg)   SpO2 93%   BMI 42.40 kg/m    Wt Readings from Last 3 Encounters:  03/06/24 (!) 304 lb (137.9 kg)  08/28/23 282 lb (127.9 kg)  07/05/23 280 lb (127 kg)    GEN: Sitting in chair, in no acute distress NECK: + JVD; No carotid bruits CARDIAC: RRR, no murmurs, rubs, gallops RESPIRATORY:  Diminished breath sound in base (R>>L) without rales, wheezing or rhonchi  ABDOMEN: Soft, non-tender, distended EXTREMITIES:  2+ bilateral pitting edema; No deformity   ASSESSMENT AND PLAN: .   Acute on  Chronic HFpEF  RV dysfunction DOE  Bilateral LE edema  ECHO 2021: LVEF 55-60%, mild LVH, G2DD, RV overload, RV function mild to moderately reduced, RV moderately enlarged, mildly dilated L/R atria, Trivial AVR.  Chronic worsening SOB with minimal exertion x 1 yr. Worsening bilateral LE edema x 1 m/o. Volume overloaded on exam.  Ordered ECHO & CXR  Increasing Torsemide to 40 mg daily, Ordered BMP and BNP in 1 week.    Encourage low sodium diet.   CAD HLD multivessel CAD (s/p CABG in 12/2014 including LIMA to LAD, SVG to OM1, SVG to OM2 and SVG to PDA) 12/2023 LDL 79 Reported chronic burning CP since CABG that has not worsening over time. Sometime relieved by ASA or TUMS.  Less concern for ischemic cause with atypical CP and EKG without ischemic changes. Can readdress based on ECHO results.  Continue on daily dose of ASA 81 mg, Lipitor  10 mg and Toprol -XL 25 mg  HTN  BP today: 126/70 Continue Toprol  XL as above and doxazosin  4 mg BID.   OSA intolerant of CPAP Still not interested in using CPAP  Dispo: increased torsemide 40 mg daily, BMP & BNP in 1 week, CXR, ECHO,  Signed, [name], PA-C

## 2024-03-06 NOTE — Telephone Encounter (Signed)
 Visit on 03/06/24- will ask provider to address.

## 2024-03-06 NOTE — Patient Instructions (Signed)
 Medication Instructions:  Your physician has recommended you make the following change in your medication:   Increase Torsemide to 40 mg Daily   *If you need a refill on your cardiac medications before your next appointment, please call your pharmacy*  Lab Work: Your physician recommends that you return for lab work in 1 Week. (BNP, BMET)   If you have labs (blood work) drawn today and your tests are completely normal, you will receive your results only by: MyChart Message (if you have MyChart) OR A paper copy in the mail If you have any lab test that is abnormal or we need to change your treatment, we will call you to review the results.  Testing/Procedures: Your physician has requested that you have an echocardiogram. Echocardiography is a painless test that uses sound waves to create images of your heart. It provides your doctor with information about the size and shape of your heart and how well your heart's chambers and valves are working. This procedure takes approximately one hour. There are no restrictions for this procedure. Please do NOT wear cologne, perfume, aftershave, or lotions (deodorant is allowed). Please arrive 15 minutes prior to your appointment time.  Please note: We ask at that you not bring children with you during ultrasound (echo/ vascular) testing. Due to room size and safety concerns, children are not allowed in the ultrasound rooms during exams. Our front office staff cannot provide observation of children in our lobby area while testing is being conducted. An adult accompanying a patient to their appointment will only be allowed in the ultrasound room at the discretion of the ultrasound technician under special circumstances. We apologize for any inconvenience.  A chest x-ray takes a picture of the organs and structures inside the chest, including the heart, lungs, and blood vessels. This test can show several things, including, whether the heart is enlarges;  whether fluid is building up in the lungs; and whether pacemaker / defibrillator leads are still in place.   Follow-Up: At [facility], you and your health needs are our priority.  As part of our continuing mission to provide you with exceptional heart care, our providers are all part of one team.  This team includes your primary Cardiologist (physician) and Advanced Practice Providers or APPs (Physician Assistants and Nurse Practitioners) who all work together to provide you with the care you need, when you need it.  Your next appointment:   2 -3 month(s)  Provider:   You may see Teddie Favre, MD or one of the following Advanced Practice Providers on your designated Care Team:   Woodfin Hays, PA-C  [place], [place] Theotis Flake, [place]     We recommend signing up for the patient portal called "MyChart".  Sign up information is provided on this After Visit Summary.  MyChart is used to connect with patients for Virtual Visits (Telemedicine).  Patients are able to view lab/test results, encounter notes, upcoming appointments, etc.  Non-urgent messages can be sent to your provider as well.   To learn more about what you can do with MyChart, go to [email or web address].   Other Instructions Thank you for choosing  [facility]!

## 2024-03-11 ENCOUNTER — Ambulatory Visit (HOSPITAL_COMMUNITY)
Admission: RE | Admit: 2024-03-11 | Discharge: 2024-03-11 | Disposition: A | Discharge status: Home / Self Care | Source: Ambulatory Visit | Attending: Physician Assistant | Admitting: Physician Assistant

## 2024-03-11 DIAGNOSIS — J9811 Atelectasis: Secondary | ICD-10-CM | POA: Diagnosis not present

## 2024-03-11 DIAGNOSIS — R0602 Shortness of breath: Secondary | ICD-10-CM | POA: Diagnosis not present

## 2024-03-11 DIAGNOSIS — I509 Heart failure, unspecified: Secondary | ICD-10-CM | POA: Diagnosis not present

## 2024-03-12 ENCOUNTER — Ambulatory Visit: Payer: Self-pay | Admitting: Physician Assistant

## 2024-03-18 DIAGNOSIS — R600 Localized edema: Secondary | ICD-10-CM | POA: Diagnosis not present

## 2024-03-18 DIAGNOSIS — I10 Essential (primary) hypertension: Secondary | ICD-10-CM | POA: Diagnosis not present

## 2024-03-18 DIAGNOSIS — R0602 Shortness of breath: Secondary | ICD-10-CM | POA: Diagnosis not present

## 2024-03-19 LAB — BASIC METABOLIC PANEL WITH GFR
BUN/Creatinine Ratio: 14 (ref 10–24)
BUN: 19 mg/dL (ref 8–27)
CO2: 27 mmol/L (ref 20–29)
Calcium: 9.5 mg/dL (ref 8.6–10.2)
Chloride: 98 mmol/L (ref 96–106)
Creatinine, Ser: 1.36 mg/dL — ABNORMAL HIGH (ref 0.76–1.27)
Glucose: 103 mg/dL — ABNORMAL HIGH (ref 70–99)
Potassium: 4.9 mmol/L (ref 3.5–5.2)
Sodium: 141 mmol/L (ref 134–144)
eGFR: 53 mL/min/{1.73_m2} — ABNORMAL LOW (ref >59)

## 2024-03-19 LAB — BRAIN NATRIURETIC PEPTIDE: BNP: 45.9 pg/mL (ref 0.0–100.0)

## 2024-03-20 ENCOUNTER — Other Ambulatory Visit: Payer: Self-pay | Admitting: Urology

## 2024-04-08 ENCOUNTER — Ambulatory Visit (HOSPITAL_COMMUNITY)
Admission: RE | Admit: 2024-04-08 | Discharge: 2024-04-08 | Disposition: A | Discharge status: Home / Self Care | Source: Ambulatory Visit | Attending: Physician Assistant | Admitting: Physician Assistant

## 2024-04-08 DIAGNOSIS — R600 Localized edema: Secondary | ICD-10-CM | POA: Insufficient documentation

## 2024-04-08 DIAGNOSIS — R0602 Shortness of breath: Secondary | ICD-10-CM | POA: Diagnosis not present

## 2024-04-08 LAB — ECHOCARDIOGRAM COMPLETE
Area-P 1/2: 2.7 cm2
S' Lateral: 3.8 cm

## 2024-04-16 ENCOUNTER — Telehealth: Payer: Self-pay | Admitting: Physician Assistant

## 2024-04-16 DIAGNOSIS — N189 Chronic kidney disease, unspecified: Secondary | ICD-10-CM | POA: Diagnosis not present

## 2024-04-16 DIAGNOSIS — Z79899 Other long term (current) drug therapy: Secondary | ICD-10-CM | POA: Diagnosis not present

## 2024-04-16 NOTE — Telephone Encounter (Signed)
 The patient has been notified of the result and verbalized understanding.  All questions (if any) were answered. Casper Clement, [facility] 04/16/2024 3:46 PM

## 2024-04-16 NOTE — Telephone Encounter (Signed)
 Follow Up:     Patient's wife is returning a call from last week, concerning his Echo results.

## 2024-04-16 NOTE — Telephone Encounter (Signed)
 Devin Actis, PA-C 04/08/2024  8:54 PM EDT   Please notify patient: ECHO results reviewed. Overall, echo is normal and stable. There was small improvement in pumping function in the heart to 60-65%. The right side of heart function is mildly reduced but not severe. There is some minor wear and tear on a couple of the heart valves - very mild leakage in your mitral valve and some thickening of your aortic valve, but no serious blockage or narrowing that we would need to treat right now. The large blood vessel that comes off the heart (aorta) is a little larger than normal. Overall, heart is stable, does not require any immediate treatment and we will continue to monitor over time.

## 2024-05-05 DIAGNOSIS — I2510 Atherosclerotic heart disease of native coronary artery without angina pectoris: Secondary | ICD-10-CM | POA: Diagnosis not present

## 2024-05-05 DIAGNOSIS — R609 Edema, unspecified: Secondary | ICD-10-CM | POA: Diagnosis not present

## 2024-05-05 DIAGNOSIS — J60 Coalworker's pneumoconiosis: Secondary | ICD-10-CM | POA: Diagnosis not present

## 2024-05-13 DIAGNOSIS — L03115 Cellulitis of right lower limb: Secondary | ICD-10-CM | POA: Diagnosis not present

## 2024-05-13 DIAGNOSIS — L600 Ingrowing nail: Secondary | ICD-10-CM | POA: Diagnosis not present

## 2024-05-15 ENCOUNTER — Encounter: Payer: Self-pay | Admitting: Podiatry

## 2024-05-15 ENCOUNTER — Ambulatory Visit (INDEPENDENT_AMBULATORY_CARE_PROVIDER_SITE_OTHER): Admitting: Podiatry

## 2024-05-15 DIAGNOSIS — L02612 Cutaneous abscess of left foot: Secondary | ICD-10-CM | POA: Diagnosis not present

## 2024-05-15 DIAGNOSIS — L600 Ingrowing nail: Secondary | ICD-10-CM | POA: Diagnosis not present

## 2024-05-15 DIAGNOSIS — L03032 Cellulitis of left toe: Secondary | ICD-10-CM

## 2024-05-15 MED ORDER — NEOMYCIN-POLYMYXIN-HC 3.5-10000-1 OT SUSP: OTIC | 0 refills | Status: DC
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
## 2024-05-15 NOTE — Patient Instructions (Signed)

## 2024-06-10 NOTE — Therapy (Unsigned)
 OUTPATIENT PHYSICAL THERAPY LYMPHEDEMA EVALUATION  Patient Name: Devin [name] MRN: [id number] DOB:[birth year], 78 y.o., male Today's Date: 06/16/2024  END OF SESSION:  PT End of Session - 06/16/24 0912     Visit Number 1    Number of Visits 18    Date for PT Re-Evaluation 07/28/24    Authorization Type medicare    PT Start Time 0818    PT Stop Time 0917    PT Time Calculation (min) 59 min    Behavior During Therapy [facility] for tasks assessed/performed          Past Medical History:  Diagnosis Date   Arthritis    Asbestosis (HCC)    CAD (coronary artery disease)    Multivessel status post CABG March 2016 - LIMA to LAD, SVG to OM1, SVG to OM2, and SVG to PDA.   Essential hypertension    GERD (gastroesophageal reflux disease)    Melanoma of back (HCC)    Prostatic hypertrophy    Sleep apnea    Intolerant of CPAP   Past Surgical History:  Procedure Laterality Date   ANTERIOR CERVICAL DECOMP/DISCECTOMY FUSION     CARDIOVERSION N/A 01/28/2015   Procedure: CARDIOVERSION;  Surgeon: Dorn JULIANNA Ross, MD;  Location: [facility];  Service: Endoscopy;  Laterality: N/A;   CHOLECYSTECTOMY N/A 04/07/2020   Procedure: LAPAROSCOPIC CHOLECYSTECTOMY;  Surgeon: Kallie Manuelita BROCKS, MD;  Location: [facility];  Service: General;  Laterality: N/A;   CORONARY ARTERY BYPASS GRAFT N/A 12/29/2014   Procedure: CORONARY ARTERY BYPASS GRAFTING (CABG), ON PUMP, TIMES FOUR, USING LEFT INTERNAL MAMMARY ARTERY, RIGHT GREATER SAPHENOUS VEIN HARVESTED ENDOSCOPICALLY;  Surgeon: Maude Fleeta Ochoa, MD;  Location: [facility] OR;  Service: Open Heart Surgery;  Laterality: N/A;   KNEE ARTHROSCOPY Right    KNEE CARTILAGE SURGERY Left    LEFT HEART CATHETERIZATION WITH CORONARY ANGIOGRAM N/A 12/28/2014   Procedure: LEFT HEART CATHETERIZATION WITH CORONARY ANGIOGRAM;  Surgeon: Dorn JINNY Lesches, MD;  Location: [facility] CATH LAB;  Service: Cardiovascular;  Laterality: N/A;   MELANOMA EXCISION     back   SHOULDER OPEN ROTATOR CUFF REPAIR  Left    TEE WITHOUT CARDIOVERSION N/A 12/29/2014   Procedure: TRANSESOPHAGEAL ECHOCARDIOGRAM (TEE);  Surgeon: Maude Fleeta Ochoa, MD;  Location: [facility] OR;  Service: Open Heart Surgery;  Laterality: N/A;   TEE WITHOUT CARDIOVERSION N/A 01/28/2015   Procedure: TRANSESOPHAGEAL ECHOCARDIOGRAM (TEE);  Surgeon: Dorn JULIANNA Ross, MD;  Location: [facility];  Service: Endoscopy;  Laterality: N/A;   Patient Active Problem List   Diagnosis Date Noted   Cough variant asthma vs UACS  02/17/2022   DOE (dyspnea on exertion) 12/07/2020   Calculus of gallbladder with acute cholecystitis without obstruction 04/06/2020   Hypogonadism in male 10/17/2019   Nocturia 10/17/2019   Erectile dysfunction due to arterial insufficiency 10/17/2019   Atrial fibrillation and flutter (HCC) 01/23/2015   Palpitations 01/23/2015   Hyperlipidemia 01/23/2015   BPH with urinary obstruction 01/23/2015   Chronic diastolic CHF (congestive heart failure) /HFpEF 01/23/2015   Atrial fibrillation with RVR (HCC) 01/23/2015   Obstructive sleep apnea--- refuses CPAP 01/18/2015   Essential hypertension 01/18/2015   Leg edema 01/18/2015   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   CAD (coronary artery disease), native coronary artery/H/o CABG x 4 12/26/2014    PCP: Sheryle Carwin, MD  REFERRING PROVIDER: Sheryle Carwin, MD  REFERRING DIAG:  Free Text Diagnosis  right leg cellulitis    THERAPY DIAG:  Edema most likely lymphedema causing cellulitis in both LE  Rationale for Evaluation and Treatment: Rehabilitation  ONSET DATE: chronic at least  since 2016  SUBJECTIVE:                                                                                                                                                                                           SUBJECTIVE STATEMENT:  Pt states that he has had increased edema in his LE since 2016 it has been getting progressively worse.  He has seen his cardiologist who had all kinds of test taken and it is not his heart  causing the issue.  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  He has used compression, he has elevated, he has been on diuretics, he has exercised for over 6 months without any improvement.  He use to be able to walk miles but he is not able to at this time.  States that they are not swollen like they normally are.  States that he has gone up 3 shoe sizes due to the swelling.  He has been diagnosed with cellulitis multiple times and is now being referred to skilled PT.  PERTINENT HISTORY: leg edema, CAD,  PAIN:  Are you having pain? Yes NPRS scale: 3/10  Pain location: Lt foot  Pain orientation: Left  PAIN TYPE: itching and tightness  Pain description: intermittent  Aggravating factors: activity Relieving factors: mornings   PRECAUTIONS: Other: cellulitis   RED FLAGS: None   WEIGHT BEARING RESTRICTIONS: No  FALLS:  Has patient fallen in last 6 months? No  LIVING ENVIRONMENT: Lives with: lives with their spouse Lives in: House/apartment OCCUPATION: retired   LEISURE: walking   PRIOR LEVEL OF FUNCTION: Independent  PATIENT GOALS: less swelling no infection    OBJECTIVE: Note: Objective measures were completed at Evaluation unless otherwise noted.  COGNITION: Overall cognitive status: Within functional limits for tasks assessed   PALPATION: Noted induration   OBSERVATIONS / OTHER ASSESSMENTS: noted erythema ,positive stemmer sign   LYMPHEDEMA ASSESSMENTS:    LE LANDMARK RIGHT eval  At groin   30 cm proximal to suprapatella   20 cm proximal to suprapatella 67  10 cm proximal to suprapatella 57.8  At midpatella / popliteal crease 49.3  30 cm proximal to floor at lateral plantar foot 55  20 cm proximal to floor at lateral plantar foot 47.3  10 cm proximal to floor at lateral plantar foot 36.8  Circumference of ankle/heel 40.9  5 cm proximal to 1st MTP joint 29.7   Across  MTP joint 28.9  Around proximal great toe 11  (Blank rows = not tested)  LE LANDMARK LEFT eval  At groin   30 cm proximal to suprapatella   20 cm proximal to suprapatella 64.8  10 cm proximal to suprapatella 58.7  At midpatella / popliteal crease 52.4  30 cm proximal to floor at lateral plantar foot 53.6  20 cm proximal to floor at lateral plantar foot 46.7  10 cm proximal to floor at lateral plantar foot 38.4  Circumference of ankle/heel 40.5  5 cm proximal to 1st MTP joint 29.4  Across MTP joint 28.6  Around proximal great toe 11.6  (Blank rows = not tested)  FUNCTIONAL TESTS:  30 seconds chair stand test:  9 average for age is 11-15 2 minute walk test: 348 ft   TODAY'S TREATMENT:                                                                                                                              DATE: 06/16/24 Evaluation and self care.  Self care to include skin care; exercise, what manual and compression will consist of  PATIENT EDUCATION:  Education details: HEP, what lymphedema is and how it is controlled,(four aspects to treatment) Person educated: Patient Education method: Explanation, Demonstration, Verbal cues, and Handouts Education comprehension: verbalized understanding  HOME EXERCISE PROGRAM: Access Code: [email or web address] URL: [email or web address] Date: 06/16/2024 Prepared by: Montie Metro  Exercises - Seated Diaphragmatic Breathing  - 1 x daily - 7 x weekly - 10 reps - 5 hold - Seated Cervical Sidebending AROM  - 1 x daily - 7 x weekly - 1 sets - 10 reps - 2-3 hold - Seated Cervical Rotation AROM  - 1 x daily - 7 x weekly - 1 sets - 10 reps - 2-3 hold - Seated Cervical Extension AROM  - 1 x daily - 7 x weekly - 1 sets - 10 reps - 2-3 hold - Seated Cervical Retraction  - 1 x daily - 7 x weekly - 1 sets - 10 reps - 2-3 hold - Shoulder Rolls in Sitting  - 1 x daily - 7 x weekly - 1 sets - 10 reps - 2-3 hold - Seated Sidebending  Arms Overhead  - 1 x daily - 7 x weekly - 1 sets - 10 reps - 2-3 hold - Seated Hip Abduction  - 1 x daily - 7 x weekly - 1 sets - 10 reps - 2-3 hold - Seated Long Arc Quad  - 1 x daily - 7 x weekly - 1 sets - 10 reps - 2-3 hold - Seated Heel Toe Raises  - 1 x daily - 7 x weekly - 1 sets - 10 reps - 2-3 hold - Seated Toe Curl  - 1 x daily - 7 x weekly - 1 sets - 10 reps - 2-3 hold  ASSESSMENT:  CLINICAL IMPRESSION: Patient is a 78 y.o. male who was seen today  for physical therapy evaluation and treatment for lymphedema /cellulitis of  both LE.  The pt has chronic edema,(lymphedema), in both LE.  Pt has positive stemmer's sign, Bilateral induration, B erythema indicative of lymphedema.  Mr. Rogerson will benefit from a compression pump to use at home and education in self manual techniques as well as total decongestive techniques to decrease the size of his legs and improve his skin health as well as reducing the risk of cellulitis.   OBJECTIVE IMPAIRMENTS: difficulty walking, increased edema, pain, and decreased skin integrity .   ACTIVITY LIMITATIONS: dressing, hygiene/grooming, and locomotion level  REHAB POTENTIAL: Good  CLINICAL DECISION MAKING: Evolving/moderate complexity  EVALUATION COMPLEXITY: Moderate  GOALS: Goals reviewed with patient? No  SHORT TERM GOALS: Target date: 07/07/24  Pt to be I in HEP to improve lymphatic circulation  Baseline: Goal status: INITIAL  2.  Pt to be completing daily skin care to assist in preventing cellulitis  Baseline:  Goal status: INITIAL  3.  Pt to have lost 3 cm from Lt LE measurements Baseline:  Goal status: INITIAL   LONG TERM GOALS: Target date: 07/28/24  PT to be I in self manual techniques  Baseline:  Goal status: INITIAL  2.  Pt to understand that he should be wearing compression wear daily Baseline:  Goal status: INITIAL  3.  PT to have lost 4-5 cm from Lt LE daily to assist in preventing cellulitis  Baseline:  Goal  status: INITIAL 4.  PT to have and be using a compression pump to assist in decreasing fluid in his LE     PLAN:  PT FREQUENCY: 3x/week  PT DURATION: 6 weeks  PLANNED INTERVENTIONS: 97110-Therapeutic exercises, 97535- Self Care, and 02859- Manual therapy  PLAN FOR NEXT SESSION: cut foam begin decongestive techniques educate pt in self manual techniques.   Montie Metro, PT CLT [phone]  06/16/2024, 10:12 AM

## 2024-06-12 ENCOUNTER — Ambulatory Visit (INDEPENDENT_AMBULATORY_CARE_PROVIDER_SITE_OTHER): Admitting: Podiatry

## 2024-06-12 ENCOUNTER — Encounter: Payer: Self-pay | Admitting: Podiatry

## 2024-06-12 DIAGNOSIS — L02612 Cutaneous abscess of left foot: Secondary | ICD-10-CM

## 2024-06-12 DIAGNOSIS — R600 Localized edema: Secondary | ICD-10-CM

## 2024-06-12 DIAGNOSIS — L03032 Cellulitis of left toe: Secondary | ICD-10-CM | POA: Diagnosis not present

## 2024-06-12 DIAGNOSIS — L600 Ingrowing nail: Secondary | ICD-10-CM

## 2024-06-12 MED ORDER — NEOMYCIN-POLYMYXIN-HC 3.5-10000-1 OT SUSP: OTIC | 0 refills | Status: AC

## 2024-06-12 MED ORDER — DOXYCYCLINE HYCLATE 100 MG PO TABS: 100.0000 mg | ORAL_TABLET | Freq: Two times a day (BID) | ORAL | 0 refills | Status: AC

## 2024-06-12 NOTE — Patient Instructions (Addendum)
 Place 1/4 cup of epsom salts in a quart of warm tap water.  Submerge your foot or feet in the solution and soak for 20 minutes.  This soak should be done twice a day.  Next, remove your foot or feet from solution, blot dry the affected area. Apply antibiotic drops and cover if instructed by your doctor.   IF YOUR SKIN BECOMES IRRITATED WHILE USING THESE INSTRUCTIONS, IT IS OKAY TO SWITCH TO  WHITE VINEGAR AND WATER.  As another alternative soak, you may use antibacterial soap and water.  Monitor for any signs/symptoms of infection. Call the office immediately if any occur or go directly to the emergency room. Call with any questions/concerns.  For the doxycycline , it is important to take it with couple of water.  Avoid any iron, calcium , or any other metal containing medications ie tums for 2 hours after taking the antibiotic. If taking these medications in the afternoon, wait 4 hours before the doxycycline .

## 2024-06-12 NOTE — Progress Notes (Signed)
 Chief Complaint  Patient presents with   PNA check    L Big toe still red and a little sore. Feels a lot better .L foot is swollen. Soaking once a day max.  Non Diabetic. ASA    HPI: 78 y.o. male presents today following left first toe PNA of bilateral borders, 05/15/2024.  He is accompanied by his wife today.  Denies significant pain to the area.  States that the sites have been somewhat slow to heal.  Does report some mild redness to the area.  He does have chronic swelling affecting both lower extremities.  Of note, he did not pick up his medications following procedure.  He was also dispensed a postop shoe due to showing up to the clinic barefoot due to inability to wear shoe, was told he could return to regular shoes he shows up using the postop shoe today.  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  Past Medical History:  Diagnosis Date   Arthritis    Asbestosis (HCC)    CAD (coronary artery disease)    Multivessel status post CABG March 2016 - LIMA to LAD, SVG to OM1, SVG to OM2, and SVG to PDA.   Essential hypertension    GERD (gastroesophageal reflux disease)    Melanoma of back (HCC)    Prostatic hypertrophy    Sleep apnea    Intolerant of CPAP    Past Surgical History:  Procedure Laterality Date   ANTERIOR CERVICAL DECOMP/DISCECTOMY FUSION     CARDIOVERSION N/A 01/28/2015   Procedure: CARDIOVERSION;  Surgeon: Dorn JULIANNA Ross, MD;  Location: [facility];  Service: Endoscopy;  Laterality: N/A;   CHOLECYSTECTOMY N/A 04/07/2020   Procedure: LAPAROSCOPIC CHOLECYSTECTOMY;  Surgeon: Kallie Manuelita BROCKS, MD;  Location: [facility];  Service: General;  Laterality: N/A;   CORONARY ARTERY BYPASS GRAFT N/A 12/29/2014   Procedure: CORONARY ARTERY BYPASS GRAFTING (CABG), ON PUMP, TIMES FOUR, USING LEFT INTERNAL MAMMARY ARTERY, RIGHT GREATER SAPHENOUS VEIN HARVESTED ENDOSCOPICALLY;  Surgeon: Maude Fleeta Ochoa, MD;  Location: [facility] OR;  Service: Open Heart Surgery;   Laterality: N/A;   KNEE ARTHROSCOPY Right    KNEE CARTILAGE SURGERY Left    LEFT HEART CATHETERIZATION WITH CORONARY ANGIOGRAM N/A 12/28/2014   Procedure: LEFT HEART CATHETERIZATION WITH CORONARY ANGIOGRAM;  Surgeon: Dorn JINNY Lesches, MD;  Location: [facility] CATH LAB;  Service: Cardiovascular;  Laterality: N/A;   MELANOMA EXCISION     back   SHOULDER OPEN ROTATOR CUFF REPAIR Left    TEE WITHOUT CARDIOVERSION N/A 12/29/2014   Procedure: TRANSESOPHAGEAL ECHOCARDIOGRAM (TEE);  Surgeon: Maude Fleeta Ochoa, MD;  Location: [facility] OR;  Service: Open Heart Surgery;  Laterality: N/A;   TEE WITHOUT CARDIOVERSION N/A 01/28/2015   Procedure: TRANSESOPHAGEAL ECHOCARDIOGRAM (TEE);  Surgeon: Dorn JULIANNA Ross, MD;  Location: [facility];  Service: Endoscopy;  Laterality: N/A;    Allergies  Allergen Reactions   Lasix  [Furosemide ] Nausea Only    ROS    Physical Exam: There were no vitals filed for this visit.  General: The patient is alert and oriented x3 in no acute distress.  Dermatology: Left first toe medial lateral border PNA sites with granulation tissue to the wound beds.  No stable scab at this point. No significant drainage.  Mild pain on palpation.  Minimal erythema to the toe.  Vascular: Palpable pedal pulses bilaterally. Capillary refill within normal limits.  Significant edema present bilaterally, +2 to +3 pitting edema to the  foot and pretibial level.  Neurological: Light touch sensation grossly intact bilateral feet.   Musculoskeletal Exam: No pedal deformities noted.  Muscle strength 5/5 all major muscle groups.  Pes planus foot type.  Assessment/Plan of Care: 1. Ingrown left big toenail   2. Cellulitis and abscess of toe of left foot   3. Peripheral edema      Meds ordered this encounter  Medications   doxycycline  (VIBRA -TABS) 100 MG tablet    Sig: Take 1 tablet (100 mg total) by mouth 2 (two) times daily for 10 days.    Dispense:  20 tablet    Refill:  0   neomycin -polymyxin-hydrocortisone  (CORTISPORIN) 3.5-10000-1 OTIC suspension    Sig: Apply 1-2 drops daily after soaking and cover with bandaid    Dispense:  10 mL    Refill:  0   None  Discussed clinical findings with patient today.  Did discuss findings of delayed healing.  Does have residual redness to the toe. - Course of oral doxycycline  sent to patient's pharmacy twice a day out of abundance of caution - States that he has been using antibiotic ointment throughout this time with soaking.  Discontinue use of any antibiotic ointment.  Resending prescription for Corticosporin drops, apply 1 to 2 drops to each nail border after foot soak - Foot soak instructions discussed at length with written instructions dispensed.  Soak twice a day in warm Epsom salts or antibacterial soap mixed with warm water for 15 to 20 minutes. - Pat the area dry and allowed to air dry, apply the drops and keep covered with Band-Aid - Continue this until a dry stable scab starts to form, about 2 weeks. - He can return to regular shoes - Regarding the leg edema, do think this could be playing a role in the delayed healing, encouraged use of compression stockings. - Recheck in 2 weeks   Isack Lavalley L. Lamount MAUL, AACFAS [facility]     [address] [place], [place] [address]                Office [phone]  Fax [phone]

## 2024-06-16 ENCOUNTER — Other Ambulatory Visit: Payer: Self-pay

## 2024-06-16 ENCOUNTER — Ambulatory Visit (HOSPITAL_COMMUNITY): Attending: Internal Medicine | Admitting: Physical Therapy

## 2024-06-16 DIAGNOSIS — I890 Lymphedema, not elsewhere classified: Secondary | ICD-10-CM | POA: Diagnosis not present

## 2024-06-16 DIAGNOSIS — R262 Difficulty in walking, not elsewhere classified: Secondary | ICD-10-CM | POA: Diagnosis not present

## 2024-06-16 DIAGNOSIS — M6281 Muscle weakness (generalized): Secondary | ICD-10-CM | POA: Insufficient documentation

## 2024-06-16 DIAGNOSIS — R600 Localized edema: Secondary | ICD-10-CM | POA: Insufficient documentation

## 2024-06-16 DIAGNOSIS — M79672 Pain in left foot: Secondary | ICD-10-CM | POA: Insufficient documentation

## 2024-06-17 ENCOUNTER — Ambulatory Visit: Admitting: Cardiology

## 2024-06-18 ENCOUNTER — Ambulatory Visit (HOSPITAL_COMMUNITY): Admitting: Physical Therapy

## 2024-06-18 DIAGNOSIS — R262 Difficulty in walking, not elsewhere classified: Secondary | ICD-10-CM | POA: Diagnosis not present

## 2024-06-18 DIAGNOSIS — R600 Localized edema: Secondary | ICD-10-CM | POA: Diagnosis not present

## 2024-06-18 DIAGNOSIS — I890 Lymphedema, not elsewhere classified: Secondary | ICD-10-CM

## 2024-06-18 DIAGNOSIS — M6281 Muscle weakness (generalized): Secondary | ICD-10-CM | POA: Diagnosis not present

## 2024-06-18 DIAGNOSIS — M79672 Pain in left foot: Secondary | ICD-10-CM | POA: Diagnosis not present

## 2024-06-18 NOTE — Therapy (Signed)
 OUTPATIENT PHYSICAL THERAPY LYMPHEDEMA Treatment  Patient Name: Devin [name] MRN: [id number] DOB:08/17/1946, 78 y.o., male Today's Date: 06/18/2024  END OF SESSION:  PT End of Session - 06/18/24 0959     Visit Number 2    Number of Visits 18    Date for PT Re-Evaluation 07/28/24    Authorization Type medicare    PT Start Time 0900    PT Stop Time 0953    PT Time Calculation (min) 53 min    Behavior During Therapy [facility] for tasks assessed/performed           Past Medical History:  Diagnosis Date   Arthritis    Asbestosis (HCC)    CAD (coronary artery disease)    Multivessel status post CABG March 2016 - LIMA to LAD, SVG to OM1, SVG to OM2, and SVG to PDA.   Essential hypertension    GERD (gastroesophageal reflux disease)    Melanoma of back (HCC)    Prostatic hypertrophy    Sleep apnea    Intolerant of CPAP   Past Surgical History:  Procedure Laterality Date   ANTERIOR CERVICAL DECOMP/DISCECTOMY FUSION     CARDIOVERSION N/A 01/28/2015   Procedure: CARDIOVERSION;  Surgeon: Dorn JULIANNA Ross, MD;  Location: [facility];  Service: Endoscopy;  Laterality: N/A;   CHOLECYSTECTOMY N/A 04/07/2020   Procedure: LAPAROSCOPIC CHOLECYSTECTOMY;  Surgeon: Kallie Manuelita BROCKS, MD;  Location: [facility];  Service: General;  Laterality: N/A;   CORONARY ARTERY BYPASS GRAFT N/A 12/29/2014   Procedure: CORONARY ARTERY BYPASS GRAFTING (CABG), ON PUMP, TIMES FOUR, USING LEFT INTERNAL MAMMARY ARTERY, RIGHT GREATER SAPHENOUS VEIN HARVESTED ENDOSCOPICALLY;  Surgeon: Maude Fleeta Ochoa, MD;  Location: [facility] OR;  Service: Open Heart Surgery;  Laterality: N/A;   KNEE ARTHROSCOPY Right    KNEE CARTILAGE SURGERY Left    LEFT HEART CATHETERIZATION WITH CORONARY ANGIOGRAM N/A 12/28/2014   Procedure: LEFT HEART CATHETERIZATION WITH CORONARY ANGIOGRAM;  Surgeon: Dorn JINNY Lesches, MD;  Location: [facility] CATH LAB;  Service: Cardiovascular;  Laterality: N/A;   MELANOMA EXCISION     back   SHOULDER OPEN ROTATOR CUFF REPAIR  Left    TEE WITHOUT CARDIOVERSION N/A 12/29/2014   Procedure: TRANSESOPHAGEAL ECHOCARDIOGRAM (TEE);  Surgeon: Maude Fleeta Ochoa, MD;  Location: [facility] OR;  Service: Open Heart Surgery;  Laterality: N/A;   TEE WITHOUT CARDIOVERSION N/A 01/28/2015   Procedure: TRANSESOPHAGEAL ECHOCARDIOGRAM (TEE);  Surgeon: Dorn JULIANNA Ross, MD;  Location: [facility];  Service: Endoscopy;  Laterality: N/A;   Patient Active Problem List   Diagnosis Date Noted   Cough variant asthma vs UACS  02/17/2022   DOE (dyspnea on exertion) 12/07/2020   Calculus of gallbladder with acute cholecystitis without obstruction 04/06/2020   Hypogonadism in male 10/17/2019   Nocturia 10/17/2019   Erectile dysfunction due to arterial insufficiency 10/17/2019   Atrial fibrillation and flutter (HCC) 01/23/2015   Palpitations 01/23/2015   Hyperlipidemia 01/23/2015   BPH with urinary obstruction 01/23/2015   Chronic diastolic CHF (congestive heart failure) /HFpEF 01/23/2015   Atrial fibrillation with RVR (HCC) 01/23/2015   Obstructive sleep apnea--- refuses CPAP 01/18/2015   Essential hypertension 01/18/2015   Leg edema 01/18/2015   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   CAD (coronary artery disease), native coronary artery/H/o CABG x 4 12/26/2014    PCP: Sheryle Carwin, MD  REFERRING PROVIDER: Sheryle Carwin, MD  REFERRING DIAG:  Free Text Diagnosis  right leg cellulitis    THERAPY DIAG:  Edema most likely lymphedema causing cellulitis in both  LE   Rationale for Evaluation and Treatment: Rehabilitation  ONSET DATE: chronic at least  since 2016  SUBJECTIVE:                                                                                                                                                                                           SUBJECTIVE STATEMENT:  Pt states that he has been doing his exercises no questions PERTINENT HISTORY: leg edema, CAD,  PAIN:  Are you having pain? Yes NPRS scale: 3/10  Pain location: Lt foot  Pain  orientation: Left  PAIN TYPE: itching and tightness  Pain description: intermittent  Aggravating factors: activity Relieving factors: mornings   PRECAUTIONS: Other: cellulitis   RED FLAGS: None   WEIGHT BEARING RESTRICTIONS: No  FALLS:  Has patient fallen in last 6 months? No  LIVING ENVIRONMENT: Lives with: lives with their spouse Lives in: House/apartment OCCUPATION: retired   LEISURE: walking   PRIOR LEVEL OF FUNCTION: Independent  PATIENT GOALS: less swelling no infection    OBJECTIVE: Note: Objective measures were completed at Evaluation unless otherwise noted.  COGNITION: Overall cognitive status: Within functional limits for tasks assessed   PALPATION: Noted induration   OBSERVATIONS / OTHER ASSESSMENTS: noted erythema ,positive stemmer sign   LYMPHEDEMA ASSESSMENTS:    LE LANDMARK RIGHT eval  At groin   30 cm proximal to suprapatella   20 cm proximal to suprapatella 67  10 cm proximal to suprapatella 57.8  At midpatella / popliteal crease 49.3  30 cm proximal to floor at lateral plantar foot 55  20 cm proximal to floor at lateral plantar foot 47.3  10 cm proximal to floor at lateral plantar foot 36.8  Circumference of ankle/heel 40.9  5 cm proximal to 1st MTP joint 29.7  Across MTP joint 28.9  Around proximal great toe 11  (Blank rows = not tested)  LE LANDMARK LEFT eval  At groin   30 cm proximal to suprapatella   20 cm proximal to suprapatella 64.8  10 cm proximal to suprapatella 58.7  At midpatella / popliteal crease 52.4  30 cm proximal to floor at lateral plantar foot 53.6  20 cm proximal to floor at lateral plantar foot 46.7  10 cm proximal to floor at lateral plantar foot 38.4  Circumference of ankle/heel 40.5  5 cm proximal to 1st MTP joint 29.4  Across MTP joint 28.6  Around proximal great toe 11.6  (Blank rows = not tested)  FUNCTIONAL TESTS:  30 seconds chair stand test:  9 average for age is 11-15 2 minute walk test: 348  ft   TODAY'S TREATMENT:  DATE: 06/18/24 Therapist cut foam for B LE.  Therapist then completed manual techniques to include supraclavicular, inguinal/axillary anastomosis and B LE.  Manual was completed B and completed until pt had decreased induration in LE>    Education details: 06/18/24:  self manual techniques  06/16/24 : HEP, what lymphedema is and how it is controlled,(four aspects to treatment) Person educated: Patient Education method: Explanation, Demonstration, Verbal cues, and Handouts Education comprehension: verbalized understanding  HOME EXERCISE PROGRAM: Access Code: [email or web address] URL: [email or web address] Date: 06/16/2024 Prepared by: Montie Metro  Exercises - Seated Diaphragmatic Breathing  - 1 x daily - 7 x weekly - 10 reps - 5 hold - Seated Cervical Sidebending AROM  - 1 x daily - 7 x weekly - 1 sets - 10 reps - 2-3 hold - Seated Cervical Rotation AROM  - 1 x daily - 7 x weekly - 1 sets - 10 reps - 2-3 hold - Seated Cervical Extension AROM  - 1 x daily - 7 x weekly - 1 sets - 10 reps - 2-3 hold - Seated Cervical Retraction  - 1 x daily - 7 x weekly - 1 sets - 10 reps - 2-3 hold - Shoulder Rolls in Sitting  - 1 x daily - 7 x weekly - 1 sets - 10 reps - 2-3 hold - Seated Sidebending Arms Overhead  - 1 x daily - 7 x weekly - 1 sets - 10 reps - 2-3 hold - Seated Hip Abduction  - 1 x daily - 7 x weekly - 1 sets - 10 reps - 2-3 hold - Seated Long Arc Quad  - 1 x daily - 7 x weekly - 1 sets - 10 reps - 2-3 hold - Seated Heel Toe Raises  - 1 x daily - 7 x weekly - 1 sets - 10 reps - 2-3 hold - Seated Toe Curl  - 1 x daily - 7 x weekly - 1 sets - 10 reps - 2-3 hold  ASSESSMENT:  CLINICAL IMPRESSION:  Therapist instructed both pt and wife in self manual techniques.  Pt has significant induration B with multiple scab areas that are  red due to decrease skin integrity.  PT has not ordered bandages as they had questions which were answered; they will order today.   Pt has positive stemmer's sign, Bilateral induration, B erythema indicative of lymphedema.  Mr. Knippel will benefit from  total decongestive techniques to decrease the size of his legs and improve his skin health as well as reducing the risk of cellulitis.   OBJECTIVE IMPAIRMENTS: difficulty walking, increased edema, pain, and decreased skin integrity .   ACTIVITY LIMITATIONS: dressing, hygiene/grooming, and locomotion level  REHAB POTENTIAL: Good  CLINICAL DECISION MAKING: Evolving/moderate complexity  EVALUATION COMPLEXITY: Moderate  GOALS: Goals reviewed with patient? No  SHORT TERM GOALS: Target date: 07/07/24  Pt to be I in HEP to improve lymphatic circulation  Baseline: Goal status: on-going   2.  Pt to be completing daily skin care to assist in preventing cellulitis  Baseline:  Goal status: on-going  3.  Pt to have lost 3 cm from Lt LE measurements Baseline:  Goal status: on-going   LONG TERM GOALS: Target date: 07/28/24  PT to be I in self manual techniques  Baseline:  Goal status: on-going  2.  Pt to understand that he should be wearing compression wear daily Baseline:  Goal status: on-going  3.  PT to have lost 4-5 cm from Lt LE daily to assist in  preventing cellulitis  Baseline:  Goal status: on-going 4.  PT to have and be using a compression pump to assist in decreasing fluid in his LE                     Goal Status:  on going   PLAN:  PT FREQUENCY: 3x/week  PT DURATION: 6 weeks  PLANNED INTERVENTIONS: 97110-Therapeutic exercises, 97535- Self Care, and 02859- Manual therapy  PLAN FOR NEXT SESSION: Begin total decongestive techniques  Montie Metro, PT CLT [phone]  06/18/2024, 10:05 AM

## 2024-06-20 ENCOUNTER — Ambulatory Visit (HOSPITAL_COMMUNITY)

## 2024-06-20 ENCOUNTER — Encounter (HOSPITAL_COMMUNITY): Payer: Self-pay

## 2024-06-20 ENCOUNTER — Encounter: Payer: Self-pay | Admitting: Cardiology

## 2024-06-20 ENCOUNTER — Ambulatory Visit (INDEPENDENT_AMBULATORY_CARE_PROVIDER_SITE_OTHER): Admitting: Cardiology

## 2024-06-20 VITALS — BP 135/62 | HR 74 | Ht 72.0 in | Wt 305.4 lb

## 2024-06-20 DIAGNOSIS — R600 Localized edema: Secondary | ICD-10-CM

## 2024-06-20 DIAGNOSIS — M79672 Pain in left foot: Secondary | ICD-10-CM | POA: Diagnosis not present

## 2024-06-20 DIAGNOSIS — I890 Lymphedema, not elsewhere classified: Secondary | ICD-10-CM | POA: Diagnosis not present

## 2024-06-20 DIAGNOSIS — E782 Mixed hyperlipidemia: Secondary | ICD-10-CM | POA: Diagnosis not present

## 2024-06-20 DIAGNOSIS — I25709 Atherosclerosis of coronary artery bypass graft(s), unspecified, with unspecified angina pectoris: Secondary | ICD-10-CM | POA: Insufficient documentation

## 2024-06-20 DIAGNOSIS — I10 Essential (primary) hypertension: Secondary | ICD-10-CM | POA: Diagnosis not present

## 2024-06-20 DIAGNOSIS — M6281 Muscle weakness (generalized): Secondary | ICD-10-CM | POA: Diagnosis not present

## 2024-06-20 DIAGNOSIS — R262 Difficulty in walking, not elsewhere classified: Secondary | ICD-10-CM | POA: Diagnosis not present

## 2024-06-20 NOTE — Therapy (Signed)
 OUTPATIENT PHYSICAL THERAPY LYMPHEDEMA Treatment  Patient Name: Devin [name] MRN: [id number] DOB:04/02/1946, 78 y.o., male Today's Date: 06/20/2024  END OF SESSION:  PT End of Session - 06/20/24 0809     Visit Number 3    Number of Visits 18    Date for PT Re-Evaluation 07/28/24    Authorization Type medicare    PT Start Time 0719    PT Stop Time 0808    PT Time Calculation (min) 49 min    Activity Tolerance Patient tolerated treatment well    Behavior During Therapy [facility] for tasks assessed/performed           Past Medical History:  Diagnosis Date   Arthritis    Asbestosis (HCC)    CAD (coronary artery disease)    Multivessel status post CABG March 2016 - LIMA to LAD, SVG to OM1, SVG to OM2, and SVG to PDA.   Essential hypertension    GERD (gastroesophageal reflux disease)    Melanoma of back (HCC)    Prostatic hypertrophy    Sleep apnea    Intolerant of CPAP   Past Surgical History:  Procedure Laterality Date   ANTERIOR CERVICAL DECOMP/DISCECTOMY FUSION     CARDIOVERSION N/A 01/28/2015   Procedure: CARDIOVERSION;  Surgeon: Dorn JULIANNA Ross, MD;  Location: [facility];  Service: Endoscopy;  Laterality: N/A;   CHOLECYSTECTOMY N/A 04/07/2020   Procedure: LAPAROSCOPIC CHOLECYSTECTOMY;  Surgeon: Kallie Manuelita BROCKS, MD;  Location: [facility];  Service: General;  Laterality: N/A;   CORONARY ARTERY BYPASS GRAFT N/A 12/29/2014   Procedure: CORONARY ARTERY BYPASS GRAFTING (CABG), ON PUMP, TIMES FOUR, USING LEFT INTERNAL MAMMARY ARTERY, RIGHT GREATER SAPHENOUS VEIN HARVESTED ENDOSCOPICALLY;  Surgeon: Maude Fleeta Ochoa, MD;  Location: [facility] OR;  Service: Open Heart Surgery;  Laterality: N/A;   KNEE ARTHROSCOPY Right    KNEE CARTILAGE SURGERY Left    LEFT HEART CATHETERIZATION WITH CORONARY ANGIOGRAM N/A 12/28/2014   Procedure: LEFT HEART CATHETERIZATION WITH CORONARY ANGIOGRAM;  Surgeon: Dorn JINNY Lesches, MD;  Location: [facility] CATH LAB;  Service: Cardiovascular;  Laterality: N/A;   MELANOMA  EXCISION     back   SHOULDER OPEN ROTATOR CUFF REPAIR Left    TEE WITHOUT CARDIOVERSION N/A 12/29/2014   Procedure: TRANSESOPHAGEAL ECHOCARDIOGRAM (TEE);  Surgeon: Maude Fleeta Ochoa, MD;  Location: [facility] OR;  Service: Open Heart Surgery;  Laterality: N/A;   TEE WITHOUT CARDIOVERSION N/A 01/28/2015   Procedure: TRANSESOPHAGEAL ECHOCARDIOGRAM (TEE);  Surgeon: Dorn JULIANNA Ross, MD;  Location: [facility];  Service: Endoscopy;  Laterality: N/A;   Patient Active Problem List   Diagnosis Date Noted   Cough variant asthma vs UACS  02/17/2022   DOE (dyspnea on exertion) 12/07/2020   Calculus of gallbladder with acute cholecystitis without obstruction 04/06/2020   Hypogonadism in male 10/17/2019   Nocturia 10/17/2019   Erectile dysfunction due to arterial insufficiency 10/17/2019   Atrial fibrillation and flutter (HCC) 01/23/2015   Palpitations 01/23/2015   Hyperlipidemia 01/23/2015   BPH with urinary obstruction 01/23/2015   Chronic diastolic CHF (congestive heart failure) /HFpEF 01/23/2015   Atrial fibrillation with RVR (HCC) 01/23/2015   Obstructive sleep apnea--- refuses CPAP 01/18/2015   Essential hypertension 01/18/2015   Leg edema 01/18/2015   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   CAD (coronary artery disease), native coronary artery/H/o CABG x 4 12/26/2014    PCP: Sheryle Carwin, MD  REFERRING PROVIDER: Sheryle Carwin, MD  REFERRING DIAG:  Free Text Diagnosis  right leg cellulitis    THERAPY DIAG:  Edema most likely lymphedema causing cellulitis in both LE   Rationale for Evaluation and Treatment: Rehabilitation  ONSET DATE: chronic at least  since 2016  SUBJECTIVE:                                                                                                                                                                                           SUBJECTIVE STATEMENT:  Placed order for short stretch and have received call with pump ordered.  Exercises and self manual going well at home. PERTINENT  HISTORY: leg edema, CAD,  PAIN:  Are you having pain? Yes NPRS scale: 3/10  Pain location: Lt foot  Pain orientation: Left  PAIN TYPE: itching and tightness  Pain description: intermittent  Aggravating factors: activity Relieving factors: mornings   PRECAUTIONS: Other: cellulitis   RED FLAGS: None   WEIGHT BEARING RESTRICTIONS: No  FALLS:  Has patient fallen in last 6 months? No  LIVING ENVIRONMENT: Lives with: lives with their spouse Lives in: House/apartment OCCUPATION: retired   LEISURE: walking   PRIOR LEVEL OF FUNCTION: Independent  PATIENT GOALS: less swelling no infection    OBJECTIVE: Note: Objective measures were completed at Evaluation unless otherwise noted.  COGNITION: Overall cognitive status: Within functional limits for tasks assessed   PALPATION: Noted induration   OBSERVATIONS / OTHER ASSESSMENTS: noted erythema ,positive stemmer sign   LYMPHEDEMA ASSESSMENTS:    LE LANDMARK RIGHT eval  At groin   30 cm proximal to suprapatella   20 cm proximal to suprapatella 67  10 cm proximal to suprapatella 57.8  At midpatella / popliteal crease 49.3  30 cm proximal to floor at lateral plantar foot 55  20 cm proximal to floor at lateral plantar foot 47.3  10 cm proximal to floor at lateral plantar foot 36.8  Circumference of ankle/heel 40.9  5 cm proximal to 1st MTP joint 29.7  Across MTP joint 28.9  Around proximal great toe 11  (Blank rows = not tested)  LE LANDMARK LEFT eval  At groin   30 cm proximal to suprapatella   20 cm proximal to suprapatella 64.8  10 cm proximal to suprapatella 58.7  At midpatella / popliteal crease 52.4  30 cm proximal to floor at lateral plantar foot 53.6  20 cm proximal to floor at lateral plantar foot 46.7  10 cm proximal to floor at lateral plantar foot 38.4  Circumference of ankle/heel 40.5  5 cm proximal to 1st MTP joint 29.4  Across MTP joint 28.6  Around proximal great toe 11.6  (Blank rows = not  tested)  FUNCTIONAL TESTS:  30 seconds chair stand test:  9 average for age is 11-15 2 minute walk test: 348 ft   TODAY'S TREATMENT:                                                                                                                              DATE:  06/20/24: Manual decongestive supraclavicular, short and deep abdominal, inguinal/axillary anastomosis and B LE anterior supine position and posterior in sidelying.   06/18/24 Therapist cut foam for B LE.  Therapist then completed manual techniques to include supraclavicular, inguinal/axillary anastomosis and B LE.  Manual was completed B and completed until pt had decreased induration in LE>    Education details: 06/18/24:  self manual techniques  06/16/24 : HEP, what lymphedema is and how it is controlled,(four aspects to treatment) Person educated: Patient Education method: Explanation, Demonstration, Verbal cues, and Handouts Education comprehension: verbalized understanding  HOME EXERCISE PROGRAM: Access Code: [email or web address] URL: [email or web address] Date: 06/16/2024 Prepared by: Montie Metro  Exercises - Seated Diaphragmatic Breathing  - 1 x daily - 7 x weekly - 10 reps - 5 hold - Seated Cervical Sidebending AROM  - 1 x daily - 7 x weekly - 1 sets - 10 reps - 2-3 hold - Seated Cervical Rotation AROM  - 1 x daily - 7 x weekly - 1 sets - 10 reps - 2-3 hold - Seated Cervical Extension AROM  - 1 x daily - 7 x weekly - 1 sets - 10 reps - 2-3 hold - Seated Cervical Retraction  - 1 x daily - 7 x weekly - 1 sets - 10 reps - 2-3 hold - Shoulder Rolls in Sitting  - 1 x daily - 7 x weekly - 1 sets - 10 reps - 2-3 hold - Seated Sidebending Arms Overhead  - 1 x daily - 7 x weekly - 1 sets - 10 reps - 2-3 hold - Seated Hip Abduction  - 1 x daily - 7 x weekly - 1 sets - 10 reps - 2-3 hold - Seated Long Arc Quad  - 1 x daily - 7 x weekly - 1 sets - 10 reps - 2-3 hold - Seated Heel Toe Raises  - 1 x daily - 7 x  weekly - 1 sets - 10 reps - 2-3 hold - Seated Toe Curl  - 1 x daily - 7 x weekly - 1 sets - 10 reps - 2-3 hold  ASSESSMENT:  CLINICAL IMPRESSION:   Manual decongestive techniques anterior and posterior in sidelying with main focus on distal LE, induration and erythema present.  Reviewed self manual.  Pt has ordered short stretch and stated he received call indication pump has been order.    OBJECTIVE IMPAIRMENTS: difficulty walking, increased edema, pain, and decreased skin integrity .   ACTIVITY LIMITATIONS: dressing, hygiene/grooming, and locomotion level  REHAB POTENTIAL: Good  CLINICAL DECISION MAKING: Evolving/moderate complexity  EVALUATION COMPLEXITY: Moderate  GOALS: Goals reviewed with patient? No  SHORT TERM GOALS: Target date:  07/07/24  Pt to be I in HEP to improve lymphatic circulation  Baseline: Goal status: on-going   2.  Pt to be completing daily skin care to assist in preventing cellulitis  Baseline:  Goal status: on-going  3.  Pt to have lost 3 cm from Lt LE measurements Baseline:  Goal status: on-going   LONG TERM GOALS: Target date: 07/28/24  PT to be I in self manual techniques  Baseline:  Goal status: on-going  2.  Pt to understand that he should be wearing compression wear daily Baseline:  Goal status: on-going  3.  PT to have lost 4-5 cm from Lt LE daily to assist in preventing cellulitis  Baseline:  Goal status: on-going 4.  PT to have and be using a compression pump to assist in decreasing fluid in his LE                     Goal Status:  on going   PLAN:  PT FREQUENCY: 3x/week  PT DURATION: 6 weeks  PLANNED INTERVENTIONS: 97110-Therapeutic exercises, 97535- Self Care, and 02859- Manual therapy  PLAN FOR NEXT SESSION: Begin total decongestive techniques  Augustin Mclean, LPTA/CLT; [facility] [phone]  06/20/2024, 9:33 AM

## 2024-06-20 NOTE — Patient Instructions (Signed)
 Medication Instructions:  Your physician recommends that you continue on your current medications as directed. Please refer to the Current Medication list given to you today.   Labwork: None today  Testing/Procedures: None today  Follow-Up: 6 months  Any Other Special Instructions Will Be Listed Below (If Applicable).  If you need a refill on your cardiac medications before your next appointment, please call your pharmacy.

## 2024-06-20 NOTE — Progress Notes (Signed)
 Cardiology Office Note  Date: 06/20/2024   ID: Devin [name], Devin [name] 1945/12/26, MRN [id number]  History of Present Illness: Devin [name] is a 78 y.o. male last seen in May by Ms. Sheron RIGGERS, I reviewed her note.  He is here for a follow-up visit.  Interval chart reviewed.  He has had follow-up with Dr. Sheryle and is now starting PT for treatment of lymphedema.  He does not report any angina.  I reviewed his interval lab work which is noted below.  We went over his medications.  Demadex  currently at 20 mg twice daily, no change in other cardiac medications.  He is tolerating Lipitor  at 10 mg daily.  His last LDL was 79.  I reviewed his interval echocardiogram which is noted below, chest x-ray reported only basilar atelectasis.  Physical Exam: VS:  BP 135/62 (BP Location: Right Arm, Cuff Size: Large)   Pulse 74   Ht 6' (1.829 m)   Wt (!) 305 lb 6.4 oz (138.5 kg)   SpO2 94%   BMI 41.42 kg/m , BMI Body mass index is 41.42 kg/m.  Wt Readings from Last 3 Encounters:  06/20/24 (!) 305 lb 6.4 oz (138.5 kg)  03/06/24 (!) 304 lb (137.9 kg)  08/28/23 282 lb (127.9 kg)    General: Patient appears comfortable at rest. HEENT: Conjunctiva and lids normal. Neck: Supple, no elevated JVP. Lungs: Clear to auscultation, nonlabored breathing at rest. Cardiac: Regular rate and rhythm, no S3 or significant systolic murmur. Abdomen: Protuberant, bowel sounds present. Extremities: Bilateral lymphedema.  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  Labwork: March 2025: Cholesterol 136, triglycerides 120, HDL 35, LDL 79 01/03/2024: Hemoglobin 16.0 03/18/2024: BNP 45.9; BUN 19; Creatinine, Ser 1.36; Potassium 4.9; Sodium 141  June 2025: BUN 16, creatinine 1.2, potassium 4.9  Other Studies Reviewed Today:  Echocardiogram 04/08/2024:  1. Left ventricular ejection fraction, by  estimation, is 60 to 65%. The  left ventricle has normal function. The left ventricle has no regional  wall motion abnormalities. The left ventricular internal cavity size was  mildly dilated. There is mild  concentric left ventricular hypertrophy. Left ventricular diastolic  parameters are indeterminate.   2. Right ventricular systolic function is mildly reduced. The right  ventricular size is normal. Tricuspid regurgitation signal is inadequate  for assessing PA pressure.   3. The mitral valve is degenerative. Trivial mitral valve regurgitation.  Moderate mitral annular calcification.   4. The aortic valve is tricuspid. There is mild calcification of the  aortic valve. Aortic valve regurgitation is not visualized. Aortic valve  sclerosis/calcification is present, without any evidence of aortic  stenosis.   5. There is mild dilatation of the ascending aorta, measuring 41 mm.   6. The inferior vena cava is dilated in size with <50% respiratory  variability, suggesting right atrial pressure of 15 mmHg.   Assessment and Plan:  1.  Multivessel CAD status post CABG in March 2016 including LIMA to LAD, SVG to OM1, SVG to OM 2, and SVG to PDA.  Follow-up echocardiogram in June revealed LVEF 60 to 65%.  He does not report any active angina at current level of activity.  Continue aspirin  81 mg daily and Lipitor  10 mg daily,.   2.  Primary hypertension.  No change to current regimen.  He continues on Toprol -XL 25 mg daily.   3.  Mixed hyperlipidemia.  LDL 79 in March.  Tolerating Lipitor  10 mg daily.   4.  OSA, intolerant of CPAP.  5.  Bilateral lymphedema.  Currently undergoing PT.  He is on baseline diuretics, Demadex  20 mg twice daily.  Most effective management will be mechanical compression however.  Disposition:  Follow up 6 months.  Signed, Jayson JUDITHANN Sierras, M.D., F.A.C.C. [facility]

## 2024-06-23 ENCOUNTER — Encounter (HOSPITAL_COMMUNITY): Payer: Self-pay

## 2024-06-23 ENCOUNTER — Ambulatory Visit (HOSPITAL_COMMUNITY)

## 2024-06-23 DIAGNOSIS — M79672 Pain in left foot: Secondary | ICD-10-CM

## 2024-06-23 DIAGNOSIS — M6281 Muscle weakness (generalized): Secondary | ICD-10-CM

## 2024-06-23 DIAGNOSIS — R600 Localized edema: Secondary | ICD-10-CM

## 2024-06-23 DIAGNOSIS — R262 Difficulty in walking, not elsewhere classified: Secondary | ICD-10-CM

## 2024-06-23 DIAGNOSIS — I890 Lymphedema, not elsewhere classified: Secondary | ICD-10-CM

## 2024-06-23 NOTE — Therapy (Signed)
 OUTPATIENT PHYSICAL THERAPY LYMPHEDEMA Treatment  Patient Name: Devin [name] MRN: [id number] DOB:03/24/1946, 78 y.o., male Today's Date: 06/23/2024  END OF SESSION:  PT End of Session - 06/23/24 1110     Visit Number 4    Number of Visits 18    Date for PT Re-Evaluation 07/28/24    Authorization Type medicare    PT Start Time 1019    PT Stop Time 1105    PT Time Calculation (min) 46 min    Activity Tolerance Patient tolerated treatment well    Behavior During Therapy [facility] for tasks assessed/performed            Past Medical History:  Diagnosis Date   Arthritis    Asbestosis (HCC)    CAD (coronary artery disease)    Multivessel status post CABG March 2016 - LIMA to LAD, SVG to OM1, SVG to OM2, and SVG to PDA.   Essential hypertension    GERD (gastroesophageal reflux disease)    Melanoma of back (HCC)    Prostatic hypertrophy    Sleep apnea    Intolerant of CPAP   Past Surgical History:  Procedure Laterality Date   ANTERIOR CERVICAL DECOMP/DISCECTOMY FUSION     CARDIOVERSION N/A 01/28/2015   Procedure: CARDIOVERSION;  Surgeon: Dorn JULIANNA Ross, MD;  Location: [facility];  Service: Endoscopy;  Laterality: N/A;   CHOLECYSTECTOMY N/A 04/07/2020   Procedure: LAPAROSCOPIC CHOLECYSTECTOMY;  Surgeon: Kallie Manuelita BROCKS, MD;  Location: [facility];  Service: General;  Laterality: N/A;   CORONARY ARTERY BYPASS GRAFT N/A 12/29/2014   Procedure: CORONARY ARTERY BYPASS GRAFTING (CABG), ON PUMP, TIMES FOUR, USING LEFT INTERNAL MAMMARY ARTERY, RIGHT GREATER SAPHENOUS VEIN HARVESTED ENDOSCOPICALLY;  Surgeon: Maude Fleeta Ochoa, MD;  Location: [facility] OR;  Service: Open Heart Surgery;  Laterality: N/A;   KNEE ARTHROSCOPY Right    KNEE CARTILAGE SURGERY Left    LEFT HEART CATHETERIZATION WITH CORONARY ANGIOGRAM N/A 12/28/2014   Procedure: LEFT HEART CATHETERIZATION WITH CORONARY ANGIOGRAM;  Surgeon: Dorn JINNY Lesches, MD;  Location: [facility] CATH LAB;  Service: Cardiovascular;  Laterality: N/A;   MELANOMA  EXCISION     back   SHOULDER OPEN ROTATOR CUFF REPAIR Left    TEE WITHOUT CARDIOVERSION N/A 12/29/2014   Procedure: TRANSESOPHAGEAL ECHOCARDIOGRAM (TEE);  Surgeon: Maude Fleeta Ochoa, MD;  Location: [facility] OR;  Service: Open Heart Surgery;  Laterality: N/A;   TEE WITHOUT CARDIOVERSION N/A 01/28/2015   Procedure: TRANSESOPHAGEAL ECHOCARDIOGRAM (TEE);  Surgeon: Dorn JULIANNA Ross, MD;  Location: [facility];  Service: Endoscopy;  Laterality: N/A;   Patient Active Problem List   Diagnosis Date Noted   Cough variant asthma vs UACS  02/17/2022   DOE (dyspnea on exertion) 12/07/2020   Calculus of gallbladder with acute cholecystitis without obstruction 04/06/2020   Hypogonadism in male 10/17/2019   Nocturia 10/17/2019   Erectile dysfunction due to arterial insufficiency 10/17/2019   Atrial fibrillation and flutter (HCC) 01/23/2015   Palpitations 01/23/2015   Hyperlipidemia 01/23/2015   BPH with urinary obstruction 01/23/2015   Chronic diastolic CHF (congestive heart failure) /HFpEF 01/23/2015   Atrial fibrillation with RVR (HCC) 01/23/2015   Obstructive sleep apnea--- refuses CPAP 01/18/2015   Essential hypertension 01/18/2015   Leg edema 01/18/2015   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   CAD (coronary artery disease), native coronary artery/H/o CABG x 4 12/26/2014    PCP: Sheryle Carwin, MD  REFERRING PROVIDER: Sheryle Carwin, MD  REFERRING DIAG:  Free Text Diagnosis  right leg cellulitis    THERAPY  DIAG:  Edema most likely lymphedema causing cellulitis in both LE   Rationale for Evaluation and Treatment: Rehabilitation  ONSET DATE: chronic at least  since 2016  SUBJECTIVE:                                                                                                                                                                                           SUBJECTIVE STATEMENT:  Reports he has reduced the fluid pills and continues to urinate a lot.  Exercises and self manual going well at home.  Increase  ease donning socks/shoes with Rt LE.   PERTINENT HISTORY: leg edema, CAD,  PAIN:  Are you having pain? Yes NPRS scale: 3/10  Pain location: Lt foot  Pain orientation: Left  PAIN TYPE: itching and tightness  Pain description: intermittent  Aggravating factors: activity Relieving factors: mornings   PRECAUTIONS: Other: cellulitis   RED FLAGS: None   WEIGHT BEARING RESTRICTIONS: No  FALLS:  Has patient fallen in last 6 months? No  LIVING ENVIRONMENT: Lives with: lives with their spouse Lives in: House/apartment OCCUPATION: retired   LEISURE: walking   PRIOR LEVEL OF FUNCTION: Independent  PATIENT GOALS: less swelling no infection    OBJECTIVE: Note: Objective measures were completed at Evaluation unless otherwise noted.  COGNITION: Overall cognitive status: Within functional limits for tasks assessed   PALPATION: Noted induration   OBSERVATIONS / OTHER ASSESSMENTS: noted erythema ,positive stemmer sign   LYMPHEDEMA ASSESSMENTS:    LE LANDMARK RIGHT eval  At groin   30 cm proximal to suprapatella   20 cm proximal to suprapatella 67  10 cm proximal to suprapatella 57.8  At midpatella / popliteal crease 49.3  30 cm proximal to floor at lateral plantar foot 55  20 cm proximal to floor at lateral plantar foot 47.3  10 cm proximal to floor at lateral plantar foot 36.8  Circumference of ankle/heel 40.9  5 cm proximal to 1st MTP joint 29.7  Across MTP joint 28.9  Around proximal great toe 11  (Blank rows = not tested)  LE LANDMARK LEFT eval  At groin   30 cm proximal to suprapatella   20 cm proximal to suprapatella 64.8  10 cm proximal to suprapatella 58.7  At midpatella / popliteal crease 52.4  30 cm proximal to floor at lateral plantar foot 53.6  20 cm proximal to floor at lateral plantar foot 46.7  10 cm proximal to floor at lateral plantar foot 38.4  Circumference of ankle/heel 40.5  5 cm proximal to 1st MTP joint 29.4  Across MTP joint 28.6   Around proximal great toe 11.6  (Blank rows =  not tested)  FUNCTIONAL TESTS:  30 seconds chair stand test:  9 average for age is 11-15 2 minute walk test: 348 ft   TODAY'S TREATMENT:                                                                                                                              DATE:  06/23/24: Manual decongestive supraclavicular, short and deep abdominal, inguinal/axillary anastomosis and B LE anterior supine position and posterior in sidelying. Increased focus with distal extremity. 06/20/24: Manual decongestive supraclavicular, short and deep abdominal, inguinal/axillary anastomosis and B LE anterior supine position and posterior in sidelying.   06/18/24 Therapist cut foam for B LE.  Therapist then completed manual techniques to include supraclavicular, inguinal/axillary anastomosis and B LE.  Manual was completed B and completed until pt had decreased induration in LE>    Education details: 06/18/24:  self manual techniques  06/16/24 : HEP, what lymphedema is and how it is controlled,(four aspects to treatment) Person educated: Patient Education method: Explanation, Demonstration, Verbal cues, and Handouts Education comprehension: verbalized understanding  HOME EXERCISE PROGRAM: Access Code: [email or web address] URL: [email or web address] Date: 06/16/2024 Prepared by: Montie Metro  Exercises - Seated Diaphragmatic Breathing  - 1 x daily - 7 x weekly - 10 reps - 5 hold - Seated Cervical Sidebending AROM  - 1 x daily - 7 x weekly - 1 sets - 10 reps - 2-3 hold - Seated Cervical Rotation AROM  - 1 x daily - 7 x weekly - 1 sets - 10 reps - 2-3 hold - Seated Cervical Extension AROM  - 1 x daily - 7 x weekly - 1 sets - 10 reps - 2-3 hold - Seated Cervical Retraction  - 1 x daily - 7 x weekly - 1 sets - 10 reps - 2-3 hold - Shoulder Rolls in Sitting  - 1 x daily - 7 x weekly - 1 sets - 10 reps - 2-3 hold - Seated Sidebending Arms Overhead  - 1 x  daily - 7 x weekly - 1 sets - 10 reps - 2-3 hold - Seated Hip Abduction  - 1 x daily - 7 x weekly - 1 sets - 10 reps - 2-3 hold - Seated Long Arc Quad  - 1 x daily - 7 x weekly - 1 sets - 10 reps - 2-3 hold - Seated Heel Toe Raises  - 1 x daily - 7 x weekly - 1 sets - 10 reps - 2-3 hold - Seated Toe Curl  - 1 x daily - 7 x weekly - 1 sets - 10 reps - 2-3 hold  ASSESSMENT:  CLINICAL IMPRESSION:   Manual decongestive techniques anterior and posterior in sidelying with main focus on distal LE, induration and erythema present.  Reviewed self manual.  Pt has ordered short stretch and stated he received call indication pump has been order.    Pt arrived with isobands he received in mail, the other short  stretch has not arrived yet.  Isobands placed in bag with foam under cabinet.  Manual decongestive techniques anterior and posterior in sidelying with main focus on distal LE, induration and erythema present, increased focus with distal extremity.  Pt expects to receive the rest of short stretch bandages this week.  OBJECTIVE IMPAIRMENTS: difficulty walking, increased edema, pain, and decreased skin integrity .   ACTIVITY LIMITATIONS: dressing, hygiene/grooming, and locomotion level  REHAB POTENTIAL: Good  CLINICAL DECISION MAKING: Evolving/moderate complexity  EVALUATION COMPLEXITY: Moderate  GOALS: Goals reviewed with patient? No  SHORT TERM GOALS: Target date: 07/07/24  Pt to be I in HEP to improve lymphatic circulation  Baseline: Goal status: on-going   2.  Pt to be completing daily skin care to assist in preventing cellulitis  Baseline:  Goal status: on-going  3.  Pt to have lost 3 cm from Lt LE measurements Baseline:  Goal status: on-going   LONG TERM GOALS: Target date: 07/28/24  PT to be I in self manual techniques  Baseline:  Goal status: on-going  2.  Pt to understand that he should be wearing compression wear daily Baseline:  Goal status: on-going  3.  PT to  have lost 4-5 cm from Lt LE daily to assist in preventing cellulitis  Baseline:  Goal status: on-going 4.  PT to have and be using a compression pump to assist in decreasing fluid in his LE                     Goal Status:  on going   PLAN:  PT FREQUENCY: 3x/week  PT DURATION: 6 weeks  PLANNED INTERVENTIONS: 97110-Therapeutic exercises, 97535- Self Care, and 02859- Manual therapy  PLAN FOR NEXT SESSION: Begin total decongestive techniques.  Add knee high short stretch bandages when all received in mail, foam and isoband under cabinet in bag.  Augustin Mclean, LPTA/CLT; WILLAIM [phone]  06/23/2024, 12:30 PM

## 2024-06-25 ENCOUNTER — Ambulatory Visit (HOSPITAL_COMMUNITY): Admitting: Physical Therapy

## 2024-06-25 DIAGNOSIS — M79672 Pain in left foot: Secondary | ICD-10-CM | POA: Diagnosis not present

## 2024-06-25 DIAGNOSIS — R600 Localized edema: Secondary | ICD-10-CM

## 2024-06-25 DIAGNOSIS — I890 Lymphedema, not elsewhere classified: Secondary | ICD-10-CM

## 2024-06-25 DIAGNOSIS — M6281 Muscle weakness (generalized): Secondary | ICD-10-CM | POA: Diagnosis not present

## 2024-06-25 DIAGNOSIS — R262 Difficulty in walking, not elsewhere classified: Secondary | ICD-10-CM | POA: Diagnosis not present

## 2024-06-25 NOTE — Therapy (Signed)
 OUTPATIENT PHYSICAL THERAPY LYMPHEDEMA Treatment  Patient Name: Devin [name] MRN: [id number] DOB:11-11-1945, 78 y.o., male Today's Date: 06/25/2024  END OF SESSION:  PT End of Session - 06/25/24 1353     Visit Number 5    Number of Visits 18    Date for PT Re-Evaluation 07/28/24    Authorization Type medicare    PT Start Time 1115    PT Stop Time 1214    PT Time Calculation (min) 59 min    Activity Tolerance Patient tolerated treatment well    Behavior During Therapy [facility] for tasks assessed/performed             Past Medical History:  Diagnosis Date   Arthritis    Asbestosis (HCC)    CAD (coronary artery disease)    Multivessel status post CABG March 2016 - LIMA to LAD, SVG to OM1, SVG to OM2, and SVG to PDA.   Essential hypertension    GERD (gastroesophageal reflux disease)    Melanoma of back (HCC)    Prostatic hypertrophy    Sleep apnea    Intolerant of CPAP   Past Surgical History:  Procedure Laterality Date   ANTERIOR CERVICAL DECOMP/DISCECTOMY FUSION     CARDIOVERSION N/A 01/28/2015   Procedure: CARDIOVERSION;  Surgeon: Dorn JULIANNA Ross, MD;  Location: [facility];  Service: Endoscopy;  Laterality: N/A;   CHOLECYSTECTOMY N/A 04/07/2020   Procedure: LAPAROSCOPIC CHOLECYSTECTOMY;  Surgeon: Kallie Manuelita BROCKS, MD;  Location: [facility];  Service: General;  Laterality: N/A;   CORONARY ARTERY BYPASS GRAFT N/A 12/29/2014   Procedure: CORONARY ARTERY BYPASS GRAFTING (CABG), ON PUMP, TIMES FOUR, USING LEFT INTERNAL MAMMARY ARTERY, RIGHT GREATER SAPHENOUS VEIN HARVESTED ENDOSCOPICALLY;  Surgeon: Maude Fleeta Ochoa, MD;  Location: [facility] OR;  Service: Open Heart Surgery;  Laterality: N/A;   KNEE ARTHROSCOPY Right    KNEE CARTILAGE SURGERY Left    LEFT HEART CATHETERIZATION WITH CORONARY ANGIOGRAM N/A 12/28/2014   Procedure: LEFT HEART CATHETERIZATION WITH CORONARY ANGIOGRAM;  Surgeon: Dorn JINNY Lesches, MD;  Location: [facility] CATH LAB;  Service: Cardiovascular;  Laterality: N/A;   MELANOMA  EXCISION     back   SHOULDER OPEN ROTATOR CUFF REPAIR Left    TEE WITHOUT CARDIOVERSION N/A 12/29/2014   Procedure: TRANSESOPHAGEAL ECHOCARDIOGRAM (TEE);  Surgeon: Maude Fleeta Ochoa, MD;  Location: [facility] OR;  Service: Open Heart Surgery;  Laterality: N/A;   TEE WITHOUT CARDIOVERSION N/A 01/28/2015   Procedure: TRANSESOPHAGEAL ECHOCARDIOGRAM (TEE);  Surgeon: Dorn JULIANNA Ross, MD;  Location: [facility];  Service: Endoscopy;  Laterality: N/A;   Patient Active Problem List   Diagnosis Date Noted   Cough variant asthma vs UACS  02/17/2022   DOE (dyspnea on exertion) 12/07/2020   Calculus of gallbladder with acute cholecystitis without obstruction 04/06/2020   Hypogonadism in male 10/17/2019   Nocturia 10/17/2019   Erectile dysfunction due to arterial insufficiency 10/17/2019   Atrial fibrillation and flutter (HCC) 01/23/2015   Palpitations 01/23/2015   Hyperlipidemia 01/23/2015   BPH with urinary obstruction 01/23/2015   Chronic diastolic CHF (congestive heart failure) /HFpEF 01/23/2015   Atrial fibrillation with RVR (HCC) 01/23/2015   Obstructive sleep apnea--- refuses CPAP 01/18/2015   Essential hypertension 01/18/2015   Leg edema 01/18/2015   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   CAD (coronary artery disease), native coronary artery/H/o CABG x 4 12/26/2014    PCP: Sheryle Carwin, MD  REFERRING PROVIDER: Sheryle Carwin, MD  REFERRING DIAG:  Free Text Diagnosis  right leg cellulitis  THERAPY DIAG:  Edema most likely lymphedema causing cellulitis in both LE   Rationale for Evaluation and Treatment: Rehabilitation  ONSET DATE: chronic at least  since 2016  SUBJECTIVE:                                                                                                                                                                                           SUBJECTIVE STATEMENT:  PT pleased with results so far.  Able to don and doff shoes and socks with improved ease.  PERTINENT HISTORY: leg edema,  CAD,  PAIN:  Are you having pain? Yes NPRS scale: 3/10  Pain location: Lt foot  Pain orientation: Left  PAIN TYPE: itching and tightness  Pain description: intermittent  Aggravating factors: activity Relieving factors: mornings   PRECAUTIONS: Other: cellulitis   RED FLAGS: None   WEIGHT BEARING RESTRICTIONS: No  FALLS:  Has patient fallen in last 6 months? No  LIVING ENVIRONMENT: Lives with: lives with their spouse Lives in: House/apartment OCCUPATION: retired   LEISURE: walking   PRIOR LEVEL OF FUNCTION: Independent  PATIENT GOALS: less swelling no infection    OBJECTIVE: Note: Objective measures were completed at Evaluation unless otherwise noted.  COGNITION: Overall cognitive status: Within functional limits for tasks assessed   PALPATION: Noted induration   OBSERVATIONS / OTHER ASSESSMENTS: noted erythema ,positive stemmer sign   LYMPHEDEMA ASSESSMENTS:    LE LANDMARK RIGHT eval   At groin 06/16/24 06/25/24  30 cm proximal to suprapatella    20 cm proximal to suprapatella 67 65.3  10 cm proximal to suprapatella 57.8 57  At midpatella / popliteal crease 49.3 50.3  30 cm proximal to floor at lateral plantar foot 55 54.5  20 cm proximal to floor at lateral plantar foot 47.3 47.3  10 cm proximal to floor at lateral plantar foot 36.8 35.8  Circumference of ankle/heel 40.9 40.3  5 cm proximal to 1st MTP joint 29.7 30  Across MTP joint 28.9 28.8  Around proximal great toe 11 11  (Blank rows = not tested)  LE LANDMARK LEFT eval 06/25/24  At groin 06/16/24   30 cm proximal to suprapatella    20 cm proximal to suprapatella 64.8 64.8  10 cm proximal to suprapatella 58.7 58.7  At midpatella / popliteal crease 52.4 48.7  30 cm proximal to floor at lateral plantar foot 53.6 53.5  20 cm proximal to floor at lateral plantar foot 46.7 47  10 cm proximal to floor at lateral plantar foot 38.4 36.7  Circumference of ankle/heel 40.5 36.4  5 cm proximal to 1st MTP  joint 29.4 31.5  Across  MTP joint 28.6 29.5  Around proximal great toe 11.6 11.8  (Blank rows = not tested)  FUNCTIONAL TESTS:  30 seconds chair stand test:  9 average for age is 11-15 2 minute walk test: 348 ft   TODAY'S TREATMENT:                                                                                                                              DATE:  06/25/24:Measurement Manual decongestive techniques to include: supraclavicular, short and deep abdominal, inguinal/axillary anastomosis and B LE . Compression bandaging completed with borrowing short stretch,(2 -6 cm and 4- 10 cm ), from department using multilayer short stretch bandages with 1/2 foam.    Education details: 06/18/24:  self manual techniques  06/16/24 : HEP, what lymphedema is and how it is controlled,(four aspects to treatment) Person educated: Patient Education method: Explanation, Demonstration, Verbal cues, and Handouts Education comprehension: verbalized understanding  HOME EXERCISE PROGRAM: Access Code: [email or web address] URL: [email or web address] Date: 06/16/2024 Prepared by: Montie Metro  Exercises - Seated Diaphragmatic Breathing  - 1 x daily - 7 x weekly - 10 reps - 5 hold - Seated Cervical Sidebending AROM  - 1 x daily - 7 x weekly - 1 sets - 10 reps - 2-3 hold - Seated Cervical Rotation AROM  - 1 x daily - 7 x weekly - 1 sets - 10 reps - 2-3 hold - Seated Cervical Extension AROM  - 1 x daily - 7 x weekly - 1 sets - 10 reps - 2-3 hold - Seated Cervical Retraction  - 1 x daily - 7 x weekly - 1 sets - 10 reps - 2-3 hold - Shoulder Rolls in Sitting  - 1 x daily - 7 x weekly - 1 sets - 10 reps - 2-3 hold - Seated Sidebending Arms Overhead  - 1 x daily - 7 x weekly - 1 sets - 10 reps - 2-3 hold - Seated Hip Abduction  - 1 x daily - 7 x weekly - 1 sets - 10 reps - 2-3 hold - Seated Long Arc Quad  - 1 x daily - 7 x weekly - 1 sets - 10 reps - 2-3 hold - Seated Heel Toe Raises  - 1 x  daily - 7 x weekly - 1 sets - 10 reps - 2-3 hold - Seated Toe Curl  - 1 x daily - 7 x weekly - 1 sets - 10 reps - 2-3 hold  ASSESSMENT:  CLINICAL IMPRESSION:  Measured today with small reduction noted.  Therapist began compression bandaging borrowing short stretch from department.  Pt should receive the short stretch that he ordered today or tomorrow.   OBJECTIVE IMPAIRMENTS: difficulty walking, increased edema, pain, and decreased skin integrity .   ACTIVITY LIMITATIONS: dressing, hygiene/grooming, and locomotion level  REHAB POTENTIAL: Good  CLINICAL DECISION MAKING: Evolving/moderate complexity  EVALUATION COMPLEXITY: Moderate  GOALS: Goals reviewed with patient? No  SHORT TERM GOALS: Target date:  07/07/24  Pt to be I in HEP to improve lymphatic circulation  Baseline: Goal status: met   2.  Pt to be completing daily skin care to assist in preventing cellulitis  Baseline:  Goal status: met  3.  Pt to have lost 3 cm from Lt LE measurements Baseline:  Goal status: on-going   LONG TERM GOALS: Target date: 07/28/24  PT to be I in self manual techniques  Baseline:  Goal status: met  2.  Pt to understand that he should be wearing compression wear daily Baseline:  Goal status: on-going  3.  PT to have lost 4-5 cm from Lt LE daily to assist in preventing cellulitis  Baseline:  Goal status: on-going 4.  PT to have and be using a compression pump to assist in decreasing fluid in his LE                     Goal Status:  on going   PLAN:  PT FREQUENCY: 3x/week  PT DURATION: 6 weeks  PLANNED INTERVENTIONS: 97110-Therapeutic exercises, 97535- Self Care, and 02859- Manual therapy  PLAN FOR NEXT SESSION: continue total decongestive techniques.  Montie Metro, PT CLT  [phone]  06/25/2024, 2:01 PM

## 2024-06-26 ENCOUNTER — Encounter: Payer: Self-pay | Admitting: Podiatry

## 2024-06-26 ENCOUNTER — Ambulatory Visit (INDEPENDENT_AMBULATORY_CARE_PROVIDER_SITE_OTHER): Admitting: Podiatry

## 2024-06-26 DIAGNOSIS — L600 Ingrowing nail: Secondary | ICD-10-CM

## 2024-06-26 NOTE — Progress Notes (Unsigned)
 Chief Complaint  Patient presents with   Nail Problem    Follow up L great toe.  Feels much better one spot still has a pain  Redness and swelling. Some drainage  has been soaking.    HPI: 78 y.o. male presents today following left first toe PNA of bilateral borders, 05/15/2024.  He seems to be doing much better from previous visit.  Has been taking antibiotics without issue.  He is now seeing a lymphedema clinic for bilateral lower extremity swelling and has both lower extremities wrapped with compression dressings.  Denies significant pain to the left first toe PNA site, does still notice some scant drainage.  Past Medical History:  Diagnosis Date   Arthritis    Asbestosis (HCC)    CAD (coronary artery disease)    Multivessel status post CABG March 2016 - LIMA to LAD, SVG to OM1, SVG to OM2, and SVG to PDA.   Essential hypertension    GERD (gastroesophageal reflux disease)    Melanoma of back (HCC)    Prostatic hypertrophy    Sleep apnea    Intolerant of CPAP    Past Surgical History:  Procedure Laterality Date   ANTERIOR CERVICAL DECOMP/DISCECTOMY FUSION     CARDIOVERSION N/A 01/28/2015   Procedure: CARDIOVERSION;  Surgeon: Dorn JULIANNA Ross, MD;  Location: [facility];  Service: Endoscopy;  Laterality: N/A;   CHOLECYSTECTOMY N/A 04/07/2020   Procedure: LAPAROSCOPIC CHOLECYSTECTOMY;  Surgeon: Kallie Manuelita BROCKS, MD;  Location: [facility];  Service: General;  Laterality: N/A;   CORONARY ARTERY BYPASS GRAFT N/A 12/29/2014   Procedure: CORONARY ARTERY BYPASS GRAFTING (CABG), ON PUMP, TIMES FOUR, USING LEFT INTERNAL MAMMARY ARTERY, RIGHT GREATER SAPHENOUS VEIN HARVESTED ENDOSCOPICALLY;  Surgeon: Maude Fleeta Ochoa, MD;  Location: [facility] OR;  Service: Open Heart Surgery;  Laterality: N/A;   KNEE ARTHROSCOPY Right    KNEE CARTILAGE SURGERY Left    LEFT HEART CATHETERIZATION WITH CORONARY ANGIOGRAM N/A 12/28/2014   Procedure: LEFT HEART CATHETERIZATION WITH CORONARY ANGIOGRAM;  Surgeon: Dorn JINNY Lesches, MD;  Location: [facility] CATH LAB;  Service: Cardiovascular;  Laterality: N/A;   MELANOMA EXCISION     back   SHOULDER OPEN ROTATOR CUFF REPAIR Left    TEE WITHOUT CARDIOVERSION N/A 12/29/2014   Procedure: TRANSESOPHAGEAL ECHOCARDIOGRAM (TEE);  Surgeon: Maude Fleeta Ochoa, MD;  Location: [facility] OR;  Service: Open Heart Surgery;  Laterality: N/A;   TEE WITHOUT CARDIOVERSION N/A 01/28/2015   Procedure: TRANSESOPHAGEAL ECHOCARDIOGRAM (TEE);  Surgeon: Dorn JULIANNA Ross, MD;  Location: [facility];  Service: Endoscopy;  Laterality: N/A;    Allergies  Allergen Reactions   Lasix  [Furosemide ] Nausea Only    ROS    Physical Exam: There were no vitals filed for this visit.  General: The patient is alert and oriented x3 in no acute distress.  Dermatology: Left first toe medial lateral border PNA sites with granulation tissue to the wound beds.  No signs of infection today.  Scant minimal drainage predominantly serous noted to the lateral border.  Medial border stable scab present.  No surrounding erythema.  Generalized edema present consistent with bilateral lower extremities.  Vascular: Palpable pedal pulses bilaterally. Capillary refill within normal limits.  Does have compression edema wraps applied bilaterally.  Neurological: Light touch sensation grossly intact bilateral feet.   Musculoskeletal Exam: No pedal deformities noted.  Muscle strength 5/5 all major muscle groups.  Pes planus foot type.  Assessment/Plan of Care: 1. Ingrown left big toenail  No orders of the defined types were placed in this encounter.  None  Discussed clinical findings with patient today.  PNA site left hallux bilateral borders seem to be doing much better following completion of antibiotics.  At this point he can allow the toe to air dry more at home.  Continue use of the antibiotic drops and keep covered with bandage, and shoes.  Do think that the edema may be contributing to some of the persistent  drainage.  Follow-up as needed if area continues to be slow to heal and has not seemed to resolve in the next 2 to 3 weeks.   Journei Thomassen L. Lamount MAUL, AACFAS [facility]     [address] [place], [place] [address]                Office [phone]  Fax [phone]

## 2024-06-27 ENCOUNTER — Encounter (HOSPITAL_COMMUNITY): Payer: Self-pay

## 2024-06-27 ENCOUNTER — Ambulatory Visit (HOSPITAL_COMMUNITY)

## 2024-06-27 DIAGNOSIS — R262 Difficulty in walking, not elsewhere classified: Secondary | ICD-10-CM

## 2024-06-27 DIAGNOSIS — M6281 Muscle weakness (generalized): Secondary | ICD-10-CM | POA: Diagnosis not present

## 2024-06-27 DIAGNOSIS — R600 Localized edema: Secondary | ICD-10-CM

## 2024-06-27 DIAGNOSIS — M79672 Pain in left foot: Secondary | ICD-10-CM | POA: Diagnosis not present

## 2024-06-27 DIAGNOSIS — I890 Lymphedema, not elsewhere classified: Secondary | ICD-10-CM

## 2024-06-27 NOTE — Therapy (Signed)
 OUTPATIENT PHYSICAL THERAPY LYMPHEDEMA Treatment  Patient Name: Sumit Branham MRN: [id number] DOB:Mar 10, 1946, 78 y.o., male Today's Date: 06/27/2024  END OF SESSION:  PT End of Session - 06/27/24 0921     Visit Number 6    Number of Visits 18    Date for PT Re-Evaluation 07/28/24    Authorization Type medicare    PT Start Time 0922    PT Stop Time 1018    PT Time Calculation (min) 56 min    Activity Tolerance Patient tolerated treatment well    Behavior During Therapy [facility] for tasks assessed/performed             Past Medical History:  Diagnosis Date   Arthritis    Asbestosis (HCC)    CAD (coronary artery disease)    Multivessel status post CABG March 2016 - LIMA to LAD, SVG to OM1, SVG to OM2, and SVG to PDA.   Essential hypertension    GERD (gastroesophageal reflux disease)    Melanoma of back (HCC)    Prostatic hypertrophy    Sleep apnea    Intolerant of CPAP   Past Surgical History:  Procedure Laterality Date   ANTERIOR CERVICAL DECOMP/DISCECTOMY FUSION     CARDIOVERSION N/A 01/28/2015   Procedure: CARDIOVERSION;  Surgeon: Dorn JULIANNA Ross, MD;  Location: [facility];  Service: Endoscopy;  Laterality: N/A;   CHOLECYSTECTOMY N/A 04/07/2020   Procedure: LAPAROSCOPIC CHOLECYSTECTOMY;  Surgeon: Kallie Manuelita BROCKS, MD;  Location: [facility];  Service: General;  Laterality: N/A;   CORONARY ARTERY BYPASS GRAFT N/A 12/29/2014   Procedure: CORONARY ARTERY BYPASS GRAFTING (CABG), ON PUMP, TIMES FOUR, USING LEFT INTERNAL MAMMARY ARTERY, RIGHT GREATER SAPHENOUS VEIN HARVESTED ENDOSCOPICALLY;  Surgeon: Maude Fleeta Ochoa, MD;  Location: [facility] OR;  Service: Open Heart Surgery;  Laterality: N/A;   KNEE ARTHROSCOPY Right    KNEE CARTILAGE SURGERY Left    LEFT HEART CATHETERIZATION WITH CORONARY ANGIOGRAM N/A 12/28/2014   Procedure: LEFT HEART CATHETERIZATION WITH CORONARY ANGIOGRAM;  Surgeon: Dorn JINNY Lesches, MD;  Location: [facility] CATH LAB;  Service: Cardiovascular;  Laterality: N/A;   MELANOMA  EXCISION     back   SHOULDER OPEN ROTATOR CUFF REPAIR Left    TEE WITHOUT CARDIOVERSION N/A 12/29/2014   Procedure: TRANSESOPHAGEAL ECHOCARDIOGRAM (TEE);  Surgeon: Maude Fleeta Ochoa, MD;  Location: [facility] OR;  Service: Open Heart Surgery;  Laterality: N/A;   TEE WITHOUT CARDIOVERSION N/A 01/28/2015   Procedure: TRANSESOPHAGEAL ECHOCARDIOGRAM (TEE);  Surgeon: Dorn JULIANNA Ross, MD;  Location: [facility];  Service: Endoscopy;  Laterality: N/A;   Patient Active Problem List   Diagnosis Date Noted   Cough variant asthma vs UACS  02/17/2022   DOE (dyspnea on exertion) 12/07/2020   Calculus of gallbladder with acute cholecystitis without obstruction 04/06/2020   Hypogonadism in male 10/17/2019   Nocturia 10/17/2019   Erectile dysfunction due to arterial insufficiency 10/17/2019   Atrial fibrillation and flutter (HCC) 01/23/2015   Palpitations 01/23/2015   Hyperlipidemia 01/23/2015   BPH with urinary obstruction 01/23/2015   Chronic diastolic CHF (congestive heart failure) /HFpEF 01/23/2015   Atrial fibrillation with RVR (HCC) 01/23/2015   Obstructive sleep apnea--- refuses CPAP 01/18/2015   Essential hypertension 01/18/2015   Leg edema 01/18/2015   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   CAD (coronary artery disease), native coronary artery/H/o CABG x 4 12/26/2014    PCP: Sheryle Carwin, MD  REFERRING PROVIDER: Sheryle Carwin, MD  REFERRING DIAG:  Free Text Diagnosis  right leg cellulitis  THERAPY DIAG:  Edema most likely lymphedema causing cellulitis in both LE   Rationale for Evaluation and Treatment: Rehabilitation  ONSET DATE: chronic at least  since 2016  SUBJECTIVE:                                                                                                                                                                                           SUBJECTIVE STATEMENT:  Had to remove toe wraps early, cutting into toes.  Removed bandages and showered this morning.  Stated he could tell legs were  smaller upon removal of bandages.  Stated getting easier to donn socks and shoes.  Pt brought short stretch that arrived in mail.  PERTINENT HISTORY: leg edema, CAD,  PAIN:  Are you having pain? Yes NPRS scale: 0-5/10  Pain location: knees  Pain orientation: Left  PAIN TYPE: itching and tightness  Pain description: intermittent  Aggravating factors: activity Relieving factors: mornings   PRECAUTIONS: Other: cellulitis   RED FLAGS: None   WEIGHT BEARING RESTRICTIONS: No  FALLS:  Has patient fallen in last 6 months? No  LIVING ENVIRONMENT: Lives with: lives with their spouse Lives in: House/apartment OCCUPATION: retired   LEISURE: walking   PRIOR LEVEL OF FUNCTION: Independent  PATIENT GOALS: less swelling no infection    OBJECTIVE: Note: Objective measures were completed at Evaluation unless otherwise noted.  COGNITION: Overall cognitive status: Within functional limits for tasks assessed   PALPATION: Noted induration   OBSERVATIONS / OTHER ASSESSMENTS: noted erythema ,positive stemmer sign   LYMPHEDEMA ASSESSMENTS:    LE LANDMARK RIGHT eval   At groin 06/16/24 06/25/24  30 cm proximal to suprapatella    20 cm proximal to suprapatella 67 65.3  10 cm proximal to suprapatella 57.8 57  At midpatella / popliteal crease 49.3 50.3  30 cm proximal to floor at lateral plantar foot 55 54.5  20 cm proximal to floor at lateral plantar foot 47.3 47.3  10 cm proximal to floor at lateral plantar foot 36.8 35.8  Circumference of ankle/heel 40.9 40.3  5 cm proximal to 1st MTP joint 29.7 30  Across MTP joint 28.9 28.8  Around proximal great toe 11 11  (Blank rows = not tested)  LE LANDMARK LEFT eval 06/25/24  At groin 06/16/24   30 cm proximal to suprapatella    20 cm proximal to suprapatella 64.8 64.8  10 cm proximal to suprapatella 58.7 58.7  At midpatella / popliteal crease 52.4 48.7  30 cm proximal to floor at lateral plantar foot 53.6 53.5  20 cm proximal to  floor at lateral plantar foot 46.7 47  10 cm  proximal to floor at lateral plantar foot 38.4 36.7  Circumference of ankle/heel 40.5 36.4  5 cm proximal to 1st MTP joint 29.4 31.5  Across MTP joint 28.6 29.5  Around proximal great toe 11.6 11.8  (Blank rows = not tested)  FUNCTIONAL TESTS:  30 seconds chair stand test:  9 average for age is 11-15 2 minute walk test: 348 ft   TODAY'S TREATMENT:                                                                                                                              DATE:  829/25: Manual decongestive techniques to include: supraclavicular, short and deep abdominal, inguinal/axillary anastomosis and B LE.  Anterior only. Application of multilayer short stretch bandages with 1/2in foam (2- 6cm and 6-10cm)  06/25/24:Measurement Manual decongestive techniques to include: supraclavicular, short and deep abdominal, inguinal/axillary anastomosis and B LE . Compression bandaging completed with borrowing short stretch,(2 -6 cm and 4- 10 cm ), from department using multilayer short stretch bandages with 1/2 foam.    Education details: 06/18/24:  self manual techniques  06/16/24 : HEP, what lymphedema is and how it is controlled,(four aspects to treatment) Person educated: Patient Education method: Explanation, Demonstration, Verbal cues, and Handouts Education comprehension: verbalized understanding  HOME EXERCISE PROGRAM: Access Code: [email or web address] URL: [email or web address] Date: 06/16/2024 Prepared by: Montie Metro  Exercises - Seated Diaphragmatic Breathing  - 1 x daily - 7 x weekly - 10 reps - 5 hold - Seated Cervical Sidebending AROM  - 1 x daily - 7 x weekly - 1 sets - 10 reps - 2-3 hold - Seated Cervical Rotation AROM  - 1 x daily - 7 x weekly - 1 sets - 10 reps - 2-3 hold - Seated Cervical Extension AROM  - 1 x daily - 7 x weekly - 1 sets - 10 reps - 2-3 hold - Seated Cervical Retraction  - 1 x daily - 7 x weekly -  1 sets - 10 reps - 2-3 hold - Shoulder Rolls in Sitting  - 1 x daily - 7 x weekly - 1 sets - 10 reps - 2-3 hold - Seated Sidebending Arms Overhead  - 1 x daily - 7 x weekly - 1 sets - 10 reps - 2-3 hold - Seated Hip Abduction  - 1 x daily - 7 x weekly - 1 sets - 10 reps - 2-3 hold - Seated Long Arc Quad  - 1 x daily - 7 x weekly - 1 sets - 10 reps - 2-3 hold - Seated Heel Toe Raises  - 1 x daily - 7 x weekly - 1 sets - 10 reps - 2-3 hold - Seated Toe Curl  - 1 x daily - 7 x weekly - 1 sets - 10 reps - 2-3 hold  ASSESSMENT:  CLINICAL IMPRESSION:   Manual lymph decongestive techniques complete anterior only, some reduce in induration noted though still present, main focus with  distal extremities.  PT brought in new short stretch this session.  Heavy layer of lotion prior application of multilayer short stretch bandages with 1/2in foam.  Pt able to donn shoes with use of shoe horn.  OBJECTIVE IMPAIRMENTS: difficulty walking, increased edema, pain, and decreased skin integrity .   ACTIVITY LIMITATIONS: dressing, hygiene/grooming, and locomotion level  REHAB POTENTIAL: Good  CLINICAL DECISION MAKING: Evolving/moderate complexity  EVALUATION COMPLEXITY: Moderate  GOALS: Goals reviewed with patient? No  SHORT TERM GOALS: Target date: 07/07/24  Pt to be I in HEP to improve lymphatic circulation  Baseline: Goal status: met   2.  Pt to be completing daily skin care to assist in preventing cellulitis  Baseline:  Goal status: met  3.  Pt to have lost 3 cm from Lt LE measurements Baseline:  Goal status: on-going   LONG TERM GOALS: Target date: 07/28/24  PT to be I in self manual techniques  Baseline:  Goal status: met  2.  Pt to understand that he should be wearing compression wear daily Baseline:  Goal status: on-going  3.  PT to have lost 4-5 cm from Lt LE daily to assist in preventing cellulitis  Baseline:  Goal status: on-going 4.  PT to have and be using a  compression pump to assist in decreasing fluid in his LE                     Goal Status:  on going   PLAN:  PT FREQUENCY: 3x/week  PT DURATION: 6 weeks  PLANNED INTERVENTIONS: 97110-Therapeutic exercises, 97535- Self Care, and 02859- Manual therapy  PLAN FOR NEXT SESSION: continue total decongestive techniques.   Augustin Mclean, LPTA/CLT; WILLAIM [phone]   06/27/2024, 10:18 AM

## 2024-07-02 ENCOUNTER — Ambulatory Visit (HOSPITAL_COMMUNITY): Attending: Internal Medicine | Admitting: Physical Therapy

## 2024-07-02 DIAGNOSIS — R600 Localized edema: Secondary | ICD-10-CM | POA: Insufficient documentation

## 2024-07-02 DIAGNOSIS — R262 Difficulty in walking, not elsewhere classified: Secondary | ICD-10-CM | POA: Diagnosis not present

## 2024-07-02 DIAGNOSIS — M6281 Muscle weakness (generalized): Secondary | ICD-10-CM | POA: Diagnosis not present

## 2024-07-02 DIAGNOSIS — I890 Lymphedema, not elsewhere classified: Secondary | ICD-10-CM | POA: Insufficient documentation

## 2024-07-02 DIAGNOSIS — M79672 Pain in left foot: Secondary | ICD-10-CM | POA: Insufficient documentation

## 2024-07-02 NOTE — Therapy (Signed)
 OUTPATIENT PHYSICAL THERAPY LYMPHEDEMA Treatment  Patient Name: Devin [name] MRN: [id number] DOB:25-Sep-1946, 78 y.o., male Today's Date: 07/02/2024  END OF SESSION:  PT End of Session - 07/02/24 1428     Visit Number 7    Number of Visits 18    Date for PT Re-Evaluation 07/28/24    Authorization Type medicare    PT Start Time 1020    PT Stop Time 1118    PT Time Calculation (min) 58 min    Activity Tolerance Patient tolerated treatment well    Behavior During Therapy [facility] for tasks assessed/performed              Past Medical History:  Diagnosis Date   Arthritis    Asbestosis (HCC)    CAD (coronary artery disease)    Multivessel status post CABG March 2016 - LIMA to LAD, SVG to OM1, SVG to OM2, and SVG to PDA.   Essential hypertension    GERD (gastroesophageal reflux disease)    Melanoma of back (HCC)    Prostatic hypertrophy    Sleep apnea    Intolerant of CPAP   Past Surgical History:  Procedure Laterality Date   ANTERIOR CERVICAL DECOMP/DISCECTOMY FUSION     CARDIOVERSION N/A 01/28/2015   Procedure: CARDIOVERSION;  Surgeon: Dorn JULIANNA Ross, MD;  Location: [facility];  Service: Endoscopy;  Laterality: N/A;   CHOLECYSTECTOMY N/A 04/07/2020   Procedure: LAPAROSCOPIC CHOLECYSTECTOMY;  Surgeon: Kallie Manuelita BROCKS, MD;  Location: [facility];  Service: General;  Laterality: N/A;   CORONARY ARTERY BYPASS GRAFT N/A 12/29/2014   Procedure: CORONARY ARTERY BYPASS GRAFTING (CABG), ON PUMP, TIMES FOUR, USING LEFT INTERNAL MAMMARY ARTERY, RIGHT GREATER SAPHENOUS VEIN HARVESTED ENDOSCOPICALLY;  Surgeon: Maude Fleeta Ochoa, MD;  Location: [facility] OR;  Service: Open Heart Surgery;  Laterality: N/A;   KNEE ARTHROSCOPY Right    KNEE CARTILAGE SURGERY Left    LEFT HEART CATHETERIZATION WITH CORONARY ANGIOGRAM N/A 12/28/2014   Procedure: LEFT HEART CATHETERIZATION WITH CORONARY ANGIOGRAM;  Surgeon: Dorn JINNY Lesches, MD;  Location: [facility] CATH LAB;  Service: Cardiovascular;  Laterality: N/A;   MELANOMA  EXCISION     back   SHOULDER OPEN ROTATOR CUFF REPAIR Left    TEE WITHOUT CARDIOVERSION N/A 12/29/2014   Procedure: TRANSESOPHAGEAL ECHOCARDIOGRAM (TEE);  Surgeon: Maude Fleeta Ochoa, MD;  Location: [facility] OR;  Service: Open Heart Surgery;  Laterality: N/A;   TEE WITHOUT CARDIOVERSION N/A 01/28/2015   Procedure: TRANSESOPHAGEAL ECHOCARDIOGRAM (TEE);  Surgeon: Dorn JULIANNA Ross, MD;  Location: [facility];  Service: Endoscopy;  Laterality: N/A;   Patient Active Problem List   Diagnosis Date Noted   Cough variant asthma vs UACS  02/17/2022   DOE (dyspnea on exertion) 12/07/2020   Calculus of gallbladder with acute cholecystitis without obstruction 04/06/2020   Hypogonadism in male 10/17/2019   Nocturia 10/17/2019   Erectile dysfunction due to arterial insufficiency 10/17/2019   Atrial fibrillation and flutter (HCC) 01/23/2015   Palpitations 01/23/2015   Hyperlipidemia 01/23/2015   BPH with urinary obstruction 01/23/2015   Chronic diastolic CHF (congestive heart failure) /HFpEF 01/23/2015   Atrial fibrillation with RVR (HCC) 01/23/2015   Obstructive sleep apnea--- refuses CPAP 01/18/2015   Essential hypertension 01/18/2015   Leg edema 01/18/2015   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   CAD (coronary artery disease), native coronary artery/H/o CABG x 4 12/26/2014    PCP: Sheryle Carwin, MD  REFERRING PROVIDER: Sheryle Carwin, MD  REFERRING DIAG:  Free Text Diagnosis  right leg cellulitis  THERAPY DIAG:  Edema most likely lymphedema causing cellulitis in both LE   Rationale for Evaluation and Treatment: Rehabilitation  ONSET DATE: chronic at least  since 2016  SUBJECTIVE:                                                                                                                                                                                           SUBJECTIVE STATEMENT:  Pt states that he is amazed how small his legs are.   PERTINENT HISTORY: leg edema, CAD,  PAIN:  Are you having pain?  Yes NPRS scale: 0-5/10  Pain location: knees  Pain orientation: Left  PAIN TYPE: itching and tightness  Pain description: intermittent  Aggravating factors: activity Relieving factors: mornings   PRECAUTIONS: Other: cellulitis   RED FLAGS: None   WEIGHT BEARING RESTRICTIONS: No  FALLS:  Has patient fallen in last 6 months? No  LIVING ENVIRONMENT: Lives with: lives with their spouse Lives in: House/apartment OCCUPATION: retired   LEISURE: walking   PRIOR LEVEL OF FUNCTION: Independent  PATIENT GOALS: less swelling no infection    OBJECTIVE: Note: Objective measures were completed at Evaluation unless otherwise noted.  COGNITION: Overall cognitive status: Within functional limits for tasks assessed   PALPATION: Noted induration   OBSERVATIONS / OTHER ASSESSMENTS: noted erythema ,positive stemmer sign   LYMPHEDEMA ASSESSMENTS:    LE LANDMARK RIGHT eval    At groin 06/16/24 06/25/24 07/02/24  30 cm proximal to suprapatella     20 cm proximal to suprapatella 67 65.3 65.3   10 cm proximal to suprapatella 57.8 57 55  At midpatella / popliteal crease 49.3 50.3 49  30 cm proximal to floor at lateral plantar foot 55 54.5 48  20 cm proximal to floor at lateral plantar foot 47.3 47.3 37.5  10 cm proximal to floor at lateral plantar foot 36.8 35.8 30.8  Circumference of ankle/heel 40.9 40.3 39.3  5 cm proximal to 1st MTP joint 29.7 30 30.5  Across MTP joint 28.9 28.8 29  Around proximal great toe 11 11 10.5  (Blank rows = not tested)  LE LANDMARK LEFT eval 06/25/24 07/02/24  At groin 06/16/24    30 cm proximal to suprapatella     20 cm proximal to suprapatella 64.8 64.8 62.8  10 cm proximal to suprapatella 58.7 58.7 56.5  At midpatella / popliteal crease 52.4 48.7 49.8  30 cm proximal to floor at lateral plantar foot 53.6 53.5 46.5  20 cm proximal to floor at lateral plantar foot 46.7 47 38  10 cm proximal to floor at lateral plantar foot 38.4 36.7 32  Circumference  of ankle/heel 40.5 36.4 38.2  5 cm proximal to 1st MTP joint 29.4 31.5 31  Across MTP joint 28.6 29.5 29.5  Around proximal great toe 11.6 11.8 11.5  (Blank rows = not tested)  FUNCTIONAL TESTS:  30 seconds chair stand test:  9 average for age is 11-15 2 minute walk test: 348 ft   TODAY'S TREATMENT:                                                                                                                              DATE:  07/02/24:pt measured see above:  Manual decongestive techniques to include: supraclavicular, short and deep abdominal, inguinal/axillary anastomosis and B LE.  Anterior only. Education of wife as to how to Apply multilayer short stretch bandages with 1/2in foam   Education details: 07/02/24:  compression bandaging using 1/2 foam and multiple short stretch bandages.    06/18/24:  self manual techniques  06/16/24 : HEP, what lymphedema is and how it is controlled,(four aspects to treatment) Person educated: Patient Education method: Explanation, Demonstration, Verbal cues, and Handouts Education comprehension: verbalized understanding  HOME EXERCISE PROGRAM: Access Code: [email or web address] URL: [email or web address] Date: 06/16/2024 Prepared by: Montie Metro  Exercises - Seated Diaphragmatic Breathing  - 1 x daily - 7 x weekly - 10 reps - 5 hold - Seated Cervical Sidebending AROM  - 1 x daily - 7 x weekly - 1 sets - 10 reps - 2-3 hold - Seated Cervical Rotation AROM  - 1 x daily - 7 x weekly - 1 sets - 10 reps - 2-3 hold - Seated Cervical Extension AROM  - 1 x daily - 7 x weekly - 1 sets - 10 reps - 2-3 hold - Seated Cervical Retraction  - 1 x daily - 7 x weekly - 1 sets - 10 reps - 2-3 hold - Shoulder Rolls in Sitting  - 1 x daily - 7 x weekly - 1 sets - 10 reps - 2-3 hold - Seated Sidebending Arms Overhead  - 1 x daily - 7 x weekly - 1 sets - 10 reps - 2-3 hold - Seated Hip Abduction  - 1 x daily - 7 x weekly - 1 sets - 10 reps - 2-3 hold -  Seated Long Arc Quad  - 1 x daily - 7 x weekly - 1 sets - 10 reps - 2-3 hold - Seated Heel Toe Raises  - 1 x daily - 7 x weekly - 1 sets - 10 reps - 2-3 hold - Seated Toe Curl  - 1 x daily - 7 x weekly - 1 sets - 10 reps - 2-3 hold  ASSESSMENT:  CLINICAL IMPRESSION:  Pt has reduced significantly.  Pt states he is unable to don compression garment due to shoulder and hand issues.  PT will benefit from juxtafit garment for maintenance phase of therapy.   Wife instructed in compression bandaging with good carry over.  OBJECTIVE IMPAIRMENTS: difficulty walking,  increased edema, pain, and decreased skin integrity .   ACTIVITY LIMITATIONS: dressing, hygiene/grooming, and locomotion level  REHAB POTENTIAL: Good  CLINICAL DECISION MAKING: Evolving/moderate complexity  EVALUATION COMPLEXITY: Moderate  GOALS: Goals reviewed with patient? No  SHORT TERM GOALS: Target date: 07/07/24  Pt to be I in HEP to improve lymphatic circulation  Baseline: Goal status: met   2.  Pt to be completing daily skin care to assist in preventing cellulitis  Baseline:  Goal status: met  3.  Pt to have lost 3 cm from Lt LE measurements Baseline:  Goal status: partially met    LONG TERM GOALS: Target date: 07/28/24  PT to be I in self manual techniques  Baseline:  Goal status: met  2.  Pt to understand that he should be wearing compression wear daily Baseline:  Goal status: on-going  3.  PT to have lost 4-5 cm from Lt LE daily to assist in preventing cellulitis  Baseline:  Goal status: on-going 4.  PT to have and be using a compression pump to assist in decreasing fluid in his LE                     Goal Status:  on going   PLAN:  PT FREQUENCY: 3x/week  PT DURATION: 6 weeks  PLANNED INTERVENTIONS: 97110-Therapeutic exercises, 97535- Self Care, and 02859- Manual therapy  PLAN FOR NEXT SESSION: continue total decongestive techniques.  Montie Metro, PT CLT [phone]  07/02/2024, 2:28 PM

## 2024-07-04 ENCOUNTER — Ambulatory Visit (HOSPITAL_COMMUNITY): Admitting: Physical Therapy

## 2024-07-04 DIAGNOSIS — R262 Difficulty in walking, not elsewhere classified: Secondary | ICD-10-CM | POA: Diagnosis not present

## 2024-07-04 DIAGNOSIS — I890 Lymphedema, not elsewhere classified: Secondary | ICD-10-CM

## 2024-07-04 DIAGNOSIS — M6281 Muscle weakness (generalized): Secondary | ICD-10-CM | POA: Diagnosis not present

## 2024-07-04 DIAGNOSIS — M79672 Pain in left foot: Secondary | ICD-10-CM | POA: Diagnosis not present

## 2024-07-04 DIAGNOSIS — R600 Localized edema: Secondary | ICD-10-CM | POA: Diagnosis not present

## 2024-07-04 NOTE — Therapy (Signed)
 OUTPATIENT PHYSICAL THERAPY LYMPHEDEMA Treatment  Patient Name: Devin [name] MRN: [id number] DOB:12/18/1945, 78 y.o., male Today's Date: 07/04/2024  END OF SESSION:  PT End of Session - 07/04/24 1111     Visit Number 8    Number of Visits 18    Date for PT Re-Evaluation 07/28/24    Authorization Type medicare    PT Start Time 1016    PT Stop Time 1110    PT Time Calculation (min) 54 min    Activity Tolerance Patient tolerated treatment well    Behavior During Therapy [facility] for tasks assessed/performed              Past Medical History:  Diagnosis Date   Arthritis    Asbestosis (HCC)    CAD (coronary artery disease)    Multivessel status post CABG March 2016 - LIMA to LAD, SVG to OM1, SVG to OM2, and SVG to PDA.   Essential hypertension    GERD (gastroesophageal reflux disease)    Melanoma of back (HCC)    Prostatic hypertrophy    Sleep apnea    Intolerant of CPAP   Past Surgical History:  Procedure Laterality Date   ANTERIOR CERVICAL DECOMP/DISCECTOMY FUSION     CARDIOVERSION N/A 01/28/2015   Procedure: CARDIOVERSION;  Surgeon: Dorn JULIANNA Ross, MD;  Location: [facility];  Service: Endoscopy;  Laterality: N/A;   CHOLECYSTECTOMY N/A 04/07/2020   Procedure: LAPAROSCOPIC CHOLECYSTECTOMY;  Surgeon: Kallie Manuelita BROCKS, MD;  Location: [facility];  Service: General;  Laterality: N/A;   CORONARY ARTERY BYPASS GRAFT N/A 12/29/2014   Procedure: CORONARY ARTERY BYPASS GRAFTING (CABG), ON PUMP, TIMES FOUR, USING LEFT INTERNAL MAMMARY ARTERY, RIGHT GREATER SAPHENOUS VEIN HARVESTED ENDOSCOPICALLY;  Surgeon: Maude Fleeta Ochoa, MD;  Location: [facility] OR;  Service: Open Heart Surgery;  Laterality: N/A;   KNEE ARTHROSCOPY Right    KNEE CARTILAGE SURGERY Left    LEFT HEART CATHETERIZATION WITH CORONARY ANGIOGRAM N/A 12/28/2014   Procedure: LEFT HEART CATHETERIZATION WITH CORONARY ANGIOGRAM;  Surgeon: Dorn JINNY Lesches, MD;  Location: [facility] CATH LAB;  Service: Cardiovascular;  Laterality: N/A;   MELANOMA  EXCISION     back   SHOULDER OPEN ROTATOR CUFF REPAIR Left    TEE WITHOUT CARDIOVERSION N/A 12/29/2014   Procedure: TRANSESOPHAGEAL ECHOCARDIOGRAM (TEE);  Surgeon: Maude Fleeta Ochoa, MD;  Location: [facility] OR;  Service: Open Heart Surgery;  Laterality: N/A;   TEE WITHOUT CARDIOVERSION N/A 01/28/2015   Procedure: TRANSESOPHAGEAL ECHOCARDIOGRAM (TEE);  Surgeon: Dorn JULIANNA Ross, MD;  Location: [facility];  Service: Endoscopy;  Laterality: N/A;   Patient Active Problem List   Diagnosis Date Noted   Cough variant asthma vs UACS  02/17/2022   DOE (dyspnea on exertion) 12/07/2020   Calculus of gallbladder with acute cholecystitis without obstruction 04/06/2020   Hypogonadism in male 10/17/2019   Nocturia 10/17/2019   Erectile dysfunction due to arterial insufficiency 10/17/2019   Atrial fibrillation and flutter (HCC) 01/23/2015   Palpitations 01/23/2015   Hyperlipidemia 01/23/2015   BPH with urinary obstruction 01/23/2015   Chronic diastolic CHF (congestive heart failure) /HFpEF 01/23/2015   Atrial fibrillation with RVR (HCC) 01/23/2015   Obstructive sleep apnea--- refuses CPAP 01/18/2015   Essential hypertension 01/18/2015   Leg edema 01/18/2015   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   CAD (coronary artery disease), native coronary artery/H/o CABG x 4 12/26/2014    PCP: Sheryle Carwin, MD  REFERRING PROVIDER: Sheryle Carwin, MD  REFERRING DIAG:  Free Text Diagnosis  right leg cellulitis  THERAPY DIAG:  Edema most likely lymphedema causing cellulitis in both LE   Rationale for Evaluation and Treatment: Rehabilitation  ONSET DATE: chronic at least  since 2016  SUBJECTIVE:                                                                                                                                                                                           SUBJECTIVE STATEMENT:  Pt states that he has not seen his legs this small since he was 15. PERTINENT HISTORY: leg edema, CAD,  PAIN:  Are you having  pain? Yes NPRS scale: 0-3/10  Pain location: knees  Pain orientation: Left  PAIN TYPE: itching and tightness  Pain description: intermittent  Aggravating factors: activity Relieving factors: mornings   PRECAUTIONS: Other: cellulitis   RED FLAGS: None   WEIGHT BEARING RESTRICTIONS: No  FALLS:  Has patient fallen in last 6 months? No  LIVING ENVIRONMENT: Lives with: lives with their spouse Lives in: House/apartment OCCUPATION: retired   LEISURE: walking   PRIOR LEVEL OF FUNCTION: Independent  PATIENT GOALS: less swelling no infection    OBJECTIVE: Note: Objective measures were completed at Evaluation unless otherwise noted.  COGNITION: Overall cognitive status: Within functional limits for tasks assessed   PALPATION: Noted induration   OBSERVATIONS / OTHER ASSESSMENTS: noted erythema ,positive stemmer sign   LYMPHEDEMA ASSESSMENTS:    LE LANDMARK RIGHT eval    At groin 06/16/24 06/25/24 07/02/24  30 cm proximal to suprapatella     20 cm proximal to suprapatella 67 65.3 65.3   10 cm proximal to suprapatella 57.8 57 55  At midpatella / popliteal crease 49.3 50.3 49  30 cm proximal to floor at lateral plantar foot 55 54.5 48  20 cm proximal to floor at lateral plantar foot 47.3 47.3 37.5  10 cm proximal to floor at lateral plantar foot 36.8 35.8 30.8  Circumference of ankle/heel 40.9 40.3 39.3  5 cm proximal to 1st MTP joint 29.7 30 30.5  Across MTP joint 28.9 28.8 29  Around proximal great toe 11 11 10.5  (Blank rows = not tested)  LE LANDMARK LEFT eval 06/25/24 07/02/24  At groin 06/16/24    30 cm proximal to suprapatella     20 cm proximal to suprapatella 64.8 64.8 62.8  10 cm proximal to suprapatella 58.7 58.7 56.5  At midpatella / popliteal crease 52.4 48.7 49.8  30 cm proximal to floor at lateral plantar foot 53.6 53.5 46.5  20 cm proximal to floor at lateral plantar foot 46.7 47 38  10 cm proximal to floor at lateral plantar foot 38.4 36.7 32  Circumference of ankle/heel 40.5 36.4 38.2  5 cm proximal to 1st MTP joint 29.4 31.5 31  Across MTP joint 28.6 29.5 29.5  Around proximal great toe 11.6 11.8 11.5  (Blank rows = not tested)  FUNCTIONAL TESTS:  30 seconds chair stand test:  9 average for age is 11-15 2 minute walk test: 348 ft   TODAY'S TREATMENT:                                                                                                                              DATE:  07/04/24: Manual decongestive techniques to include: supraclavicular, short and deep abdominal, inguinal/axillary anastomosis and B LE.  Posterior completed in side lying position.   Application of  multilayer short stretch bandages with 1/2in foam B.    Education details: 07/02/24:  compression bandaging using 1/2 foam and multiple short stretch bandages.    06/18/24:  self manual techniques  06/16/24 : HEP, what lymphedema is and how it is controlled,(four aspects to treatment) Person educated: Patient Education method: Explanation, Demonstration, Verbal cues, and Handouts Education comprehension: verbalized understanding  HOME EXERCISE PROGRAM: Access Code: [email or web address] URL: [email or web address] Date: 06/16/2024 Prepared by: Montie Metro  Exercises - Seated Diaphragmatic Breathing  - 1 x daily - 7 x weekly - 10 reps - 5 hold - Seated Cervical Sidebending AROM  - 1 x daily - 7 x weekly - 1 sets - 10 reps - 2-3 hold - Seated Cervical Rotation AROM  - 1 x daily - 7 x weekly - 1 sets - 10 reps - 2-3 hold - Seated Cervical Extension AROM  - 1 x daily - 7 x weekly - 1 sets - 10 reps - 2-3 hold - Seated Cervical Retraction  - 1 x daily - 7 x weekly - 1 sets - 10 reps - 2-3 hold - Shoulder Rolls in Sitting  - 1 x daily - 7 x weekly - 1 sets - 10 reps - 2-3 hold - Seated Sidebending Arms Overhead  - 1 x daily - 7 x weekly - 1 sets - 10 reps - 2-3 hold - Seated Hip Abduction  - 1 x daily - 7 x weekly - 1 sets - 10 reps - 2-3  hold - Seated Long Arc Quad  - 1 x daily - 7 x weekly - 1 sets - 10 reps - 2-3 hold - Seated Heel Toe Raises  - 1 x daily - 7 x weekly - 1 sets - 10 reps - 2-3 hold - Seated Toe Curl  - 1 x daily - 7 x weekly - 1 sets - 10 reps - 2-3 hold  ASSESSMENT:  CLINICAL IMPRESSION:  Pt continues to decongest.  PT noting improved ability to ambulate as well as to lift his legs up into the bed at home.  Pt will continue to benefit from total decongestive techniques.    OBJECTIVE IMPAIRMENTS: difficulty walking, increased edema, pain, and decreased skin integrity .  ACTIVITY LIMITATIONS: dressing, hygiene/grooming, and locomotion level  REHAB POTENTIAL: Good  CLINICAL DECISION MAKING: Evolving/moderate complexity  EVALUATION COMPLEXITY: Moderate  GOALS: Goals reviewed with patient? No  SHORT TERM GOALS: Target date: 07/07/24  Pt to be I in HEP to improve lymphatic circulation  Baseline: Goal status: met   2.  Pt to be completing daily skin care to assist in preventing cellulitis  Baseline:  Goal status: met  3.  Pt to have lost 3 cm from Lt LE measurements Baseline:  Goal status: partially met    LONG TERM GOALS: Target date: 07/28/24  PT to be I in self manual techniques  Baseline:  Goal status: met  2.  Pt to understand that he should be wearing compression wear daily Baseline:  Goal status: on-going  3.  PT to have lost 4-5 cm from Lt LE daily to assist in preventing cellulitis  Baseline:  Goal status: on-going 4.  PT to have and be using a compression pump to assist in decreasing fluid in his LE                     Goal Status:  on going   PLAN:  PT FREQUENCY: 3x/week  PT DURATION: 6 weeks  PLANNED INTERVENTIONS: 97110-Therapeutic exercises, 97535- Self Care, and 02859- Manual therapy  PLAN FOR NEXT SESSION: continue total decongestive techniques.  Montie Metro, PT CLT [phone]  07/04/2024, 11:12 AM [place]

## 2024-07-07 ENCOUNTER — Ambulatory Visit (HOSPITAL_COMMUNITY)

## 2024-07-07 ENCOUNTER — Encounter (HOSPITAL_COMMUNITY): Payer: Self-pay

## 2024-07-07 DIAGNOSIS — R600 Localized edema: Secondary | ICD-10-CM | POA: Diagnosis not present

## 2024-07-07 DIAGNOSIS — M79672 Pain in left foot: Secondary | ICD-10-CM | POA: Diagnosis not present

## 2024-07-07 DIAGNOSIS — M6281 Muscle weakness (generalized): Secondary | ICD-10-CM

## 2024-07-07 DIAGNOSIS — I890 Lymphedema, not elsewhere classified: Secondary | ICD-10-CM

## 2024-07-07 DIAGNOSIS — R262 Difficulty in walking, not elsewhere classified: Secondary | ICD-10-CM

## 2024-07-07 NOTE — Therapy (Signed)
 OUTPATIENT PHYSICAL THERAPY LYMPHEDEMA Treatment  Patient Name: Devin [name] MRN: [id number] DOB:[birth year], 78 y.o., male Today's Date: 07/07/2024  END OF SESSION:  PT End of Session - 07/07/24 1117     Visit Number 9    Number of Visits 18    Date for PT Re-Evaluation 07/28/24    Authorization Type medicare    Progress Note Due on Visit 10    PT Start Time 1003    PT Stop Time 1108    PT Time Calculation (min) 65 min    Activity Tolerance Patient tolerated treatment well    Behavior During Therapy [facility] for tasks assessed/performed              Past Medical History:  Diagnosis Date   Arthritis    Asbestosis (HCC)    CAD (coronary artery disease)    Multivessel status post CABG March 2016 - LIMA to LAD, SVG to OM1, SVG to OM2, and SVG to PDA.   Essential hypertension    GERD (gastroesophageal reflux disease)    Melanoma of back (HCC)    Prostatic hypertrophy    Sleep apnea    Intolerant of CPAP   Past Surgical History:  Procedure Laterality Date   ANTERIOR CERVICAL DECOMP/DISCECTOMY FUSION     CARDIOVERSION N/A 01/28/2015   Procedure: CARDIOVERSION;  Surgeon: Dorn JULIANNA Ross, MD;  Location: [facility];  Service: Endoscopy;  Laterality: N/A;   CHOLECYSTECTOMY N/A 04/07/2020   Procedure: LAPAROSCOPIC CHOLECYSTECTOMY;  Surgeon: Kallie Manuelita BROCKS, MD;  Location: [facility];  Service: General;  Laterality: N/A;   CORONARY ARTERY BYPASS GRAFT N/A 12/29/2014   Procedure: CORONARY ARTERY BYPASS GRAFTING (CABG), ON PUMP, TIMES FOUR, USING LEFT INTERNAL MAMMARY ARTERY, RIGHT GREATER SAPHENOUS VEIN HARVESTED ENDOSCOPICALLY;  Surgeon: Maude Fleeta Ochoa, MD;  Location: [facility] OR;  Service: Open Heart Surgery;  Laterality: N/A;   KNEE ARTHROSCOPY Right    KNEE CARTILAGE SURGERY Left    LEFT HEART CATHETERIZATION WITH CORONARY ANGIOGRAM N/A 12/28/2014   Procedure: LEFT HEART CATHETERIZATION WITH CORONARY ANGIOGRAM;  Surgeon: Dorn JINNY Lesches, MD;  Location: [facility] CATH LAB;  Service:  Cardiovascular;  Laterality: N/A;   MELANOMA EXCISION     back   SHOULDER OPEN ROTATOR CUFF REPAIR Left    TEE WITHOUT CARDIOVERSION N/A 12/29/2014   Procedure: TRANSESOPHAGEAL ECHOCARDIOGRAM (TEE);  Surgeon: Maude Fleeta Ochoa, MD;  Location: [facility] OR;  Service: Open Heart Surgery;  Laterality: N/A;   TEE WITHOUT CARDIOVERSION N/A 01/28/2015   Procedure: TRANSESOPHAGEAL ECHOCARDIOGRAM (TEE);  Surgeon: Dorn JULIANNA Ross, MD;  Location: [facility];  Service: Endoscopy;  Laterality: N/A;   Patient Active Problem List   Diagnosis Date Noted   Cough variant asthma vs UACS  02/17/2022   DOE (dyspnea on exertion) 12/07/2020   Calculus of gallbladder with acute cholecystitis without obstruction 04/06/2020   Hypogonadism in male 10/17/2019   Nocturia 10/17/2019   Erectile dysfunction due to arterial insufficiency 10/17/2019   Atrial fibrillation and flutter (HCC) 01/23/2015   Palpitations 01/23/2015   Hyperlipidemia 01/23/2015   BPH with urinary obstruction 01/23/2015   Chronic diastolic CHF (congestive heart failure) /HFpEF 01/23/2015   Atrial fibrillation with RVR (HCC) 01/23/2015   Obstructive sleep apnea--- refuses CPAP 01/18/2015   Essential hypertension 01/18/2015   Leg edema 01/18/2015   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   CAD (coronary artery disease), native coronary artery/H/o CABG x 4 12/26/2014    PCP: Sheryle Carwin, MD  REFERRING PROVIDER: Sheryle Carwin, MD  REFERRING DIAG:  Free Text Diagnosis  right leg cellulitis    THERAPY DIAG:  Edema most likely lymphedema causing cellulitis in both LE   Rationale for Evaluation and Treatment: Rehabilitation  ONSET DATE: chronic at least  since 2016  SUBJECTIVE:                                                                                                                                                                                           SUBJECTIVE STATEMENT:   Pt stated he is impressed with legs being so small.  He has lost 21# since  beginning therapy.  Lt ankle pain comes and goes.  Has not received call concerning pump.  PERTINENT HISTORY: leg edema, CAD,  PAIN:  Are you having pain? Yes NPRS scale: 0-3/10  Pain location: knees  Pain orientation: Left  PAIN TYPE: itching and tightness  Pain description: intermittent  Aggravating factors: activity Relieving factors: mornings   PRECAUTIONS: Other: cellulitis   RED FLAGS: None   WEIGHT BEARING RESTRICTIONS: No  FALLS:  Has patient fallen in last 6 months? No  LIVING ENVIRONMENT: Lives with: lives with their spouse Lives in: House/apartment OCCUPATION: retired   LEISURE: walking   PRIOR LEVEL OF FUNCTION: Independent  PATIENT GOALS: less swelling no infection    OBJECTIVE: Note: Objective measures were completed at Evaluation unless otherwise noted.  COGNITION: Overall cognitive status: Within functional limits for tasks assessed   PALPATION: Noted induration   OBSERVATIONS / OTHER ASSESSMENTS: noted erythema ,positive stemmer sign   LYMPHEDEMA ASSESSMENTS:    LE LANDMARK RIGHT eval    At groin 06/16/24 06/25/24 07/02/24  30 cm proximal to suprapatella     20 cm proximal to suprapatella 67 65.3 65.3   10 cm proximal to suprapatella 57.8 57 55  At midpatella / popliteal crease 49.3 50.3 49  30 cm proximal to floor at lateral plantar foot 55 54.5 48  20 cm proximal to floor at lateral plantar foot 47.3 47.3 37.5  10 cm proximal to floor at lateral plantar foot 36.8 35.8 30.8  Circumference of ankle/heel 40.9 40.3 39.3  5 cm proximal to 1st MTP joint 29.7 30 30.5  Across MTP joint 28.9 28.8 29  Around proximal great toe 11 11 10.5  (Blank rows = not tested)  LE LANDMARK LEFT eval 06/25/24 07/02/24  At groin 06/16/24    30 cm proximal to suprapatella     20 cm proximal to suprapatella 64.8 64.8 62.8  10 cm proximal to suprapatella 58.7 58.7 56.5  At midpatella / popliteal crease 52.4 48.7 49.8  30 cm proximal to floor at lateral plantar  foot  53.6 53.5 46.5  20 cm proximal to floor at lateral plantar foot 46.7 47 38  10 cm proximal to floor at lateral plantar foot 38.4 36.7 32  Circumference of ankle/heel 40.5 36.4 38.2  5 cm proximal to 1st MTP joint 29.4 31.5 31  Across MTP joint 28.6 29.5 29.5  Around proximal great toe 11.6 11.8 11.5  (Blank rows = not tested)  FUNCTIONAL TESTS:  30 seconds chair stand test:  9 average for age is 11-15 2 minute walk test: 348 ft   TODAY'S TREATMENT:                                                                                                                              DATE:  07/07/24: Manual decongestive techniques to include: supraclavicular, short and deep abdominal, inguinal/axillary anastomosis and B LE.  Posterior completed in side lying position.   Application of  multilayer short stretch bandages with 1/2in foam B foot to knee.  07/04/24: Manual decongestive techniques to include: supraclavicular, short and deep abdominal, inguinal/axillary anastomosis and B LE.  Posterior completed in side lying position.   Application of  multilayer short stretch bandages with 1/2in foam B.    Education details: 07/02/24:  compression bandaging using 1/2 foam and multiple short stretch bandages.    06/18/24:  self manual techniques  06/16/24 : HEP, what lymphedema is and how it is controlled,(four aspects to treatment) Person educated: Patient Education method: Explanation, Demonstration, Verbal cues, and Handouts Education comprehension: verbalized understanding  HOME EXERCISE PROGRAM: Access Code: [email or web address] URL: [email or web address] Date: 06/16/2024 Prepared by: Montie Metro  Exercises - Seated Diaphragmatic Breathing  - 1 x daily - 7 x weekly - 10 reps - 5 hold - Seated Cervical Sidebending AROM  - 1 x daily - 7 x weekly - 1 sets - 10 reps - 2-3 hold - Seated Cervical Rotation AROM  - 1 x daily - 7 x weekly - 1 sets - 10 reps - 2-3 hold - Seated Cervical  Extension AROM  - 1 x daily - 7 x weekly - 1 sets - 10 reps - 2-3 hold - Seated Cervical Retraction  - 1 x daily - 7 x weekly - 1 sets - 10 reps - 2-3 hold - Shoulder Rolls in Sitting  - 1 x daily - 7 x weekly - 1 sets - 10 reps - 2-3 hold - Seated Sidebending Arms Overhead  - 1 x daily - 7 x weekly - 1 sets - 10 reps - 2-3 hold - Seated Hip Abduction  - 1 x daily - 7 x weekly - 1 sets - 10 reps - 2-3 hold - Seated Long Arc Quad  - 1 x daily - 7 x weekly - 1 sets - 10 reps - 2-3 hold - Seated Heel Toe Raises  - 1 x daily - 7 x weekly - 1 sets - 10 reps - 2-3 hold - Seated Toe Curl  -  1 x daily - 7 x weekly - 1 sets - 10 reps - 2-3 hold  ASSESSMENT:  CLINICAL IMPRESSION:   Pt continues to decongest.  Manual lymph decongestive techniques with main focus on distal extremities, induration has reduced though still present.  Applied benedryl and lotion to address c/o itchy/dryskin prior applicatoin of multilayer short stretch bandages with 1/2in foam to knee.  Reports of comfort at EOS.  OBJECTIVE IMPAIRMENTS: difficulty walking, increased edema, pain, and decreased skin integrity .   ACTIVITY LIMITATIONS: dressing, hygiene/grooming, and locomotion level  REHAB POTENTIAL: Good  CLINICAL DECISION MAKING: Evolving/moderate complexity  EVALUATION COMPLEXITY: Moderate  GOALS: Goals reviewed with patient? No  SHORT TERM GOALS: Target date: 07/07/24  Pt to be I in HEP to improve lymphatic circulation  Baseline: Goal status: met   2.  Pt to be completing daily skin care to assist in preventing cellulitis  Baseline:  Goal status: met  3.  Pt to have lost 3 cm from Lt LE measurements Baseline:  Goal status: partially met    LONG TERM GOALS: Target date: 07/28/24  PT to be I in self manual techniques  Baseline:  Goal status: met  2.  Pt to understand that he should be wearing compression wear daily Baseline:  Goal status: on-going  3.  PT to have lost 4-5 cm from Lt LE daily  to assist in preventing cellulitis  Baseline:  Goal status: on-going 4.  PT to have and be using a compression pump to assist in decreasing fluid in his LE                     Goal Status:  on going   PLAN:  PT FREQUENCY: 3x/week  PT DURATION: 6 weeks  PLANNED INTERVENTIONS: 97110-Therapeutic exercises, 97535- Self Care, and 02859- Manual therapy  PLAN FOR NEXT SESSION: continue total decongestive techniques.    Augustin Mclean, LPTA/CLT; [facility] [phone]  07/07/2024, 11:23 AM

## 2024-07-09 ENCOUNTER — Ambulatory Visit (HOSPITAL_COMMUNITY): Admitting: Physical Therapy

## 2024-07-09 DIAGNOSIS — R600 Localized edema: Secondary | ICD-10-CM | POA: Diagnosis not present

## 2024-07-09 DIAGNOSIS — I890 Lymphedema, not elsewhere classified: Secondary | ICD-10-CM

## 2024-07-09 DIAGNOSIS — M79672 Pain in left foot: Secondary | ICD-10-CM

## 2024-07-09 DIAGNOSIS — M6281 Muscle weakness (generalized): Secondary | ICD-10-CM | POA: Diagnosis not present

## 2024-07-09 DIAGNOSIS — R262 Difficulty in walking, not elsewhere classified: Secondary | ICD-10-CM | POA: Diagnosis not present

## 2024-07-09 NOTE — Therapy (Signed)
 OUTPATIENT PHYSICAL THERAPY LYMPHEDEMA Treatment/Progress  Patient Name: Devin [name] MRN: [id number] DOB:[birth year], 78 y.o., male Today's Date: 07/09/2024 Progress Note Reporting Period 06/16/24 to 07/09/24  See note below for Objective Data and Assessment of Progress/Goals.     END OF SESSION:  PT End of Session - 07/09/24 1428     Visit Number 10    Number of Visits 18    Date for PT Re-Evaluation 07/28/24    Authorization Type medicare    Progress Note Due on Visit 10    PT Start Time 1015    PT Stop Time 1114    PT Time Calculation (min) 59 min    Activity Tolerance Patient tolerated treatment well    Behavior During Therapy [facility] for tasks assessed/performed               Past Medical History:  Diagnosis Date   Arthritis    Asbestosis (HCC)    CAD (coronary artery disease)    Multivessel status post CABG March 2016 - LIMA to LAD, SVG to OM1, SVG to OM2, and SVG to PDA.   Essential hypertension    GERD (gastroesophageal reflux disease)    Melanoma of back (HCC)    Prostatic hypertrophy    Sleep apnea    Intolerant of CPAP   Past Surgical History:  Procedure Laterality Date   ANTERIOR CERVICAL DECOMP/DISCECTOMY FUSION     CARDIOVERSION N/A 01/28/2015   Procedure: CARDIOVERSION;  Surgeon: Dorn JULIANNA Ross, MD;  Location: [facility] ORS;  Service: Endoscopy;  Laterality: N/A;   CHOLECYSTECTOMY N/A 04/07/2020   Procedure: LAPAROSCOPIC CHOLECYSTECTOMY;  Surgeon: Kallie Manuelita BROCKS, MD;  Location: [facility] ORS;  Service: General;  Laterality: N/A;   CORONARY ARTERY BYPASS GRAFT N/A 12/29/2014   Procedure: CORONARY ARTERY BYPASS GRAFTING (CABG), ON PUMP, TIMES FOUR, USING LEFT INTERNAL MAMMARY ARTERY, RIGHT GREATER SAPHENOUS VEIN HARVESTED ENDOSCOPICALLY;  Surgeon: Maude Fleeta Ochoa, MD;  Location[facility] OR;  Service: Open Heart Surgery;  Laterality: N/A;   KNEE ARTHROSCOPY Right    KNEE CARTILAGE SURGERY Left    LEFT HEART CATHETERIZATION WITH CORONARY ANGIOGRAM N/A 12/28/2014    Procedure: LEFT HEART CATHETERIZATION WITH CORONARY ANGIOGRAM;  Surgeon: Dorn JINNY Lesches, MD;  Location: [facility] CATH LAB;  Service: Cardiovascular;  Laterality: N/A;   MELANOMA EXCISION     back   SHOULDER OPEN ROTATOR CUFF REPAIR Left    TEE WITHOUT CARDIOVERSION N/A 12/29/2014   Procedure: TRANSESOPHAGEAL ECHOCARDIOGRAM (TEE);  Surgeon: Maude Fleeta Ochoa, MD;  Location: [facility] OR;  Service: Open Heart Surgery;  Laterality: N/A;   TEE WITHOUT CARDIOVERSION N/A 01/28/2015   Procedure: TRANSESOPHAGEAL ECHOCARDIOGRAM (TEE);  Surgeon: Dorn JULIANNA Ross, MD;  Location: [facility] ORS;  Service: Endoscopy;  Laterality: N/A;   Patient Active Problem List   Diagnosis Date Noted   Cough variant asthma vs UACS  02/17/2022   DOE (dyspnea on exertion) 12/07/2020   Calculus of gallbladder with acute cholecystitis without obstruction 04/06/2020   Hypogonadism in male 10/17/2019   Nocturia 10/17/2019   Erectile dysfunction due to arterial insufficiency 10/17/2019   Atrial fibrillation and flutter (HCC) 01/23/2015   Palpitations 01/23/2015   Hyperlipidemia 01/23/2015   BPH with urinary obstruction 01/23/2015   Chronic diastolic CHF (congestive heart failure) /HFpEF 01/23/2015   Atrial fibrillation with RVR (HCC) 01/23/2015   Obstructive sleep apnea--- refuses CPAP 01/18/2015   Essential hypertension 01/18/2015   Leg edema 01/18/2015   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   CAD (coronary artery disease), native coronary  artery/H/o CABG x 4 12/26/2014    PCP: Sheryle Carwin, MD  REFERRING PROVIDER: Sheryle Carwin, MD  REFERRING DIAG:  Free Text Diagnosis  right leg cellulitis    THERAPY DIAG:  Edema most likely lymphedema causing cellulitis in both LE   Rationale for Evaluation and Treatment: Rehabilitation  ONSET DATE: chronic at least  since 2016  SUBJECTIVE:                                                                                                                                                                                            SUBJECTIVE STATEMENT:  PT states that they called about the pump but he has not called them back.   No call about the juxtafit at this time.  PERTINENT HISTORY: leg edema, CAD,  PAIN:  Are you having pain? Yes NPRS scale: 0-3/10  Pain location: knees  Pain orientation: Left  PAIN TYPE: itching and tightness  Pain description: intermittent  Aggravating factors: activity Relieving factors: mornings   PRECAUTIONS: Other: cellulitis   RED FLAGS: None   WEIGHT BEARING RESTRICTIONS: No  FALLS:  Has patient fallen in last 6 months? No  LIVING ENVIRONMENT: Lives with: lives with their spouse Lives in: House/apartment OCCUPATION: retired   LEISURE: walking   PRIOR LEVEL OF FUNCTION: Independent  PATIENT GOALS: less swelling no infection    OBJECTIVE: Note: Objective measures were completed at Evaluation unless otherwise noted.  COGNITION: Overall cognitive status: Within functional limits for tasks assessed   PALPATION: Noted induration   OBSERVATIONS / OTHER ASSESSMENTS: noted erythema ,positive stemmer sign   LYMPHEDEMA ASSESSMENTS:    LE LANDMARK RIGHT eval     At groin 06/16/24 06/25/24 07/02/24 07/09/24  30 cm proximal to suprapatella      20 cm proximal to suprapatella 67 65.3 65.3  65.7   10 cm proximal to suprapatella 57.8 57 55 55.7  At midpatella / popliteal crease 49.3 50.3 49 49  30 cm proximal to floor at lateral plantar foot 55 54.5 48 47.5  20 cm proximal to floor at lateral plantar foot 47.3 47.3 37.5 37  10 cm proximal to floor at lateral plantar foot 36.8 35.8 30.8 33  Circumference of ankle/heel 40.9 40.3 39.3 39.3  5 cm proximal to 1st MTP joint 29.7 30 30.5 28.4  Across MTP joint 28.9 28.8 29 28   Around proximal great toe 11 11 10.5 10.1  (Blank rows = not tested)  LE LANDMARK LEFT eval 06/25/24 07/02/24 07/09/24  At groin 06/16/24   07/09/24  30 cm proximal to suprapatella      20 cm proximal to suprapatella 64.8  64.8 62.8  63.5  10 cm proximal to suprapatella 58.7 58.7 56.5 56.7  At midpatella / popliteal crease 52.4 48.7 49.8 49  30 cm proximal to floor at lateral plantar foot 53.6 53.5 46.5 46.8  20 cm proximal to floor at lateral plantar foot 46.7 47 38 35  10 cm proximal to floor at lateral plantar foot 38.4 36.7 32 32.5  Circumference of ankle/heel 40.5 36.4 38.2 38.8  5 cm proximal to 1st MTP joint 29.4 31.5 31 29.4  Across MTP joint 28.6 29.5 29.5 28.6  Around proximal great toe 11.6 11.8 11.5 11.5  (Blank rows = not tested)  FUNCTIONAL TESTS:  30 seconds chair stand test:  9 average for age is 11-15 2 minute walk test: 348 ft   TODAY'S TREATMENT:                                                                                                                              DATE:  07/09/24: measurement please see table above Manual decongestive techniques to include: supraclavicular, short and deep abdominal, inguinal/axillary anastomosis and B LE.  Posterior completed in side lying position.   Application of  multilayer short stretch bandages with 1/2in foam B foot to knee.   Education details: 07/02/24:  compression bandaging using 1/2 foam and multiple short stretch bandages.    06/18/24:  self manual techniques  06/16/24 : HEP, what lymphedema is and how it is controlled,(four aspects to treatment) Person educated: Patient Education method: Explanation, Demonstration, Verbal cues, and Handouts Education comprehension: verbalized understanding  HOME EXERCISE PROGRAM: Access Code: [email or web address] URL: [email or web address] Date: 06/16/2024 Prepared by: Montie Metro  Exercises - Seated Diaphragmatic Breathing  - 1 x daily - 7 x weekly - 10 reps - 5 hold - Seated Cervical Sidebending AROM  - 1 x daily - 7 x weekly - 1 sets - 10 reps - 2-3 hold - Seated Cervical Rotation AROM  - 1 x daily - 7 x weekly - 1 sets - 10 reps - 2-3 hold - Seated Cervical Extension AROM  - 1 x daily - 7 x  weekly - 1 sets - 10 reps - 2-3 hold - Seated Cervical Retraction  - 1 x daily - 7 x weekly - 1 sets - 10 reps - 2-3 hold - Shoulder Rolls in Sitting  - 1 x daily - 7 x weekly - 1 sets - 10 reps - 2-3 hold - Seated Sidebending Arms Overhead  - 1 x daily - 7 x weekly - 1 sets - 10 reps - 2-3 hold - Seated Hip Abduction  - 1 x daily - 7 x weekly - 1 sets - 10 reps - 2-3 hold - Seated Long Arc Quad  - 1 x daily - 7 x weekly - 1 sets - 10 reps - 2-3 hold - Seated Heel Toe Raises  - 1 x daily - 7 x weekly - 1 sets - 10 reps -  2-3 hold - Seated Toe Curl  - 1 x daily - 7 x weekly - 1 sets - 10 reps - 2-3 hold  ASSESSMENT:  CLINICAL IMPRESSION:   Pt continues to decongest.  Therapist called [facility] who is waiting on paperwork from MD>  Pt covered at 100%.   Applied benedryl and lotion to address c/o itchy/dryskin prior applicatoin of multilayer short stretch bandages with 1/2in foam to knee.  Reports of comfort at EOS. Continue to see pt until max decongestion.  OBJECTIVE IMPAIRMENTS: difficulty walking, increased edema, pain, and decreased skin integrity .   ACTIVITY LIMITATIONS: dressing, hygiene/grooming, and locomotion level  REHAB POTENTIAL: Good  CLINICAL DECISION MAKING: Evolving/moderate complexity  EVALUATION COMPLEXITY: Moderate  GOALS: Goals reviewed with patient? No  SHORT TERM GOALS: Target date: 07/07/24  Pt to be I in HEP to improve lymphatic circulation  Baseline: Goal status: met   2.  Pt to be completing daily skin care to assist in preventing cellulitis  Baseline:  Goal status: met  3.  Pt to have lost 3 cm from Lt LE measurements Baseline:  Goal status: met   LONG TERM GOALS: Target date: 07/28/24  PT to be I in self manual techniques  Baseline:  Goal status: met  2.  Pt to understand that he should be wearing compression wear daily Baseline:  Goal status: on-going  3.  PT to have lost 4-5 cm from Lt LE daily to assist in preventing cellulitis   Baseline:  Goal status: on-going 4.  PT to have and be using a compression pump to assist in decreasing fluid in his LE                     Goal Status:  on going   PLAN:  PT FREQUENCY: 3x/week  PT DURATION: 6 weeks  PLANNED INTERVENTIONS: 97110-Therapeutic exercises, 97535- Self Care, and 02859- Manual therapy  PLAN FOR NEXT SESSION: continue total decongestive techniques.   Montie Metro, PT CLT [phone]  07/09/2024, 2:28 PM

## 2024-07-11 ENCOUNTER — Ambulatory Visit (HOSPITAL_COMMUNITY)

## 2024-07-11 ENCOUNTER — Encounter (HOSPITAL_COMMUNITY): Admitting: Physical Therapy

## 2024-07-11 ENCOUNTER — Encounter (HOSPITAL_COMMUNITY): Payer: Self-pay

## 2024-07-11 DIAGNOSIS — M79672 Pain in left foot: Secondary | ICD-10-CM

## 2024-07-11 DIAGNOSIS — I890 Lymphedema, not elsewhere classified: Secondary | ICD-10-CM

## 2024-07-11 DIAGNOSIS — R600 Localized edema: Secondary | ICD-10-CM | POA: Diagnosis not present

## 2024-07-11 DIAGNOSIS — R262 Difficulty in walking, not elsewhere classified: Secondary | ICD-10-CM

## 2024-07-11 DIAGNOSIS — M6281 Muscle weakness (generalized): Secondary | ICD-10-CM | POA: Diagnosis not present

## 2024-07-11 NOTE — Therapy (Signed)
 OUTPATIENT PHYSICAL THERAPY LYMPHEDEMA Treatment  Patient Name: Devin [name] MRN: [id number] DOB:28-Sep-1946, 78 y.o., male Today's Date: 07/11/2024   END OF SESSION:  PT End of Session - 07/11/24 1001     Visit Number 11    Number of Visits 18    Date for PT Re-Evaluation 07/28/24    Authorization Type medicare    Progress Note Due on Visit 10    PT Start Time 0851    PT Stop Time 0953    PT Time Calculation (min) 62 min    Activity Tolerance Patient tolerated treatment well    Behavior During Therapy [facility] for tasks assessed/performed               Past Medical History:  Diagnosis Date   Arthritis    Asbestosis (HCC)    CAD (coronary artery disease)    Multivessel status post CABG March 2016 - LIMA to LAD, SVG to OM1, SVG to OM2, and SVG to PDA.   Essential hypertension    GERD (gastroesophageal reflux disease)    Melanoma of back (HCC)    Prostatic hypertrophy    Sleep apnea    Intolerant of CPAP   Past Surgical History:  Procedure Laterality Date   ANTERIOR CERVICAL DECOMP/DISCECTOMY FUSION     CARDIOVERSION N/A 01/28/2015   Procedure: CARDIOVERSION;  Surgeon: Dorn JULIANNA Ross, MD;  Location: [facility] ORS;  Service: Endoscopy;  Laterality: N/A;   CHOLECYSTECTOMY N/A 04/07/2020   Procedure: LAPAROSCOPIC CHOLECYSTECTOMY;  Surgeon: Kallie Manuelita BROCKS, MD;  Location: [facility] ORS;  Service: General;  Laterality: N/A;   CORONARY ARTERY BYPASS GRAFT N/A 12/29/2014   Procedure: CORONARY ARTERY BYPASS GRAFTING (CABG), ON PUMP, TIMES FOUR, USING LEFT INTERNAL MAMMARY ARTERY, RIGHT GREATER SAPHENOUS VEIN HARVESTED ENDOSCOPICALLY;  Surgeon: Maude Fleeta Ochoa, MD;  Location: [facility] OR;  Service: Open Heart Surgery;  Laterality: N/A;   KNEE ARTHROSCOPY Right    KNEE CARTILAGE SURGERY Left    LEFT HEART CATHETERIZATION WITH CORONARY ANGIOGRAM N/A 12/28/2014   Procedure: LEFT HEART CATHETERIZATION WITH CORONARY ANGIOGRAM;  Surgeon: Dorn JINNY Lesches, MD;  Location: [facility] CATH LAB;  Service:  Cardiovascular;  Laterality: N/A;   MELANOMA EXCISION     back   SHOULDER OPEN ROTATOR CUFF REPAIR Left    TEE WITHOUT CARDIOVERSION N/A 12/29/2014   Procedure: TRANSESOPHAGEAL ECHOCARDIOGRAM (TEE);  Surgeon: Maude Fleeta Ochoa, MD;  Location: [facility] OR;  Service: Open Heart Surgery;  Laterality: N/A;   TEE WITHOUT CARDIOVERSION N/A 01/28/2015   Procedure: TRANSESOPHAGEAL ECHOCARDIOGRAM (TEE);  Surgeon: Dorn JULIANNA Ross, MD;  Location: [facility]S;  Service: Endoscopy;  Laterality: N/A;   Patient Active Problem List   Diagnosis Date Noted   Cough variant asthma vs UACS  02/17/2022   DOE (dyspnea on exertion) 12/07/2020   Calculus of gallbladder with acute cholecystitis without obstruction 04/06/2020   Hypogonadism in male 10/17/2019   Nocturia 10/17/2019   Erectile dysfunction due to arterial insufficiency 10/17/2019   Atrial fibrillation and flutter (HCC) 01/23/2015   Palpitations 01/23/2015   Hyperlipidemia 01/23/2015   BPH with urinary obstruction 01/23/2015   Chronic diastolic CHF (congestive heart failure) /HFpEF 01/23/2015   Atrial fibrillation with RVR (HCC) 01/23/2015   Obstructive sleep apnea--- refuses CPAP 01/18/2015   Essential hypertension 01/18/2015   Leg edema 01/18/2015   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   CAD (coronary artery disease), native coronary artery/H/o CABG x 4 12/26/2014    PCP: Sheryle Carwin, MD  REFERRING PROVIDER: Sheryle Carwin, MD  REFERRING  DIAG:  Free Text Diagnosis  right leg cellulitis    THERAPY DIAG:  Edema most likely lymphedema causing cellulitis in both LE   Rationale for Evaluation and Treatment: Rehabilitation  ONSET DATE: chronic at least  since 2016  SUBJECTIVE:                                                                                                                                                                                           SUBJECTIVE STATEMENT:  Legs very itchy today, brought new pack of Benadryl lotion.  Lt ankle bothers him on  and off, needs new pair of shoes but going to wait til after therapy.  Reports he lost 15-18# since beginning therapy and improved mobility with increased ease donning shoes.  PERTINENT HISTORY: leg edema, CAD,  PAIN:  Are you having pain? Yes NPRS scale: 0-3/10  Pain location: knees  Pain orientation: Left  PAIN TYPE: itching and tightness  Pain description: intermittent  Aggravating factors: activity Relieving factors: mornings   PRECAUTIONS: Other: cellulitis   RED FLAGS: None   WEIGHT BEARING RESTRICTIONS: No  FALLS:  Has patient fallen in last 6 months? No  LIVING ENVIRONMENT: Lives with: lives with their spouse Lives in: House/apartment OCCUPATION: retired   LEISURE: walking   PRIOR LEVEL OF FUNCTION: Independent  PATIENT GOALS: less swelling no infection    OBJECTIVE: Note: Objective measures were completed at Evaluation unless otherwise noted.  COGNITION: Overall cognitive status: Within functional limits for tasks assessed   PALPATION: Noted induration   OBSERVATIONS / OTHER ASSESSMENTS: noted erythema ,positive stemmer sign   LYMPHEDEMA ASSESSMENTS:    LE LANDMARK RIGHT eval     At groin 06/16/24 06/25/24 07/02/24 07/09/24  30 cm proximal to suprapatella      20 cm proximal to suprapatella 67 65.3 65.3  65.7   10 cm proximal to suprapatella 57.8 57 55 55.7  At midpatella / popliteal crease 49.3 50.3 49 49  30 cm proximal to floor at lateral plantar foot 55 54.5 48 47.5  20 cm proximal to floor at lateral plantar foot 47.3 47.3 37.5 37  10 cm proximal to floor at lateral plantar foot 36.8 35.8 30.8 33  Circumference of ankle/heel 40.9 40.3 39.3 39.3  5 cm proximal to 1st MTP joint 29.7 30 30.5 28.4  Across MTP joint 28.9 28.8 29 28   Around proximal great toe 11 11 10.5 10.1  (Blank rows = not tested)  LE LANDMARK LEFT eval 06/25/24 07/02/24 07/09/24  At groin 06/16/24   07/09/24  30 cm proximal to suprapatella      20 cm proximal to suprapatella  64.8 64.8 62.8 63.5  10 cm proximal to suprapatella 58.7 58.7 56.5 56.7  At midpatella / popliteal crease 52.4 48.7 49.8 49  30 cm proximal to floor at lateral plantar foot 53.6 53.5 46.5 46.8  20 cm proximal to floor at lateral plantar foot 46.7 47 38 35  10 cm proximal to floor at lateral plantar foot 38.4 36.7 32 32.5  Circumference of ankle/heel 40.5 36.4 38.2 38.8  5 cm proximal to 1st MTP joint 29.4 31.5 31 29.4  Across MTP joint 28.6 29.5 29.5 28.6  Around proximal great toe 11.6 11.8 11.5 11.5  (Blank rows = not tested)  FUNCTIONAL TESTS:  30 seconds chair stand test:  9 average for age is 11-15 2 minute walk test: 348 ft   TODAY'S TREATMENT:                                                                                                                              DATE:  07/11/24: Manual decongestive techniques to include: supraclavicular, short and deep abdominal, inguinal/axillary anastomosis and B LE.  Posterior completed in side lying position.    Application of  lotion and Benadryl prior multilayer short stretch bandages with 1/2in foam B foot to knee. Called [place] with [facility] as pt stated difficulty communicating about pump, gave Penne new contact number to call back concerning possible additional insurance information.  07/09/24: measurement please see table above Manual decongestive techniques to include: supraclavicular, short and deep abdominal, inguinal/axillary anastomosis and B LE.  Posterior completed in side lying position.   Application of  multilayer short stretch bandages with 1/2in foam B foot to knee.   Education details: 07/02/24:  compression bandaging using 1/2 foam and multiple short stretch bandages.    06/18/24:  self manual techniques  06/16/24 : HEP, what lymphedema is and how it is controlled,(four aspects to treatment) Person educated: Patient Education method: Explanation, Demonstration, Verbal cues, and Handouts Education comprehension:  verbalized understanding  HOME EXERCISE PROGRAM: Access Code: [email or web address] URL: [email or web address] Date: 06/16/2024 Prepared by: Montie Metro  Exercises - Seated Diaphragmatic Breathing  - 1 x daily - 7 x weekly - 10 reps - 5 hold - Seated Cervical Sidebending AROM  - 1 x daily - 7 x weekly - 1 sets - 10 reps - 2-3 hold - Seated Cervical Rotation AROM  - 1 x daily - 7 x weekly - 1 sets - 10 reps - 2-3 hold - Seated Cervical Extension AROM  - 1 x daily - 7 x weekly - 1 sets - 10 reps - 2-3 hold - Seated Cervical Retraction  - 1 x daily - 7 x weekly - 1 sets - 10 reps - 2-3 hold - Shoulder Rolls in Sitting  - 1 x daily - 7 x weekly - 1 sets - 10 reps - 2-3 hold - Seated Sidebending Arms Overhead  - 1 x daily - 7 x weekly - 1 sets - 10  reps - 2-3 hold - Seated Hip Abduction  - 1 x daily - 7 x weekly - 1 sets - 10 reps - 2-3 hold - Seated Long Arc Quad  - 1 x daily - 7 x weekly - 1 sets - 10 reps - 2-3 hold - Seated Heel Toe Raises  - 1 x daily - 7 x weekly - 1 sets - 10 reps - 2-3 hold - Seated Toe Curl  - 1 x daily - 7 x weekly - 1 sets - 10 reps - 2-3 hold  ASSESSMENT:  CLINICAL IMPRESSION:   Manual lymphedema decongestive technqiues complete anterior and posterior for inguinal to axillary anastomosis.  Noted irritated skin on Lt shin, educated importance of avoiding scratching for skin integrity.  Applied heavy layer of lotion and Benedryl prior application of multilayer short stretch bandages with 1/2in foam to knee.  Called [place] with [facility] concerning pump, gave Penne new contact number to communicate additional information needed for pump.  Reports of comfort at EOS.  OBJECTIVE IMPAIRMENTS: difficulty walking, increased edema, pain, and decreased skin integrity .   ACTIVITY LIMITATIONS: dressing, hygiene/grooming, and locomotion level  REHAB POTENTIAL: Good  CLINICAL DECISION MAKING: Evolving/moderate complexity  EVALUATION COMPLEXITY:  Moderate  GOALS: Goals reviewed with patient? No  SHORT TERM GOALS: Target date: 07/07/24  Pt to be I in HEP to improve lymphatic circulation  Baseline: Goal status: met   2.  Pt to be completing daily skin care to assist in preventing cellulitis  Baseline:  Goal status: met  3.  Pt to have lost 3 cm from Lt LE measurements Baseline:  Goal status: met   LONG TERM GOALS: Target date: 07/28/24  PT to be I in self manual techniques  Baseline:  Goal status: met  2.  Pt to understand that he should be wearing compression wear daily Baseline:  Goal status: on-going  3.  PT to have lost 4-5 cm from Lt LE daily to assist in preventing cellulitis  Baseline:  Goal status: on-going 4.  PT to have and be using a compression pump to assist in decreasing fluid in his LE                     Goal Status:  on going   PLAN:  PT FREQUENCY: 3x/week  PT DURATION: 6 weeks  PLANNED INTERVENTIONS: 97110-Therapeutic exercises, 97535- Self Care, and 02859- Manual therapy  PLAN FOR NEXT SESSION: continue total decongestive techniques.   Augustin Mclean, LPTA/CLT; CBIS [phone]' 07/11/2024, 11:58 AM

## 2024-07-14 ENCOUNTER — Ambulatory Visit (HOSPITAL_COMMUNITY): Admitting: Physical Therapy

## 2024-07-14 DIAGNOSIS — M6281 Muscle weakness (generalized): Secondary | ICD-10-CM | POA: Diagnosis not present

## 2024-07-14 DIAGNOSIS — R262 Difficulty in walking, not elsewhere classified: Secondary | ICD-10-CM | POA: Diagnosis not present

## 2024-07-14 DIAGNOSIS — I890 Lymphedema, not elsewhere classified: Secondary | ICD-10-CM | POA: Diagnosis not present

## 2024-07-14 DIAGNOSIS — R600 Localized edema: Secondary | ICD-10-CM | POA: Diagnosis not present

## 2024-07-14 DIAGNOSIS — M79672 Pain in left foot: Secondary | ICD-10-CM | POA: Diagnosis not present

## 2024-07-14 NOTE — Therapy (Signed)
 OUTPATIENT PHYSICAL THERAPY LYMPHEDEMA Treatment  Patient Name: Devin [name] MRN: [id number] DOB:April 07, 1946, 78 y.o., male Today's Date: 07/14/2024   END OF SESSION:  PT End of Session - 07/14/24 1214     Visit Number 12    Number of Visits 18    Date for PT Re-Evaluation 07/28/24    Authorization Type medicare    Progress Note Due on Visit 10    PT Start Time 1017    PT Stop Time 1115    PT Time Calculation (min) 58 min    Activity Tolerance Patient tolerated treatment well    Behavior During Therapy [facility] for tasks assessed/performed                Past Medical History:  Diagnosis Date   Arthritis    Asbestosis (HCC)    CAD (coronary artery disease)    Multivessel status post CABG March 2016 - LIMA to LAD, SVG to OM1, SVG to OM2, and SVG to PDA.   Essential hypertension    GERD (gastroesophageal reflux disease)    Melanoma of back (HCC)    Prostatic hypertrophy    Sleep apnea    Intolerant of CPAP   Past Surgical History:  Procedure Laterality Date   ANTERIOR CERVICAL DECOMP/DISCECTOMY FUSION     CARDIOVERSION N/A 01/28/2015   Procedure: CARDIOVERSION;  Surgeon: Dorn JULIANNA Ross, MD;  Location: [facility] ORS;  Service: Endoscopy;  Laterality: N/A;   CHOLECYSTECTOMY N/A 04/07/2020   Procedure: LAPAROSCOPIC CHOLECYSTECTOMY;  Surgeon: Kallie Manuelita BROCKS, MD;  Location: [facility] ORS;  Service: General;  Laterality: N/A;   CORONARY ARTERY BYPASS GRAFT N/A 12/29/2014   Procedure: CORONARY ARTERY BYPASS GRAFTING (CABG), ON PUMP, TIMES FOUR, USING LEFT INTERNAL MAMMARY ARTERY, RIGHT GREATER SAPHENOUS VEIN HARVESTED ENDOSCOPICALLY;  Surgeon: Maude Fleeta Ochoa, MD;  Location: [facility] OR;  Service: Open Heart Surgery;  Laterality: N/A;   KNEE ARTHROSCOPY Right    KNEE CARTILAGE SURGERY Left    LEFT HEART CATHETERIZATION WITH CORONARY ANGIOGRAM N/A 12/28/2014   Procedure: LEFT HEART CATHETERIZATION WITH CORONARY ANGIOGRAM;  Surgeon: Dorn JINNY Lesches, MD;  Location: [facility] CATH LAB;  Service:  Cardiovascular;  Laterality: N/A;   MELANOMA EXCISION     back   SHOULDER OPEN ROTATOR CUFF REPAIR Left    TEE WITHOUT CARDIOVERSION N/A 12/29/2014   Procedure: TRANSESOPHAGEAL ECHOCARDIOGRAM (TEE);  Surgeon: Maude Fleeta Ochoa, MD;  Location: [facility] OR;  Service: Open Heart Surgery;  Laterality: N/A;   TEE WITHOUT CARDIOVERSION N/A 01/28/2015   Procedure: TRANSESOPHAGEAL ECHOCARDIOGRAM (TEE);  Surgeon: Dorn JULIANNA Ross, MD;  Location: [facility]S;  Service: Endoscopy;  Laterality: N/A;   Patient Active Problem List   Diagnosis Date Noted   Cough variant asthma vs UACS  02/17/2022   DOE (dyspnea on exertion) 12/07/2020   Calculus of gallbladder with acute cholecystitis without obstruction 04/06/2020   Hypogonadism in male 10/17/2019   Nocturia 10/17/2019   Erectile dysfunction due to arterial insufficiency 10/17/2019   Atrial fibrillation and flutter (HCC) 01/23/2015   Palpitations 01/23/2015   Hyperlipidemia 01/23/2015   BPH with urinary obstruction 01/23/2015   Chronic diastolic CHF (congestive heart failure) /HFpEF 01/23/2015   Atrial fibrillation with RVR (HCC) 01/23/2015   Obstructive sleep apnea--- refuses CPAP 01/18/2015   Essential hypertension 01/18/2015   Leg edema 01/18/2015   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   CAD (coronary artery disease), native coronary artery/H/o CABG x 4 12/26/2014    PCP: Sheryle Carwin, MD  REFERRING PROVIDER: Sheryle Carwin, MD  REFERRING DIAG:  Free Text Diagnosis  right leg cellulitis    THERAPY DIAG:  Edema most likely lymphedema causing cellulitis in both LE   Rationale for Evaluation and Treatment: Rehabilitation  ONSET DATE: chronic at least  since 2016  SUBJECTIVE:                                                                                                                                                                                           SUBJECTIVE STATEMENT:  Pt states that he received compression garments from [employer], he was unsure if he should  obtain open or closed toes but he got both.  He and his wife had a difficult time getting them on.  He wore the open toe and the garment pulled back about 1/2 of his foot and irritated him.  He had the bandaging off as he had a family reunion to go to and wanted to try the garments.  Notes that he did not have the garments on all the time and noticed that his legs definitely increased in size.  PERTINENT HISTORY: leg edema, CAD,  PAIN:  Are you having pain? Yes NPRS scale: 0-3/10  Pain location: knees  Pain orientation: Left  PAIN TYPE: itching and tightness  Pain description: intermittent  Aggravating factors: activity Relieving factors: mornings   PRECAUTIONS: Other: cellulitis   RED FLAGS: None   WEIGHT BEARING RESTRICTIONS: No  FALLS:  Has patient fallen in last 6 months? No  LIVING ENVIRONMENT: Lives with: lives with their spouse Lives in: House/apartment OCCUPATION: retired   LEISURE: walking   PRIOR LEVEL OF FUNCTION: Independent  PATIENT GOALS: less swelling no infection    OBJECTIVE: Note: Objective measures were completed at Evaluation unless otherwise noted.  COGNITION: Overall cognitive status: Within functional limits for tasks assessed   PALPATION: Noted induration   OBSERVATIONS / OTHER ASSESSMENTS: noted erythema ,positive stemmer sign   LYMPHEDEMA ASSESSMENTS:    LE LANDMARK RIGHT eval     At groin 06/16/24 06/25/24 07/02/24 07/09/24  30 cm proximal to suprapatella      20 cm proximal to suprapatella 67 65.3 65.3  65.7   10 cm proximal to suprapatella 57.8 57 55 55.7  At midpatella / popliteal crease 49.3 50.3 49 49  30 cm proximal to floor at lateral plantar foot 55 54.5 48 47.5  20 cm proximal to floor at lateral plantar foot 47.3 47.3 37.5 37  10 cm proximal to floor at lateral plantar foot 36.8 35.8 30.8 33  Circumference of ankle/heel 40.9 40.3 39.3 39.3  5 cm proximal to 1st MTP joint 29.7 30 30.5 28.4  Across MTP joint 28.9 28.8 29 28    Around proximal great toe 11 11 10.5 10.1  (Blank rows = not tested)  LE LANDMARK LEFT eval 06/25/24 07/02/24 07/09/24  At groin 06/16/24   07/09/24  30 cm proximal to suprapatella      20 cm proximal to suprapatella 64.8 64.8 62.8 63.5  10 cm proximal to suprapatella 58.7 58.7 56.5 56.7  At midpatella / popliteal crease 52.4 48.7 49.8 49  30 cm proximal to floor at lateral plantar foot 53.6 53.5 46.5 46.8  20 cm proximal to floor at lateral plantar foot 46.7 47 38 35  10 cm proximal to floor at lateral plantar foot 38.4 36.7 32 32.5  Circumference of ankle/heel 40.5 36.4 38.2 38.8  5 cm proximal to 1st MTP joint 29.4 31.5 31 29.4  Across MTP joint 28.6 29.5 29.5 28.6  Around proximal great toe 11.6 11.8 11.5 11.5  (Blank rows = not tested)  FUNCTIONAL TESTS:  30 seconds chair stand test:  9 average for age is 11-15 2 minute walk test: 348 ft   TODAY'S TREATMENT:                                                                                                                              DATE:  07/14/24: Manual decongestive techniques to include: supraclavicular, short and deep abdominal, inguinal/axillary anastomosis and B LE.  Posterior completed in side lying position.    Application of  lotion and Benadryl prior multilayer short stretch bandages with 1/2in foam B foot to knee.   Education details: 07/14/24:  Explained that if pt goes even an hour without compression, edema will begin to build up.  Therapist demonstrated donning compression garment with and without a butler.  Told pt where they could buy a butler from if they so desired as pt and wife appeared to be interested in the butler.  07/02/24:  compression bandaging using 1/2 foam and multiple short stretch bandages.    06/18/24:  self manual techniques  06/16/24 : HEP, what lymphedema is and how it is controlled,(four aspects to treatment) Person educated: Patient Education method: Explanation, Demonstration, Verbal cues, and  Handouts Education comprehension: verbalized understanding  HOME EXERCISE PROGRAM: Access Code: [email or web address] URL: [email or web address] Date: 06/16/2024 Prepared by: Montie Metro  Exercises - Seated Diaphragmatic Breathing  - 1 x daily - 7 x weekly - 10 reps - 5 hold - Seated Cervical Sidebending AROM  - 1 x daily - 7 x weekly - 1 sets - 10 reps - 2-3 hold - Seated Cervical Rotation AROM  - 1 x daily - 7 x weekly - 1 sets - 10 reps - 2-3 hold - Seated Cervical Extension AROM  - 1 x daily - 7 x weekly - 1 sets - 10 reps - 2-3 hold - Seated Cervical Retraction  - 1 x daily - 7 x weekly - 1 sets - 10 reps - 2-3 hold - Shoulder Rolls  in Sitting  - 1 x daily - 7 x weekly - 1 sets - 10 reps - 2-3 hold - Seated Sidebending Arms Overhead  - 1 x daily - 7 x weekly - 1 sets - 10 reps - 2-3 hold - Seated Hip Abduction  - 1 x daily - 7 x weekly - 1 sets - 10 reps - 2-3 hold - Seated Long Arc Quad  - 1 x daily - 7 x weekly - 1 sets - 10 reps - 2-3 hold - Seated Heel Toe Raises  - 1 x daily - 7 x weekly - 1 sets - 10 reps - 2-3 hold - Seated Toe Curl  - 1 x daily - 7 x weekly - 1 sets - 10 reps - 2-3 hold  ASSESSMENT:  CLINICAL IMPRESSION:   Manual lymphedema decongestive technqiues complete anterior and posterior for inguinal to axillary anastomosis.    Applied heavy layer of lotion and Benedryl prior application of multilayer short stretch bandages with 1/2in foam to knee. Education on donning compression garments and techniques to make it easier.   OBJECTIVE IMPAIRMENTS: difficulty walking, increased edema, pain, and decreased skin integrity .   ACTIVITY LIMITATIONS: dressing, hygiene/grooming, and locomotion level  REHAB POTENTIAL: Good  CLINICAL DECISION MAKING: Evolving/moderate complexity  EVALUATION COMPLEXITY: Moderate  GOALS: Goals reviewed with patient? No  SHORT TERM GOALS: Target date: 07/07/24  Pt to be I in HEP to improve lymphatic circulation   Baseline: Goal status: met   2.  Pt to be completing daily skin care to assist in preventing cellulitis  Baseline:  Goal status: met  3.  Pt to have lost 3 cm from Lt LE measurements Baseline:  Goal status: met   LONG TERM GOALS: Target date: 07/28/24  PT to be I in self manual techniques  Baseline:  Goal status: met  2.  Pt to understand that he should be wearing compression wear daily Baseline:  Goal status: met   3.  PT to have lost 4-5 cm from Lt LE daily to assist in preventing cellulitis  Baseline:  Goal status: on-going 4.  PT to have and be using a compression pump to assist in decreasing fluid in his LE                     Goal Status:  on going   PLAN:  PT FREQUENCY: 3x/week  PT DURATION: 6 weeks  PLANNED INTERVENTIONS: 97110-Therapeutic exercises, 97535- Self Care, and 02859- Manual therapy  PLAN FOR NEXT SESSION:  measure next treatment.  Continue total decongestive techniques.   Montie Metro, PT CLT [phone]' 07/14/2024, 12:15 PM

## 2024-07-16 ENCOUNTER — Ambulatory Visit (HOSPITAL_COMMUNITY): Admitting: Physical Therapy

## 2024-07-16 DIAGNOSIS — M6281 Muscle weakness (generalized): Secondary | ICD-10-CM | POA: Diagnosis not present

## 2024-07-16 DIAGNOSIS — R600 Localized edema: Secondary | ICD-10-CM | POA: Diagnosis not present

## 2024-07-16 DIAGNOSIS — I890 Lymphedema, not elsewhere classified: Secondary | ICD-10-CM | POA: Diagnosis not present

## 2024-07-16 DIAGNOSIS — R262 Difficulty in walking, not elsewhere classified: Secondary | ICD-10-CM | POA: Diagnosis not present

## 2024-07-16 DIAGNOSIS — M79672 Pain in left foot: Secondary | ICD-10-CM | POA: Diagnosis not present

## 2024-07-16 NOTE — Therapy (Addendum)
 OUTPATIENT PHYSICAL THERAPY LYMPHEDEMA Treatment  Patient Name: Devin [name] MRN: [id number] DOB:Aug 19, 1946, 78 y.o., male Today's Date: 07/16/2024   END OF SESSION:  PT End of Session - 07/16/24 1115     Visit Number 13    Number of Visits 18    Date for PT Re-Evaluation 07/28/24    Authorization Type medicare    PT Start Time 1016    PT Stop Time 1115    PT Time Calculation (min) 59 min    Activity Tolerance Patient tolerated treatment well    Behavior During Therapy [facility] for tasks assessed/performed                 Past Medical History:  Diagnosis Date   Arthritis    Asbestosis (HCC)    CAD (coronary artery disease)    Multivessel status post CABG March 2016 - LIMA to LAD, SVG to OM1, SVG to OM2, and SVG to PDA.   Essential hypertension    GERD (gastroesophageal reflux disease)    Melanoma of back (HCC)    Prostatic hypertrophy    Sleep apnea    Intolerant of CPAP   Past Surgical History:  Procedure Laterality Date   ANTERIOR CERVICAL DECOMP/DISCECTOMY FUSION     CARDIOVERSION N/A 01/28/2015   Procedure: CARDIOVERSION;  Surgeon: Dorn JULIANNA Ross, MD;  Location: [facility] ORS;  Service: Endoscopy;  Laterality: N/A;   CHOLECYSTECTOMY N/A 04/07/2020   Procedure: LAPAROSCOPIC CHOLECYSTECTOMY;  Surgeon: Kallie Manuelita BROCKS, MD;  Location: [facility] ORS;  Service: General;  Laterality: N/A;   CORONARY ARTERY BYPASS GRAFT N/A 12/29/2014   Procedure: CORONARY ARTERY BYPASS GRAFTING (CABG), ON PUMP, TIMES FOUR, USING LEFT INTERNAL MAMMARY ARTERY, RIGHT GREATER SAPHENOUS VEIN HARVESTED ENDOSCOPICALLY;  Surgeon: Maude Fleeta Ochoa, MD;  Location: [facility] OR;  Service: Open Heart Surgery;  Laterality: N/A;   KNEE ARTHROSCOPY Right    KNEE CARTILAGE SURGERY Left    LEFT HEART CATHETERIZATION WITH CORONARY ANGIOGRAM N/A 12/28/2014   Procedure: LEFT HEART CATHETERIZATION WITH CORONARY ANGIOGRAM;  Surgeon: Dorn JINNY Lesches, MD;  Location: [facility] CATH LAB;  Service: Cardiovascular;  Laterality: N/A;    MELANOMA EXCISION     back   SHOULDER OPEN ROTATOR CUFF REPAIR Left    TEE WITHOUT CARDIOVERSION N/A 12/29/2014   Procedure: TRANSESOPHAGEAL ECHOCARDIOGRAM (TEE);  Surgeon: Maude Fleeta Ochoa, MD;  Location: [facility] OR;  Service: Open Heart Surgery;  Laterality: N/A;   TEE WITHOUT CARDIOVERSION N/A 01/28/2015   Procedure: TRANSESOPHAGEAL ECHOCARDIOGRAM (TEE);  Surgeon: Dorn JULIANNA Ross, MD;  Location: [facility]S;  Service: Endoscopy;  Laterality: N/A;   Patient Active Problem List   Diagnosis Date Noted   Cough variant asthma vs UACS  02/17/2022   DOE (dyspnea on exertion) 12/07/2020   Calculus of gallbladder with acute cholecystitis without obstruction 04/06/2020   Hypogonadism in male 10/17/2019   Nocturia 10/17/2019   Erectile dysfunction due to arterial insufficiency 10/17/2019   Atrial fibrillation and flutter (HCC) 01/23/2015   Palpitations 01/23/2015   Hyperlipidemia 01/23/2015   BPH with urinary obstruction 01/23/2015   Chronic diastolic CHF (congestive heart failure) /HFpEF 01/23/2015   Atrial fibrillation with RVR (HCC) 01/23/2015   Obstructive sleep apnea--- refuses CPAP 01/18/2015   Essential hypertension 01/18/2015   Leg edema 01/18/2015   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   CAD (coronary artery disease), native coronary artery/H/o CABG x 4 12/26/2014    PCP: Sheryle Carwin, MD  REFERRING PROVIDER: Sheryle Carwin, MD  REFERRING DIAG:  Free Text Diagnosis  right  leg cellulitis    THERAPY DIAG:  Edema most likely lymphedema causing cellulitis in both LE   Rationale for Evaluation and Treatment: Rehabilitation  ONSET DATE: chronic at least  since 2016  SUBJECTIVE:                                                                                                                                                                                           SUBJECTIVE STATEMENT:  PT states that his legs did not itch as much.  He has noted that it is easier to get his shoes on.  PERTINENT HISTORY:  leg edema, CAD,  PAIN:  Are you having pain? Yes NPRS scale: 0-3/10  Pain location: knees  Pain orientation: Left  PAIN TYPE: itching and tightness  Pain description: intermittent  Aggravating factors: activity Relieving factors: mornings   PRECAUTIONS: Other: cellulitis   RED FLAGS: None   WEIGHT BEARING RESTRICTIONS: No  FALLS:  Has patient fallen in last 6 months? No  LIVING ENVIRONMENT: Lives with: lives with their spouse Lives in: House/apartment OCCUPATION: retired   LEISURE: walking   PRIOR LEVEL OF FUNCTION: Independent  PATIENT GOALS: less swelling no infection    OBJECTIVE: Note: Objective measures were completed at Evaluation unless otherwise noted.  COGNITION: Overall cognitive status: Within functional limits for tasks assessed   PALPATION: Noted induration   OBSERVATIONS / OTHER ASSESSMENTS: noted erythema ,positive stemmer sign   LYMPHEDEMA ASSESSMENTS:    LE LANDMARK RIGHT eval      At groin 06/16/24 06/25/24 07/02/24 07/09/24 07/16/24  30 cm proximal to suprapatella       20 cm proximal to suprapatella 67 65.3 65.3  65.7  66.5  10 cm proximal to suprapatella 57.8 57 55 55.7 54.8  At midpatella / popliteal crease 49.3 50.3 49 49 50.5  30 cm proximal to floor at lateral plantar foot 55 54.5 48 47.5 48.3  20 cm proximal to floor at lateral plantar foot 47.3 47.3 37.5 37 36.8  10 cm proximal to floor at lateral plantar foot 36.8 35.8 30.8 33 32.5  Circumference of ankle/heel 40.9 40.3 39.3 39.3 39.8  5 cm proximal to 1st MTP joint 29.7 30 30.5 28.4 29.7  Across MTP joint 28.9 28.8 29 28  28.2  Around proximal great toe 11 11 10.5 10.1 10.5  (Blank rows = not tested)  Leg length:  42cm, foot length 29cm  LE LANDMARK LEFT eval 06/25/24 07/02/24 07/09/24   At groin 06/16/24   07/09/24 07/16/24  30 cm proximal to suprapatella       20 cm proximal to suprapatella 64.8 64.8 62.8 63.5 65  10 cm proximal to suprapatella 58.7 58.7 56.5 56.7 57.8  At  midpatella / popliteal crease 52.4 48.7 49.8 49 51.5  30 cm proximal to floor at lateral plantar foot 53.6 53.5 46.5 46.8 47.2  20 cm proximal to floor at lateral plantar foot 46.7 47 38 35 36  10 cm proximal to floor at lateral plantar foot 38.4 36.7 32 32.5 32.4  Circumference of ankle/heel 40.5 36.4 38.2 38.8 38.3  5 cm proximal to 1st MTP joint 29.4 31.5 31 29.4 29.4  Across MTP joint 28.6 29.5 29.5 28.6 29.7  Around proximal great toe 11.6 11.8 11.5 11.5 11.3  (Blank rows = not tested) leg length 44 cm   FUNCTIONAL TESTS:  30 seconds chair stand test:  9 average for age is 11-15 2 minute walk test: 348 ft   TODAY'S TREATMENT:                                                                                                                              DATE:  07/16/24: Manual decongestive techniques to include: supraclavicular, short and deep abdominal, inguinal/axillary anastomosis and B LE.  Posterior completed in side lying position.    Application of  lotion and Benadryl prior multilayer short stretch bandages with 1/2in foam B foot to knee.   Education details: 07/14/24:  Explained that if pt goes even an hour without compression, edema will begin to build up.  Therapist demonstrated donning compression garment with and without a butler.  Told pt where they could buy a butler from if they so desired as pt and wife appeared to be interested in the butler.  07/02/24:  compression bandaging using 1/2 foam and multiple short stretch bandages.    06/18/24:  self manual techniques  06/16/24 : HEP, what lymphedema is and how it is controlled,(four aspects to treatment) Person educated: Patient Education method: Explanation, Demonstration, Verbal cues, and Handouts Education comprehension: verbalized understanding  HOME EXERCISE PROGRAM: Access Code: [email or web address] URL: [email or web address] Date: 06/16/2024 Prepared by: Montie Metro  Exercises - Seated Diaphragmatic  Breathing  - 1 x daily - 7 x weekly - 10 reps - 5 hold - Seated Cervical Sidebending AROM  - 1 x daily - 7 x weekly - 1 sets - 10 reps - 2-3 hold - Seated Cervical Rotation AROM  - 1 x daily - 7 x weekly - 1 sets - 10 reps - 2-3 hold - Seated Cervical Extension AROM  - 1 x daily - 7 x weekly - 1 sets - 10 reps - 2-3 hold - Seated Cervical Retraction  - 1 x daily - 7 x weekly - 1 sets - 10 reps - 2-3 hold - Shoulder Rolls in Sitting  - 1 x daily - 7 x weekly - 1 sets - 10 reps - 2-3 hold - Seated Sidebending Arms Overhead  - 1 x daily - 7 x weekly - 1 sets - 10 reps - 2-3 hold -  Seated Hip Abduction  - 1 x daily - 7 x weekly - 1 sets - 10 reps - 2-3 hold - Seated Long Arc Quad  - 1 x daily - 7 x weekly - 1 sets - 10 reps - 2-3 hold - Seated Heel Toe Raises  - 1 x daily - 7 x weekly - 1 sets - 10 reps - 2-3 hold - Seated Toe Curl  - 1 x daily - 7 x weekly - 1 sets - 10 reps - 2-3 hold  ASSESSMENT:  CLINICAL IMPRESSION:  Pt states that he has had some pain in his Lt toe for awhile.  He pulled some skin off of it and therapist now notes lymph fluid coming out from medial aspect of his nail.  Pt measured today generally up slightly still from pt being up without bandaging at family reunion.  New measurements sent to [facility] for foot and LE juxtafit.  Pt will continue to benefit from skilled PT for total decongestive techniques.    OBJECTIVE IMPAIRMENTS: difficulty walking, increased edema, pain, and decreased skin integrity .   ACTIVITY LIMITATIONS: dressing, hygiene/grooming, and locomotion level  REHAB POTENTIAL: Good  CLINICAL DECISION MAKING: Evolving/moderate complexity  EVALUATION COMPLEXITY: Moderate  GOALS: Goals reviewed with patient? No  SHORT TERM GOALS: Target date: 07/07/24  Pt to be I in HEP to improve lymphatic circulation  Baseline: Goal status: met   2.  Pt to be completing daily skin care to assist in preventing cellulitis  Baseline:  Goal status: met  3.   Pt to have lost 3 cm from Lt LE measurements Baseline:  Goal status: met   LONG TERM GOALS: Target date: 07/28/24  PT to be I in self manual techniques  Baseline:  Goal status: met  2.  Pt to understand that he should be wearing compression wear daily Baseline:  Goal status: met   3.  PT to have lost 4-5 cm from Lt LE daily to assist in preventing cellulitis  Baseline:  Goal status: on-going 4.  PT to have and be using a compression pump to assist in decreasing fluid in his LE                     Goal Status:  on going   PLAN:  PT FREQUENCY: 3x/week  PT DURATION: 6 weeks  PLANNED INTERVENTIONS: 97110-Therapeutic exercises, 97535- Self Care, and 02859- Manual therapy  PLAN FOR NEXT SESSION:  .  Continue total decongestive techniques.   Montie Metro, PT CLT [phone]' 07/16/2024, 11:15 AM

## 2024-07-18 ENCOUNTER — Ambulatory Visit (HOSPITAL_COMMUNITY)

## 2024-07-18 ENCOUNTER — Encounter (HOSPITAL_COMMUNITY): Payer: Self-pay

## 2024-07-18 DIAGNOSIS — I890 Lymphedema, not elsewhere classified: Secondary | ICD-10-CM | POA: Diagnosis not present

## 2024-07-18 DIAGNOSIS — R600 Localized edema: Secondary | ICD-10-CM

## 2024-07-18 DIAGNOSIS — R262 Difficulty in walking, not elsewhere classified: Secondary | ICD-10-CM

## 2024-07-18 DIAGNOSIS — M6281 Muscle weakness (generalized): Secondary | ICD-10-CM | POA: Diagnosis not present

## 2024-07-18 DIAGNOSIS — M79672 Pain in left foot: Secondary | ICD-10-CM | POA: Diagnosis not present

## 2024-07-18 NOTE — Therapy (Signed)
 OUTPATIENT PHYSICAL THERAPY LYMPHEDEMA Treatment  Patient Name: Devin [name] MRN: [id number] DOB:[birth year], 78 y.o., male Today's Date: 07/18/2024   END OF SESSION:  PT End of Session - 07/18/24 1113     Visit Number 14    Number of Visits 18    Date for Recertification  07/28/24    Authorization Type medicare    Progress Note Due on Visit 20   PN complete visit #10   PT Start Time 1005    PT Stop Time 1104    PT Time Calculation (min) 59 min    Activity Tolerance Patient tolerated treatment well    Behavior During Therapy [facility] for tasks assessed/performed                 Past Medical History:  Diagnosis Date   Arthritis    Asbestosis (HCC)    CAD (coronary artery disease)    Multivessel status post CABG March 2016 - LIMA to LAD, SVG to OM1, SVG to OM2, and SVG to PDA.   Essential hypertension    GERD (gastroesophageal reflux disease)    Melanoma of back (HCC)    Prostatic hypertrophy    Sleep apnea    Intolerant of CPAP   Past Surgical History:  Procedure Laterality Date   ANTERIOR CERVICAL DECOMP/DISCECTOMY FUSION     CARDIOVERSION N/A 01/28/2015   Procedure: CARDIOVERSION;  Surgeon: Dorn JULIANNA Ross, MD;  Location: [facility];  Service: Endoscopy;  Laterality: N/A;   CHOLECYSTECTOMY N/A 04/07/2020   Procedure: LAPAROSCOPIC CHOLECYSTECTOMY;  Surgeon: Kallie Manuelita BROCKS, MD;  Location: [facility];  Service: General;  Laterality: N/A;   CORONARY ARTERY BYPASS GRAFT N/A 12/29/2014   Procedure: CORONARY ARTERY BYPASS GRAFTING (CABG), ON PUMP, TIMES FOUR, USING LEFT INTERNAL MAMMARY ARTERY, RIGHT GREATER SAPHENOUS VEIN HARVESTED ENDOSCOPICALLY;  Surgeon: Maude Fleeta Ochoa, MD;  Location: [facility] OR;  Service: Open Heart Surgery;  Laterality: N/A;   KNEE ARTHROSCOPY Right    KNEE CARTILAGE SURGERY Left    LEFT HEART CATHETERIZATION WITH CORONARY ANGIOGRAM N/A 12/28/2014   Procedure: LEFT HEART CATHETERIZATION WITH CORONARY ANGIOGRAM;  Surgeon: Dorn JINNY Lesches, MD;  Location: [facility]  CATH LAB;  Service: Cardiovascular;  Laterality: N/A;   MELANOMA EXCISION     back   SHOULDER OPEN ROTATOR CUFF REPAIR Left    TEE WITHOUT CARDIOVERSION N/A 12/29/2014   Procedure: TRANSESOPHAGEAL ECHOCARDIOGRAM (TEE);  Surgeon: Maude Fleeta Ochoa, MD;  Location: [facility] OR;  Service: Open Heart Surgery;  Laterality: N/A;   TEE WITHOUT CARDIOVERSION N/A 01/28/2015   Procedure: TRANSESOPHAGEAL ECHOCARDIOGRAM (TEE);  Surgeon: Dorn JULIANNA Ross, MD;  Location: [facility];  Service: Endoscopy;  Laterality: N/A;   Patient Active Problem List   Diagnosis Date Noted   Cough variant asthma vs UACS  02/17/2022   DOE (dyspnea on exertion) 12/07/2020   Calculus of gallbladder with acute cholecystitis without obstruction 04/06/2020   Hypogonadism in male 10/17/2019   Nocturia 10/17/2019   Erectile dysfunction due to arterial insufficiency 10/17/2019   Atrial fibrillation and flutter (HCC) 01/23/2015   Palpitations 01/23/2015   Hyperlipidemia 01/23/2015   BPH with urinary obstruction 01/23/2015   Chronic diastolic CHF (congestive heart failure) /HFpEF 01/23/2015   Atrial fibrillation with RVR (HCC) 01/23/2015   Obstructive sleep apnea--- refuses CPAP 01/18/2015   Essential hypertension 01/18/2015   Leg edema 01/18/2015   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   CAD (coronary artery disease), native coronary artery/H/o CABG x 4 12/26/2014    PCP: Sheryle Carwin, MD  REFERRING PROVIDER: Sheryle Carwin, MD  REFERRING DIAG:  Free Text Diagnosis  right leg cellulitis    THERAPY DIAG:  Edema most likely lymphedema causing cellulitis in both LE   Rationale for Evaluation and Treatment: Rehabilitation  ONSET DATE: chronic at least  since 2016  SUBJECTIVE:                                                                                                                                                                                           SUBJECTIVE STATEMENT:  Legs are not as itchy.  No real pain, does have some discomfort  Lt ankle, feel related to the shoes he has.  Waiting to finish therapy to buy new shoes.   PERTINENT HISTORY: leg edema, CAD,  PAIN:  Are you having pain? Yes NPRS scale: 0-3/10  Pain location: knees  Pain orientation: Left  PAIN TYPE: itching and tightness  Pain description: intermittent  Aggravating factors: activity Relieving factors: mornings   PRECAUTIONS: Other: cellulitis   RED FLAGS: None   WEIGHT BEARING RESTRICTIONS: No  FALLS:  Has patient fallen in last 6 months? No  LIVING ENVIRONMENT: Lives with: lives with their spouse Lives in: House/apartment OCCUPATION: retired   LEISURE: walking   PRIOR LEVEL OF FUNCTION: Independent  PATIENT GOALS: less swelling no infection    OBJECTIVE: Note: Objective measures were completed at Evaluation unless otherwise noted.  COGNITION: Overall cognitive status: Within functional limits for tasks assessed   PALPATION: Noted induration   OBSERVATIONS / OTHER ASSESSMENTS: noted erythema ,positive stemmer sign   LYMPHEDEMA ASSESSMENTS:    LE LANDMARK RIGHT eval      At groin 06/16/24 06/25/24 07/02/24 07/09/24 07/16/24  30 cm proximal to suprapatella       20 cm proximal to suprapatella 67 65.3 65.3  65.7  66.5  10 cm proximal to suprapatella 57.8 57 55 55.7 54.8  At midpatella / popliteal crease 49.3 50.3 49 49 50.5  30 cm proximal to floor at lateral plantar foot 55 54.5 48 47.5 48.3  20 cm proximal to floor at lateral plantar foot 47.3 47.3 37.5 37 36.8  10 cm proximal to floor at lateral plantar foot 36.8 35.8 30.8 33 32.5  Circumference of ankle/heel 40.9 40.3 39.3 39.3 39.8  5 cm proximal to 1st MTP joint 29.7 30 30.5 28.4 29.7  Across MTP joint 28.9 28.8 29 28  28.2  Around proximal great toe 11 11 10.5 10.1 10.5  (Blank rows = not tested)  Leg length:  42cm, foot length 29cm  LE LANDMARK LEFT eval 06/25/24 07/02/24 07/09/24   At groin 06/16/24   07/09/24 07/16/24  30 cm proximal to suprapatella       20 cm  proximal to suprapatella 64.8 64.8 62.8 63.5 65  10 cm proximal to suprapatella 58.7 58.7 56.5 56.7 57.8  At midpatella / popliteal crease 52.4 48.7 49.8 49 51.5  30 cm proximal to floor at lateral plantar foot 53.6 53.5 46.5 46.8 47.2  20 cm proximal to floor at lateral plantar foot 46.7 47 38 35 36  10 cm proximal to floor at lateral plantar foot 38.4 36.7 32 32.5 32.4  Circumference of ankle/heel 40.5 36.4 38.2 38.8 38.3  5 cm proximal to 1st MTP joint 29.4 31.5 31 29.4 29.4  Across MTP joint 28.6 29.5 29.5 28.6 29.7  Around proximal great toe 11.6 11.8 11.5 11.5 11.3  (Blank rows = not tested) leg length 44 cm   FUNCTIONAL TESTS:  30 seconds chair stand test:  9 average for age is 11-15 2 minute walk test: 348 ft   TODAY'S TREATMENT:                                                                                                                              DATE:  07/18/24: Manual decongestive techniques to include: supraclavicular, short and deep abdominal, inguinal/axillary anastomosis and B LE.  Posterior completed in side lying position.    Application of  lotion and Benadryl prior multilayer short stretch bandages with 1/2in foam B foot to knee. 07/16/24: Manual decongestive techniques to include: supraclavicular, short and deep abdominal, inguinal/axillary anastomosis and B LE.  Posterior completed in side lying position.    Application of  lotion and Benadryl prior multilayer short stretch bandages with 1/2in foam B foot to knee.   Education details: 07/14/24:  Explained that if pt goes even an hour without compression, edema will begin to build up.  Therapist demonstrated donning compression garment with and without a butler.  Told pt where they could buy a butler from if they so desired as pt and wife appeared to be interested in the butler.  07/02/24:  compression bandaging using 1/2 foam and multiple short stretch bandages.    06/18/24:  self manual techniques  06/16/24 : HEP,  what lymphedema is and how it is controlled,(four aspects to treatment) Person educated: Patient Education method: Explanation, Demonstration, Verbal cues, and Handouts Education comprehension: verbalized understanding  HOME EXERCISE PROGRAM: Access Code: [email or web address] URL: [email or web address] Date: 06/16/2024 Prepared by: Montie Metro  Exercises - Seated Diaphragmatic Breathing  - 1 x daily - 7 x weekly - 10 reps - 5 hold - Seated Cervical Sidebending AROM  - 1 x daily - 7 x weekly - 1 sets - 10 reps - 2-3 hold - Seated Cervical Rotation AROM  - 1 x daily - 7 x weekly - 1 sets - 10 reps - 2-3 hold - Seated Cervical Extension AROM  - 1 x daily - 7 x weekly - 1 sets - 10 reps - 2-3 hold - Seated Cervical  Retraction  - 1 x daily - 7 x weekly - 1 sets - 10 reps - 2-3 hold - Shoulder Rolls in Sitting  - 1 x daily - 7 x weekly - 1 sets - 10 reps - 2-3 hold - Seated Sidebending Arms Overhead  - 1 x daily - 7 x weekly - 1 sets - 10 reps - 2-3 hold - Seated Hip Abduction  - 1 x daily - 7 x weekly - 1 sets - 10 reps - 2-3 hold - Seated Long Arc Quad  - 1 x daily - 7 x weekly - 1 sets - 10 reps - 2-3 hold - Seated Heel Toe Raises  - 1 x daily - 7 x weekly - 1 sets - 10 reps - 2-3 hold - Seated Toe Curl  - 1 x daily - 7 x weekly - 1 sets - 10 reps - 2-3 hold  ASSESSMENT:  CLINICAL IMPRESSION:   Manual decongestive techniques anterior and posterior in sidelying for inguinal to axillary anastomosis.  Applied Benadryl and lotion prior application of multilayer short stretch bandages with 1/2in foam.  Reports of comfort at EOS.  No drainage noted from nail today.    OBJECTIVE IMPAIRMENTS: difficulty walking, increased edema, pain, and decreased skin integrity .   ACTIVITY LIMITATIONS: dressing, hygiene/grooming, and locomotion level  REHAB POTENTIAL: Good  CLINICAL DECISION MAKING: Evolving/moderate complexity  EVALUATION COMPLEXITY: Moderate  GOALS: Goals reviewed  with patient? No  SHORT TERM GOALS: Target date: 07/07/24  Pt to be I in HEP to improve lymphatic circulation  Baseline: Goal status: met   2.  Pt to be completing daily skin care to assist in preventing cellulitis  Baseline:  Goal status: met  3.  Pt to have lost 3 cm from Lt LE measurements Baseline:  Goal status: met   LONG TERM GOALS: Target date: 07/28/24  PT to be I in self manual techniques  Baseline:  Goal status: met  2.  Pt to understand that he should be wearing compression wear daily Baseline:  Goal status: met   3.  PT to have lost 4-5 cm from Lt LE daily to assist in preventing cellulitis  Baseline:  Goal status: on-going 4.  PT to have and be using a compression pump to assist in decreasing fluid in his LE                     Goal Status:  on going   PLAN:  PT FREQUENCY: 3x/week  PT DURATION: 6 weeks  PLANNED INTERVENTIONS: 97110-Therapeutic exercises, 97535- Self Care, and 02859- Manual therapy  PLAN FOR NEXT SESSION:  .  Continue total decongestive techniques.   Augustin Mclean, LPTA/CLT; WILLAIM [phone]  07/18/2024, 11:18 AM

## 2024-07-21 ENCOUNTER — Ambulatory Visit (HOSPITAL_COMMUNITY)

## 2024-07-21 ENCOUNTER — Encounter (HOSPITAL_COMMUNITY): Payer: Self-pay

## 2024-07-21 ENCOUNTER — Other Ambulatory Visit

## 2024-07-21 DIAGNOSIS — E291 Testicular hypofunction: Secondary | ICD-10-CM | POA: Diagnosis not present

## 2024-07-21 DIAGNOSIS — I890 Lymphedema, not elsewhere classified: Secondary | ICD-10-CM | POA: Diagnosis not present

## 2024-07-21 DIAGNOSIS — D751 Secondary polycythemia: Secondary | ICD-10-CM | POA: Diagnosis not present

## 2024-07-21 DIAGNOSIS — M79672 Pain in left foot: Secondary | ICD-10-CM | POA: Diagnosis not present

## 2024-07-21 DIAGNOSIS — M6281 Muscle weakness (generalized): Secondary | ICD-10-CM

## 2024-07-21 DIAGNOSIS — R262 Difficulty in walking, not elsewhere classified: Secondary | ICD-10-CM | POA: Diagnosis not present

## 2024-07-21 DIAGNOSIS — R600 Localized edema: Secondary | ICD-10-CM

## 2024-07-21 DIAGNOSIS — N401 Enlarged prostate with lower urinary tract symptoms: Secondary | ICD-10-CM

## 2024-07-21 DIAGNOSIS — N138 Other obstructive and reflux uropathy: Secondary | ICD-10-CM | POA: Diagnosis not present

## 2024-07-21 NOTE — Therapy (Signed)
 OUTPATIENT PHYSICAL THERAPY LYMPHEDEMA Treatment  Patient Name: Devin [name] MRN: [id number] DOB:January 07, 1946, 78 y.o., male Today's Date: 07/21/2024   END OF SESSION:  PT End of Session - 07/21/24 1118     Visit Number 15    Number of Visits 18    Date for Recertification  07/28/24    Authorization Type medicare    Progress Note Due on Visit 20   PN complete visit #10   PT Start Time 1012    PT Stop Time 1110    PT Time Calculation (min) 58 min    Activity Tolerance Patient tolerated treatment well    Behavior During Therapy [facility] for tasks assessed/performed                 Past Medical History:  Diagnosis Date   Arthritis    Asbestosis (HCC)    CAD (coronary artery disease)    Multivessel status post CABG March 2016 - LIMA to LAD, SVG to OM1, SVG to OM2, and SVG to PDA.   Essential hypertension    GERD (gastroesophageal reflux disease)    Melanoma of back (HCC)    Prostatic hypertrophy    Sleep apnea    Intolerant of CPAP   Past Surgical History:  Procedure Laterality Date   ANTERIOR CERVICAL DECOMP/DISCECTOMY FUSION     CARDIOVERSION N/A 01/28/2015   Procedure: CARDIOVERSION;  Surgeon: Dorn JULIANNA Ross, MD;  Location: [facility];  Service: Endoscopy;  Laterality: N/A;   CHOLECYSTECTOMY N/A 04/07/2020   Procedure: LAPAROSCOPIC CHOLECYSTECTOMY;  Surgeon: Kallie Manuelita BROCKS, MD;  Location: [facility];  Service: General;  Laterality: N/A;   CORONARY ARTERY BYPASS GRAFT N/A 12/29/2014   Procedure: CORONARY ARTERY BYPASS GRAFTING (CABG), ON PUMP, TIMES FOUR, USING LEFT INTERNAL MAMMARY ARTERY, RIGHT GREATER SAPHENOUS VEIN HARVESTED ENDOSCOPICALLY;  Surgeon: Maude Fleeta Ochoa, MD;  Location: [facility] OR;  Service: Open Heart Surgery;  Laterality: N/A;   KNEE ARTHROSCOPY Right    KNEE CARTILAGE SURGERY Left    LEFT HEART CATHETERIZATION WITH CORONARY ANGIOGRAM N/A 12/28/2014   Procedure: LEFT HEART CATHETERIZATION WITH CORONARY ANGIOGRAM;  Surgeon: Dorn JINNY Lesches, MD;  Location: [facility]  CATH LAB;  Service: Cardiovascular;  Laterality: N/A;   MELANOMA EXCISION     back   SHOULDER OPEN ROTATOR CUFF REPAIR Left    TEE WITHOUT CARDIOVERSION N/A 12/29/2014   Procedure: TRANSESOPHAGEAL ECHOCARDIOGRAM (TEE);  Surgeon: Maude Fleeta Ochoa, MD;  Location: [facility] OR;  Service: Open Heart Surgery;  Laterality: N/A;   TEE WITHOUT CARDIOVERSION N/A 01/28/2015   Procedure: TRANSESOPHAGEAL ECHOCARDIOGRAM (TEE);  Surgeon: Dorn JULIANNA Ross, MD;  Location: [facility];  Service: Endoscopy;  Laterality: N/A;   Patient Active Problem List   Diagnosis Date Noted   Cough variant asthma vs UACS  02/17/2022   DOE (dyspnea on exertion) 12/07/2020   Calculus of gallbladder with acute cholecystitis without obstruction 04/06/2020   Hypogonadism in male 10/17/2019   Nocturia 10/17/2019   Erectile dysfunction due to arterial insufficiency 10/17/2019   Atrial fibrillation and flutter (HCC) 01/23/2015   Palpitations 01/23/2015   Hyperlipidemia 01/23/2015   BPH with urinary obstruction 01/23/2015   Chronic diastolic CHF (congestive heart failure) /HFpEF 01/23/2015   Atrial fibrillation with RVR (HCC) 01/23/2015   Obstructive sleep apnea--- refuses CPAP 01/18/2015   Essential hypertension 01/18/2015   Leg edema 01/18/2015   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   CAD (coronary artery disease), native coronary artery/H/o CABG x 4 12/26/2014    PCP: Sheryle Carwin, MD  REFERRING PROVIDER: Sheryle Carwin, MD  REFERRING DIAG:  Free Text Diagnosis  right leg cellulitis    THERAPY DIAG:  Edema most likely lymphedema causing cellulitis in both LE   Rationale for Evaluation and Treatment: Rehabilitation  ONSET DATE: chronic at least  since 2016  SUBJECTIVE:                                                                                                                                                                                           SUBJECTIVE STATEMENT:  Pt stated he has lost over 20# since began therapy.  Has reduced  fluid pills though continues to urinate large amounts.  Legs were super skinny when removed bandages last night, have increased since left house this morning.    PERTINENT HISTORY: leg edema, CAD,  PAIN:  Are you having pain? Yes NPRS scale: 0-3/10  Pain location: knees  Pain orientation: Left  PAIN TYPE: itching and tightness  Pain description: intermittent  Aggravating factors: activity Relieving factors: mornings   PRECAUTIONS: Other: cellulitis   RED FLAGS: None   WEIGHT BEARING RESTRICTIONS: No  FALLS:  Has patient fallen in last 6 months? No  LIVING ENVIRONMENT: Lives with: lives with their spouse Lives in: House/apartment OCCUPATION: retired   LEISURE: walking   PRIOR LEVEL OF FUNCTION: Independent  PATIENT GOALS: less swelling no infection    OBJECTIVE: Note: Objective measures were completed at Evaluation unless otherwise noted.  COGNITION: Overall cognitive status: Within functional limits for tasks assessed   PALPATION: Noted induration   OBSERVATIONS / OTHER ASSESSMENTS: noted erythema ,positive stemmer sign   LYMPHEDEMA ASSESSMENTS:    LE LANDMARK RIGHT eval      At groin 06/16/24 06/25/24 07/02/24 07/09/24 07/16/24  30 cm proximal to suprapatella       20 cm proximal to suprapatella 67 65.3 65.3  65.7  66.5  10 cm proximal to suprapatella 57.8 57 55 55.7 54.8  At midpatella / popliteal crease 49.3 50.3 49 49 50.5  30 cm proximal to floor at lateral plantar foot 55 54.5 48 47.5 48.3  20 cm proximal to floor at lateral plantar foot 47.3 47.3 37.5 37 36.8  10 cm proximal to floor at lateral plantar foot 36.8 35.8 30.8 33 32.5  Circumference of ankle/heel 40.9 40.3 39.3 39.3 39.8  5 cm proximal to 1st MTP joint 29.7 30 30.5 28.4 29.7  Across MTP joint 28.9 28.8 29 28  28.2  Around proximal great toe 11 11 10.5 10.1 10.5  (Blank rows = not tested)  Leg length:  42cm, foot length 29cm  LE LANDMARK LEFT eval 06/25/24 07/02/24 07/09/24  At groin  06/16/24   07/09/24 07/16/24  30 cm proximal to suprapatella       20 cm proximal to suprapatella 64.8 64.8 62.8 63.5 65  10 cm proximal to suprapatella 58.7 58.7 56.5 56.7 57.8  At midpatella / popliteal crease 52.4 48.7 49.8 49 51.5  30 cm proximal to floor at lateral plantar foot 53.6 53.5 46.5 46.8 47.2  20 cm proximal to floor at lateral plantar foot 46.7 47 38 35 36  10 cm proximal to floor at lateral plantar foot 38.4 36.7 32 32.5 32.4  Circumference of ankle/heel 40.5 36.4 38.2 38.8 38.3  5 cm proximal to 1st MTP joint 29.4 31.5 31 29.4 29.4  Across MTP joint 28.6 29.5 29.5 28.6 29.7  Around proximal great toe 11.6 11.8 11.5 11.5 11.3  (Blank rows = not tested) leg length 44 cm   FUNCTIONAL TESTS:  30 seconds chair stand test:  9 average for age is 11-15 2 minute walk test: 348 ft   TODAY'S TREATMENT:                                                                                                                              DATE:  07/21/24: Manual decongestive techniques to include: supraclavicular, short and deep abdominal, inguinal/axillary anastomosis and B LE.  Posterior completed in side lying position.    Application of  lotion prior multilayer short stretch bandages with 1/2in foam B foot to knee. 07/18/24: Manual decongestive techniques to include: supraclavicular, short and deep abdominal, inguinal/axillary anastomosis and B LE.  Posterior completed in side lying position.    Application of  lotion and Benadryl prior multilayer short stretch bandages with 1/2in foam B foot to knee. 07/16/24: Manual decongestive techniques to include: supraclavicular, short and deep abdominal, inguinal/axillary anastomosis and B LE.  Posterior completed in side lying position.    Application of  lotion and Benadryl prior multilayer short stretch bandages with 1/2in foam B foot to knee.   Education details: 07/14/24:  Explained that if pt goes even an hour without compression, edema will begin  to build up.  Therapist demonstrated donning compression garment with and without a butler.  Told pt where they could buy a butler from if they so desired as pt and wife appeared to be interested in the butler.  07/02/24:  compression bandaging using 1/2 foam and multiple short stretch bandages.    06/18/24:  self manual techniques  06/16/24 : HEP, what lymphedema is and how it is controlled,(four aspects to treatment) Person educated: Patient Education method: Explanation, Demonstration, Verbal cues, and Handouts Education comprehension: verbalized understanding  HOME EXERCISE PROGRAM: Access Code: [email or web address] URL: [email or web address] Date: 06/16/2024 Prepared by: Montie Metro  Exercises - Seated Diaphragmatic Breathing  - 1 x daily - 7 x weekly - 10 reps - 5 hold - Seated Cervical Sidebending AROM  - 1 x daily - 7 x weekly - 1 sets - 10 reps - 2-3  hold - Seated Cervical Rotation AROM  - 1 x daily - 7 x weekly - 1 sets - 10 reps - 2-3 hold - Seated Cervical Extension AROM  - 1 x daily - 7 x weekly - 1 sets - 10 reps - 2-3 hold - Seated Cervical Retraction  - 1 x daily - 7 x weekly - 1 sets - 10 reps - 2-3 hold - Shoulder Rolls in Sitting  - 1 x daily - 7 x weekly - 1 sets - 10 reps - 2-3 hold - Seated Sidebending Arms Overhead  - 1 x daily - 7 x weekly - 1 sets - 10 reps - 2-3 hold - Seated Hip Abduction  - 1 x daily - 7 x weekly - 1 sets - 10 reps - 2-3 hold - Seated Long Arc Quad  - 1 x daily - 7 x weekly - 1 sets - 10 reps - 2-3 hold - Seated Heel Toe Raises  - 1 x daily - 7 x weekly - 1 sets - 10 reps - 2-3 hold - Seated Toe Curl  - 1 x daily - 7 x weekly - 1 sets - 10 reps - 2-3 hold  ASSESSMENT:  CLINICAL IMPRESSION:   Noted some reduction in induration distal extremities during manual decongestive techniques.  Encouraged pt to wear compression garments during transportation to treatment to assist with gravity as he has a 30 minute drive to dept verbalized  understanding.  Applied good layer of lotion prior application of short stretch bandages with 1/2in foam.  Pt with new pair of shoes today that appear to support ankles better.  OBJECTIVE IMPAIRMENTS: difficulty walking, increased edema, pain, and decreased skin integrity .   ACTIVITY LIMITATIONS: dressing, hygiene/grooming, and locomotion level  REHAB POTENTIAL: Good  CLINICAL DECISION MAKING: Evolving/moderate complexity  EVALUATION COMPLEXITY: Moderate  GOALS: Goals reviewed with patient? No  SHORT TERM GOALS: Target date: 07/07/24  Pt to be I in HEP to improve lymphatic circulation  Baseline: Goal status: met   2.  Pt to be completing daily skin care to assist in preventing cellulitis  Baseline:  Goal status: met  3.  Pt to have lost 3 cm from Lt LE measurements Baseline:  Goal status: met   LONG TERM GOALS: Target date: 07/28/24  PT to be I in self manual techniques  Baseline:  Goal status: met  2.  Pt to understand that he should be wearing compression wear daily Baseline:  Goal status: met   3.  PT to have lost 4-5 cm from Lt LE daily to assist in preventing cellulitis  Baseline:  Goal status: on-going 4.  PT to have and be using a compression pump to assist in decreasing fluid in his LE                     Goal Status:  on going   PLAN:  PT FREQUENCY: 3x/week  PT DURATION: 6 weeks  PLANNED INTERVENTIONS: 97110-Therapeutic exercises, 97535- Self Care, and 02859- Manual therapy  PLAN FOR NEXT SESSION:  Continue total decongestive techniques.  Measure Wednesdays.    Augustin Mclean, LPTA/CLT; WILLAIM [phone]  07/21/2024, 11:27 AM

## 2024-07-22 ENCOUNTER — Ambulatory Visit: Payer: Self-pay

## 2024-07-22 ENCOUNTER — Other Ambulatory Visit

## 2024-07-22 DIAGNOSIS — H2513 Age-related nuclear cataract, bilateral: Secondary | ICD-10-CM | POA: Diagnosis not present

## 2024-07-22 DIAGNOSIS — H5203 Hypermetropia, bilateral: Secondary | ICD-10-CM | POA: Diagnosis not present

## 2024-07-22 DIAGNOSIS — H04123 Dry eye syndrome of bilateral lacrimal glands: Secondary | ICD-10-CM | POA: Diagnosis not present

## 2024-07-22 LAB — PSA, TOTAL AND FREE
PSA, Free Pct: 30 %
PSA, Free: 0.63 ng/mL
Prostate Specific Ag, Serum: 2.1 ng/mL (ref 0.0–4.0)

## 2024-07-22 LAB — HEMOGLOBIN AND HEMATOCRIT, BLOOD
Hematocrit: 50.4 % (ref 37.5–51.0)
Hemoglobin: 16.9 g/dL (ref 13.0–17.7)

## 2024-07-22 LAB — TESTOSTERONE: Testosterone: 383 ng/dL (ref 264–916)

## 2024-07-23 ENCOUNTER — Encounter (HOSPITAL_COMMUNITY)

## 2024-07-23 ENCOUNTER — Ambulatory Visit (HOSPITAL_COMMUNITY): Admitting: Physical Therapy

## 2024-07-23 DIAGNOSIS — I890 Lymphedema, not elsewhere classified: Secondary | ICD-10-CM

## 2024-07-23 DIAGNOSIS — R262 Difficulty in walking, not elsewhere classified: Secondary | ICD-10-CM | POA: Diagnosis not present

## 2024-07-23 DIAGNOSIS — R600 Localized edema: Secondary | ICD-10-CM

## 2024-07-23 DIAGNOSIS — M6281 Muscle weakness (generalized): Secondary | ICD-10-CM | POA: Diagnosis not present

## 2024-07-23 DIAGNOSIS — M79672 Pain in left foot: Secondary | ICD-10-CM | POA: Diagnosis not present

## 2024-07-23 NOTE — Therapy (Signed)
 OUTPATIENT PHYSICAL THERAPY LYMPHEDEMA Treatment  Patient Name: Devin [name] MRN: [id number] DOB:12-29-1945, 78 y.o., male Today's Date: 07/23/2024   END OF SESSION:  PT End of Session - 07/23/24 1659     Visit Number 16    Number of Visits 18    Date for Recertification  07/28/24    Authorization Type medicare    Progress Note Due on Visit 20   PN complete visit #10   PT Start Time 1546    PT Stop Time 1648    PT Time Calculation (min) 62 min    Activity Tolerance Patient tolerated treatment well    Behavior During Therapy [facility] for tasks assessed/performed                  Past Medical History:  Diagnosis Date   Arthritis    Asbestosis (HCC)    CAD (coronary artery disease)    Multivessel status post CABG March 2016 - LIMA to LAD, SVG to OM1, SVG to OM2, and SVG to PDA.   Essential hypertension    GERD (gastroesophageal reflux disease)    Melanoma of back (HCC)    Prostatic hypertrophy    Sleep apnea    Intolerant of CPAP   Past Surgical History:  Procedure Laterality Date   ANTERIOR CERVICAL DECOMP/DISCECTOMY FUSION     CARDIOVERSION N/A 01/28/2015   Procedure: CARDIOVERSION;  Surgeon: Dorn JULIANNA Ross, MD;  Location: [facility] ORS;  Service: Endoscopy;  Laterality: N/A;   CHOLECYSTECTOMY N/A 04/07/2020   Procedure: LAPAROSCOPIC CHOLECYSTECTOMY;  Surgeon: Kallie Manuelita BROCKS, MD;  Location: [facility] ORS;  Service: General;  Laterality: N/A;   CORONARY ARTERY BYPASS GRAFT N/A 12/29/2014   Procedure: CORONARY ARTERY BYPASS GRAFTING (CABG), ON PUMP, TIMES FOUR, USING LEFT INTERNAL MAMMARY ARTERY, RIGHT GREATER SAPHENOUS VEIN HARVESTED ENDOSCOPICALLY;  Surgeon: Maude Fleeta Ochoa, MD;  Location: [facility] OR;  Service: Open Heart Surgery;  Laterality: N/A;   KNEE ARTHROSCOPY Right    KNEE CARTILAGE SURGERY Left    LEFT HEART CATHETERIZATION WITH CORONARY ANGIOGRAM N/A 12/28/2014   Procedure: LEFT HEART CATHETERIZATION WITH CORONARY ANGIOGRAM;  Surgeon: Dorn JINNY Lesches, MD;  Location:  [facility] CATH LAB;  Service: Cardiovascular;  Laterality: N/A;   MELANOMA EXCISION     back   SHOULDER OPEN ROTATOR CUFF REPAIR Left    TEE WITHOUT CARDIOVERSION N/A 12/29/2014   Procedure: TRANSESOPHAGEAL ECHOCARDIOGRAM (TEE);  Surgeon: Maude Fleeta Ochoa, MD;  Location: [facility] OR;  Service: Open Heart Surgery;  Laterality: N/A;   TEE WITHOUT CARDIOVERSION N/A 01/28/2015   Procedure: TRANSESOPHAGEAL ECHOCARDIOGRAM (TEE);  Surgeon: Dorn JULIANNA Ross, MD;  Location: [facility]S;  Service: Endoscopy;  Laterality: N/A;   Patient Active Problem List   Diagnosis Date Noted   Cough variant asthma vs UACS  02/17/2022   DOE (dyspnea on exertion) 12/07/2020   Calculus of gallbladder with acute cholecystitis without obstruction 04/06/2020   Hypogonadism in male 10/17/2019   Nocturia 10/17/2019   Erectile dysfunction due to arterial insufficiency 10/17/2019   Atrial fibrillation and flutter (HCC) 01/23/2015   Palpitations 01/23/2015   Hyperlipidemia 01/23/2015   BPH with urinary obstruction 01/23/2015   Chronic diastolic CHF (congestive heart failure) /HFpEF 01/23/2015   Atrial fibrillation with RVR (HCC) 01/23/2015   Obstructive sleep apnea--- refuses CPAP 01/18/2015   Essential hypertension 01/18/2015   Leg edema 01/18/2015   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   CAD (coronary artery disease), native coronary artery/H/o CABG x 4 12/26/2014    PCP: Sheryle Carwin, MD  REFERRING PROVIDER: Sheryle Carwin, MD  REFERRING DIAG:  Free Text Diagnosis  right leg cellulitis    THERAPY DIAG:  Edema most likely lymphedema causing cellulitis in both LE   Rationale for Evaluation and Treatment: Rehabilitation  ONSET DATE: chronic at least  since 2016  SUBJECTIVE:                                                                                                                                                                                           SUBJECTIVE STATEMENT:  PT had to r/s appt today to later time due to conflict.   Comes today wearing thigh high compression.  Reports he removed his bandages last night and donned his stockings this morning.      PERTINENT HISTORY: leg edema, CAD,  PAIN:  Are you having pain? Yes NPRS scale: 0-3/10  Pain location: knees  Pain orientation: Left  PAIN TYPE: itching and tightness  Pain description: intermittent  Aggravating factors: activity Relieving factors: mornings   PRECAUTIONS: Other: cellulitis   RED FLAGS: None   WEIGHT BEARING RESTRICTIONS: No  FALLS:  Has patient fallen in last 6 months? No  LIVING ENVIRONMENT: Lives with: lives with their spouse Lives in: House/apartment OCCUPATION: retired   LEISURE: walking   PRIOR LEVEL OF FUNCTION: Independent  PATIENT GOALS: less swelling no infection    OBJECTIVE: Note: Objective measures were completed at Evaluation unless otherwise noted.  COGNITION: Overall cognitive status: Within functional limits for tasks assessed   PALPATION: Noted induration   OBSERVATIONS / OTHER ASSESSMENTS: noted erythema ,positive stemmer sign   LYMPHEDEMA ASSESSMENTS:    LE LANDMARK RIGHT eval 06/25/24 07/02/24 07/09/24 07/16/24 07/23/24  At groin        30 cm proximal to suprapatella        20 cm proximal to suprapatella 67 65.3 65.3  65.7  66.5 65  10 cm proximal to suprapatella 57.8 57 55 55.7 54.8 53.5  At midpatella / popliteal crease 49.3 50.3 49 49 50.5 50.5  30 cm proximal to floor at lateral plantar foot 55 54.5 48 47.5 48.3 49  20 cm proximal to floor at lateral plantar foot 47.3 47.3 37.5 37 36.8 37  10 cm proximal to floor at lateral plantar foot 36.8 35.8 30.8 33 32.5 31  Circumference of ankle/heel 40.9 40.3 39.3 39.3 39.8 38  5 cm proximal to 1st MTP joint 29.7 30 30.5 28.4 29.7 29  Across MTP joint 28.9 28.8 29 28  28.2 27.3  Around proximal great toe 11 11 10.5 10.1 10.5 9.3  (Blank rows = not tested)  Leg length:  42cm, foot length  29cm  LE LANDMARK LEFT eval 06/25/24 07/02/24 07/09/24 07/16/24  07/23/24  At groin        30 cm proximal to suprapatella        20 cm proximal to suprapatella 64.8 64.8 62.8 63.5 65 62  10 cm proximal to suprapatella 58.7 58.7 56.5 56.7 57.8 55  At midpatella / popliteal crease 52.4 48.7 49.8 49 51.5 48.5  30 cm proximal to floor at lateral plantar foot 53.6 53.5 46.5 46.8 47.2 46.5  20 cm proximal to floor at lateral plantar foot 46.7 47 38 35 36 37  10 cm proximal to floor at lateral plantar foot 38.4 36.7 32 32.5 32.4 29  Circumference of ankle/heel 40.5 36.4 38.2 38.8 38.3 38.3  5 cm proximal to 1st MTP joint 29.4 31.5 31 29.4 29.4 29.4  Across MTP joint 28.6 29.5 29.5 28.6 29.7 27  Around proximal great toe 11.6 11.8 11.5 11.5 11.3 10.3  (Blank rows = not tested) leg length 44 cm   FUNCTIONAL TESTS:  30 seconds chair stand test:  9 average for age is 11-15 2 minute walk test: 348 ft   TODAY'S TREATMENT:                                                                                                                              DATE:  07/23/24: Measurements Manual decongestive techniques to include: supraclavicular, short and deep abdominal, inguinal/axillary anastomosis and B LE.  Posterior not completed today due to time    Application of  lotion, soft netting , multilayer short stretch bandages with 1/2in foam B foot to knee.  07/21/24: Manual decongestive techniques to include: supraclavicular, short and deep abdominal, inguinal/axillary anastomosis and B LE.  Posterior completed in side lying position.    Application of  lotion prior multilayer short stretch bandages with 1/2in foam B foot to knee.  07/18/24: Manual decongestive techniques to include: supraclavicular, short and deep abdominal, inguinal/axillary anastomosis and B LE.  Posterior completed in side lying position.    Application of  lotion and Benadryl prior multilayer short stretch bandages with 1/2in foam B foot to knee.  07/16/24: Manual decongestive techniques to include:  supraclavicular, short and deep abdominal, inguinal/axillary anastomosis and B LE.  Posterior completed in side lying position.    Application of  lotion and Benadryl prior multilayer short stretch bandages with 1/2in foam B foot to knee.   Education details: 07/14/24:  Explained that if pt goes even an hour without compression, edema will begin to build up.  Therapist demonstrated donning compression garment with and without a butler.  Told pt where they could buy a butler from if they so desired as pt and wife appeared to be interested in the butler.  07/02/24:  compression bandaging using 1/2 foam and multiple short stretch bandages.    06/18/24:  self manual techniques  06/16/24 : HEP, what lymphedema is and how it is controlled,(four aspects to treatment)  Person educated: Patient Education method: Explanation, Demonstration, Verbal cues, and Handouts Education comprehension: verbalized understanding  HOME EXERCISE PROGRAM: Access Code: [email or web address] URL: [email or web address] Date: 06/16/2024 Prepared by: Montie Metro  Exercises - Seated Diaphragmatic Breathing  - 1 x daily - 7 x weekly - 10 reps - 5 hold - Seated Cervical Sidebending AROM  - 1 x daily - 7 x weekly - 1 sets - 10 reps - 2-3 hold - Seated Cervical Rotation AROM  - 1 x daily - 7 x weekly - 1 sets - 10 reps - 2-3 hold - Seated Cervical Extension AROM  - 1 x daily - 7 x weekly - 1 sets - 10 reps - 2-3 hold - Seated Cervical Retraction  - 1 x daily - 7 x weekly - 1 sets - 10 reps - 2-3 hold - Shoulder Rolls in Sitting  - 1 x daily - 7 x weekly - 1 sets - 10 reps - 2-3 hold - Seated Sidebending Arms Overhead  - 1 x daily - 7 x weekly - 1 sets - 10 reps - 2-3 hold - Seated Hip Abduction  - 1 x daily - 7 x weekly - 1 sets - 10 reps - 2-3 hold - Seated Long Arc Quad  - 1 x daily - 7 x weekly - 1 sets - 10 reps - 2-3 hold - Seated Heel Toe Raises  - 1 x daily - 7 x weekly - 1 sets - 10 reps - 2-3 hold -  Seated Toe Curl  - 1 x daily - 7 x weekly - 1 sets - 10 reps - 2-3 hold  ASSESSMENT:  CLINICAL IMPRESSION:   LE's measured today with noted reduction bilaterally.  Manual completed for bil LE's, anterior only due to time (worked into schedule).  Still with min-mod amount of induration in distal lower extremities but reduces with manual techniques.  Wearing compression garments seems to be helping as continues to reduce, however requires assistance to get garments all the way off (pt can push down past knees then unable to get further).  Pt reported interest in sock butler and will need instruction/demonstration on how to use for donning.      OBJECTIVE IMPAIRMENTS: difficulty walking, increased edema, pain, and decreased skin integrity .   ACTIVITY LIMITATIONS: dressing, hygiene/grooming, and locomotion level  REHAB POTENTIAL: Good  CLINICAL DECISION MAKING: Evolving/moderate complexity  EVALUATION COMPLEXITY: Moderate  GOALS: Goals reviewed with patient? No  SHORT TERM GOALS: Target date: 07/07/24  Pt to be I in HEP to improve lymphatic circulation  Baseline: Goal status: met   2.  Pt to be completing daily skin care to assist in preventing cellulitis  Baseline:  Goal status: met  3.  Pt to have lost 3 cm from Lt LE measurements Baseline:  Goal status: met   LONG TERM GOALS: Target date: 07/28/24  PT to be I in self manual techniques  Baseline:  Goal status: met  2.  Pt to understand that he should be wearing compression wear daily Baseline:  Goal status: met   3.  PT to have lost 4-5 cm from Lt LE daily to assist in preventing cellulitis  Baseline:  Goal status: on-going 4.  PT to have and be using a compression pump to assist in decreasing fluid in his LE                     Goal Status:  on going   PLAN:  PT FREQUENCY:  3x/week  PT DURATION: 6 weeks  PLANNED INTERVENTIONS: 97110-Therapeutic exercises, 97535- Self Care, and 02859- Manual therapy  PLAN FOR  NEXT SESSION:  Continue total decongestive techniques.  Measure Wednesdays.    Greig KATHEE Fuse, PTA/CLT [facility] Ph: [phone]  07/23/2024, 5:00 PM

## 2024-07-25 ENCOUNTER — Ambulatory Visit (HOSPITAL_COMMUNITY)

## 2024-07-25 ENCOUNTER — Encounter (HOSPITAL_COMMUNITY): Payer: Self-pay

## 2024-07-25 DIAGNOSIS — M6281 Muscle weakness (generalized): Secondary | ICD-10-CM

## 2024-07-25 DIAGNOSIS — M79672 Pain in left foot: Secondary | ICD-10-CM

## 2024-07-25 DIAGNOSIS — R600 Localized edema: Secondary | ICD-10-CM | POA: Diagnosis not present

## 2024-07-25 DIAGNOSIS — I890 Lymphedema, not elsewhere classified: Secondary | ICD-10-CM

## 2024-07-25 DIAGNOSIS — R262 Difficulty in walking, not elsewhere classified: Secondary | ICD-10-CM | POA: Diagnosis not present

## 2024-07-25 NOTE — Therapy (Signed)
 OUTPATIENT PHYSICAL THERAPY LYMPHEDEMA Treatment  Patient Name: Devin [name] MRN: [id number] DOB:[birth year], 78 y.o., male Today's Date: 07/25/2024   END OF SESSION:  PT End of Session - 07/25/24 1009     Visit Number 17    Number of Visits 18    Date for Recertification  07/28/24    Authorization Type medicare    Progress Note Due on Visit 20   PN complete visit #10   PT Start Time 1014    PT Stop Time 1110    PT Time Calculation (min) 56 min    Activity Tolerance Patient tolerated treatment well    Behavior During Therapy [facility] for tasks assessed/performed                  Past Medical History:  Diagnosis Date   Arthritis    Asbestosis (HCC)    CAD (coronary artery disease)    Multivessel status post CABG March 2016 - LIMA to LAD, SVG to OM1, SVG to OM2, and SVG to PDA.   Essential hypertension    GERD (gastroesophageal reflux disease)    Melanoma of back (HCC)    Prostatic hypertrophy    Sleep apnea    Intolerant of CPAP   Past Surgical History:  Procedure Laterality Date   ANTERIOR CERVICAL DECOMP/DISCECTOMY FUSION     CARDIOVERSION N/A 01/28/2015   Procedure: CARDIOVERSION;  Surgeon: Dorn JULIANNA Ross, MD;  Location: [facility] ORS;  Service: Endoscopy;  Laterality: N/A;   CHOLECYSTECTOMY N/A 04/07/2020   Procedure: LAPAROSCOPIC CHOLECYSTECTOMY;  Surgeon: Kallie Manuelita BROCKS, MD;  Location: [facility]S;  Service: General;  Laterality: N/A;   CORONARY ARTERY BYPASS GRAFT N/A 12/29/2014   Procedure: CORONARY ARTERY BYPASS GRAFTING (CABG), ON PUMP, TIMES FOUR, USING LEFT INTERNAL MAMMARY ARTERY, RIGHT GREATER SAPHENOUS VEIN HARVESTED ENDOSCOPICALLY;  Surgeon: Maude Fleeta Ochoa, MD;  Location: [facility] OR;  Service: Open Heart Surgery;  Laterality: N/A;   KNEE ARTHROSCOPY Right    KNEE CARTILAGE SURGERY Left    LEFT HEART CATHETERIZATION WITH CORONARY ANGIOGRAM N/A 12/28/2014   Procedure: LEFT HEART CATHETERIZATION WITH CORONARY ANGIOGRAM;  Surgeon: Dorn JINNY Lesches, MD;  Location:  [facility] CATH LAB;  Service: Cardiovascular;  Laterality: N/A;   MELANOMA EXCISION     back   SHOULDER OPEN ROTATOR CUFF REPAIR Left    TEE WITHOUT CARDIOVERSION N/A 12/29/2014   Procedure: TRANSESOPHAGEAL ECHOCARDIOGRAM (TEE);  Surgeon: Maude Fleeta Ochoa, MD;  Location: [facility] OR;  Service: Open Heart Surgery;  Laterality: N/A;   TEE WITHOUT CARDIOVERSION N/A 01/28/2015   Procedure: TRANSESOPHAGEAL ECHOCARDIOGRAM (TEE);  Surgeon: Dorn JULIANNA Ross, MD;  Location: [facility]S;  Service: Endoscopy;  Laterality: N/A;   Patient Active Problem List   Diagnosis Date Noted   Cough variant asthma vs UACS  02/17/2022   DOE (dyspnea on exertion) 12/07/2020   Calculus of gallbladder with acute cholecystitis without obstruction 04/06/2020   Hypogonadism in male 10/17/2019   Nocturia 10/17/2019   Erectile dysfunction due to arterial insufficiency 10/17/2019   Atrial fibrillation and flutter (HCC) 01/23/2015   Palpitations 01/23/2015   Hyperlipidemia 01/23/2015   BPH with urinary obstruction 01/23/2015   Chronic diastolic CHF (congestive heart failure) /HFpEF 01/23/2015   Atrial fibrillation with RVR (HCC) 01/23/2015   Obstructive sleep apnea--- refuses CPAP 01/18/2015   Essential hypertension 01/18/2015   Leg edema 01/18/2015   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   CAD (coronary artery disease), native coronary artery/H/o CABG x 4 12/26/2014    PCP: Sheryle Carwin, MD  REFERRING PROVIDER: Sheryle Carwin, MD  REFERRING DIAG:  Free Text Diagnosis  right leg cellulitis    THERAPY DIAG:  Edema most likely lymphedema causing cellulitis in both LE   Rationale for Evaluation and Treatment: Rehabilitation  ONSET DATE: chronic at least  since 2016  SUBJECTIVE:                                                                                                                                                                                           SUBJECTIVE STATEMENT:  Received pump at home yesterday.  Attempted to wear  compression garments into dept but unable due to knee pain while attempted to donn garment.  WIfe plans to order butler soon.    PERTINENT HISTORY: leg edema, CAD,  PAIN:  Are you having pain? Yes NPRS scale: 0-3/10  Pain location: knees  Pain orientation: Left  PAIN TYPE: itching and tightness  Pain description: intermittent  Aggravating factors: activity Relieving factors: mornings   PRECAUTIONS: Other: cellulitis   RED FLAGS: None   WEIGHT BEARING RESTRICTIONS: No  FALLS:  Has patient fallen in last 6 months? No  LIVING ENVIRONMENT: Lives with: lives with their spouse Lives in: House/apartment OCCUPATION: retired   LEISURE: walking   PRIOR LEVEL OF FUNCTION: Independent  PATIENT GOALS: less swelling no infection    OBJECTIVE: Note: Objective measures were completed at Evaluation unless otherwise noted.  COGNITION: Overall cognitive status: Within functional limits for tasks assessed   PALPATION: Noted induration   OBSERVATIONS / OTHER ASSESSMENTS: noted erythema ,positive stemmer sign   LYMPHEDEMA ASSESSMENTS:    LE LANDMARK RIGHT eval 06/25/24 07/02/24 07/09/24 07/16/24 07/23/24  At groin        30 cm proximal to suprapatella        20 cm proximal to suprapatella 67 65.3 65.3  65.7  66.5 65  10 cm proximal to suprapatella 57.8 57 55 55.7 54.8 53.5  At midpatella / popliteal crease 49.3 50.3 49 49 50.5 50.5  30 cm proximal to floor at lateral plantar foot 55 54.5 48 47.5 48.3 49  20 cm proximal to floor at lateral plantar foot 47.3 47.3 37.5 37 36.8 37  10 cm proximal to floor at lateral plantar foot 36.8 35.8 30.8 33 32.5 31  Circumference of ankle/heel 40.9 40.3 39.3 39.3 39.8 38  5 cm proximal to 1st MTP joint 29.7 30 30.5 28.4 29.7 29  Across MTP joint 28.9 28.8 29 28  28.2 27.3  Around proximal great toe 11 11 10.5 10.1 10.5 9.3  (Blank rows = not tested)  Leg length:  42cm, foot length 29cm  LE LANDMARK  LEFT eval 06/25/24 07/02/24 07/09/24 07/16/24  07/23/24  At groin        30 cm proximal to suprapatella        20 cm proximal to suprapatella 64.8 64.8 62.8 63.5 65 62  10 cm proximal to suprapatella 58.7 58.7 56.5 56.7 57.8 55  At midpatella / popliteal crease 52.4 48.7 49.8 49 51.5 48.5  30 cm proximal to floor at lateral plantar foot 53.6 53.5 46.5 46.8 47.2 46.5  20 cm proximal to floor at lateral plantar foot 46.7 47 38 35 36 37  10 cm proximal to floor at lateral plantar foot 38.4 36.7 32 32.5 32.4 29  Circumference of ankle/heel 40.5 36.4 38.2 38.8 38.3 38.3  5 cm proximal to 1st MTP joint 29.4 31.5 31 29.4 29.4 29.4  Across MTP joint 28.6 29.5 29.5 28.6 29.7 27  Around proximal great toe 11.6 11.8 11.5 11.5 11.3 10.3  (Blank rows = not tested) leg length 44 cm   FUNCTIONAL TESTS:  30 seconds chair stand test:  9 average for age is 11-15 2 minute walk test: 348 ft   TODAY'S TREATMENT:                                                                                                                              DATE:  07/23/24: Measurements Manual decongestive techniques to include: supraclavicular, short and deep abdominal, inguinal/axillary anastomosis and B LE.  Posterior not completed today due to time    Application of  lotion, soft netting , multilayer short stretch bandages with 1/2in foam B foot to knee.  07/21/24: Manual decongestive techniques to include: supraclavicular, short and deep abdominal, inguinal/axillary anastomosis and B LE.  Posterior completed in side lying position.    Application of  lotion prior multilayer short stretch bandages with 1/2in foam B foot to knee.  07/18/24: Manual decongestive techniques to include: supraclavicular, short and deep abdominal, inguinal/axillary anastomosis and B LE.  Posterior completed in side lying position.    Application of  lotion and Benadryl prior multilayer short stretch bandages with 1/2in foam B foot to knee.  07/16/24: Manual decongestive techniques to include:  supraclavicular, short and deep abdominal, inguinal/axillary anastomosis and B LE.  Posterior completed in side lying position.    Application of  lotion and Benadryl prior multilayer short stretch bandages with 1/2in foam B foot to knee.   Education details: 07/14/24:  Explained that if pt goes even an hour without compression, edema will begin to build up.  Therapist demonstrated donning compression garment with and without a butler.  Told pt where they could buy a butler from if they so desired as pt and wife appeared to be interested in the butler.  07/02/24:  compression bandaging using 1/2 foam and multiple short stretch bandages.    06/18/24:  self manual techniques  06/16/24 : HEP, what lymphedema is and how it is controlled,(four aspects to treatment) Person educated: Patient Education  method: Explanation, Demonstration, Verbal cues, and Handouts Education comprehension: verbalized understanding  HOME EXERCISE PROGRAM: Access Code: [email or web address] URL: [email or web address] Date: 06/16/2024 Prepared by: Montie Metro  Exercises - Seated Diaphragmatic Breathing  - 1 x daily - 7 x weekly - 10 reps - 5 hold - Seated Cervical Sidebending AROM  - 1 x daily - 7 x weekly - 1 sets - 10 reps - 2-3 hold - Seated Cervical Rotation AROM  - 1 x daily - 7 x weekly - 1 sets - 10 reps - 2-3 hold - Seated Cervical Extension AROM  - 1 x daily - 7 x weekly - 1 sets - 10 reps - 2-3 hold - Seated Cervical Retraction  - 1 x daily - 7 x weekly - 1 sets - 10 reps - 2-3 hold - Shoulder Rolls in Sitting  - 1 x daily - 7 x weekly - 1 sets - 10 reps - 2-3 hold - Seated Sidebending Arms Overhead  - 1 x daily - 7 x weekly - 1 sets - 10 reps - 2-3 hold - Seated Hip Abduction  - 1 x daily - 7 x weekly - 1 sets - 10 reps - 2-3 hold - Seated Long Arc Quad  - 1 x daily - 7 x weekly - 1 sets - 10 reps - 2-3 hold - Seated Heel Toe Raises  - 1 x daily - 7 x weekly - 1 sets - 10 reps - 2-3 hold -  Seated Toe Curl  - 1 x daily - 7 x weekly - 1 sets - 10 reps - 2-3 hold  ASSESSMENT:  CLINICAL IMPRESSION:   Pump was delivered yesterday, instructed to complete without compression garments on.  Manual decongestive techniques complete anterior and posterior in sidelying, induration is present distal lower extremities though has reduced some compared to last session.  Pt stated he attempted to put on compression garments for the ride to therapy today but was unable to donn due to knee pain while trying.  Pt stated he felt the feet were too small in compressoin garment, pt encouraged to get apt with podiatrist for toe nail trim and encouraged to order shoe size up to see if more comfortable in shoes when ordering compression garments in the future.  Reviewed benefits of butler, pt stated wife planned on purchasing soon.  Lotion applied prior application of short stretch bandages with 1/2in foam and reports of comfort at EOS.    OBJECTIVE IMPAIRMENTS: difficulty walking, increased edema, pain, and decreased skin integrity .   ACTIVITY LIMITATIONS: dressing, hygiene/grooming, and locomotion level  REHAB POTENTIAL: Good  CLINICAL DECISION MAKING: Evolving/moderate complexity  EVALUATION COMPLEXITY: Moderate  GOALS: Goals reviewed with patient? No  SHORT TERM GOALS: Target date: 07/07/24  Pt to be I in HEP to improve lymphatic circulation  Baseline: Goal status: met   2.  Pt to be completing daily skin care to assist in preventing cellulitis  Baseline:  Goal status: met  3.  Pt to have lost 3 cm from Lt LE measurements Baseline:  Goal status: met   LONG TERM GOALS: Target date: 07/28/24  PT to be I in self manual techniques  Baseline:  Goal status: met  2.  Pt to understand that he should be wearing compression wear daily Baseline:  Goal status: met   3.  PT to have lost 4-5 cm from Lt LE daily to assist in preventing cellulitis  Baseline:  Goal status: on-going 4.  PT to  have  and be using a compression pump to assist in decreasing fluid in his LE                     Goal Status:  on going   PLAN:  PT FREQUENCY: 3x/week  PT DURATION: 6 weeks  PLANNED INTERVENTIONS: 97110-Therapeutic exercises, 97535- Self Care, and 02859- Manual therapy  PLAN FOR NEXT SESSION:  Continue total decongestive techniques.  Measure Wednesdays.    Augustin Mclean, LPTA/CLT; [facility] [phone]   07/25/2024, 11:18 AM

## 2024-07-28 ENCOUNTER — Encounter: Payer: Self-pay | Admitting: Urology

## 2024-07-28 ENCOUNTER — Ambulatory Visit (HOSPITAL_COMMUNITY): Admitting: Physical Therapy

## 2024-07-28 ENCOUNTER — Ambulatory Visit: Admitting: Urology

## 2024-07-28 VITALS — BP 136/67 | HR 76

## 2024-07-28 DIAGNOSIS — N401 Enlarged prostate with lower urinary tract symptoms: Secondary | ICD-10-CM | POA: Diagnosis not present

## 2024-07-28 DIAGNOSIS — I890 Lymphedema, not elsewhere classified: Secondary | ICD-10-CM | POA: Diagnosis not present

## 2024-07-28 DIAGNOSIS — E291 Testicular hypofunction: Secondary | ICD-10-CM | POA: Diagnosis not present

## 2024-07-28 DIAGNOSIS — M79672 Pain in left foot: Secondary | ICD-10-CM | POA: Diagnosis not present

## 2024-07-28 DIAGNOSIS — N138 Other obstructive and reflux uropathy: Secondary | ICD-10-CM | POA: Diagnosis not present

## 2024-07-28 DIAGNOSIS — N320 Bladder-neck obstruction: Secondary | ICD-10-CM

## 2024-07-28 DIAGNOSIS — N529 Male erectile dysfunction, unspecified: Secondary | ICD-10-CM

## 2024-07-28 DIAGNOSIS — R262 Difficulty in walking, not elsewhere classified: Secondary | ICD-10-CM | POA: Diagnosis not present

## 2024-07-28 DIAGNOSIS — R600 Localized edema: Secondary | ICD-10-CM | POA: Diagnosis not present

## 2024-07-28 DIAGNOSIS — R7989 Other specified abnormal findings of blood chemistry: Secondary | ICD-10-CM

## 2024-07-28 DIAGNOSIS — N5201 Erectile dysfunction due to arterial insufficiency: Secondary | ICD-10-CM

## 2024-07-28 DIAGNOSIS — M6281 Muscle weakness (generalized): Secondary | ICD-10-CM | POA: Diagnosis not present

## 2024-07-28 LAB — URINALYSIS, ROUTINE W REFLEX MICROSCOPIC
Bilirubin, UA: NEGATIVE
Glucose, UA: NEGATIVE
Ketones, UA: NEGATIVE
Leukocytes,UA: NEGATIVE
Nitrite, UA: NEGATIVE
Protein,UA: NEGATIVE
RBC, UA: NEGATIVE
Specific Gravity, UA: 1.015 (ref 1.005–1.030)
Urobilinogen, Ur: 0.2 mg/dL (ref 0.2–1.0)
pH, UA: 6 (ref 5.0–7.5)

## 2024-07-28 MED ORDER — SILDENAFIL CITRATE 20 MG PO TABS: ORAL_TABLET | ORAL | 11 refills | Status: AC

## 2024-07-28 NOTE — Therapy (Signed)
 OUTPATIENT PHYSICAL THERAPY LYMPHEDEMA Treatment Progress Note Reporting Period 07/11/24 to 07/28/24  See note below for Objective Data and Assessment of Progress/Goals.      Patient Name: Devin [name] MRN: [id number] DOB:1946/05/18, 78 y.o., male Today's Date: 07/28/2024   END OF SESSION:  PT End of Session - 07/28/24 1427     Visit Number 18    Number of Visits 28    Date for Recertification  08/20/24    Authorization Type medicare    Progress Note Due on Visit 28   PN complete visit #10   PT Start Time 1305    PT Stop Time 1400    PT Time Calculation (min) 55 min    Activity Tolerance Patient tolerated treatment well    Behavior During Therapy [facility] for tasks assessed/performed                  Past Medical History:  Diagnosis Date   Arthritis    Asbestosis (HCC)    CAD (coronary artery disease)    Multivessel status post CABG March 2016 - LIMA to LAD, SVG to OM1, SVG to OM2, and SVG to PDA.   Essential hypertension    GERD (gastroesophageal reflux disease)    Melanoma of back (HCC)    Prostatic hypertrophy    Sleep apnea    Intolerant of CPAP   Past Surgical History:  Procedure Laterality Date   ANTERIOR CERVICAL DECOMP/DISCECTOMY FUSION     CARDIOVERSION N/A 01/28/2015   Procedure: CARDIOVERSION;  Surgeon: Dorn JULIANNA Ross, MD;  Location: [facility];  Service: Endoscopy;  Laterality: N/A;   CHOLECYSTECTOMY N/A 04/07/2020   Procedure: LAPAROSCOPIC CHOLECYSTECTOMY;  Surgeon: Kallie Manuelita BROCKS, MD;  Location: [facility];  Service: General;  Laterality: N/A;   CORONARY ARTERY BYPASS GRAFT N/A 12/29/2014   Procedure: CORONARY ARTERY BYPASS GRAFTING (CABG), ON PUMP, TIMES FOUR, USING LEFT INTERNAL MAMMARY ARTERY, RIGHT GREATER SAPHENOUS VEIN HARVESTED ENDOSCOPICALLY;  Surgeon: Maude Fleeta Ochoa, MD;  Location: [facility] OR;  Service: Open Heart Surgery;  Laterality: N/A;   KNEE ARTHROSCOPY Right    KNEE CARTILAGE SURGERY Left    LEFT HEART CATHETERIZATION WITH CORONARY  ANGIOGRAM N/A 12/28/2014   Procedure: LEFT HEART CATHETERIZATION WITH CORONARY ANGIOGRAM;  Surgeon: Dorn JINNY Lesches, MD;  Location: [facility] CATH LAB;  Service: Cardiovascular;  Laterality: N/A;   MELANOMA EXCISION     back   SHOULDER OPEN ROTATOR CUFF REPAIR Left    TEE WITHOUT CARDIOVERSION N/A 12/29/2014   Procedure: TRANSESOPHAGEAL ECHOCARDIOGRAM (TEE);  Surgeon: Maude Fleeta Ochoa, MD;  Location: [facility] OR;  Service: Open Heart Surgery;  Laterality: N/A;   TEE WITHOUT CARDIOVERSION N/A 01/28/2015   Procedure: TRANSESOPHAGEAL ECHOCARDIOGRAM (TEE);  Surgeon: Dorn JULIANNA Ross, MD;  Location: [facility];  Service: Endoscopy;  Laterality: N/A;   Patient Active Problem List   Diagnosis Date Noted   Cough variant asthma vs UACS  02/17/2022   DOE (dyspnea on exertion) 12/07/2020   Calculus of gallbladder with acute cholecystitis without obstruction 04/06/2020   Hypogonadism in male 10/17/2019   Nocturia 10/17/2019   Erectile dysfunction due to arterial insufficiency 10/17/2019   Atrial fibrillation and flutter (HCC) 01/23/2015   Palpitations 01/23/2015   Hyperlipidemia 01/23/2015   BPH with urinary obstruction 01/23/2015   Chronic diastolic CHF (congestive heart failure) /HFpEF 01/23/2015   Atrial fibrillation with RVR (HCC) 01/23/2015   Obstructive sleep apnea--- refuses CPAP 01/18/2015   Essential hypertension 01/18/2015   Leg edema 01/18/2015   S/P CABG x  4 12/29/2014   CAD (coronary artery disease), native coronary artery/H/o CABG x 4 12/26/2014    PCP: Sheryle Carwin, MD  REFERRING PROVIDER: Sheryle Carwin, MD  REFERRING DIAG:  Free Text Diagnosis  right leg cellulitis    THERAPY DIAG:  Edema most likely lymphedema causing cellulitis in both LE   Rationale for Evaluation and Treatment: Rehabilitation  ONSET DATE: chronic at least  since 2016  SUBJECTIVE:                                                                                                                                                                                            SUBJECTIVE STATEMENT:  Pt states he used the pump yesterday and didn't think it felt that good.  States his legs are too small after using it and taking off his bandages.  Pt reports he's going to order knee highs as the thigh highs were not comfortable.  Has ordered the butler for donning his compression but has not received it yet.     PERTINENT HISTORY: leg edema, CAD,  PAIN:  Are you having pain? Yes NPRS scale: 0-3/10  Pain location: knees  Pain orientation: Left  PAIN TYPE: itching and tightness  Pain description: intermittent  Aggravating factors: activity Relieving factors: mornings   PRECAUTIONS: Other: cellulitis   RED FLAGS: None   WEIGHT BEARING RESTRICTIONS: No  FALLS:  Has patient fallen in last 6 months? No  LIVING ENVIRONMENT: Lives with: lives with their spouse Lives in: House/apartment OCCUPATION: retired   LEISURE: walking   PRIOR LEVEL OF FUNCTION: Independent  PATIENT GOALS: less swelling no infection    OBJECTIVE: Note: Objective measures were completed at Evaluation unless otherwise noted.  COGNITION: Overall cognitive status: Within functional limits for tasks assessed   PALPATION: Noted induration   OBSERVATIONS / OTHER ASSESSMENTS: noted erythema ,positive stemmer sign   LYMPHEDEMA ASSESSMENTS:    LE LANDMARK RIGHT eval 06/25/24 07/02/24 07/09/24 07/16/24 07/23/24 07/28/24  At groin         30 cm proximal to suprapatella         20 cm proximal to suprapatella 67 65.3 65.3  65.7  66.5 65   10 cm proximal to suprapatella 57.8 57 55 55.7 54.8 53.5   At midpatella / popliteal crease 49.3 50.3 49 49 50.5 50.5   30 cm proximal to floor at lateral plantar foot 55 54.5 48 47.5 48.3 49   20 cm proximal to floor at lateral plantar foot 47.3 47.3 37.5 37 36.8 37   10 cm proximal to floor at lateral plantar foot 36.8 35.8 30.8 33 32.5 31  Circumference of ankle/heel 40.9 40.3 39.3 39.3 39.8 38   5  cm proximal to 1st MTP joint 29.7 30 30.5 28.4 29.7 29   Across MTP joint 28.9 28.8 29 28  28.2 27.3   Around proximal great toe 11 11 10.5 10.1 10.5 9.3   (Blank rows = not tested)  Leg length:  42cm, foot length 29cm  LE LANDMARK LEFT eval 06/25/24 07/02/24 07/09/24 07/16/24 07/23/24 07/28/24  At groin         30 cm proximal to suprapatella         20 cm proximal to suprapatella 64.8 64.8 62.8 63.5 65 62   10 cm proximal to suprapatella 58.7 58.7 56.5 56.7 57.8 55   At midpatella / popliteal crease 52.4 48.7 49.8 49 51.5 48.5   30 cm proximal to floor at lateral plantar foot 53.6 53.5 46.5 46.8 47.2 46.5   20 cm proximal to floor at lateral plantar foot 46.7 47 38 35 36 37   10 cm proximal to floor at lateral plantar foot 38.4 36.7 32 32.5 32.4 29   Circumference of ankle/heel 40.5 36.4 38.2 38.8 38.3 38.3   5 cm proximal to 1st MTP joint 29.4 31.5 31 29.4 29.4 29.4   Across MTP joint 28.6 29.5 29.5 28.6 29.7 27   Around proximal great toe 11.6 11.8 11.5 11.5 11.3 10.3   (Blank rows = not tested) leg length 44 cm   FUNCTIONAL TESTS:  30 seconds chair stand test:  9 average for age is 11-15 2 minute walk test: 348 ft   TODAY'S TREATMENT:                                                                                                                              DATE:  07/28/24: Measurements Manual decongestive techniques to include: supraclavicular, short and deep abdominal, inguinal/axillary anastomosis and B LE.  Posterior not completed today due to time    Application of  benadryl only today per pt request, no lotion, soft netting , multilayer short stretch bandages with 1/2in foam B foot to knee.  07/23/24: Measurements Manual decongestive techniques to include: supraclavicular, short and deep abdominal, inguinal/axillary anastomosis and B LE.  Posterior not completed today due to time    Application of  lotion, soft netting , multilayer short stretch bandages with 1/2in foam B foot to  knee.  07/21/24: Manual decongestive techniques to include: supraclavicular, short and deep abdominal, inguinal/axillary anastomosis and B LE.  Posterior completed in side lying position.    Application of  lotion prior multilayer short stretch bandages with 1/2in foam B foot to knee.  07/18/24: Manual decongestive techniques to include: supraclavicular, short and deep abdominal, inguinal/axillary anastomosis and B LE.  Posterior completed in side lying position.    Application of  lotion and Benadryl prior multilayer short stretch bandages with 1/2in foam B foot to knee.  07/16/24: Manual decongestive techniques to include: supraclavicular, short and deep  abdominal, inguinal/axillary anastomosis and B LE.  Posterior completed in side lying position.    Application of  lotion and Benadryl prior multilayer short stretch bandages with 1/2in foam B foot to knee.   Education details: 07/14/24:  Explained that if pt goes even an hour without compression, edema will begin to build up.  Therapist demonstrated donning compression garment with and without a butler.  Told pt where they could buy a butler from if they so desired as pt and wife appeared to be interested in the butler.  07/02/24:  compression bandaging using 1/2 foam and multiple short stretch bandages.    06/18/24:  self manual techniques  06/16/24 : HEP, what lymphedema is and how it is controlled,(four aspects to treatment) Person educated: Patient Education method: Explanation, Demonstration, Verbal cues, and Handouts Education comprehension: verbalized understanding  HOME EXERCISE PROGRAM: Access Code: [email or web address] URL: [email or web address] Date: 06/16/2024 Prepared by: Montie Metro  Exercises - Seated Diaphragmatic Breathing  - 1 x daily - 7 x weekly - 10 reps - 5 hold - Seated Cervical Sidebending AROM  - 1 x daily - 7 x weekly - 1 sets - 10 reps - 2-3 hold - Seated Cervical Rotation AROM  - 1 x daily - 7 x weekly  - 1 sets - 10 reps - 2-3 hold - Seated Cervical Extension AROM  - 1 x daily - 7 x weekly - 1 sets - 10 reps - 2-3 hold - Seated Cervical Retraction  - 1 x daily - 7 x weekly - 1 sets - 10 reps - 2-3 hold - Shoulder Rolls in Sitting  - 1 x daily - 7 x weekly - 1 sets - 10 reps - 2-3 hold - Seated Sidebending Arms Overhead  - 1 x daily - 7 x weekly - 1 sets - 10 reps - 2-3 hold - Seated Hip Abduction  - 1 x daily - 7 x weekly - 1 sets - 10 reps - 2-3 hold - Seated Long Arc Quad  - 1 x daily - 7 x weekly - 1 sets - 10 reps - 2-3 hold - Seated Heel Toe Raises  - 1 x daily - 7 x weekly - 1 sets - 10 reps - 2-3 hold - Seated Toe Curl  - 1 x daily - 7 x weekly - 1 sets - 10 reps - 2-3 hold  ASSESSMENT:  CLINICAL IMPRESSION:   Pump used and reports was uncomfortable.  Instructed pt to call rep for possible adjustment as it may be set too high and should be comfortable.   Pt states he feels he is ready as he feels his legs are smaller now but would like to wait on donner and ensure increased ease to putting them on/taking off.  Also express interest in using knee highs only at this time.  Manual decongestive techniques complete anterior only today due to measurement and education. Induration continues to be present, however pt reports none present when removes bandages. Measurements today are mostly unchanged as compared to last week with reduction noted at 20cm proximal to suprapatella bilaterally.   Discussed bringing in sock donner to ensure he is able to don using this.  Will continue total decongestive techniques to further reduce indurated areas.      OBJECTIVE IMPAIRMENTS: difficulty walking, increased edema, pain, and decreased skin integrity .   ACTIVITY LIMITATIONS: dressing, hygiene/grooming, and locomotion level  REHAB POTENTIAL: Good  CLINICAL DECISION MAKING: Evolving/moderate complexity  EVALUATION COMPLEXITY: Moderate  GOALS: Goals  reviewed with patient? No  SHORT TERM  GOALS: Target date: 07/07/24  Pt to be I in HEP to improve lymphatic circulation  Baseline: Goal status: met   2.  Pt to be completing daily skin care to assist in preventing cellulitis  Baseline:  Goal status: met  3.  Pt to have lost 3 cm from Lt LE measurements Baseline:  Goal status: met   LONG TERM GOALS: Target date: 07/28/24  PT to be I in self manual techniques  Baseline:  Goal status: met  2.  Pt to understand that he should be wearing compression wear daily Baseline:  Goal status: met   3.  PT to have lost 4-5 cm from Lt LE daily to assist in preventing cellulitis  Baseline:  Goal status: on-going 4.  PT to have and be using a compression pump to assist in decreasing fluid in his LE                     Goal Status:  on going   PLAN:  PT FREQUENCY: 3x/week  PT DURATION: 4 weeks  PLANNED INTERVENTIONS: 97110-Therapeutic exercises, 97535- Self Care, and 02859- Manual therapy  PLAN FOR NEXT SESSION:  Continue total decongestive techniques.  Measure Wednesdays. (Spoke to [place] regarding scheduling as is only scheduled 2X week for next several weeks and needs MWF).    Greig KATHEE Fuse, PTA/CLT [facility] Ph: [phone]  07/28/2024, 4:32 PM

## 2024-07-28 NOTE — Progress Notes (Signed)
 07/28/2024 10:40 AM   Devin [name] 1946/02/22 [id number]  Referring provider: Sheryle Carwin, MD [address] [place],  [place] [address]  No chief complaint on file.   HPI:  New pt for me -   1) BPH-patient with frequency.  On doxazosin  4 mg twice daily.  IPSS 8.  PSA August 2024 was 1.7.  Prostate was 49 g on imaging with no middle lobe.  2) low testosterone -on T replacement 400 mg IM every 2 weeks since 40+ yrs. Patient has had polycythemia. Taking 1/2 ml weekly.   3) ED-progressive ED.  Tried the VED.  Tried Gummies off the Internet which helped.  Discussed IPP.  Given a prescription for tadalafil . He takes 20 mg hours before sex.   Today, seen for the above. Sep 2025 Hct 50.4, T383, PSA 2.1. IPSS 12. He asked about cutting back doxazosin . He stopped doxazosin . He tried 8 mg once but felt bad. BP was 109/ . Also discussed const rings and vibration.    PMH: Past Medical History:  Diagnosis Date   Arthritis    Asbestosis (HCC)    CAD (coronary artery disease)    Multivessel status post CABG March 2016 - LIMA to LAD, SVG to OM1, SVG to OM2, and SVG to PDA.   Essential hypertension    GERD (gastroesophageal reflux disease)    Melanoma of back (HCC)    Prostatic hypertrophy    Sleep apnea    Intolerant of CPAP    Surgical History: Past Surgical History:  Procedure Laterality Date   ANTERIOR CERVICAL DECOMP/DISCECTOMY FUSION     CARDIOVERSION N/A 01/28/2015   Procedure: CARDIOVERSION;  Surgeon: Dorn JULIANNA Ross, MD;  Location: [facility];  Service: Endoscopy;  Laterality: N/A;   CHOLECYSTECTOMY N/A 04/07/2020   Procedure: LAPAROSCOPIC CHOLECYSTECTOMY;  Surgeon: Kallie Manuelita BROCKS, MD;  Location: [facility];  Service: General;  Laterality: N/A;   CORONARY ARTERY BYPASS GRAFT N/A 12/29/2014   Procedure: CORONARY ARTERY BYPASS GRAFTING (CABG), ON PUMP, TIMES FOUR, USING LEFT INTERNAL MAMMARY ARTERY, RIGHT GREATER SAPHENOUS VEIN HARVESTED ENDOSCOPICALLY;  Surgeon: Maude Fleeta Ochoa, MD;  Location: [facility] OR;  Service: Open Heart Surgery;  Laterality: N/A;   KNEE ARTHROSCOPY Right    KNEE CARTILAGE SURGERY Left    LEFT HEART CATHETERIZATION WITH CORONARY ANGIOGRAM N/A 12/28/2014   Procedure: LEFT HEART CATHETERIZATION WITH CORONARY ANGIOGRAM;  Surgeon: Dorn JINNY Lesches, MD;  Location[facility] CATH LAB;  Service: Cardiovascular;  Laterality: N/A;   MELANOMA EXCISION     back   SHOULDER OPEN ROTATOR CUFF REPAIR Left    TEE WITHOUT CARDIOVERSION N/A 12/29/2014   Procedure: TRANSESOPHAGEAL ECHOCARDIOGRAM (TEE);  Surgeon: Maude Fleeta Ochoa, MD;  Location: [facility] OR;  Service: Open Heart Surgery;  Laterality: N/A;   TEE WITHOUT CARDIOVERSION N/A 01/28/2015   Procedure: TRANSESOPHAGEAL ECHOCARDIOGRAM (TEE);  Surgeon: Dorn JULIANNA Ross, MD;  Location: [facility];  Service: Endoscopy;  Laterality: N/A;    Home Medications:  Allergies as of 07/28/2024       Reactions   Lasix  [furosemide ] Nausea Only        Medication List        Accurate as of July 28, 2024 10:40 AM. If you have any questions, ask your nurse or doctor.          albuterol  108 (90 Base) MCG/ACT inhaler Commonly known as: VENTOLIN  HFA Inhale into the lungs.   aspirin  81 MG tablet Take 1 tablet (81 mg total) by mouth daily with breakfast.  For Heart   atorvastatin  10 MG tablet Commonly known as: LIPITOR  Take 10 mg by mouth daily.   clonazePAM  0.5 MG tablet Commonly known as: KLONOPIN  0.5 mg at bedtime.   cyanocobalamin  1000 MCG/ML injection Commonly known as: VITAMIN B12 INJECT 1ML AS DIRECTED EVERY 4 WEEKS   doxazosin  4 MG tablet Commonly known as: CARDURA  TAKE 1 TABLET(4 MG) BY MOUTH IN THE MORNING AND AT BEDTIME   metoprolol  succinate 25 MG 24 hr tablet Commonly known as: Toprol  XL Take 1 tablet (25 mg total) by mouth daily. For Heart   neomycin -polymyxin-hydrocortisone 3.5-10000-1 OTIC suspension Commonly known as: CORTISPORIN Apply 1-2 drops daily after soaking and cover with bandaid    tadalafil  20 MG tablet Commonly known as: CIALIS  Take 1 tablet (20 mg total) by mouth daily as needed for erectile dysfunction.   testosterone  cypionate 200 MG/ML injection Commonly known as: DEPOTESTOSTERONE CYPIONATE ADMINISTER 2 ML(400 MG) IN THE MUSCLE EVERY 14 DAYS   torsemide  20 MG tablet Commonly known as: DEMADEX  Take 2 tablets (40 mg total) by mouth daily. What changed:  how much to take when to take this        Allergies:  Allergies  Allergen Reactions   Lasix  [Furosemide ] Nausea Only    Family History: Family History  Problem Relation Age of Onset   Stroke Mother    Hypertension Mother    Heart disease Sister        Died from complications of RHD   Heart disease Brother        Heart failure related to EtOH and smoking   Stomach cancer Sister     Social History:  reports that he has never smoked. He has never used smokeless tobacco. He reports current alcohol use. He reports that he does not use drugs.   Physical Exam: BP 136/67   Pulse 76   Constitutional:  Alert and oriented, No acute distress. HEENT: [place] AT, moist mucus membranes.  Trachea midline, no masses. Cardiovascular: No clubbing, cyanosis, or edema. Respiratory: Normal respiratory effort, no increased work of breathing. GI: Abdomen is soft, nontender, nondistended, no abdominal masses GU: No CVA tenderness Skin: No rashes, bruises or suspicious lesions. Neurologic: Grossly intact, no focal deficits, moving all 4 extremities. Psychiatric: Normal mood and affect.  Laboratory Data: Lab Results  Component Value Date   WBC 4.6 10/24/2022   HGB 16.9 07/21/2024   HCT 50.4 07/21/2024   MCV 99.8 10/24/2022   PLT 173 10/24/2022    Lab Results  Component Value Date   CREATININE 1.36 (H) 03/18/2024    No results found for: PSA  Lab Results  Component Value Date   TESTOSTERONE  383 07/21/2024    Lab Results  Component Value Date   HGBA1C 5.6 01/23/2015    Urinalysis     Component Value Date/Time   COLORURINE YELLOW 01/08/2015 1840   APPEARANCEUR Clear 01/24/2024 1130   LABSPEC 1.025 01/08/2015 1840   PHURINE 5.5 01/08/2015 1840   GLUCOSEU Negative 01/24/2024 1130   HGBUR NEGATIVE 01/08/2015 1840   BILIRUBINUR Negative 01/24/2024 1130   KETONESUR NEGATIVE 01/08/2015 1840   PROTEINUR Negative 01/24/2024 1130   PROTEINUR NEGATIVE 01/08/2015 1840   UROBILINOGEN 0.2 01/08/2015 1840   NITRITE Negative 01/24/2024 1130   NITRITE NEGATIVE 01/08/2015 1840   LEUKOCYTESUR Negative 01/24/2024 1130    Lab Results  Component Value Date   LABMICR Comment 01/24/2024   WBCUA 0-5 01/04/2023   LABEPIT 0-10 01/04/2023   BACTERIA None seen 01/04/2023  Pertinent Imaging: N/a   Assessment & Plan:    1. BPH with urinary obstruction (Primary) Again discussed the nature r/b/a to alpha blockers and what to look out for if he needs to restart. OK to remain off from GU pt of view.  - Urinalysis, Routine w reflex microscopic  2. low T - stable. OK to do 1/2 ml weekly.   3.  ED - stable . He can boost it with sildenafil but a low dose.   No follow-ups on file.  Donnice Brooks, MD  [facility]  [address] [place], [place] [address] [phone]

## 2024-07-29 NOTE — Addendum Note (Signed)
 Addended by: NELWYN MONTIE PARAS on: 07/29/2024 11:55 AM   Modules accepted: Orders

## 2024-07-30 ENCOUNTER — Ambulatory Visit (HOSPITAL_COMMUNITY): Attending: Internal Medicine | Admitting: Physical Therapy

## 2024-07-30 DIAGNOSIS — I890 Lymphedema, not elsewhere classified: Secondary | ICD-10-CM | POA: Insufficient documentation

## 2024-07-30 DIAGNOSIS — R600 Localized edema: Secondary | ICD-10-CM | POA: Diagnosis not present

## 2024-07-30 DIAGNOSIS — M6281 Muscle weakness (generalized): Secondary | ICD-10-CM | POA: Diagnosis not present

## 2024-07-30 DIAGNOSIS — R262 Difficulty in walking, not elsewhere classified: Secondary | ICD-10-CM | POA: Insufficient documentation

## 2024-07-30 DIAGNOSIS — M79672 Pain in left foot: Secondary | ICD-10-CM | POA: Diagnosis not present

## 2024-07-30 NOTE — Therapy (Signed)
 OUTPATIENT PHYSICAL THERAPY LYMPHEDEMA Treatment    Patient Name: Devin [name] MRN: [id number] DOB:1946-01-18, 78 y.o., male Today's Date: 07/30/2024   END OF SESSION:  PT End of Session - 07/30/24 1115     Visit Number 19    Number of Visits 28    Date for Recertification  08/20/24    Authorization Type medicare    Progress Note Due on Visit 28   PN complete visit #10   PT Start Time 1020    PT Stop Time 1114    PT Time Calculation (min) 54 min    Activity Tolerance Patient tolerated treatment well    Behavior During Therapy [facility] for tasks assessed/performed                   Past Medical History:  Diagnosis Date   Arthritis    Asbestosis (HCC)    CAD (coronary artery disease)    Multivessel status post CABG March 2016 - LIMA to LAD, SVG to OM1, SVG to OM2, and SVG to PDA.   Essential hypertension    GERD (gastroesophageal reflux disease)    Melanoma of back (HCC)    Prostatic hypertrophy    Sleep apnea    Intolerant of CPAP   Past Surgical History:  Procedure Laterality Date   ANTERIOR CERVICAL DECOMP/DISCECTOMY FUSION     CARDIOVERSION N/A 01/28/2015   Procedure: CARDIOVERSION;  Surgeon: Dorn JULIANNA Ross, MD;  Location: [facility] ORS;  Service: Endoscopy;  Laterality: N/A;   CHOLECYSTECTOMY N/A 04/07/2020   Procedure: LAPAROSCOPIC CHOLECYSTECTOMY;  Surgeon: Kallie Manuelita BROCKS, MD;  Location: [facility];  Service: General;  Laterality: N/A;   CORONARY ARTERY BYPASS GRAFT N/A 12/29/2014   Procedure: CORONARY ARTERY BYPASS GRAFTING (CABG), ON PUMP, TIMES FOUR, USING LEFT INTERNAL MAMMARY ARTERY, RIGHT GREATER SAPHENOUS VEIN HARVESTED ENDOSCOPICALLY;  Surgeon: Maude Fleeta Ochoa, MD;  Location: [facility] OR;  Service: Open Heart Surgery;  Laterality: N/A;   KNEE ARTHROSCOPY Right    KNEE CARTILAGE SURGERY Left    LEFT HEART CATHETERIZATION WITH CORONARY ANGIOGRAM N/A 12/28/2014   Procedure: LEFT HEART CATHETERIZATION WITH CORONARY ANGIOGRAM;  Surgeon: Dorn JINNY Lesches, MD;   Location[facility] CATH LAB;  Service: Cardiovascular;  Laterality: N/A;   MELANOMA EXCISION     back   SHOULDER OPEN ROTATOR CUFF REPAIR Left    TEE WITHOUT CARDIOVERSION N/A 12/29/2014   Procedure: TRANSESOPHAGEAL ECHOCARDIOGRAM (TEE);  Surgeon: Maude Fleeta Ochoa, MD;  Location: [facility] OR;  Service: Open Heart Surgery;  Laterality: N/A;   TEE WITHOUT CARDIOVERSION N/A 01/28/2015   Procedure: TRANSESOPHAGEAL ECHOCARDIOGRAM (TEE);  Surgeon: Dorn JULIANNA Ross, MD;  Location: [facility];  Service: Endoscopy;  Laterality: N/A;   Patient Active Problem List   Diagnosis Date Noted   Cough variant asthma vs UACS  02/17/2022   DOE (dyspnea on exertion) 12/07/2020   Calculus of gallbladder with acute cholecystitis without obstruction 04/06/2020   Hypogonadism in male 10/17/2019   Nocturia 10/17/2019   Erectile dysfunction due to arterial insufficiency 10/17/2019   Atrial fibrillation and flutter (HCC) 01/23/2015   Palpitations 01/23/2015   Hyperlipidemia 01/23/2015   BPH with urinary obstruction 01/23/2015   Chronic diastolic CHF (congestive heart failure) /HFpEF 01/23/2015   Atrial fibrillation with RVR (HCC) 01/23/2015   Obstructive sleep apnea--- refuses CPAP 01/18/2015   Essential hypertension 01/18/2015   Leg edema 01/18/2015   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   CAD (coronary artery disease), native coronary artery/H/o CABG x 4 12/26/2014    PCP:  Sheryle Carwin, MD  REFERRING PROVIDER: Sheryle Carwin, MD  REFERRING DIAG:  Free Text Diagnosis  right leg cellulitis    THERAPY DIAG:  Edema most likely lymphedema causing cellulitis in both LE   Rationale for Evaluation and Treatment: Rehabilitation  ONSET DATE: chronic at least  since 2016  SUBJECTIVE:                                                                                                                                                                                           SUBJECTIVE STATEMENT:  Pt states he has not heard from [employer] [employer]  but is going to call them.   PERTINENT HISTORY: leg edema, CAD,  PAIN:  Are you having pain? Yes NPRS scale: 0-3/10  Pain location: knees  Pain orientation: Left  PAIN TYPE: itching and tightness  Pain description: intermittent  Aggravating factors: activity Relieving factors: mornings   PRECAUTIONS: Other: cellulitis   RED FLAGS: None   WEIGHT BEARING RESTRICTIONS: No  FALLS:  Has patient fallen in last 6 months? No  LIVING ENVIRONMENT: Lives with: lives with their spouse Lives in: House/apartment OCCUPATION: retired   LEISURE: walking   PRIOR LEVEL OF FUNCTION: Independent  PATIENT GOALS: less swelling no infection    OBJECTIVE: Note: Objective measures were completed at Evaluation unless otherwise noted.  COGNITION: Overall cognitive status: Within functional limits for tasks assessed   PALPATION: Noted induration   OBSERVATIONS / OTHER ASSESSMENTS: noted erythema ,positive stemmer sign   LYMPHEDEMA ASSESSMENTS:    LE LANDMARK RIGHT eval 06/25/24 07/02/24 07/09/24 07/16/24 07/23/24 07/30/24  At groin         30 cm proximal to suprapatella         20 cm proximal to suprapatella 67 65.3 65.3  65.7  66.5 65 63  10 cm proximal to suprapatella 57.8 57 55 55.7 54.8 53.5 53.5  At midpatella / popliteal crease 49.3 50.3 49 49 50.5 50.5 48.4  30 cm proximal to floor at lateral plantar foot 55 54.5 48 47.5 48.3 49 47.7  20 cm proximal to floor at lateral plantar foot 47.3 47.3 37.5 37 36.8 37 37.6  10 cm proximal to floor at lateral plantar foot 36.8 35.8 30.8 33 32.5 31 30   Circumference of ankle/heel 40.9 40.3 39.3 39.3 39.8 38 38.6  5 cm proximal to 1st MTP joint 29.7 30 30.5 28.4 29.7 29 28.4  Across MTP joint 28.9 28.8 29 28  28.2 27.3 27.3  Around proximal great toe 11 11 10.5 10.1 10.5 9.3 9.7  (Blank rows = not tested)  Leg length:  42cm, foot length 29cm  LE LANDMARK  LEFT eval 06/25/24 07/02/24 07/09/24 07/16/24 07/23/24 07/30/24  At groin         30 cm  proximal to suprapatella         20 cm proximal to suprapatella 64.8 64.8 62.8 63.5 65 62 63  10 cm proximal to suprapatella 58.7 58.7 56.5 56.7 57.8 55 56.3  At midpatella / popliteal crease 52.4 48.7 49.8 49 51.5 48.5 49.7  30 cm proximal to floor at lateral plantar foot 53.6 53.5 46.5 46.8 47.2 46.5 46.8  20 cm proximal to floor at lateral plantar foot 46.7 47 38 35 36 37 37.3  10 cm proximal to floor at lateral plantar foot 38.4 36.7 32 32.5 32.4 29 30.3  Circumference of ankle/heel 40.5 36.4 38.2 38.8 38.3 38.3 37.8  5 cm proximal to 1st MTP joint 29.4 31.5 31 29.4 29.4 29.4 28.3  Across MTP joint 28.6 29.5 29.5 28.6 29.7 27 27.2  Around proximal great toe 11.6 11.8 11.5 11.5 11.3 10.3 10.6  (Blank rows = not tested) leg length 44 cm   FUNCTIONAL TESTS:  30 seconds chair stand test:  9 average for age is 11-15 2 minute walk test: 348 ft   TODAY'S TREATMENT:                                                                                                                              DATE:   07/30/24: Measurements Manual decongestive techniques to include: supraclavicular, short and deep abdominal, inguinal/axillary anastomosis and B LE.  Posterior completed side lying    Application of  lotion, soft netting , multilayer short stretch bandages with 1/2in foam B foot to knee.   Education details: 07/14/24:  Explained that if pt goes even an hour without compression, edema will begin to build up.  Therapist demonstrated donning compression garment with and without a butler.  Told pt where they could buy a butler from if they so desired as pt and wife appeared to be interested in the butler.  07/02/24:  compression bandaging using 1/2 foam and multiple short stretch bandages.    06/18/24:  self manual techniques  06/16/24 : HEP, what lymphedema is and how it is controlled,(four aspects to treatment) Person educated: Patient Education method: Explanation, Demonstration, Verbal cues, and  Handouts Education comprehension: verbalized understanding  HOME EXERCISE PROGRAM: Access Code: [email or web address] URL: [email or web address] Date: 06/16/2024 Prepared by: Montie Metro  Exercises - Seated Diaphragmatic Breathing  - 1 x daily - 7 x weekly - 10 reps - 5 hold - Seated Cervical Sidebending AROM  - 1 x daily - 7 x weekly - 1 sets - 10 reps - 2-3 hold - Seated Cervical Rotation AROM  - 1 x daily - 7 x weekly - 1 sets - 10 reps - 2-3 hold - Seated Cervical Extension AROM  - 1 x daily - 7 x weekly - 1 sets - 10 reps - 2-3 hold - Seated Cervical Retraction  -  1 x daily - 7 x weekly - 1 sets - 10 reps - 2-3 hold - Shoulder Rolls in Sitting  - 1 x daily - 7 x weekly - 1 sets - 10 reps - 2-3 hold - Seated Sidebending Arms Overhead  - 1 x daily - 7 x weekly - 1 sets - 10 reps - 2-3 hold - Seated Hip Abduction  - 1 x daily - 7 x weekly - 1 sets - 10 reps - 2-3 hold - Seated Long Arc Quad  - 1 x daily - 7 x weekly - 1 sets - 10 reps - 2-3 hold - Seated Heel Toe Raises  - 1 x daily - 7 x weekly - 1 sets - 10 reps - 2-3 hold - Seated Toe Curl  - 1 x daily - 7 x weekly - 1 sets - 10 reps - 2-3 hold  ASSESSMENT:  CLINICAL IMPRESSION:   Pt measured with noted reduction on Rt LE, however, Lt LE showed slight increases. Pt is using his compression garment following the pump but is having a difficult time donning and put a hole through one.  Therapist explained that the compression is not correct once the garment has a hole in it and pt should discard.  Pt has not received his butler or heard from [facility] at this point.  Therapist gave pt [facility] [employer] card to call and see where they are in fulfilling his order. .  Will continue total decongestive techniques to further reduce indurated areas.      OBJECTIVE IMPAIRMENTS: difficulty walking, increased edema, pain, and decreased skin integrity .   ACTIVITY LIMITATIONS: dressing, hygiene/grooming, and locomotion  level  REHAB POTENTIAL: Good  CLINICAL DECISION MAKING: Evolving/moderate complexity  EVALUATION COMPLEXITY: Moderate  GOALS: Goals reviewed with patient? No  SHORT TERM GOALS: Target date: 07/07/24  Pt to be I in HEP to improve lymphatic circulation  Baseline: Goal status: met   2.  Pt to be completing daily skin care to assist in preventing cellulitis  Baseline:  Goal status: met  3.  Pt to have lost 3 cm from Lt LE measurements Baseline:  Goal status: met   LONG TERM GOALS: Target date: 07/28/24  PT to be I in self manual techniques  Baseline:  Goal status: met  2.  Pt to understand that he should be wearing compression wear daily Baseline:  Goal status: met   3.  PT to have lost 4-5 cm from Lt LE daily to assist in preventing cellulitis  Baseline:  Goal status: on-going 4.  PT to have and be using a compression pump to assist in decreasing fluid in his LE                     Goal Status:  on going   PLAN:  PT FREQUENCY: 3x/week  PT DURATION: 4 weeks  Continue for 3 more weeks to achieve all goals total of 6 weeks.   PLANNED INTERVENTIONS: 97110-Therapeutic exercises, 97535- Self Care, and 02859- Manual therapy  PLAN FOR NEXT SESSION:  Continue total decongestive techniques.  Measure Wednesdays.  Montie Metro, PT CLT [phone] [facility] Ph: [phone] Montie Metro, PT CLT [phone]  07/30/2024, 11:16 AM

## 2024-07-31 ENCOUNTER — Encounter (HOSPITAL_COMMUNITY): Admitting: Physical Therapy

## 2024-08-01 ENCOUNTER — Ambulatory Visit (HOSPITAL_COMMUNITY): Admitting: Physical Therapy

## 2024-08-01 DIAGNOSIS — I890 Lymphedema, not elsewhere classified: Secondary | ICD-10-CM

## 2024-08-01 DIAGNOSIS — R600 Localized edema: Secondary | ICD-10-CM

## 2024-08-01 DIAGNOSIS — M6281 Muscle weakness (generalized): Secondary | ICD-10-CM | POA: Diagnosis not present

## 2024-08-01 DIAGNOSIS — R262 Difficulty in walking, not elsewhere classified: Secondary | ICD-10-CM | POA: Diagnosis not present

## 2024-08-01 DIAGNOSIS — M79672 Pain in left foot: Secondary | ICD-10-CM | POA: Diagnosis not present

## 2024-08-01 NOTE — Therapy (Signed)
 OUTPATIENT PHYSICAL THERAPY LYMPHEDEMA Treatment/progress    Patient Name: Devin [name] MRN: [id number] DOB:06/08/1946, 78 y.o., male Today's Date: 08/01/2024 Progress Note Reporting Period 07/09/24 to 08/01/24  See note below for Objective Data and Assessment of Progress/Goals.      END OF SESSION:  PT End of Session - 08/01/24 1121     Visit Number 20    Number of Visits 28    Date for Recertification  08/20/24    Authorization Type medicare    Progress Note Due on Visit 28   PN complete visit #10   PT Start Time 1020    PT Stop Time 1116    PT Time Calculation (min) 56 min    Activity Tolerance Patient tolerated treatment well    Behavior During Therapy [facility] for tasks assessed/performed                   Past Medical History:  Diagnosis Date   Arthritis    Asbestosis (HCC)    CAD (coronary artery disease)    Multivessel status post CABG March 2016 - LIMA to LAD, SVG to OM1, SVG to OM2, and SVG to PDA.   Essential hypertension    GERD (gastroesophageal reflux disease)    Melanoma of back (HCC)    Prostatic hypertrophy    Sleep apnea    Intolerant of CPAP   Past Surgical History:  Procedure Laterality Date   ANTERIOR CERVICAL DECOMP/DISCECTOMY FUSION     CARDIOVERSION N/A 01/28/2015   Procedure: CARDIOVERSION;  Surgeon: Dorn JULIANNA Ross, MD;  Location: [facility];  Service: Endoscopy;  Laterality: N/A;   CHOLECYSTECTOMY N/A 04/07/2020   Procedure: LAPAROSCOPIC CHOLECYSTECTOMY;  Surgeon: Kallie Manuelita BROCKS, MD;  Location: [facility];  Service: General;  Laterality: N/A;   CORONARY ARTERY BYPASS GRAFT N/A 12/29/2014   Procedure: CORONARY ARTERY BYPASS GRAFTING (CABG), ON PUMP, TIMES FOUR, USING LEFT INTERNAL MAMMARY ARTERY, RIGHT GREATER SAPHENOUS VEIN HARVESTED ENDOSCOPICALLY;  Surgeon: Maude Fleeta Ochoa, MD;  Location: [facility] OR;  Service: Open Heart Surgery;  Laterality: N/A;   KNEE ARTHROSCOPY Right    KNEE CARTILAGE SURGERY Left    LEFT HEART CATHETERIZATION WITH  CORONARY ANGIOGRAM N/A 12/28/2014   Procedure: LEFT HEART CATHETERIZATION WITH CORONARY ANGIOGRAM;  Surgeon: Dorn JINNY Lesches, MD;  Location: [facility] CATH LAB;  Service: Cardiovascular;  Laterality: N/A;   MELANOMA EXCISION     back   SHOULDER OPEN ROTATOR CUFF REPAIR Left    TEE WITHOUT CARDIOVERSION N/A 12/29/2014   Procedure: TRANSESOPHAGEAL ECHOCARDIOGRAM (TEE);  Surgeon: Maude Fleeta Ochoa, MD;  Location: [facility] OR;  Service: Open Heart Surgery;  Laterality: N/A;   TEE WITHOUT CARDIOVERSION N/A 01/28/2015   Procedure: TRANSESOPHAGEAL ECHOCARDIOGRAM (TEE);  Surgeon: Dorn JULIANNA Ross, MD;  Location: [facility];  Service: Endoscopy;  Laterality: N/A;   Patient Active Problem List   Diagnosis Date Noted   Cough variant asthma vs UACS  02/17/2022   DOE (dyspnea on exertion) 12/07/2020   Calculus of gallbladder with acute cholecystitis without obstruction 04/06/2020   Hypogonadism in male 10/17/2019   Nocturia 10/17/2019   Erectile dysfunction due to arterial insufficiency 10/17/2019   Atrial fibrillation and flutter (HCC) 01/23/2015   Palpitations 01/23/2015   Hyperlipidemia 01/23/2015   BPH with urinary obstruction 01/23/2015   Chronic diastolic CHF (congestive heart failure) /HFpEF 01/23/2015   Atrial fibrillation with RVR (HCC) 01/23/2015   Obstructive sleep apnea--- refuses CPAP 01/18/2015   Essential hypertension 01/18/2015   Leg edema 01/18/2015   S/P  CABG x 4 12/29/2014   CAD (coronary artery disease), native coronary artery/H/o CABG x 4 12/26/2014    PCP: Sheryle Carwin, MD  REFERRING PROVIDER: Sheryle Carwin, MD  REFERRING DIAG:  Free Text Diagnosis  right leg cellulitis    THERAPY DIAG:  Edema most likely lymphedema causing cellulitis in both LE   Rationale for Evaluation and Treatment: Rehabilitation  ONSET DATE: chronic at least  since 2016  SUBJECTIVE:                                                                                                                                                                                            SUBJECTIVE STATEMENT:  Pt states he called his [facility] [employer] who is trying to find out what, if any, his insurance is going to cover prior to calling him about his order for juxtafit.    PERTINENT HISTORY: leg edema, CAD,  PAIN:  Are you having pain? No not in legs but in jt as pt has OA we will not address this.   NPRS scale: 0-/10    PRECAUTIONS: Other: cellulitis   RED FLAGS: None   WEIGHT BEARING RESTRICTIONS: No  FALLS:  Has patient fallen in last 6 months? No  LIVING ENVIRONMENT: Lives with: lives with their spouse Lives in: House/apartment OCCUPATION: retired   LEISURE: walking   PRIOR LEVEL OF FUNCTION: Independent  PATIENT GOALS: less swelling no infection    OBJECTIVE: Note: Objective measures were completed at Evaluation unless otherwise noted.  COGNITION: Overall cognitive status: Within functional limits for tasks assessed   PALPATION: Noted induration   OBSERVATIONS / OTHER ASSESSMENTS: noted erythema ,positive stemmer sign   LYMPHEDEMA ASSESSMENTS:    LE LANDMARK RIGHT eval 06/25/24 07/02/24 07/09/24 07/16/24 07/23/24 07/30/24  At groin         30 cm proximal to suprapatella         20 cm proximal to suprapatella 67 65.3 65.3  65.7  66.5 65 63  10 cm proximal to suprapatella 57.8 57 55 55.7 54.8 53.5 53.5  At midpatella / popliteal crease 49.3 50.3 49 49 50.5 50.5 48.4  30 cm proximal to floor at lateral plantar foot 55 54.5 48 47.5 48.3 49 47.7  20 cm proximal to floor at lateral plantar foot 47.3 47.3 37.5 37 36.8 37 37.6  10 cm proximal to floor at lateral plantar foot 36.8 35.8 30.8 33 32.5 31 30   Circumference of ankle/heel 40.9 40.3 39.3 39.3 39.8 38 38.6  5 cm proximal to 1st MTP joint 29.7 30 30.5 28.4 29.7 29 28.4  Across MTP joint 28.9 28.8 29 28  28.2  27.3 27.3  Around proximal great toe 11 11 10.5 10.1 10.5 9.3 9.7  (Blank rows = not tested)  Leg length:  42cm, foot length  29cm  LE LANDMARK LEFT eval 06/25/24 07/02/24 07/09/24 07/16/24 07/23/24 07/30/24  At groin         30 cm proximal to suprapatella         20 cm proximal to suprapatella 64.8 64.8 62.8 63.5 65 62 63  10 cm proximal to suprapatella 58.7 58.7 56.5 56.7 57.8 55 56.3  At midpatella / popliteal crease 52.4 48.7 49.8 49 51.5 48.5 49.7  30 cm proximal to floor at lateral plantar foot 53.6 53.5 46.5 46.8 47.2 46.5 46.8  20 cm proximal to floor at lateral plantar foot 46.7 47 38 35 36 37 37.3  10 cm proximal to floor at lateral plantar foot 38.4 36.7 32 32.5 32.4 29 30.3  Circumference of ankle/heel 40.5 36.4 38.2 38.8 38.3 38.3 37.8  5 cm proximal to 1st MTP joint 29.4 31.5 31 29.4 29.4 29.4 28.3  Across MTP joint 28.6 29.5 29.5 28.6 29.7 27 27.2  Around proximal great toe 11.6 11.8 11.5 11.5 11.3 10.3 10.6  (Blank rows = not tested) leg length 44 cm   FUNCTIONAL TESTS:  30 seconds chair stand test:  9 average for age is 11-15 2 minute walk test: 348 ft   TODAY'S TREATMENT:                                                                                                                              DATE:   08/01/24: Manual decongestive techniques to include: supraclavicular, short and deep abdominal, inguinal/axillary anastomosis and B LE.  Posterior completed side lying    Application of  lotion, soft netting , multilayer short stretch bandages with 1/2in foam B foot to knee.   Education details: 07/14/24:  Explained that if pt goes even an hour without compression, edema will begin to build up.  Therapist demonstrated donning compression garment with and without a butler.  Told pt where they could buy a butler from if they so desired as pt and wife appeared to be interested in the butler.  07/02/24:  compression bandaging using 1/2 foam and multiple short stretch bandages.    06/18/24:  self manual techniques  06/16/24 : HEP, what lymphedema is and how it is controlled,(four aspects to  treatment) Person educated: Patient Education method: Explanation, Demonstration, Verbal cues, and Handouts Education comprehension: verbalized understanding  HOME EXERCISE PROGRAM: Access Code: [email or web address] URL: [email or web address] Date: 06/16/2024 Prepared by: Montie Metro  Exercises - Seated Diaphragmatic Breathing  - 1 x daily - 7 x weekly - 10 reps - 5 hold - Seated Cervical Sidebending AROM  - 1 x daily - 7 x weekly - 1 sets - 10 reps - 2-3 hold - Seated Cervical Rotation AROM  - 1 x daily - 7 x weekly - 1 sets - 10 reps - 2-3  hold - Seated Cervical Extension AROM  - 1 x daily - 7 x weekly - 1 sets - 10 reps - 2-3 hold - Seated Cervical Retraction  - 1 x daily - 7 x weekly - 1 sets - 10 reps - 2-3 hold - Shoulder Rolls in Sitting  - 1 x daily - 7 x weekly - 1 sets - 10 reps - 2-3 hold - Seated Sidebending Arms Overhead  - 1 x daily - 7 x weekly - 1 sets - 10 reps - 2-3 hold - Seated Hip Abduction  - 1 x daily - 7 x weekly - 1 sets - 10 reps - 2-3 hold - Seated Long Arc Quad  - 1 x daily - 7 x weekly - 1 sets - 10 reps - 2-3 hold - Seated Heel Toe Raises  - 1 x daily - 7 x weekly - 1 sets - 10 reps - 2-3 hold - Seated Toe Curl  - 1 x daily - 7 x weekly - 1 sets - 10 reps - 2-3 hold  ASSESSMENT:  CLINICAL IMPRESSION:  Pt made significant decrease in edema visit 1-10; visit 11-20 demonstrates overall decrease in volume B but much less dramatically than the first ten treatments.  We will measure on Wednesday and if pt has not made any progress we will setting a discharge date.  PT states [facility] [employer] is waiting on shipping his juxtafit due to insurance reasons.     Will continue total decongestive techniques to further reduce indurated areas.      OBJECTIVE IMPAIRMENTS: difficulty walking, increased edema, pain, and decreased skin integrity .   ACTIVITY LIMITATIONS: dressing, hygiene/grooming, and locomotion level  REHAB POTENTIAL: Good  CLINICAL  DECISION MAKING: Evolving/moderate complexity  EVALUATION COMPLEXITY: Moderate  GOALS: Goals reviewed with patient? No  SHORT TERM GOALS: Target date: 07/07/24  Pt to be I in HEP to improve lymphatic circulation  Baseline: Goal status: met   2.  Pt to be completing daily skin care to assist in preventing cellulitis  Baseline:  Goal status: met  3.  Pt to have lost 3 cm from Lt LE measurements Baseline:  Goal status: met   LONG TERM GOALS: Target date: 07/28/24  PT to be I in self manual techniques  Baseline:  Goal status: met  2.  Pt to understand that he should be wearing compression wear daily Baseline:  Goal status: met   3.  PT to have lost 4-5 cm from Lt LE daily to assist in preventing cellulitis  Baseline:  Goal status: on-going 4.  PT to have and be using a compression pump to assist in decreasing fluid in his LE                     Goal Status:  on going   PLAN:  PT FREQUENCY: 3x/week  PT DURATION: 4 weeks  Continue for 3 more weeks to achieve all goals total of 6 weeks.   PLANNED INTERVENTIONS: 97110-Therapeutic exercises, 97535- Self Care, and 02859- Manual therapy  PLAN FOR NEXT SESSION:  Continue total decongestive techniques.  Measure Wednesdays.  Montie Metro, PT CLT [phone]  08/01/2024, 11:32 AM

## 2024-08-04 ENCOUNTER — Encounter (HOSPITAL_COMMUNITY)

## 2024-08-04 ENCOUNTER — Ambulatory Visit (HOSPITAL_COMMUNITY)

## 2024-08-04 ENCOUNTER — Encounter (HOSPITAL_COMMUNITY): Payer: Self-pay

## 2024-08-04 DIAGNOSIS — R600 Localized edema: Secondary | ICD-10-CM | POA: Diagnosis not present

## 2024-08-04 DIAGNOSIS — N189 Chronic kidney disease, unspecified: Secondary | ICD-10-CM | POA: Diagnosis not present

## 2024-08-04 DIAGNOSIS — M79672 Pain in left foot: Secondary | ICD-10-CM | POA: Diagnosis not present

## 2024-08-04 DIAGNOSIS — M6281 Muscle weakness (generalized): Secondary | ICD-10-CM | POA: Diagnosis not present

## 2024-08-04 DIAGNOSIS — R262 Difficulty in walking, not elsewhere classified: Secondary | ICD-10-CM | POA: Diagnosis not present

## 2024-08-04 DIAGNOSIS — I890 Lymphedema, not elsewhere classified: Secondary | ICD-10-CM

## 2024-08-04 DIAGNOSIS — Z79899 Other long term (current) drug therapy: Secondary | ICD-10-CM | POA: Diagnosis not present

## 2024-08-04 NOTE — Therapy (Signed)
 OUTPATIENT PHYSICAL THERAPY LYMPHEDEMA Treatment    Patient Name: Devin [name] MRN: [id number] DOB:[birth year], 78 y.o., male Today's Date: 08/04/2024  END OF SESSION:  PT End of Session - 08/04/24 1017     Visit Number 21    Number of Visits 28    Date for Recertification  08/20/24    Authorization Type medicare    Progress Note Due on Visit 28   PN complete visit #18   PT Start Time 0921    PT Stop Time 1015    PT Time Calculation (min) 54 min    Activity Tolerance Patient tolerated treatment well    Behavior During Therapy [facility] for tasks assessed/performed                   Past Medical History:  Diagnosis Date   Arthritis    Asbestosis (HCC)    CAD (coronary artery disease)    Multivessel status post CABG March 2016 - LIMA to LAD, SVG to OM1, SVG to OM2, and SVG to PDA.   Essential hypertension    GERD (gastroesophageal reflux disease)    Melanoma of back (HCC)    Prostatic hypertrophy    Sleep apnea    Intolerant of CPAP   Past Surgical History:  Procedure Laterality Date   ANTERIOR CERVICAL DECOMP/DISCECTOMY FUSION     CARDIOVERSION N/A 01/28/2015   Procedure: CARDIOVERSION;  Surgeon: Dorn JULIANNA Ross, MD;  Location: [facility];  Service: Endoscopy;  Laterality: N/A;   CHOLECYSTECTOMY N/A 04/07/2020   Procedure: LAPAROSCOPIC CHOLECYSTECTOMY;  Surgeon: Kallie Manuelita BROCKS, MD;  Location: [facility];  Service: General;  Laterality: N/A;   CORONARY ARTERY BYPASS GRAFT N/A 12/29/2014   Procedure: CORONARY ARTERY BYPASS GRAFTING (CABG), ON PUMP, TIMES FOUR, USING LEFT INTERNAL MAMMARY ARTERY, RIGHT GREATER SAPHENOUS VEIN HARVESTED ENDOSCOPICALLY;  Surgeon: Maude Fleeta Ochoa, MD;  Location: [facility] OR;  Service: Open Heart Surgery;  Laterality: N/A;   KNEE ARTHROSCOPY Right    KNEE CARTILAGE SURGERY Left    LEFT HEART CATHETERIZATION WITH CORONARY ANGIOGRAM N/A 12/28/2014   Procedure: LEFT HEART CATHETERIZATION WITH CORONARY ANGIOGRAM;  Surgeon: Dorn JINNY Lesches, MD;   Location: [facility] CATH LAB;  Service: Cardiovascular;  Laterality: N/A;   MELANOMA EXCISION     back   SHOULDER OPEN ROTATOR CUFF REPAIR Left    TEE WITHOUT CARDIOVERSION N/A 12/29/2014   Procedure: TRANSESOPHAGEAL ECHOCARDIOGRAM (TEE);  Surgeon: Maude Fleeta Ochoa, MD;  Location: [facility] OR;  Service: Open Heart Surgery;  Laterality: N/A;   TEE WITHOUT CARDIOVERSION N/A 01/28/2015   Procedure: TRANSESOPHAGEAL ECHOCARDIOGRAM (TEE);  Surgeon: Dorn JULIANNA Ross, MD;  Location: [facility];  Service: Endoscopy;  Laterality: N/A;   Patient Active Problem List   Diagnosis Date Noted   Cough variant asthma vs UACS  02/17/2022   DOE (dyspnea on exertion) 12/07/2020   Calculus of gallbladder with acute cholecystitis without obstruction 04/06/2020   Hypogonadism in male 10/17/2019   Nocturia 10/17/2019   Erectile dysfunction due to arterial insufficiency 10/17/2019   Atrial fibrillation and flutter (HCC) 01/23/2015   Palpitations 01/23/2015   Hyperlipidemia 01/23/2015   BPH with urinary obstruction 01/23/2015   Chronic diastolic CHF (congestive heart failure) /HFpEF 01/23/2015   Atrial fibrillation with RVR (HCC) 01/23/2015   Obstructive sleep apnea--- refuses CPAP 01/18/2015   Essential hypertension 01/18/2015   Leg edema 01/18/2015   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   CAD (coronary artery disease), native coronary artery/H/o CABG x 4 12/26/2014    PCP: Sheryle,  Gaither, MD  REFERRING PROVIDER: Sheryle Gaither, MD  REFERRING DIAG:  Free Text Diagnosis  right leg cellulitis    THERAPY DIAG:  Edema most likely lymphedema causing cellulitis in both LE   Rationale for Evaluation and Treatment: Rehabilitation  ONSET DATE: chronic at least  since 2016  SUBJECTIVE:                                                                                                                                                                                           SUBJECTIVE STATEMENT:  Reports discomfort on thigh and Lt ankle with  compression garments.  Waiting to hear about the juxtafit to arrive.  Has been riding pump when out of dressings and exercising regularly.  No pain currently, does have occasional Lt ankle pain.  Reports he can walk further now days.  PERTINENT HISTORY: leg edema, CAD,  PAIN:  Are you having pain? No not in legs but in jt as pt has OA we will not address this.   NPRS scale: 0-/10    PRECAUTIONS: Other: cellulitis   RED FLAGS: None   WEIGHT BEARING RESTRICTIONS: No  FALLS:  Has patient fallen in last 6 months? No  LIVING ENVIRONMENT: Lives with: lives with their spouse Lives in: House/apartment OCCUPATION: retired   LEISURE: walking   PRIOR LEVEL OF FUNCTION: Independent  PATIENT GOALS: less swelling no infection    OBJECTIVE: Note: Objective measures were completed at Evaluation unless otherwise noted.  COGNITION: Overall cognitive status: Within functional limits for tasks assessed   PALPATION: Noted induration   OBSERVATIONS / OTHER ASSESSMENTS: noted erythema ,positive stemmer sign   LYMPHEDEMA ASSESSMENTS:    LE LANDMARK RIGHT eval 06/25/24 07/02/24 07/09/24 07/16/24 07/23/24 07/30/24  At groin         30 cm proximal to suprapatella         20 cm proximal to suprapatella 67 65.3 65.3  65.7  66.5 65 63  10 cm proximal to suprapatella 57.8 57 55 55.7 54.8 53.5 53.5  At midpatella / popliteal crease 49.3 50.3 49 49 50.5 50.5 48.4  30 cm proximal to floor at lateral plantar foot 55 54.5 48 47.5 48.3 49 47.7  20 cm proximal to floor at lateral plantar foot 47.3 47.3 37.5 37 36.8 37 37.6  10 cm proximal to floor at lateral plantar foot 36.8 35.8 30.8 33 32.5 31 30   Circumference of ankle/heel 40.9 40.3 39.3 39.3 39.8 38 38.6  5 cm proximal to 1st MTP joint 29.7 30 30.5 28.4 29.7 29 28.4  Across MTP joint 28.9 28.8 29 28  28.2 27.3 27.3  Around proximal great  toe 11 11 10.5 10.1 10.5 9.3 9.7  (Blank rows = not tested)  Leg length:  42cm, foot length 29cm  LE  LANDMARK LEFT eval 06/25/24 07/02/24 07/09/24 07/16/24 07/23/24 07/30/24  At groin         30 cm proximal to suprapatella         20 cm proximal to suprapatella 64.8 64.8 62.8 63.5 65 62 63  10 cm proximal to suprapatella 58.7 58.7 56.5 56.7 57.8 55 56.3  At midpatella / popliteal crease 52.4 48.7 49.8 49 51.5 48.5 49.7  30 cm proximal to floor at lateral plantar foot 53.6 53.5 46.5 46.8 47.2 46.5 46.8  20 cm proximal to floor at lateral plantar foot 46.7 47 38 35 36 37 37.3  10 cm proximal to floor at lateral plantar foot 38.4 36.7 32 32.5 32.4 29 30.3  Circumference of ankle/heel 40.5 36.4 38.2 38.8 38.3 38.3 37.8  5 cm proximal to 1st MTP joint 29.4 31.5 31 29.4 29.4 29.4 28.3  Across MTP joint 28.6 29.5 29.5 28.6 29.7 27 27.2  Around proximal great toe 11.6 11.8 11.5 11.5 11.3 10.3 10.6  (Blank rows = not tested) leg length 44 cm   FUNCTIONAL TESTS:  30 seconds chair stand test:  9 average for age is 11-15 2 minute walk test: 348 ft   TODAY'S TREATMENT:                                                                                                                              DATE:  08/04/24: Manual decongestive techniques to include: supraclavicular, short and deep abdominal, inguinal/axillary anastomosis and B LE.  Posterior completed side lying    Application of  lotion, soft netting , multilayer short stretch bandages with 1/2in foam B foot to knee.  08/01/24: Manual decongestive techniques to include: supraclavicular, short and deep abdominal, inguinal/axillary anastomosis and B LE.  Posterior completed side lying    Application of  lotion, soft netting , multilayer short stretch bandages with 1/2in foam B foot to knee.   Education details: 07/14/24:  Explained that if pt goes even an hour without compression, edema will begin to build up.  Therapist demonstrated donning compression garment with and without a butler.  Told pt where they could buy a butler from if they so desired as pt  and wife appeared to be interested in the butler.  07/02/24:  compression bandaging using 1/2 foam and multiple short stretch bandages.    06/18/24:  self manual techniques  06/16/24 : HEP, what lymphedema is and how it is controlled,(four aspects to treatment) Person educated: Patient Education method: Explanation, Demonstration, Verbal cues, and Handouts Education comprehension: verbalized understanding  HOME EXERCISE PROGRAM: Access Code: [email or web address] URL: [email or web address] Date: 06/16/2024 Prepared by: Montie Metro  Exercises - Seated Diaphragmatic Breathing  - 1 x daily - 7 x weekly - 10 reps - 5 hold - Seated Cervical Sidebending AROM  - 1 x  daily - 7 x weekly - 1 sets - 10 reps - 2-3 hold - Seated Cervical Rotation AROM  - 1 x daily - 7 x weekly - 1 sets - 10 reps - 2-3 hold - Seated Cervical Extension AROM  - 1 x daily - 7 x weekly - 1 sets - 10 reps - 2-3 hold - Seated Cervical Retraction  - 1 x daily - 7 x weekly - 1 sets - 10 reps - 2-3 hold - Shoulder Rolls in Sitting  - 1 x daily - 7 x weekly - 1 sets - 10 reps - 2-3 hold - Seated Sidebending Arms Overhead  - 1 x daily - 7 x weekly - 1 sets - 10 reps - 2-3 hold - Seated Hip Abduction  - 1 x daily - 7 x weekly - 1 sets - 10 reps - 2-3 hold - Seated Long Arc Quad  - 1 x daily - 7 x weekly - 1 sets - 10 reps - 2-3 hold - Seated Heel Toe Raises  - 1 x daily - 7 x weekly - 1 sets - 10 reps - 2-3 hold - Seated Toe Curl  - 1 x daily - 7 x weekly - 1 sets - 10 reps - 2-3 hold  ASSESSMENT:  CLINICAL IMPRESSION:   Manual decongestive techniques complete anterior and posterior for ingineal to axillary anastomosis.  Induration is still present distal extremities though has reduced since beginning therapy.  Applied benadryl to address areas of itch prior application of short stretch bandages with 1/2 in foam foot to Bil knees.  Pt reports discomfort with compression  garments, waiting to hear from [facility]  concerning juxtafit.  OBJECTIVE IMPAIRMENTS: difficulty walking, increased edema, pain, and decreased skin integrity .   ACTIVITY LIMITATIONS: dressing, hygiene/grooming, and locomotion level  REHAB POTENTIAL: Good  CLINICAL DECISION MAKING: Evolving/moderate complexity  EVALUATION COMPLEXITY: Moderate  GOALS: Goals reviewed with patient? No  SHORT TERM GOALS: Target date: 07/07/24  Pt to be I in HEP to improve lymphatic circulation  Baseline: Goal status: met   2.  Pt to be completing daily skin care to assist in preventing cellulitis  Baseline:  Goal status: met  3.  Pt to have lost 3 cm from Lt LE measurements Baseline:  Goal status: met   LONG TERM GOALS: Target date: 07/28/24  PT to be I in self manual techniques  Baseline:  Goal status: met  2.  Pt to understand that he should be wearing compression wear daily Baseline:  Goal status: met   3.  PT to have lost 4-5 cm from Lt LE daily to assist in preventing cellulitis  Baseline:  Goal status: on-going 4.  PT to have and be using a compression pump to assist in decreasing fluid in his LE                     Goal Status:  on going   PLAN:  PT FREQUENCY: 3x/week  PT DURATION: 4 weeks  Continue for 3 more weeks to achieve all goals total of 6 weeks.   PLANNED INTERVENTIONS: 97110-Therapeutic exercises, 97535- Self Care, and 02859- Manual therapy  PLAN FOR NEXT SESSION:  Continue total decongestive techniques.  Measure Wednesdays.   Augustin Mclean, LPTA/CLT; [facility] [phone]  08/04/2024, 11:40 AM

## 2024-08-06 ENCOUNTER — Ambulatory Visit (HOSPITAL_COMMUNITY): Admitting: Physical Therapy

## 2024-08-06 DIAGNOSIS — R262 Difficulty in walking, not elsewhere classified: Secondary | ICD-10-CM | POA: Diagnosis not present

## 2024-08-06 DIAGNOSIS — M79672 Pain in left foot: Secondary | ICD-10-CM

## 2024-08-06 DIAGNOSIS — I890 Lymphedema, not elsewhere classified: Secondary | ICD-10-CM | POA: Diagnosis not present

## 2024-08-06 DIAGNOSIS — R600 Localized edema: Secondary | ICD-10-CM | POA: Diagnosis not present

## 2024-08-06 DIAGNOSIS — M6281 Muscle weakness (generalized): Secondary | ICD-10-CM | POA: Diagnosis not present

## 2024-08-06 NOTE — Therapy (Addendum)
 OUTPATIENT PHYSICAL THERAPY LYMPHEDEMA Treatment/Discharge     Patient Name: Devin [name] MRN: [id number] DOB:[birth year], 78 y.o., male Today's Date: 08/06/2024 PHYSICAL THERAPY DISCHARGE SUMMARY  Visits from Start of Care: 22  Current functional level related to goals / functional outcomes: See below    Remaining deficits: edema   Education / Equipment: Donning compression garments, wearing of compression garments, use of compression pump and exercises.    Patient agrees to discharge. Patient goals were partially met. Patient is being discharged due to maximized rehab potential.   END OF SESSION:  PT End of Session - 08/06/24 0927     Visit Number 22    Number of Visits 28    Date for Recertification  08/20/24    Authorization Type medicare    Authorization - Number of Visits 22    Progress Note Due on Visit 22   PN complete visit #18   PT Start Time 0815    PT Stop Time 0920    PT Time Calculation (min) 65 min    Activity Tolerance Patient tolerated treatment well    Behavior During Therapy [facility] for tasks assessed/performed                    Past Medical History:  Diagnosis Date   Arthritis    Asbestosis (HCC)    CAD (coronary artery disease)    Multivessel status post CABG March 2016 - LIMA to LAD, SVG to OM1, SVG to OM2, and SVG to PDA.   Essential hypertension    GERD (gastroesophageal reflux disease)    Melanoma of back (HCC)    Prostatic hypertrophy    Sleep apnea    Intolerant of CPAP   Past Surgical History:  Procedure Laterality Date   ANTERIOR CERVICAL DECOMP/DISCECTOMY FUSION     CARDIOVERSION N/A 01/28/2015   Procedure: CARDIOVERSION;  Surgeon: Dorn JULIANNA Ross, MD;  Location: [facility];  Service: Endoscopy;  Laterality: N/A;   CHOLECYSTECTOMY N/A 04/07/2020   Procedure: LAPAROSCOPIC CHOLECYSTECTOMY;  Surgeon: Kallie Manuelita BROCKS, MD;  Location: [facility];  Service: General;  Laterality: N/A;   CORONARY ARTERY BYPASS GRAFT N/A 12/29/2014    Procedure: CORONARY ARTERY BYPASS GRAFTING (CABG), ON PUMP, TIMES FOUR, USING LEFT INTERNAL MAMMARY ARTERY, RIGHT GREATER SAPHENOUS VEIN HARVESTED ENDOSCOPICALLY;  Surgeon: Maude Fleeta Ochoa, MD;  Location: [facility] OR;  Service: Open Heart Surgery;  Laterality: N/A;   KNEE ARTHROSCOPY Right    KNEE CARTILAGE SURGERY Left    LEFT HEART CATHETERIZATION WITH CORONARY ANGIOGRAM N/A 12/28/2014   Procedure: LEFT HEART CATHETERIZATION WITH CORONARY ANGIOGRAM;  Surgeon: Dorn JINNY Lesches, MD;  Location: [facility] CATH LAB;  Service: Cardiovascular;  Laterality: N/A;   MELANOMA EXCISION     back   SHOULDER OPEN ROTATOR CUFF REPAIR Left    TEE WITHOUT CARDIOVERSION N/A 12/29/2014   Procedure: TRANSESOPHAGEAL ECHOCARDIOGRAM (TEE);  Surgeon: Maude Fleeta Ochoa, MD;  Location: [facility] OR;  Service: Open Heart Surgery;  Laterality: N/A;   TEE WITHOUT CARDIOVERSION N/A 01/28/2015   Procedure: TRANSESOPHAGEAL ECHOCARDIOGRAM (TEE);  Surgeon: Dorn JULIANNA Ross, MD;  Location: [facility];  Service: Endoscopy;  Laterality: N/A;   Patient Active Problem List   Diagnosis Date Noted   Cough variant asthma vs UACS  02/17/2022   DOE (dyspnea on exertion) 12/07/2020   Calculus of gallbladder with acute cholecystitis without obstruction 04/06/2020   Hypogonadism in male 10/17/2019   Nocturia 10/17/2019   Erectile dysfunction due to arterial insufficiency 10/17/2019   Atrial fibrillation  and flutter (HCC) 01/23/2015   Palpitations 01/23/2015   Hyperlipidemia 01/23/2015   BPH with urinary obstruction 01/23/2015   Chronic diastolic CHF (congestive heart failure) /HFpEF 01/23/2015   Atrial fibrillation with RVR (HCC) 01/23/2015   Obstructive sleep apnea--- refuses CPAP 01/18/2015   Essential hypertension 01/18/2015   Leg edema 01/18/2015   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   CAD (coronary artery disease), native coronary artery/H/o CABG x 4 12/26/2014    PCP: Sheryle Carwin, MD  REFERRING PROVIDER: Sheryle Carwin, MD  REFERRING DIAG:  Free Text Diagnosis   right leg cellulitis    THERAPY DIAG:  Edema most likely lymphedema causing cellulitis in both LE   Rationale for Evaluation and Treatment: Rehabilitation  ONSET DATE: chronic at least  since 2016  SUBJECTIVE:                                                                                                                                                                                           SUBJECTIVE STATEMENT:   Pt states he got his Towana but he can not figure out how to use them.     PERTINENT HISTORY: leg edema, CAD,  PAIN:  Are you having pain? No not in legs but in jt as pt has OA we will not address this.   NPRS scale: 0-/10    PRECAUTIONS: Other: cellulitis   RED FLAGS: None   WEIGHT BEARING RESTRICTIONS: No  FALLS:  Has patient fallen in last 6 months? No  LIVING ENVIRONMENT: Lives with: lives with their spouse Lives in: House/apartment OCCUPATION: retired   LEISURE: walking   PRIOR LEVEL OF FUNCTION: Independent  PATIENT GOALS: less swelling no infection    OBJECTIVE: Note: Objective measures were completed at Evaluation unless otherwise noted.  COGNITION: Overall cognitive status: Within functional limits for tasks assessed   PALPATION: Noted induration   OBSERVATIONS / OTHER ASSESSMENTS: noted erythema ,positive stemmer sign   LYMPHEDEMA ASSESSMENTS:    LE LANDMARK RIGHT eval 06/25/24 07/02/24 07/09/24 07/16/24 07/23/24 07/30/24 08/06/24  At groin          30 cm proximal to suprapatella          20 cm proximal to suprapatella 67 65.3 65.3  65.7  66.5 65 63 65  10 cm proximal to suprapatella 57.8 57 55 55.7 54.8 53.5 53.5 54.5  At midpatella / popliteal crease 49.3 50.3 49 49 50.5 50.5 48.4 51.6  30 cm proximal to floor at lateral plantar foot 55 54.5 48 47.5 48.3 49 47.7 48  20 cm proximal to floor at lateral plantar foot 47.3 47.3 37.5 37 36.8 37 37.6  37.3  10 cm proximal to floor at lateral plantar foot 36.8 35.8 30.8 33 32.5 31 30  31.2   Circumference of ankle/heel 40.9 40.3 39.3 39.3 39.8 38 38.6 39.3  5 cm proximal to 1st MTP joint 29.7 30 30.5 28.4 29.7 29 28.4 28.2  Across MTP joint 28.9 28.8 29 28  28.2 27.3 27.3 27.5  Around proximal great toe 11 11 10.5 10.1 10.5 9.3 9.7 9.9  (Blank rows = not tested)  Leg length:  42cm, foot length 29cm  LE LANDMARK LEFT eval 06/25/24 07/02/24 07/09/24 07/16/24 07/23/24 07/30/24 08/06/24  At groin          30 cm proximal to suprapatella          20 cm proximal to suprapatella 64.8 64.8 62.8 63.5 65 62 63 63.5  10 cm proximal to suprapatella 58.7 58.7 56.5 56.7 57.8 55 56.3 57  At midpatella / popliteal crease 52.4 48.7 49.8 49 51.5 48.5 49.7 51.5  30 cm proximal to floor at lateral plantar foot 53.6 53.5 46.5 46.8 47.2 46.5 46.8 46.8  20 cm proximal to floor at lateral plantar foot 46.7 47 38 35 36 37 37.3 36.5  10 cm proximal to floor at lateral plantar foot 38.4 36.7 32 32.5 32.4 29 30.3 31.2  Circumference of ankle/heel 40.5 36.4 38.2 38.8 38.3 38.3 37.8 38.3  5 cm proximal to 1st MTP joint 29.4 31.5 31 29.4 29.4 29.4 28.3 28.6  Across MTP joint 28.6 29.5 29.5 28.6 29.7 27 27.2 27.8  Around proximal great toe 11.6 11.8 11.5 11.5 11.3 10.3 10.6 10.9  (Blank rows = not tested) leg length 44 cm   FUNCTIONAL TESTS:  30 seconds chair stand test:  9 average for age is 11-15 08/06/24:  12 2 minute walk test: 348 ft  08/06/24:  432 ft   TODAY'S TREATMENT:                                                                                                                              DATE:  08/06/24:measurement Instruction on how to use Towana to don thigh high compression garments   Manual decongestive techniques to include: supraclavicular, short and deep abdominal, inguinal/axillary anastomosis and B LE.  Posterior completed side lying.    Discussion that pt seems to have plateau and is ready for discharge at this time.   Education details: 08/06/24:  Instruction on how to use Towana to don  thigh high compression garments.  Wear compression garments everyday, use compression pump one to two times a day.   07/14/24:  Explained that if pt goes even an hour without compression, edema will begin to build up.  Therapist demonstrated donning compression garment with and without a butler.  Told pt where they could buy a butler from if they so desired as pt and wife appeared to be interested in the butler.  07/02/24:  compression bandaging using 1/2 foam and multiple short stretch bandages.  06/18/24:  self manual techniques  06/16/24 : HEP, what lymphedema is and how it is controlled,(four aspects to treatment) Person educated: Patient Education method: Explanation, Demonstration, Verbal cues, and Handouts Education comprehension: verbalized understanding  HOME EXERCISE PROGRAM: Access Code: [email or web address] URL: [email or web address] Date: 06/16/2024 Prepared by: Montie Metro  Exercises - Seated Diaphragmatic Breathing  - 1 x daily - 7 x weekly - 10 reps - 5 hold - Seated Cervical Sidebending AROM  - 1 x daily - 7 x weekly - 1 sets - 10 reps - 2-3 hold - Seated Cervical Rotation AROM  - 1 x daily - 7 x weekly - 1 sets - 10 reps - 2-3 hold - Seated Cervical Extension AROM  - 1 x daily - 7 x weekly - 1 sets - 10 reps - 2-3 hold - Seated Cervical Retraction  - 1 x daily - 7 x weekly - 1 sets - 10 reps - 2-3 hold - Shoulder Rolls in Sitting  - 1 x daily - 7 x weekly - 1 sets - 10 reps - 2-3 hold - Seated Sidebending Arms Overhead  - 1 x daily - 7 x weekly - 1 sets - 10 reps - 2-3 hold - Seated Hip Abduction  - 1 x daily - 7 x weekly - 1 sets - 10 reps - 2-3 hold - Seated Long Arc Quad  - 1 x daily - 7 x weekly - 1 sets - 10 reps - 2-3 hold - Seated Heel Toe Raises  - 1 x daily - 7 x weekly - 1 sets - 10 reps - 2-3 hold - Seated Toe Curl  - 1 x daily - 7 x weekly - 1 sets - 10 reps - 2-3 hold  ASSESSMENT:  CLINICAL IMPRESSION:  Measurement:    Pt has not changed  volume significantly in two weeks therefore he will be discharged.  PT functional measurement taken with good improvement.  PT is I in donning compression garment with butler, and is using his pump at least once a day when he is not bandaged.  Therapist recommended two times a day when able.   OBJECTIVE IMPAIRMENTS: difficulty walking, increased edema, pain, and decreased skin integrity .   ACTIVITY LIMITATIONS: dressing, hygiene/grooming, and locomotion level  REHAB POTENTIAL: Good  CLINICAL DECISION MAKING: Evolving/moderate complexity  EVALUATION COMPLEXITY: Moderate  GOALS: Goals reviewed with patient? No  SHORT TERM GOALS: Target date: 07/07/24  Pt to be I in HEP to improve lymphatic circulation  Baseline: Goal status: met   2.  Pt to be completing daily skin care to assist in preventing cellulitis  Baseline:  Goal status: met  3.  Pt to have lost 3 cm from Lt LE measurements Baseline:  Goal status: met   LONG TERM GOALS: Target date: 07/28/24  PT to be I in self manual techniques  Baseline:  Goal status: met  2.  Pt to understand that he should be wearing compression wear daily Baseline:  Goal status: met   3.  PT to have lost 4-5 cm from Lt LE daily to assist in preventing cellulitis  Baseline:  Goal status: on-going 4.  PT to have and be using a compression pump to assist in decreasing fluid in his LE                     Goal Status:  met   PLAN:  PT FREQUENCY: 3x/week  PT DURATION: 4 weeks  Continue for 3 more weeks  to achieve all goals total of 6 weeks.   PLANNED INTERVENTIONS: 97110-Therapeutic exercises, 97535- Self Care, and 02859- Manual therapy  PLAN FOR NEXT SESSION:  discharge.  Montie Metro, PT CLT [phone]  08/06/2024, 9:27 AM

## 2024-08-07 ENCOUNTER — Encounter (HOSPITAL_COMMUNITY): Admitting: Physical Therapy

## 2024-08-08 ENCOUNTER — Encounter (HOSPITAL_COMMUNITY): Admitting: Physical Therapy

## 2024-08-11 DIAGNOSIS — Z23 Encounter for immunization: Secondary | ICD-10-CM | POA: Diagnosis not present

## 2024-08-11 DIAGNOSIS — R600 Localized edema: Secondary | ICD-10-CM | POA: Diagnosis not present

## 2024-08-11 DIAGNOSIS — R051 Acute cough: Secondary | ICD-10-CM | POA: Diagnosis not present

## 2024-08-11 DIAGNOSIS — I10 Essential (primary) hypertension: Secondary | ICD-10-CM | POA: Diagnosis not present

## 2024-08-12 ENCOUNTER — Encounter (HOSPITAL_COMMUNITY): Admitting: Physical Therapy

## 2024-08-13 ENCOUNTER — Encounter (HOSPITAL_COMMUNITY): Admitting: Physical Therapy

## 2024-08-14 ENCOUNTER — Encounter (HOSPITAL_COMMUNITY): Admitting: Physical Therapy

## 2024-08-15 ENCOUNTER — Encounter (HOSPITAL_COMMUNITY): Admitting: Physical Therapy

## 2024-08-18 ENCOUNTER — Other Ambulatory Visit: Payer: Self-pay | Admitting: Urology

## 2024-08-18 DIAGNOSIS — R350 Frequency of micturition: Secondary | ICD-10-CM

## 2024-08-19 ENCOUNTER — Encounter (HOSPITAL_COMMUNITY): Admitting: Physical Therapy

## 2024-08-20 ENCOUNTER — Encounter (HOSPITAL_COMMUNITY): Admitting: Physical Therapy

## 2024-08-20 ENCOUNTER — Other Ambulatory Visit: Payer: Self-pay

## 2024-08-20 ENCOUNTER — Telehealth: Payer: Self-pay | Admitting: Urology

## 2024-08-20 DIAGNOSIS — R350 Frequency of micturition: Secondary | ICD-10-CM

## 2024-08-20 MED ORDER — DOXAZOSIN MESYLATE 4 MG PO TABS: 4.0000 mg | ORAL_TABLET | Freq: Every day | ORAL | 11 refills | Status: AC

## 2024-08-20 NOTE — Telephone Encounter (Signed)
 Called and lvm for pt that rx has been sent in

## 2024-08-20 NOTE — Telephone Encounter (Signed)
 The patient called to request a medication refill of Doxazosin . Patient would like the medication sent to [facility] pharmacy.

## 2024-08-20 NOTE — Telephone Encounter (Signed)
 Called pt to let him know a Rx refill request was sent to MD for approval unable to lvm

## 2024-08-22 ENCOUNTER — Encounter (HOSPITAL_COMMUNITY): Admitting: Physical Therapy

## 2024-08-22 ENCOUNTER — Telehealth: Payer: Self-pay

## 2024-08-22 NOTE — Telephone Encounter (Signed)
 Opened in error

## 2024-08-26 ENCOUNTER — Encounter (HOSPITAL_COMMUNITY): Admitting: Physical Therapy

## 2024-08-27 ENCOUNTER — Encounter (HOSPITAL_COMMUNITY): Admitting: Physical Therapy

## 2024-08-29 ENCOUNTER — Encounter (HOSPITAL_COMMUNITY): Admitting: Physical Therapy

## 2024-09-30 ENCOUNTER — Other Ambulatory Visit: Payer: Self-pay | Admitting: Urology

## 2024-10-06 ENCOUNTER — Other Ambulatory Visit: Payer: Self-pay

## 2024-10-06 NOTE — Telephone Encounter (Signed)
 Refill request sent to provider.

## 2024-10-24 ENCOUNTER — Other Ambulatory Visit: Payer: Self-pay | Admitting: Urology

## 2024-11-05 ENCOUNTER — Other Ambulatory Visit: Payer: Self-pay | Admitting: Urology

## 2024-11-12 ENCOUNTER — Telehealth: Payer: Self-pay

## 2024-11-12 NOTE — Telephone Encounter (Signed)
 Pt pharmacy called about testosterone  refill of 200 mg per 1 ml.

## 2024-11-16 ENCOUNTER — Other Ambulatory Visit: Payer: Self-pay | Admitting: Urology

## 2024-11-16 DIAGNOSIS — R350 Frequency of micturition: Secondary | ICD-10-CM

## 2024-11-17 MED ORDER — TESTOSTERONE CYPIONATE 200 MG/ML IM SOLN: 400.0000 mg | INTRAMUSCULAR | 3 refills | Status: AC

## 2024-11-17 NOTE — Addendum Note (Signed)
 Addended by: NIEVES COUGH R on: 11/17/2024 09:20 AM   Modules accepted: Orders

## 2024-11-20 ENCOUNTER — Telehealth: Payer: Self-pay | Admitting: Urology

## 2024-11-20 NOTE — Telephone Encounter (Signed)
 The patient called to request a medication refill of doxazosin  . Patient would like the medication sent to [facility].

## 2024-11-21 NOTE — Telephone Encounter (Signed)
 Called patient and left message that he needs to contact his pharmacy for his refills

## 2024-11-25 ENCOUNTER — Telehealth: Payer: Self-pay

## 2024-11-25 NOTE — Telephone Encounter (Signed)
 Patient wife called to check the status of refills on patients doxazosin  patients wife was advised Rx was sent in October with 11 refill to check with [facility] concerning refill patient wife voiced her understanding stating she would call pharmacy

## 2024-12-05 ENCOUNTER — Encounter: Payer: Self-pay | Admitting: Internal Medicine

## 2025-01-19 ENCOUNTER — Other Ambulatory Visit

## 2025-02-02 ENCOUNTER — Ambulatory Visit: Admitting: Urology
